# Patient Record
Sex: Female | Born: 1965
Health system: Southern US, Community
[De-identification: ages and names within clinical notes are randomized; demographics above are authoritative.]

## PROBLEM LIST (undated history)

## (undated) DIAGNOSIS — I10 Essential (primary) hypertension: Secondary | ICD-10-CM

## (undated) DIAGNOSIS — I639 Cerebral infarction, unspecified: Secondary | ICD-10-CM

## (undated) DIAGNOSIS — R0600 Dyspnea, unspecified: Secondary | ICD-10-CM

## (undated) DIAGNOSIS — M722 Plantar fascial fibromatosis: Secondary | ICD-10-CM

## (undated) DIAGNOSIS — R112 Nausea with vomiting, unspecified: Secondary | ICD-10-CM

## (undated) DIAGNOSIS — Z9889 Other specified postprocedural states: Secondary | ICD-10-CM

## (undated) DIAGNOSIS — R7303 Prediabetes: Secondary | ICD-10-CM

## (undated) DIAGNOSIS — Z227 Latent tuberculosis: Secondary | ICD-10-CM

## (undated) DIAGNOSIS — D869 Sarcoidosis, unspecified: Secondary | ICD-10-CM

## (undated) HISTORY — DX: Cerebral infarction, unspecified: I63.9

## (undated) HISTORY — PX: ABDOMINAL HYSTERECTOMY: SHX81

---
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
## 1995-08-25 HISTORY — PX: PARTIAL HYSTERECTOMY: SHX80

## 1998-05-22 ENCOUNTER — Other Ambulatory Visit: Admission: RE | Admit: 1998-05-22 | Discharge: 1998-05-22 | Payer: Self-pay | Admitting: Obstetrics

## 1999-09-05 ENCOUNTER — Other Ambulatory Visit: Admission: RE | Admit: 1999-09-05 | Discharge: 1999-09-05 | Payer: Self-pay | Admitting: Gynecology

## 1999-10-31 ENCOUNTER — Encounter (INDEPENDENT_AMBULATORY_CARE_PROVIDER_SITE_OTHER): Payer: Self-pay

## 1999-10-31 ENCOUNTER — Other Ambulatory Visit: Admission: RE | Admit: 1999-10-31 | Discharge: 1999-10-31 | Payer: Self-pay | Admitting: Gynecology

## 1999-12-22 ENCOUNTER — Encounter: Payer: Self-pay | Admitting: Gynecology

## 1999-12-22 ENCOUNTER — Encounter: Admission: RE | Admit: 1999-12-22 | Discharge: 1999-12-22 | Payer: Self-pay | Admitting: Gynecology

## 2000-10-28 ENCOUNTER — Other Ambulatory Visit: Admission: RE | Admit: 2000-10-28 | Discharge: 2000-10-28 | Payer: Self-pay | Admitting: Gynecology

## 2000-12-27 ENCOUNTER — Encounter (INDEPENDENT_AMBULATORY_CARE_PROVIDER_SITE_OTHER): Payer: Self-pay | Admitting: Specialist

## 2000-12-28 ENCOUNTER — Inpatient Hospital Stay (HOSPITAL_COMMUNITY): Admission: RE | Admit: 2000-12-28 | Discharge: 2000-12-29 | Payer: Self-pay | Admitting: Gynecology

## 2001-11-21 ENCOUNTER — Encounter: Payer: Self-pay | Admitting: Emergency Medicine

## 2001-11-21 ENCOUNTER — Emergency Department (HOSPITAL_COMMUNITY): Admission: EM | Admit: 2001-11-21 | Discharge: 2001-11-21 | Payer: Self-pay | Admitting: Emergency Medicine

## 2002-11-19 ENCOUNTER — Emergency Department (HOSPITAL_COMMUNITY): Admission: EM | Admit: 2002-11-19 | Discharge: 2002-11-19 | Payer: Self-pay | Admitting: Emergency Medicine

## 2003-11-05 ENCOUNTER — Emergency Department (HOSPITAL_COMMUNITY): Admission: EM | Admit: 2003-11-05 | Discharge: 2003-11-05 | Payer: Self-pay | Admitting: Family Medicine

## 2003-11-17 ENCOUNTER — Emergency Department (HOSPITAL_COMMUNITY): Admission: AD | Admit: 2003-11-17 | Discharge: 2003-11-17 | Payer: Self-pay | Admitting: Family Medicine

## 2004-06-04 ENCOUNTER — Emergency Department (HOSPITAL_COMMUNITY): Admission: EM | Admit: 2004-06-04 | Discharge: 2004-06-04 | Payer: Self-pay

## 2004-10-20 ENCOUNTER — Emergency Department (HOSPITAL_COMMUNITY): Admission: EM | Admit: 2004-10-20 | Discharge: 2004-10-20 | Payer: Self-pay | Admitting: Family Medicine

## 2005-05-08 ENCOUNTER — Encounter: Admission: RE | Admit: 2005-05-08 | Discharge: 2005-05-08 | Payer: Self-pay | Admitting: Obstetrics and Gynecology

## 2005-11-08 ENCOUNTER — Emergency Department (HOSPITAL_COMMUNITY): Admission: AD | Admit: 2005-11-08 | Discharge: 2005-11-08 | Payer: Self-pay | Admitting: Emergency Medicine

## 2005-11-08 ENCOUNTER — Ambulatory Visit (HOSPITAL_COMMUNITY): Admission: RE | Admit: 2005-11-08 | Discharge: 2005-11-08 | Payer: Self-pay | Admitting: Emergency Medicine

## 2006-04-05 ENCOUNTER — Emergency Department (HOSPITAL_COMMUNITY): Admission: EM | Admit: 2006-04-05 | Discharge: 2006-04-05 | Payer: Self-pay | Admitting: Emergency Medicine

## 2006-09-14 ENCOUNTER — Emergency Department (HOSPITAL_COMMUNITY): Admission: EM | Admit: 2006-09-14 | Discharge: 2006-09-14 | Payer: Self-pay | Admitting: Emergency Medicine

## 2007-03-05 ENCOUNTER — Emergency Department (HOSPITAL_COMMUNITY): Admission: EM | Admit: 2007-03-05 | Discharge: 2007-03-06 | Payer: Self-pay | Admitting: Emergency Medicine

## 2007-04-13 ENCOUNTER — Emergency Department (HOSPITAL_COMMUNITY): Admission: EM | Admit: 2007-04-13 | Discharge: 2007-04-13 | Payer: Self-pay | Admitting: Emergency Medicine

## 2007-04-14 ENCOUNTER — Emergency Department (HOSPITAL_COMMUNITY): Admission: EM | Admit: 2007-04-14 | Discharge: 2007-04-14 | Payer: Self-pay | Admitting: Emergency Medicine

## 2007-04-29 ENCOUNTER — Encounter: Admission: RE | Admit: 2007-04-29 | Discharge: 2007-04-29 | Payer: Self-pay | Admitting: Family Medicine

## 2007-07-13 ENCOUNTER — Emergency Department (HOSPITAL_COMMUNITY): Admission: EM | Admit: 2007-07-13 | Discharge: 2007-07-13 | Payer: Self-pay | Admitting: Family Medicine

## 2007-12-26 ENCOUNTER — Encounter: Admission: RE | Admit: 2007-12-26 | Discharge: 2007-12-26 | Payer: Self-pay | Admitting: Family Medicine

## 2008-01-07 ENCOUNTER — Encounter: Admission: RE | Admit: 2008-01-07 | Discharge: 2008-01-07 | Payer: Self-pay | Admitting: Family Medicine

## 2008-05-31 ENCOUNTER — Emergency Department (HOSPITAL_COMMUNITY): Admission: EM | Admit: 2008-05-31 | Discharge: 2008-05-31 | Payer: Self-pay | Admitting: Internal Medicine

## 2008-08-06 ENCOUNTER — Emergency Department (HOSPITAL_COMMUNITY): Admission: EM | Admit: 2008-08-06 | Discharge: 2008-08-07 | Payer: Self-pay | Admitting: Emergency Medicine

## 2008-09-27 ENCOUNTER — Emergency Department (HOSPITAL_COMMUNITY): Admission: EM | Admit: 2008-09-27 | Discharge: 2008-09-27 | Payer: Self-pay | Admitting: Family Medicine

## 2008-10-03 ENCOUNTER — Emergency Department (HOSPITAL_COMMUNITY): Admission: EM | Admit: 2008-10-03 | Discharge: 2008-10-04 | Payer: Self-pay | Admitting: Emergency Medicine

## 2010-01-09 ENCOUNTER — Emergency Department (HOSPITAL_COMMUNITY): Admission: EM | Admit: 2010-01-09 | Discharge: 2010-01-09 | Payer: Self-pay | Admitting: Emergency Medicine

## 2010-02-27 ENCOUNTER — Other Ambulatory Visit: Admission: RE | Admit: 2010-02-27 | Discharge: 2010-02-27 | Payer: Self-pay | Admitting: Internal Medicine

## 2010-03-26 ENCOUNTER — Ambulatory Visit: Payer: Self-pay | Admitting: Cardiology

## 2010-03-26 ENCOUNTER — Observation Stay (HOSPITAL_COMMUNITY): Admission: EM | Admit: 2010-03-26 | Discharge: 2010-03-27 | Payer: Self-pay | Admitting: Emergency Medicine

## 2010-07-26 ENCOUNTER — Emergency Department (HOSPITAL_COMMUNITY)
Admission: EM | Admit: 2010-07-26 | Discharge: 2010-07-27 | Payer: Self-pay | Source: Home / Self Care | Admitting: Emergency Medicine

## 2010-07-27 ENCOUNTER — Emergency Department (HOSPITAL_COMMUNITY)
Admission: EM | Admit: 2010-07-27 | Discharge: 2010-07-27 | Payer: Self-pay | Source: Home / Self Care | Admitting: Emergency Medicine

## 2010-08-28 ENCOUNTER — Encounter
Admission: RE | Admit: 2010-08-28 | Discharge: 2010-08-28 | Payer: Self-pay | Source: Home / Self Care | Attending: Internal Medicine | Admitting: Internal Medicine

## 2010-09-14 ENCOUNTER — Encounter: Payer: Self-pay | Admitting: Family Medicine

## 2010-09-18 ENCOUNTER — Emergency Department (HOSPITAL_COMMUNITY)
Admission: EM | Admit: 2010-09-18 | Discharge: 2010-09-18 | Payer: Self-pay | Source: Home / Self Care | Admitting: Family Medicine

## 2010-09-18 LAB — POCT URINALYSIS DIPSTICK
Bilirubin Urine: NEGATIVE
Hgb urine dipstick: NEGATIVE
Ketones, ur: NEGATIVE mg/dL
Nitrite: NEGATIVE
Protein, ur: NEGATIVE mg/dL
Specific Gravity, Urine: 1.02 (ref 1.005–1.030)
Urine Glucose, Fasting: NEGATIVE mg/dL
Urobilinogen, UA: 1 mg/dL (ref 0.0–1.0)
pH: 6.5 (ref 5.0–8.0)

## 2010-09-18 LAB — POCT I-STAT, CHEM 8
BUN: 9 mg/dL (ref 6–23)
Calcium, Ion: 1.23 mmol/L (ref 1.12–1.32)
Chloride: 103 mEq/L (ref 96–112)
Creatinine, Ser: 0.8 mg/dL (ref 0.4–1.2)
Glucose, Bld: 95 mg/dL (ref 70–99)
HCT: 45 % (ref 36.0–46.0)
Hemoglobin: 15.3 g/dL — ABNORMAL HIGH (ref 12.0–15.0)
Potassium: 4.3 mEq/L (ref 3.5–5.1)
Sodium: 138 mEq/L (ref 135–145)
TCO2: 26 mmol/L (ref 0–100)

## 2010-09-18 LAB — DIFFERENTIAL
Basophils Absolute: 0 10*3/uL (ref 0.0–0.1)
Basophils Relative: 0 % (ref 0–1)
Eosinophils Absolute: 0 10*3/uL (ref 0.0–0.7)
Eosinophils Relative: 0 % (ref 0–5)
Lymphocytes Relative: 4 % — ABNORMAL LOW (ref 12–46)
Lymphs Abs: 0.5 10*3/uL — ABNORMAL LOW (ref 0.7–4.0)
Monocytes Absolute: 0.4 10*3/uL (ref 0.1–1.0)
Monocytes Relative: 4 % (ref 3–12)
Neutro Abs: 10.3 10*3/uL — ABNORMAL HIGH (ref 1.7–7.7)
Neutrophils Relative %: 92 % — ABNORMAL HIGH (ref 43–77)

## 2010-09-18 LAB — CBC
HCT: 39.9 % (ref 36.0–46.0)
Hemoglobin: 12.8 g/dL (ref 12.0–15.0)
MCH: 22.6 pg — ABNORMAL LOW (ref 26.0–34.0)
MCHC: 32.1 g/dL (ref 30.0–36.0)
MCV: 70.5 fL — ABNORMAL LOW (ref 78.0–100.0)
Platelets: 369 10*3/uL (ref 150–400)
RBC: 5.66 MIL/uL — ABNORMAL HIGH (ref 3.87–5.11)
RDW: 15.3 % (ref 11.5–15.5)
WBC: 11.2 10*3/uL — ABNORMAL HIGH (ref 4.0–10.5)

## 2010-09-19 LAB — GIARDIA/CRYPTOSPORIDIUM SCREEN(EIA)
Cryptosporidium Screen (EIA): NEGATIVE
Giardia Screen - EIA: NEGATIVE

## 2010-09-22 LAB — STOOL CULTURE

## 2010-11-07 LAB — DIFFERENTIAL
Basophils Absolute: 0 10*3/uL (ref 0.0–0.1)
Basophils Relative: 1 % (ref 0–1)
Eosinophils Absolute: 0.2 10*3/uL (ref 0.0–0.7)
Eosinophils Relative: 5 % (ref 0–5)
Lymphocytes Relative: 31 % (ref 12–46)
Lymphs Abs: 1.3 10*3/uL (ref 0.7–4.0)
Monocytes Absolute: 0.5 10*3/uL (ref 0.1–1.0)
Monocytes Relative: 12 % (ref 3–12)
Neutro Abs: 2.1 10*3/uL (ref 1.7–7.7)
Neutrophils Relative %: 52 % (ref 43–77)

## 2010-11-07 LAB — BASIC METABOLIC PANEL
BUN: 7 mg/dL (ref 6–23)
CO2: 27 mEq/L (ref 19–32)
Calcium: 9.2 mg/dL (ref 8.4–10.5)
Chloride: 107 mEq/L (ref 96–112)
Creatinine, Ser: 0.65 mg/dL (ref 0.4–1.2)
GFR calc Af Amer: >60 mL/min (ref >60)
GFR calc non Af Amer: >60 mL/min (ref >60)
Glucose, Bld: 97 mg/dL (ref 70–99)
Potassium: 4.1 mEq/L (ref 3.5–5.1)
Sodium: 138 mEq/L (ref 135–145)

## 2010-11-07 LAB — CARDIAC PANEL(CRET KIN+CKTOT+MB+TROPI)
CK, MB: 0.8 ng/mL (ref 0.3–4.0)
Relative Index: INVALID (ref 0.0–2.5)
Total CK: 46 U/L (ref 7–177)
Troponin I: <0.01 ng/mL (ref 0.00–0.06)

## 2010-11-07 LAB — POCT CARDIAC MARKERS
CKMB, poc: <1 ng/mL — ABNORMAL LOW (ref 1.0–8.0)
CKMB, poc: <1 ng/mL — ABNORMAL LOW (ref 1.0–8.0)
Myoglobin, poc: 54.5 ng/mL (ref 12–200)
Myoglobin, poc: 61 ng/mL (ref 12–200)
Troponin i, poc: <0.05 ng/mL (ref 0.00–0.09)
Troponin i, poc: <0.05 ng/mL (ref 0.00–0.09)

## 2010-11-07 LAB — LIPID PANEL
Cholesterol: 146 mg/dL (ref 0–200)
HDL: 38 mg/dL — ABNORMAL LOW (ref >39)
LDL Cholesterol: 88 mg/dL (ref 0–99)
Total CHOL/HDL Ratio: 3.8 RATIO
Triglycerides: 102 mg/dL (ref <150)
VLDL: 20 mg/dL (ref 0–40)

## 2010-11-07 LAB — D-DIMER, QUANTITATIVE: D-Dimer, Quant: <0.22 ug/mL-FEU (ref 0.00–0.48)

## 2010-11-07 LAB — CBC
HCT: 37.7 % (ref 36.0–46.0)
Hemoglobin: 11.8 g/dL — ABNORMAL LOW (ref 12.0–15.0)
MCH: 22.6 pg — ABNORMAL LOW (ref 26.0–34.0)
MCHC: 31.3 g/dL (ref 30.0–36.0)
MCV: 72.4 fL — ABNORMAL LOW (ref 78.0–100.0)
Platelets: 314 10*3/uL (ref 150–400)
RBC: 5.21 MIL/uL — ABNORMAL HIGH (ref 3.87–5.11)
RDW: 15.3 % (ref 11.5–15.5)
WBC: 4.1 10*3/uL (ref 4.0–10.5)

## 2010-11-07 LAB — CK TOTAL AND CKMB (NOT AT ARMC)
CK, MB: 0.9 ng/mL (ref 0.3–4.0)
Relative Index: INVALID (ref 0.0–2.5)
Total CK: 44 U/L (ref 7–177)

## 2010-11-07 LAB — TROPONIN I: Troponin I: 0.01 ng/mL (ref 0.00–0.06)

## 2010-12-09 LAB — POCT URINALYSIS DIP (DEVICE)
Bilirubin Urine: NEGATIVE
Glucose, UA: NEGATIVE mg/dL
Hgb urine dipstick: NEGATIVE
Ketones, ur: NEGATIVE mg/dL
Nitrite: NEGATIVE
Protein, ur: 30 mg/dL — AB
Specific Gravity, Urine: 1.025 (ref 1.005–1.030)
Urobilinogen, UA: 0.2 mg/dL (ref 0.0–1.0)
pH: 5 (ref 5.0–8.0)

## 2011-01-09 NOTE — Op Note (Signed)
[facility]  Patient:    Jackie Reed, Jackie Reed                        MRN: [id number] Proc. Date: 12/27/00 Adm. Date:  [id number] Attending:  Merrily Pew                           Operative Report  PREOPERATIVE DIAGNOSIS:       Increasing dysmenorrhea, menorrhagia.  POSTOPERATIVE DIAGNOSES:      1. Adenomyosis.                               2. Pelvic adhesions.                               3. Hepatic adhesions.  PROCEDURE:                    1. Laparoscopically assisted vaginal                                  hysterectomy.                               2. Lysis of adhesions.  SURGEON:                      Timothy P. Fontaine, M.D.  ASSISTANTGaetano Hawthorne. Lily Peer, M.D.  ANESTHESIA:                   General.  ESTIMATED BLOOD LOSS:         300 cc.  COMPLICATIONS:                None.  SPECIMEN:                     Uterus.  FINDINGS:                     Anterior cul-de-sac normal. Posterior cul-de-sac with multiple filmy adhesions from sigmoid to lower uterine segment, anterior portion of the cul-de-sac. Uterus enlarged, boggy, consistent with adenomyosis. Right and left ovaries grossly normal. Right and left fallopian tube segments grossly normal, consistent with past history of tubal sterilization. Appendix not visualized. Liver smooth, no abnormalities. Hepatic adhesions noted anteriorly to the anterior abdomen, left undisturbed. Gallbladder grossly normal.  DESCRIPTION OF PROCEDURE:     The patient was taken to the operating room, underwent general endotracheal anesthesia, was placed in low dorsal lithotomy position, received an abdominal, vaginal, and perineal preparation with Betadine scrub and Betadine solution. The bladder was emptied with in-and-out Foley catheterization and a Hulka tenaculum was placed on the cervix. The patient was draped in the usual fashion, and a vertical infraumbilical incision was then  made. The Veress needle was then placed. Its position verified with water. Two liters of carbon dioxide gas were infused without difficulty and the 10 mm laparoscopic trocar was then placed without difficulty; its position verified visually. Of note, the patient does have a protruding umbilicus consistent with a small hernia, although  I could not reduce or produce a hernia-type defect, and the vertical incision was made well below this and not involving this area. Right and left 5 mm suprapubic ports were then placed under direct visualization after transillumination for the vessels without difficulty. Examination of the pelvis and upper abdominal exam was performed with findings noted above. The right round ligament was then identified, and bipolar cauterized, and sharply incised. The right utero-ovarian pedicle was then bipolar cauterized in several places and again, progressively sharply lysed to free up the right ovary. A similar procedure was then carried out on the other side. The anterior bladder flap was sharply incised, and some of the posterior cul-de-sac adhesions were then incised to allow posterior cul-de-sac entry vaginally.  Attention was then turned to the vaginal approach, and the patient was then placed in the dorsal lithotomy position. The weighted speculum was placed within the vagina. The Hulka tenaculum removed and a double-toothed tenaculum placed on the cervix. The cervix was circumferentially injected submucosally with the lidocaine/epinephrine mixture and subsequently sharply incised. The anterior and posterior planes were developed sharply, and the posterior cul-de-sac was then sharply entered without difficulty, and a long weighted speculum placed. The right and left uterosacral ligaments were then identified, clamped, cut, and ligated using 0 Vicryl suture and tagged for future reference. The anterior plane was developed sharply and bluntly, and the anterior  cul-de-sac ultimately entered without difficulty. The paracervical lower uterine segment tissues were then clamped, cut, and ligated using 0 Vicryl suture to initially free the uterus. At this point, it became necessary to core the cervix to allow delivery of the uterus, and the cervix was progressively cored, delivering it along with the inverted uterus without difficulty. The right and left uterine vessels were identified, doubly clamped, cut, and doubly ligated using 0 Vicryl suture. The specimen was removed, and the pelvis inspected showing adequate hemostasis. The long weighted speculum was replaced with the shorter speculum and the posterior vaginal cuff was then run using 0 Vicryl suture in a running interlocking stitch from uterosacral ligament to uterosacral ligament. Adequate hemostasis again was visualized, and the vagina was closed anterior to posterior using 0 Vicryl figure-of-eight interrupted sutures. A Foley catheter was then placed and clear yellow urine noted, and the patient was again placed in the low dorsal lithotomy position, the abdomen re-insufflated, and reinspection of the pelvis with copious irrigation showed adequate hemostasis at all pedicle sites, and at the internal vaginal cuff. The suprapubic ports were removed, gas allowed to escape. Again, all sites were inspected under low pressure situation showing adequate hemostasis, and the infraumbilical port was backed out under direct visualization showing adequate hemostasis and no evidence of hernia formation. The infraumbilical site was then closed using 0 Vicryl suture in interrupted subcutaneous fascial stitch, and all skin incisions were closed using 3-0 Monocryl in interrupted subcuticular stitch. All skin incisions were also injected subcutaneously with 0.25% Marcaine. The patient was placed in the supine position, sterile dressings applied, the patient awakened without difficulty, and was taken to recovery  room in good condition having tolerated the procedure well. DD:  12/27/00 TD:  12/27/00 Job: [id number]

## 2011-01-09 NOTE — Discharge Summary (Signed)
[facility]  Patient:    Jackie Reed, Jackie Reed                        MRN: [id number] Adm. Date:  [id number] Disc. Date: [id number] Attending:  Merrily Pew Dictator:   Antony Contras, N.P.                           Discharge Summary  DISCHARGE DIAGNOSES:          Adenomyosis, pelvic adhesions, hepatic adhesions.  PROCEDURE:                    Laparoscopically assisted vaginal hysterectomy with lysis of adhesions.  HISTORY OF PRESENT ILLNESS:   Patient is a 45 year old gravida 2, para 2 female status post tubal sterilization with a history of increasing menorrhagia and dysmenorrhea.  The patient did undergo ultrasound and sonohysterogram which showed a small leiomyomata, although no significant intracavity abnormalities.  She is also mildly anemic despite iron therapy. Runs hemoglobin at 10-11 range.  She has also tried oral contraceptives in the past and had to stop them due to nausea.  She wishes to proceed with definitive therapy.  HOSPITAL COURSE:              Patient was admitted on Dec 27, 2000 where laparoscopically assisted vaginal hysterectomy and lysis of adhesions was performed by Dr. Audie Box assisted by Dr. Lily Peer under general anesthesia. Findings were posterior cul-de-sac with multiple filmy adhesions from sigmoid to lower uterine segment, anterior portions of the cul-de-sac.  Uterus was enlarged, boggy, consistent with adenomyosis.  Right and left ovaries were normal.  Right and left fallopian tubes normal.  Hepatic adhesions noted anteriorly to the anterior abdomen left undisturbed.  Gallbladder grossly normal.  Postoperative course:  Patient remained afebrile and was able to be discharged in satisfactory condition on her second postoperative day.  LABORATORIES:                 CBC:  Hematocrit 29.2, hemoglobin 9.4, WBC 9.2, platelets 303,000.  DISPOSITION:                  Follow-up in two to six weeks.  Tylox for  pain. Begin iron after normal bowel function returns. DD:  01/24/01 TD:  01/25/01 Job: [id number]

## 2011-01-09 NOTE — H&P (Signed)
[facility]  Patient:    Jackie Reed, Jackie Reed                          MRN: [id number] Adm. Date:  12/27/00 Attending:  Marcial Pacas P. Fontaine, M.D.                         History and Physical  CHIEF COMPLAINT:              Increasing menorrhagia, dysmenorrhea.  HISTORY OF PRESENT ILLNESS:   [age]-year-old G2, P2 female, status post tubal sterilization, with a history of increasing menorrhagia and dysmenorrhea.  The patient bleeds for approximately seven days each month, missing work for several days, and this is interfering with her life.  The patient underwent ultrasound and sonohistogram which showed a small leiomyomata although no significant intracavitary abnormality.  The patient is mildly anemic chronically despite iron therapy and runs a hemoglobin in the 10-11 range.  The patient has been on Anaprox DS as well as Ultram and notes that her periods have continued to be heavy and her pain significant despite this.  She has also had a trial of oral contraceptives in the past and currently had to stop these due to nausea.  She is status post tubal sterilization, and the options for management were reviewed with her to include continued trials of hormonal manipulation versus surgical intervention including both conservative and radical, and the patient wants to proceed with hysterectomy.  Given her history of increasing dysmenorrhea, we will proceed with an LAVH to assess the pelvis for endometriosis.  The patient most recently has been started on oral contraceptives to suppress menses to allow for hemoglobin recovery, and she has been taking iron twice a day.  She initially had started on 1 tablet per day of Loestrin 1/20.  She continues to bleed despite this and was increased to 2 per day and has subsequently ceased bleeding.  PAST MEDICAL HISTORY:         None.  PAST SURGICAL HISTORY:        Tubal sterilization.  CURRENT MEDICATIONS:           Loestrin 1/20 b.i.d. and iron b.i.d.  ALLERGIES:                    None.  FAMILY HISTORY:               Noncontributory.  SOCIAL HISTORY:               No cigarette or alcohol use.  ADMISSION PHYSICAL EXAMINATION:  VITAL SIGNS:                  Afebrile, vital signs are stable.  HEENT:                        Normal.  LUNGS:                        Clear.  CARDIAC:                      Regular rate without rubs, murmurs, or gallops.  ABDOMEN:                      Benign.  PELVIC:  External, BUS, vagina normal.  Cervix normal. Uterus grossly normal in size, nontender.  Adnexa without masses or tenderness.  ASSESSMENT:                   [age]-year-old G2, P2 female, status post tubal sterilization with increasing menorrhagia, dysmenorrhea, refractory to conservative measures of nonsteroidal anti-inflammatories.  Had tried hormonal manipulation with side effects to include nausea which led to discontinuing this.  Options, both conservative and surgical, were discussed, and the patient wants to proceed with hysterectomy.  I reviewed with her the expected intraoperative and postoperative courses for LAVH to include instrumentation, use of laparoscope, and the combined vaginal approach with this.  The patient understands that, at any time during the procedure if it is felt unsafe to proceed with the laparoscopic vaginal approach or if a complication is encountered, we may need to convert this to an abdominal hysterectomy, and she understands and accepts this possibility.  The expected intraoperative and postoperative courses were reviewed, and the risks were discussed, to include the risks of inadvertent injury to internal organs including bowel, bladder, ureters, vessels, and nerves, necessitating major exploratory reparative surgeries and future reparative surgeries including ostomy formation.  The risks of hemorrhage necessitating transfusion and the  risks of transfusion were reviewed to include transfusion reaction, hepatitis, HIV, mad cow disease, and other unknown possibilities.  The risks of infection both internal, generalized, abscess, and wound complications were all reviewed, and the possibility of opening and draining of incisions as well as prolonged antibiotics were discussed, understood, and accepted.  The absolute sterility of hysterectomy was reviewed and accepted.  Sexuality following hysterectomy was discussed with her, and the potential for orgasmic dysfunction as well as persistent dyspareunia and pelvic pain, were discussed, understood, and accepted.  Ovarian conservation issue was reviewed with her, and the possibilities of removing or keeping her ovaries were discussed, and the patient prefers to keep both ovaries if possible, although she does give me permission to remove one or both ovaries if significant disease is encountered.  The patient does understand by keeping both of her ovaries or one of her ovaries that she is at risk for reoperation in the future, both for benign ovarian disease or pain as well as she is at risk for ovarian cancer in the future, and she understands and accepts this and wants to keep both of her ovaries if possible.  The patients questions were answered to her satisfaction, and she is ready to proceed with surgery.  The patient is on two oral contraceptives per day and, given her marginal blood count and the persistent bleeding on one oral contraceptive and now she is not bleeding on two, we have decided to keep her on both up until surgery, and she understands the risks involved with this and agrees with the plan.  The patients questions were answered to her satisfaction, and she is ready to proceed with surgery. DD:  12/22/00 TD:  12/22/00 Job: [id number]

## 2011-02-13 ENCOUNTER — Emergency Department (HOSPITAL_COMMUNITY)
Admission: EM | Admit: 2011-02-13 | Discharge: 2011-02-14 | Disposition: A | Discharge status: Home / Self Care | Payer: 59 | Attending: Emergency Medicine | Admitting: Emergency Medicine

## 2011-02-13 DIAGNOSIS — R55 Syncope and collapse: Secondary | ICD-10-CM | POA: Insufficient documentation

## 2011-02-13 DIAGNOSIS — R079 Chest pain, unspecified: Secondary | ICD-10-CM | POA: Insufficient documentation

## 2011-02-13 DIAGNOSIS — M25519 Pain in unspecified shoulder: Secondary | ICD-10-CM | POA: Insufficient documentation

## 2011-02-13 LAB — COMPREHENSIVE METABOLIC PANEL
ALT: 6 U/L (ref 0–35)
AST: 21 U/L (ref 0–37)
Albumin: 3.6 g/dL (ref 3.5–5.2)
Alkaline Phosphatase: 125 U/L — ABNORMAL HIGH (ref 39–117)
BUN: 10 mg/dL (ref 6–23)
CO2: 27 mEq/L (ref 19–32)
Calcium: 9.5 mg/dL (ref 8.4–10.5)
Chloride: 103 mEq/L (ref 96–112)
Creatinine, Ser: 0.73 mg/dL (ref 0.50–1.10)
GFR calc Af Amer: >60 mL/min (ref >60)
GFR calc non Af Amer: >60 mL/min (ref >60)
Glucose, Bld: 94 mg/dL (ref 70–99)
Potassium: 4.4 mEq/L (ref 3.5–5.1)
Sodium: 135 mEq/L (ref 135–145)
Total Bilirubin: 0.2 mg/dL — ABNORMAL LOW (ref 0.3–1.2)
Total Protein: 7.8 g/dL (ref 6.0–8.3)

## 2011-02-13 LAB — CBC
HCT: 36 % (ref 36.0–46.0)
Hemoglobin: 11.6 g/dL — ABNORMAL LOW (ref 12.0–15.0)
MCH: 22.9 pg — ABNORMAL LOW (ref 26.0–34.0)
MCHC: 32.2 g/dL (ref 30.0–36.0)
MCV: 71 fL — ABNORMAL LOW (ref 78.0–100.0)
Platelets: 362 10*3/uL (ref 150–400)
RBC: 5.07 MIL/uL (ref 3.87–5.11)
RDW: 15.2 % (ref 11.5–15.5)
WBC: 6.1 10*3/uL (ref 4.0–10.5)

## 2011-02-13 LAB — CK TOTAL AND CKMB (NOT AT ARMC)
CK, MB: 1.5 ng/mL (ref 0.3–4.0)
Relative Index: INVALID (ref 0.0–2.5)
Total CK: 68 U/L (ref 7–177)

## 2011-02-13 LAB — TROPONIN I: Troponin I: <0.3 ng/mL (ref <0.30)

## 2011-02-14 LAB — CK TOTAL AND CKMB (NOT AT ARMC)
CK, MB: 1.5 ng/mL (ref 0.3–4.0)
Relative Index: INVALID (ref 0.0–2.5)
Total CK: 56 U/L (ref 7–177)

## 2011-02-14 LAB — TROPONIN I: Troponin I: <0.3 ng/mL (ref <0.30)

## 2011-05-29 LAB — DIFFERENTIAL
Basophils Absolute: 0.1 10*3/uL (ref 0.0–0.1)
Basophils Relative: 1 % (ref 0–1)
Eosinophils Absolute: 0.3 10*3/uL (ref 0.0–0.7)
Eosinophils Relative: 4 % (ref 0–5)
Lymphocytes Relative: 25 % (ref 12–46)
Lymphs Abs: 1.6 10*3/uL (ref 0.7–4.0)
Monocytes Absolute: 0.5 10*3/uL (ref 0.1–1.0)
Monocytes Relative: 7 % (ref 3–12)
Neutro Abs: 4.1 10*3/uL (ref 1.7–7.7)
Neutrophils Relative %: 63 % (ref 43–77)

## 2011-05-29 LAB — CBC
HCT: 37 % (ref 36.0–46.0)
Hemoglobin: 11.7 g/dL — ABNORMAL LOW (ref 12.0–15.0)
MCHC: 31.7 g/dL (ref 30.0–36.0)
MCV: 73.3 fL — ABNORMAL LOW (ref 78.0–100.0)
Platelets: 398 10*3/uL (ref 150–400)
RBC: 5.04 MIL/uL (ref 3.87–5.11)
RDW: 15.4 % (ref 11.5–15.5)
WBC: 6.4 10*3/uL (ref 4.0–10.5)

## 2011-05-29 LAB — URINALYSIS, ROUTINE W REFLEX MICROSCOPIC
Bilirubin Urine: NEGATIVE
Glucose, UA: NEGATIVE mg/dL
Hgb urine dipstick: NEGATIVE
Ketones, ur: NEGATIVE mg/dL
Nitrite: NEGATIVE
Protein, ur: NEGATIVE mg/dL
Specific Gravity, Urine: 1.011 (ref 1.005–1.030)
Urobilinogen, UA: 1 mg/dL (ref 0.0–1.0)
pH: 6 (ref 5.0–8.0)

## 2011-05-29 LAB — POCT CARDIAC MARKERS
CKMB, poc: <1 ng/mL — ABNORMAL LOW (ref 1.0–8.0)
CKMB, poc: <1 ng/mL — ABNORMAL LOW (ref 1.0–8.0)
Myoglobin, poc: 41.6 ng/mL (ref 12–200)
Myoglobin, poc: 44.5 ng/mL (ref 12–200)
Troponin i, poc: <0.05 ng/mL (ref 0.00–0.09)
Troponin i, poc: <0.05 ng/mL (ref 0.00–0.09)

## 2011-05-29 LAB — POCT I-STAT, CHEM 8
BUN: 6 mg/dL (ref 6–23)
Calcium, Ion: 1.27 mmol/L (ref 1.12–1.32)
Chloride: 104 mEq/L (ref 96–112)
Creatinine, Ser: 0.8 mg/dL (ref 0.4–1.2)
Glucose, Bld: 104 mg/dL — ABNORMAL HIGH (ref 70–99)
HCT: 39 % (ref 36.0–46.0)
Hemoglobin: 13.3 g/dL (ref 12.0–15.0)
Potassium: 4.3 mEq/L (ref 3.5–5.1)
Sodium: 142 mEq/L (ref 135–145)
TCO2: 28 mmol/L (ref 0–100)

## 2011-05-29 LAB — POCT PREGNANCY, URINE: Preg Test, Ur: NEGATIVE

## 2012-01-08 ENCOUNTER — Encounter (HOSPITAL_COMMUNITY): Payer: Self-pay

## 2012-01-08 ENCOUNTER — Emergency Department (HOSPITAL_COMMUNITY): Payer: 59

## 2012-01-08 ENCOUNTER — Emergency Department (HOSPITAL_COMMUNITY)
Admission: EM | Admit: 2012-01-08 | Discharge: 2012-01-08 | Disposition: A | Discharge status: Home / Self Care | Payer: 59 | Attending: Emergency Medicine | Admitting: Emergency Medicine

## 2012-01-08 DIAGNOSIS — H538 Other visual disturbances: Secondary | ICD-10-CM | POA: Insufficient documentation

## 2012-01-08 DIAGNOSIS — R51 Headache: Secondary | ICD-10-CM | POA: Insufficient documentation

## 2012-01-08 DIAGNOSIS — M542 Cervicalgia: Secondary | ICD-10-CM | POA: Insufficient documentation

## 2012-01-08 DIAGNOSIS — R110 Nausea: Secondary | ICD-10-CM | POA: Insufficient documentation

## 2012-01-08 DIAGNOSIS — M25519 Pain in unspecified shoulder: Secondary | ICD-10-CM | POA: Insufficient documentation

## 2012-01-08 DIAGNOSIS — G5620 Lesion of ulnar nerve, unspecified upper limb: Secondary | ICD-10-CM

## 2012-01-08 DIAGNOSIS — R209 Unspecified disturbances of skin sensation: Secondary | ICD-10-CM | POA: Insufficient documentation

## 2012-01-08 LAB — BASIC METABOLIC PANEL
BUN: 8 mg/dL (ref 6–23)
CO2: 25 mEq/L (ref 19–32)
Calcium: 8.7 mg/dL (ref 8.4–10.5)
Chloride: 104 mEq/L (ref 96–112)
Creatinine, Ser: 0.64 mg/dL (ref 0.50–1.10)
GFR calc Af Amer: >90 mL/min (ref >90)
GFR calc non Af Amer: >90 mL/min (ref >90)
Glucose, Bld: 80 mg/dL (ref 70–99)
Potassium: 3.7 mEq/L (ref 3.5–5.1)
Sodium: 137 mEq/L (ref 135–145)

## 2012-01-08 LAB — DIFFERENTIAL
Basophils Absolute: 0 10*3/uL (ref 0.0–0.1)
Basophils Relative: 0 % (ref 0–1)
Eosinophils Absolute: 0.2 10*3/uL (ref 0.0–0.7)
Eosinophils Relative: 5 % (ref 0–5)
Lymphocytes Relative: 31 % (ref 12–46)
Lymphs Abs: 1.2 10*3/uL (ref 0.7–4.0)
Monocytes Absolute: 0.4 10*3/uL (ref 0.1–1.0)
Monocytes Relative: 11 % (ref 3–12)
Neutro Abs: 2.1 10*3/uL (ref 1.7–7.7)
Neutrophils Relative %: 54 % (ref 43–77)

## 2012-01-08 LAB — POCT I-STAT TROPONIN I: Troponin i, poc: 0 ng/mL (ref 0.00–0.08)

## 2012-01-08 LAB — CBC
HCT: 37.3 % (ref 36.0–46.0)
Hemoglobin: 11.9 g/dL — ABNORMAL LOW (ref 12.0–15.0)
MCH: 22.5 pg — ABNORMAL LOW (ref 26.0–34.0)
MCHC: 31.9 g/dL (ref 30.0–36.0)
MCV: 70.4 fL — ABNORMAL LOW (ref 78.0–100.0)
Platelets: 388 10*3/uL (ref 150–400)
RBC: 5.3 MIL/uL — ABNORMAL HIGH (ref 3.87–5.11)
RDW: 15 % (ref 11.5–15.5)
WBC: 3.8 10*3/uL — ABNORMAL LOW (ref 4.0–10.5)

## 2012-01-08 MED ORDER — DICLOFENAC SODIUM 75 MG PO TBEC: 75.0000 mg | DELAYED_RELEASE_TABLET | Freq: Two times a day (BID) | ORAL | Status: DC

## 2012-01-08 MED ORDER — DIPHENHYDRAMINE HCL 50 MG/ML IJ SOLN
25.0000 mg | Freq: Once | INTRAMUSCULAR | Status: AC
  Administered 2012-01-08: 25 mg via INTRAVENOUS
  Filled 2012-01-08: qty 1

## 2012-01-08 MED ORDER — DEXAMETHASONE SODIUM PHOSPHATE 10 MG/ML IJ SOLN
10.0000 mg | Freq: Once | INTRAMUSCULAR | Status: AC
  Administered 2012-01-08: 10 mg via INTRAVENOUS
  Filled 2012-01-08: qty 1

## 2012-01-08 MED ORDER — METOCLOPRAMIDE HCL 5 MG/ML IJ SOLN
10.0000 mg | Freq: Once | INTRAMUSCULAR | Status: AC
  Administered 2012-01-08: 10 mg via INTRAVENOUS
  Filled 2012-01-08: qty 2

## 2012-01-08 MED ORDER — SODIUM CHLORIDE 0.9 % IV BOLUS (SEPSIS)
1000.0000 mL | Freq: Once | INTRAVENOUS | Status: AC
  Administered 2012-01-08: 1000 mL via INTRAVENOUS

## 2012-01-08 MED ORDER — METHOCARBAMOL 500 MG PO TABS: 500.0000 mg | ORAL_TABLET | Freq: Two times a day (BID) | ORAL | Status: AC

## 2012-01-08 MED ORDER — KETOROLAC TROMETHAMINE 30 MG/ML IJ SOLN
30.0000 mg | Freq: Once | INTRAMUSCULAR | Status: AC
  Administered 2012-01-08: 30 mg via INTRAVENOUS
  Filled 2012-01-08: qty 1

## 2012-01-08 NOTE — ED Provider Notes (Signed)
History     CSN: [id number]  Arrival date & time 01/08/12  1142   First MD Initiated Contact with Patient 01/08/12 1338     1:57 PM HPI Reports a headache that began yesterday . States pain has been constant and squeezing. Reports h/o migraine but she has never had a HA last so long. Also c/o Blurry vision, nausea, left lateral neck and shoulder pain and tingling in there left 3rd, 4th and 5th fingers and down her arm. Denies weakness, aphasia, ataxia, chest pain or SOB. Denies fever, rash, midline neck pain or injury Patient is a 46 y.o. female presenting with headaches. The history is provided by the patient.  Headache  The current episode started 2 days ago. The problem occurs constantly. The problem has not changed since onset.The headache is associated with an unknown factor. The pain is located in the parietal region. The quality of the pain is described as throbbing. The pain is moderate. The pain radiates to the left neck and left shoulder. Associated symptoms include nausea. Pertinent negatives include no anorexia, no fever, no malaise/fatigue, no chest pressure, no near-syncope, no orthopnea, no palpitations, no syncope, no shortness of breath and no vomiting. She has tried NSAIDs for the symptoms. The treatment provided no relief.    Past Medical History  Diagnosis Date  . Migraine     Past Surgical History  Procedure Date  . Partial hysterectomy   . Foot surgery     Family History  Problem Relation Age of Onset  . Heart failure Mother   . Heart failure Father     History  Substance Use Topics  . Smoking status: Never Smoker   . Smokeless tobacco: Never Used  . Alcohol Use: No    OB History    Grav Para Term Preterm Abortions TAB SAB Ect Mult Living                  Review of Systems  Constitutional: Negative for fever, chills and malaise/fatigue.  HENT: Positive for neck pain (left sided). Negative for congestion, sore throat, rhinorrhea, trouble swallowing,  neck stiffness, postnasal drip and sinus pressure.   Eyes: Positive for visual disturbance.  Respiratory: Negative for cough and shortness of breath.   Cardiovascular: Negative for palpitations, orthopnea, syncope and near-syncope.  Gastrointestinal: Positive for nausea. Negative for vomiting and anorexia.  Musculoskeletal: Negative for back pain.       Arm pain  Neurological: Positive for dizziness, numbness and headaches. Negative for seizures, speech difficulty, weakness and light-headedness.  All other systems reviewed and are negative.    Allergies  Review of patient's allergies indicates no known allergies.  Home Medications   Current Outpatient Rx  Name Route Sig Dispense Refill  . IBUPROFEN 200 MG PO TABS Oral Take 400 mg by mouth every 8 (eight) hours as needed. For pain.      BP 144/85  Pulse 76  Temp(Src) 98.6 F (37 C) (Oral)  Resp 22  SpO2 100%  Physical Exam  Vitals reviewed. Constitutional: She is oriented to person, place, and time. Vital signs are normal. She appears well-developed and well-nourished.  HENT:  Head: Normocephalic and atraumatic.  Right Ear: External ear normal.  Left Ear: External ear normal.  Nose: Nose normal.  Mouth/Throat: Oropharynx is clear and moist. No oropharyngeal exudate.  Eyes: Conjunctivae and EOM are normal. Pupils are equal, round, and reactive to light. Right eye exhibits no discharge. Left eye exhibits no discharge.  Neck: Trachea normal,  normal range of motion and full passive range of motion without pain. Neck supple. No spinous process tenderness and no muscular tenderness present. No rigidity. No edema, no erythema and normal range of motion present. No Brudzinski's sign and no Kernig's sign noted.  Cardiovascular: Normal rate, regular rhythm and normal heart sounds.  Exam reveals no friction rub.   No murmur heard. Pulmonary/Chest: Effort normal and breath sounds normal. She has no wheezes. She has no rhonchi. She has no  rales. She exhibits no tenderness.  Musculoskeletal: Normal range of motion.       Percussion over the ulnar nerve reproduces tingling and numbness feeling in left third fourth and fifth fingers. Patient has full range of motion and normal capillary refill. Normal distal pulses.  Lymphadenopathy:    She has no cervical adenopathy.  Neurological: She is alert and oriented to person, place, and time. No cranial nerve deficit (Tested CN III-XII). She exhibits normal muscle tone (with hand grips). Coordination (no pronator, finger to nose and no nystagmus) normal.  Skin: Skin is warm and dry. No rash noted. No erythema. No pallor.  Psychiatric: She has a normal mood and affect.    ED Course  Procedures  Results for orders placed during the hospital encounter of 01/08/12  CBC      Component Value Range   WBC 3.8 (*) 4.0 - 10.5 (K/uL)   RBC 5.30 (*) 3.87 - 5.11 (MIL/uL)   Hemoglobin 11.9 (*) 12.0 - 15.0 (g/dL)   HCT 14.7  82.9 - 56.2 (%)   MCV 70.4 (*) 78.0 - 100.0 (fL)   MCH 22.5 (*) 26.0 - 34.0 (pg)   MCHC 31.9  30.0 - 36.0 (g/dL)   RDW 13.0  86.5 - 78.4 (%)   Platelets 388  150 - 400 (K/uL)  DIFFERENTIAL      Component Value Range   Neutrophils Relative 54  43 - 77 (%)   Neutro Abs 2.1  1.7 - 7.7 (K/uL)   Lymphocytes Relative 31  12 - 46 (%)   Lymphs Abs 1.2  0.7 - 4.0 (K/uL)   Monocytes Relative 11  3 - 12 (%)   Monocytes Absolute 0.4  0.1 - 1.0 (K/uL)   Eosinophils Relative 5  0 - 5 (%)   Eosinophils Absolute 0.2  0.0 - 0.7 (K/uL)   Basophils Relative 0  0 - 1 (%)   Basophils Absolute 0.0  0.0 - 0.1 (K/uL)  BASIC METABOLIC PANEL      Component Value Range   Sodium 137  135 - 145 (mEq/L)   Potassium 3.7  3.5 - 5.1 (mEq/L)   Chloride 104  96 - 112 (mEq/L)   CO2 25  19 - 32 (mEq/L)   Glucose, Bld 80  70 - 99 (mg/dL)   BUN 8  6 - 23 (mg/dL)   Creatinine, Ser 6.96  0.50 - 1.10 (mg/dL)   Calcium 8.7  8.4 - 29.5 (mg/dL)   GFR calc non Af Amer >90  >90 (mL/min)   GFR calc Af  Amer >90  >90 (mL/min)  POCT I-STAT TROPONIN I      Component Value Range   Troponin i, poc 0.00  0.00 - 0.08 (ng/mL)   Comment 3            Dg Cervical Spine Complete  01/08/2012  *RADIOLOGY REPORT*  Clinical Data: 46 year old female with arm pain.  Head pain.  CERVICAL SPINE - COMPLETE 4+ VIEW  Comparison: 03/27/2010.  Findings: Increase straightening  of cervical lordosis. Cervicothoracic junction alignment is within normal limits. Prevertebral soft tissue contours are stable and within normal limits.  Relatively preserved disc spaces. Bilateral posterior element alignment is within normal limits.  C1-C2 alignment and odontoid process within normal limits.  AP alignment is stable. Lung apices are clear.  IMPRESSION: Stable and negative radiographic appearance of the cervical spine except for straightening of lordosis.  Original Report Authenticated By: Harley Hallmark, M.D.   Ct Head Wo Contrast  01/08/2012  *RADIOLOGY REPORT*  Clinical Data: Left arm pain and tingling.  Headache.  CT HEAD WITHOUT CONTRAST  Technique:  Contiguous axial images were obtained from the base of the skull through the vertex without contrast.  Comparison: CT head without contrast 08/07/2008.  Findings: No acute cortical infarct, hemorrhage, or mass lesion is present.  The ventricles are of normal size.  No significant extra- axial fluid collection is present.  The  Chronic right maxillary sinus opacification is evident.  Chronic anterior right ethmoid and frontal sinus disease is similar to the prior study as well.  The remaining paranasal sinuses are clear. The mastoid air cells are clear.  The osseous skull is intact.  IMPRESSION:  1.  Normal CT appearance of the brain. 2.  Chronic anterior right paranasal sinus disease is similar to the prior exam.  Original Report Authenticated By: Jamesetta Orleans. MATTERN, M.D.   ED ECG REPORT   Date: 01/08/2012  EKG Time: 3:44 PM  Rate: 66  Rhythm: normal sinus rhythm,  unchanged from  previous tracings  Axis: Left  Intervals:none  ST&T Change: none  Narrative Interpretation: Unchanged from 02/13/2011, [facility]    MDM   4:15 PM  Significant improvement with Reglan, Decadron, Benadryl. Reports headache is still a 5/10. Normal imaging and labs. Will order Toradol for additional pain relief patient is ready for discharge. Advise close followup with primary care provider or return to emergency department for worsening symptoms. Low suspicion for CVA. Suspect left arm pain is ulnar nerve impingement.      Thomasene Lot, PA-C 01/08/12 [phone]

## 2012-01-08 NOTE — Discharge Instructions (Signed)
Headache, General, Unknown Cause The specific cause of your headache may not have been found today. There are many causes and types of headache. A few common ones are:  Tension headache.   Migraine.   Infections (examples: dental and sinus infections).   Bone and/or joint problems in the neck or jaw.   Depression.   Eye problems.  These headaches are not life threatening.  Headaches can sometimes be diagnosed by a patient history and a physical exam. Sometimes, lab and imaging studies (such as x-ray and/or CT scan) are used to rule out more serious problems. In some cases, a spinal tap (lumbar puncture) may be requested. There are many times when your exam and tests may be normal on the first visit even when there is a serious problem causing your headaches. Because of that, it is very important to follow up with your doctor or local clinic for further evaluation. FINDING OUT THE RESULTS OF TESTS  If a radiology test was performed, a radiologist will review your results.   You will be contacted by the emergency department or your physician if any test results require a change in your treatment plan.   Not all test results may be available during your visit. If your test results are not back during the visit, make an appointment with your caregiver to find out the results. Do not assume everything is normal if you have not heard from your caregiver or the medical facility. It is important for you to follow up on all of your test results.  HOME CARE INSTRUCTIONS   Keep follow-up appointments with your caregiver, or any specialist referral.   Only take over-the-counter or prescription medicines for pain, discomfort, or fever as directed by your caregiver.   Biofeedback, massage, or other relaxation techniques may be helpful.   Ice packs or heat applied to the head and neck can be used. Do this three to four times per day, or as needed.   Call your doctor if you have any questions or  concerns.   If you smoke, you should quit.  SEEK MEDICAL CARE IF:   You develop problems with medications prescribed.   You do not respond to or obtain relief from medications.   You have a change from the usual headache.   You develop nausea or vomiting.  SEEK IMMEDIATE MEDICAL CARE IF:   If your headache becomes severe.   You have an unexplained oral temperature above 102 F (38.9 C), or as your caregiver suggests.   You have a stiff neck.   You have loss of vision.   You have muscular weakness.   You have loss of muscular control.   You develop severe symptoms different from your first symptoms.   You start losing your balance or have trouble walking.   You feel faint or pass out.  MAKE SURE YOU:   Understand these instructions.   Will watch your condition.   Will get help right away if you are not doing well or get worse.  Document Released: 08/10/2005 Document Revised: 07/30/2011 Document Reviewed: 03/29/2008 [facility] Patient Information 2012 [place], [place].Peripheral Neuropathy Peripheral neuropathy is a common disorder of your nerves resulting from damage. CAUSES  This disorder may be caused by a disease of the nerves or illness. Many neuropathies have well known causes such as:  Diabetes. This is one of the most common causes.   Uremia.   AIDS.   Nutritional deficiencies.   Other causes include mechanical pressures. These may  be from:   Compression.   Injury.   Contusions or bruises.   Fracture or dislocated bones.   Pressure involving the nerves close to the surface. Nerves such as the ulnar, or radial can be injured by prolonged use of crutches.  Other injuries may come from:  Tumor.   Hemorrhage or bleeding into a nerve.   Exposure to cold or radiation.   Certain medicines or toxic substances (rare).   Vascular or collagen disorders such as:   Atherosclerosis.   Systemic lupus erythematosus.   Scleroderma.   Sarcoidosis.     Rheumatoid arthritis.   Polyarteritis nodosa.   A large number of cases are of unknown cause.  SYMPTOMS  Common problems include:  Weakness.   Numbness.   Abnormal sensations (paresthesia) such as:   Burning.   Tickling.   Pricking.   Tingling.   Pain in the arms, hands, legs and/or feet.  TREATMENT  Therapy for this disorder differs depending on the cause. It may vary from medical treatment with medications or physical therapy among others.   For example, therapy for this disorder caused by diabetes involves control of the diabetes.   In cases where a tumor or ruptured disc is the cause, therapy may involve surgery. This would be to remove the tumor or to repair the ruptured disc.   In entrapment or compression neuropathy, treatment may consist of splinting or surgical decompression of the ulnar or median nerves. A common example of entrapment neuropathy is carpal tunnel syndrome. This has become more common because of the increasing use of computers.   Peroneal and radial compression neuropathies may require avoidance of pressure.   Physical therapy and/or splints may be useful in preventing contractures. This is a condition in which shortened muscles around joints cause abnormal and sometimes painful positioning of the joints.  Document Released: 07/31/2002 Document Revised: 04/22/2011 Document Reviewed: 08/10/2005 [facility] Patient Information 2012 [place], [place].

## 2012-01-08 NOTE — ED Notes (Signed)
Patient reports that she began having a headache, blurred vision since yesterday and this AM began having left arm pain, nausea, numbness of her left  fingers.

## 2012-01-08 NOTE — ED Notes (Signed)
EKG given to Dr. Jacubowitz. 

## 2012-01-09 NOTE — ED Provider Notes (Signed)
Medical screening examination/treatment/procedure(s) were performed by non-physician practitioner and as supervising physician I was immediately available for consultation/collaboration.  Raeford Razor, MD 01/09/12 [phone]

## 2012-01-28 ENCOUNTER — Emergency Department (HOSPITAL_COMMUNITY): Admission: EM | Admit: 2012-01-28 | Discharge: 2012-01-29 | Discharge status: Against Medical Advice | Payer: 59

## 2012-09-21 ENCOUNTER — Encounter (HOSPITAL_COMMUNITY): Payer: Self-pay | Admitting: Emergency Medicine

## 2012-09-21 ENCOUNTER — Emergency Department (INDEPENDENT_AMBULATORY_CARE_PROVIDER_SITE_OTHER)
Admission: EM | Admit: 2012-09-21 | Discharge: 2012-09-21 | Disposition: A | Discharge status: Home / Self Care | Payer: Self-pay | Source: Home / Self Care | Attending: Family Medicine | Admitting: Family Medicine

## 2012-09-21 DIAGNOSIS — J069 Acute upper respiratory infection, unspecified: Secondary | ICD-10-CM

## 2012-09-21 MED ORDER — HYDROCODONE-ACETAMINOPHEN 7.5-325 MG/15ML PO SOLN: 15.0000 mL | Freq: Three times a day (TID) | ORAL | Status: DC | PRN

## 2012-09-21 MED ORDER — CETIRIZINE-PSEUDOEPHEDRINE ER 5-120 MG PO TB12: 1.0000 | ORAL_TABLET | Freq: Two times a day (BID) | ORAL | Status: DC

## 2012-09-21 MED ORDER — IBUPROFEN 600 MG PO TABS: 600.0000 mg | ORAL_TABLET | Freq: Three times a day (TID) | ORAL | Status: DC | PRN

## 2012-09-21 NOTE — ED Notes (Signed)
Reports onset of symptoms started Monday 09/19/12.  Symptoms include: aching, chest tightness, headache, no sore throat, no ear pain.  Reports runny nose, cough, denies any phlegm, but coughing makes head and chest hurt.

## 2012-09-21 NOTE — ED Notes (Signed)
No instructions available 

## 2012-09-21 NOTE — ED Notes (Signed)
Patient went to restroom, urine on hold, denies urinary symptoms

## 2012-09-21 NOTE — ED Notes (Signed)
MD at bedside. Provided warm blankets.

## 2012-09-22 ENCOUNTER — Other Ambulatory Visit: Payer: Self-pay | Admitting: Infectious Diseases

## 2012-09-22 ENCOUNTER — Ambulatory Visit
Admission: RE | Admit: 2012-09-22 | Discharge: 2012-09-22 | Disposition: A | Discharge status: Home / Self Care | Payer: No Typology Code available for payment source | Source: Ambulatory Visit | Attending: Infectious Diseases | Admitting: Infectious Diseases

## 2012-09-22 DIAGNOSIS — R7611 Nonspecific reaction to tuberculin skin test without active tuberculosis: Secondary | ICD-10-CM

## 2012-09-25 ENCOUNTER — Emergency Department (HOSPITAL_COMMUNITY)
Admission: EM | Admit: 2012-09-25 | Discharge: 2012-09-25 | Disposition: A | Discharge status: Home / Self Care | Payer: Self-pay | Attending: Emergency Medicine | Admitting: Emergency Medicine

## 2012-09-25 ENCOUNTER — Emergency Department (HOSPITAL_COMMUNITY): Payer: Self-pay

## 2012-09-25 DIAGNOSIS — R52 Pain, unspecified: Secondary | ICD-10-CM | POA: Insufficient documentation

## 2012-09-25 DIAGNOSIS — IMO0001 Reserved for inherently not codable concepts without codable children: Secondary | ICD-10-CM | POA: Insufficient documentation

## 2012-09-25 DIAGNOSIS — Z8679 Personal history of other diseases of the circulatory system: Secondary | ICD-10-CM | POA: Insufficient documentation

## 2012-09-25 DIAGNOSIS — R059 Cough, unspecified: Secondary | ICD-10-CM | POA: Insufficient documentation

## 2012-09-25 DIAGNOSIS — J40 Bronchitis, not specified as acute or chronic: Secondary | ICD-10-CM

## 2012-09-25 DIAGNOSIS — R5381 Other malaise: Secondary | ICD-10-CM | POA: Insufficient documentation

## 2012-09-25 DIAGNOSIS — R5383 Other fatigue: Secondary | ICD-10-CM | POA: Insufficient documentation

## 2012-09-25 DIAGNOSIS — R05 Cough: Secondary | ICD-10-CM | POA: Insufficient documentation

## 2012-09-25 DIAGNOSIS — J209 Acute bronchitis, unspecified: Secondary | ICD-10-CM | POA: Insufficient documentation

## 2012-09-25 DIAGNOSIS — R110 Nausea: Secondary | ICD-10-CM | POA: Insufficient documentation

## 2012-09-25 DIAGNOSIS — R0602 Shortness of breath: Secondary | ICD-10-CM | POA: Insufficient documentation

## 2012-09-25 MED ORDER — PREDNISONE 20 MG PO TABS: ORAL_TABLET | ORAL | Status: DC

## 2012-09-25 MED ORDER — AZITHROMYCIN 250 MG PO TABS: ORAL_TABLET | ORAL | Status: DC

## 2012-09-25 MED ORDER — ALBUTEROL SULFATE HFA 108 (90 BASE) MCG/ACT IN AERS: 2.0000 | INHALATION_SPRAY | RESPIRATORY_TRACT | Status: DC | PRN

## 2012-09-25 NOTE — ED Notes (Signed)
[id number]<YN> Expected date:09/25/12<BR> Expected time: 4:18 PM<BR> Means of arrival:<BR> Comments:<BR> EMS, SHOB, WEAKNESS

## 2012-09-25 NOTE — ED Provider Notes (Signed)
History     CSN: [id number]  Arrival date & time 09/25/12  1630   First MD Initiated Contact with Patient 09/25/12 1638      Chief Complaint  Patient presents with  . URI  . Nausea  . Weakness    (Consider location/radiation/quality/duration/timing/severity/associated sxs/prior treatment) HPI Comments: She comes to the ER for evaluation of generalized malaise, generalized body aches, cough and nausea. Patient reports that she is short of breath. She was seen in urgent care earlier in the week and told she had flulike symptoms. She says she was not given any medications. Symptoms have worsened. She is more short of breath and is experiencing sharp stabbing pains that are severe when she coughs.  Patient is a 47 y.o. female presenting with URI and weakness.  URI The primary symptoms include cough and myalgias.  The myalgias are associated with weakness.  Weakness  Additional symptoms include weakness.    Past Medical History  Diagnosis Date  . Migraine     Past Surgical History  Procedure Date  . Partial hysterectomy   . Foot surgery     Family History  Problem Relation Age of Onset  . Heart failure Mother   . Heart failure Father     History  Substance Use Topics  . Smoking status: Never Smoker   . Smokeless tobacco: Never Used  . Alcohol Use: No    OB History    Grav Para Term Preterm Abortions TAB SAB Ect Mult Living                  Review of Systems  Respiratory: Positive for cough and shortness of breath.   Musculoskeletal: Positive for myalgias.  Neurological: Positive for weakness.  All other systems reviewed and are negative.    Allergies  Review of patient's allergies indicates no known allergies.  Home Medications   Current Outpatient Rx  Name  Route  Sig  Dispense  Refill  . CETIRIZINE-PSEUDOEPHEDRINE ER 5-120 MG PO TB12   Oral   Take 1 tablet by mouth 2 (two) times daily.   10 tablet   0   . HYDROCODONE-ACETAMINOPHEN 7.5-325  MG/15ML PO SOLN   Oral   Take 15 mLs by mouth every 8 (eight) hours as needed for pain or cough.   120 mL   0   . IBUPROFEN 600 MG PO TABS   Oral   Take 1 tablet (600 mg total) by mouth every 8 (eight) hours as needed for pain or fever.   20 tablet   0     BP 180/110  Pulse 90  Temp 99.7 F (37.6 C) (Tympanic)  Resp 24  SpO2 98%  Physical Exam  Constitutional: She is oriented to person, place, and time. She appears well-developed and well-nourished. No distress.  HENT:  Head: Normocephalic and atraumatic.  Right Ear: Hearing normal.  Nose: Nose normal.  Mouth/Throat: Oropharynx is clear and moist and mucous membranes are normal.  Eyes: Conjunctivae normal and EOM are normal. Pupils are equal, round, and reactive to light.  Neck: Normal range of motion. Neck supple.  Cardiovascular: Normal rate, regular rhythm, S1 normal and S2 normal.  Exam reveals no gallop and no friction rub.   No murmur heard. Pulmonary/Chest: Effort normal and breath sounds normal. No respiratory distress. She exhibits no tenderness.  Abdominal: Soft. Normal appearance and bowel sounds are normal. There is no hepatosplenomegaly. There is no tenderness. There is no rebound, no guarding, no tenderness at McBurney's  point and negative Murphy's sign. No hernia.  Musculoskeletal: Normal range of motion.  Neurological: She is alert and oriented to person, place, and time. She has normal strength. No cranial nerve deficit or sensory deficit. Coordination normal. GCS eye subscore is 4. GCS verbal subscore is 5. GCS motor subscore is 6.  Skin: Skin is warm, dry and intact. No rash noted. No cyanosis.  Psychiatric: She has a normal mood and affect. Her speech is normal and behavior is normal. Thought content normal.    ED Course  Procedures (including critical care time)  Labs Reviewed - No data to display Dg Chest 2 View  09/25/2012  *RADIOLOGY REPORT*  Clinical Data: Cough, shortness of breath, fever  CHEST -  2 VIEW  Comparison: 09/22/2012; 04/22/2010; 08/07/2008  Findings: Grossly unchanged cardiac silhouette and mediastinal contours.  Grossly unchanged bibasilar heterogeneous opacities, left greater than right, again favored to represent atelectasis. No new focal airspace opacities.  No definite pleural effusion or pneumothorax.  Unchanged bones.  IMPRESSION: Bibasilar atelectasis without acute cardiopulmonary disease.   Original Report Authenticated By: Tacey Ruiz, MD      Diagnosis: Bronchitis    MDM  This presents to the ER for persistent upper respiratory infection symptoms. Patient has had progressively worsening cough with shortness of breath. She does not have a history of asthma or pulmonary disease. There was no significant bronchospasm on examination here in the ER, room air oxygen saturations are normal. A chest x-ray was obtained which does not show any evidence of pneumonia. Patient is a for outpatient treatment of mucoprurulent bronchitis. She'll be treated with Zithromax, prednisone and bronchodilators.        Gilda Crease, MD 09/25/12 [phone]

## 2012-09-25 NOTE — ED Provider Notes (Signed)
History     CSN: [id number]  Arrival date & time 09/21/12  1107   First MD Initiated Contact with Patient 09/21/12 1113      Chief Complaint  Patient presents with  . URI    (Consider location/radiation/quality/duration/timing/severity/associated sxs/prior treatment) HPI Comments: 47 y/o female non smoker here c/o generalized malaise, headache, runny nose, non productive cough and anterior chest discomfort with coughing. Denies sore throat, fever or chills. No abdominal pain, nausea, vomiting or diarrhea.   Past Medical History  Diagnosis Date  . Migraine     Past Surgical History  Procedure Date  . Partial hysterectomy   . Foot surgery     Family History  Problem Relation Age of Onset  . Heart failure Mother   . Heart failure Father     History  Substance Use Topics  . Smoking status: Never Smoker   . Smokeless tobacco: Never Used  . Alcohol Use: No    OB History    Grav Para Term Preterm Abortions TAB SAB Ect Mult Living                  Review of Systems  Constitutional: Negative for fever, chills, diaphoresis and appetite change.  HENT: Positive for congestion and rhinorrhea. Negative for sore throat.   Respiratory: Positive for cough. Negative for shortness of breath, wheezing and stridor.   Cardiovascular: Negative for chest pain, palpitations and leg swelling.  Gastrointestinal: Negative for nausea, vomiting, abdominal pain and diarrhea.  Skin: Negative for rash.  Neurological: Positive for headaches. Negative for dizziness.    Allergies  Review of patient's allergies indicates no known allergies.  Home Medications   Current Outpatient Rx  Name  Route  Sig  Dispense  Refill  . CETIRIZINE-PSEUDOEPHEDRINE ER 5-120 MG PO TB12   Oral   Take 1 tablet by mouth 2 (two) times daily.   10 tablet   0   . HYDROCODONE-ACETAMINOPHEN 7.5-325 MG/15ML PO SOLN   Oral   Take 15 mLs by mouth every 8 (eight) hours as needed for pain or cough.   120 mL    0   . IBUPROFEN 600 MG PO TABS   Oral   Take 1 tablet (600 mg total) by mouth every 8 (eight) hours as needed for pain or fever.   20 tablet   0     BP 122/59  Pulse 90  Temp 99.5 F (37.5 C) (Oral)  Resp 22  SpO2 99%  Physical Exam  Nursing note and vitals reviewed. Constitutional: She is oriented to person, place, and time. She appears well-developed and well-nourished. No distress.  HENT:  Head: Normocephalic and atraumatic.       Nasal Congestion with erythema and swelling of nasal turbinates, clear rhinorrhea. mild pharyngeal erythema no exudates. No uvula deviation. No trismus. TM's with increased vascular markings, no dullness bilaterally no swelling or bulging   Eyes: Conjunctivae normal and EOM are normal. Pupils are equal, round, and reactive to light. Right eye exhibits no discharge. Left eye exhibits no discharge. No scleral icterus.  Neck: Neck supple. No JVD present. No thyromegaly present.  Cardiovascular: Normal rate, regular rhythm and normal heart sounds.  Exam reveals no gallop and no friction rub.   No murmur heard. Pulmonary/Chest: Effort normal and breath sounds normal. No respiratory distress. She has no wheezes. She has no rales. She exhibits no tenderness.  Abdominal: Soft. There is no tenderness.  Lymphadenopathy:    She has no cervical adenopathy.  Neurological: She is alert and oriented to person, place, and time.  Skin: No rash noted. She is not diaphoretic.    ED Course  Procedures (including critical care time)  Labs Reviewed - No data to display No results found.   1. URI (upper respiratory infection)       MDM  Clinically well. Stable vitas signs, reassuring lung exam. Impress viral URI. Prescribed ibuprofen, zyrtec-D, hydrocodone/acetaminophen.  Supportive care and red flags that should prompt her return to medical attention discussed with patient and provided in writing.          Sharin Grave, MD 09/25/12 1510

## 2012-09-25 NOTE — ED Notes (Addendum)
Pt was evaluated at [facility] on Wednesday for URI. States today she started having SOB, nausea and weakness. EMS states 12lead unremarkable.

## 2012-12-22 ENCOUNTER — Encounter (HOSPITAL_COMMUNITY): Payer: Self-pay

## 2012-12-22 ENCOUNTER — Emergency Department (HOSPITAL_COMMUNITY)
Admission: EM | Admit: 2012-12-22 | Discharge: 2012-12-23 | Disposition: A | Discharge status: Home / Self Care | Payer: Self-pay | Attending: Emergency Medicine | Admitting: Emergency Medicine

## 2012-12-22 DIAGNOSIS — Z9889 Other specified postprocedural states: Secondary | ICD-10-CM | POA: Insufficient documentation

## 2012-12-22 DIAGNOSIS — Z79899 Other long term (current) drug therapy: Secondary | ICD-10-CM | POA: Insufficient documentation

## 2012-12-22 DIAGNOSIS — M79604 Pain in right leg: Secondary | ICD-10-CM

## 2012-12-22 DIAGNOSIS — Z8679 Personal history of other diseases of the circulatory system: Secondary | ICD-10-CM | POA: Insufficient documentation

## 2012-12-22 DIAGNOSIS — M79609 Pain in unspecified limb: Secondary | ICD-10-CM | POA: Insufficient documentation

## 2012-12-22 LAB — CBC
HCT: 34.5 % — ABNORMAL LOW (ref 36.0–46.0)
Hemoglobin: 10.7 g/dL — ABNORMAL LOW (ref 12.0–15.0)
MCH: 21.8 pg — ABNORMAL LOW (ref 26.0–34.0)
MCHC: 31 g/dL (ref 30.0–36.0)
MCV: 70.4 fL — ABNORMAL LOW (ref 78.0–100.0)
Platelets: 298 10*3/uL (ref 150–400)
RBC: 4.9 MIL/uL (ref 3.87–5.11)
RDW: 15.8 % — ABNORMAL HIGH (ref 11.5–15.5)
WBC: 5.3 10*3/uL (ref 4.0–10.5)

## 2012-12-22 MED ORDER — TRAMADOL HCL 50 MG PO TABS
50.0000 mg | ORAL_TABLET | Freq: Once | ORAL | Status: DC
  Filled 2012-12-22: qty 1

## 2012-12-22 MED ORDER — ACETAMINOPHEN 500 MG PO TABS
1000.0000 mg | ORAL_TABLET | Freq: Once | ORAL | Status: AC
  Administered 2012-12-22: 1000 mg via ORAL
  Filled 2012-12-22: qty 2

## 2012-12-22 NOTE — ED Provider Notes (Signed)
History     CSN: [id number]  Arrival date & time 12/22/12  2222   First MD Initiated Contact with Patient 12/22/12 2303      Chief Complaint  Patient presents with  . Leg Pain    HPI  The patient was also leg pain.  The past 2 weeks she has had pain focally about the anterior middle third of her right thigh.  The pain is sharp and burning.  No prior trauma, fall, Notable event. Since onset the pain has been persistent, worse with standing or motion.  Minimal relief with ibuprofen use. No concurrent fever, chills, urinary symptoms, abdominal pain, distal dysesthesia or weakness. She states that she is compliant with her medication.   Past Medical History  Diagnosis Date  . Migraine     Past Surgical History  Procedure Laterality Date  . Partial hysterectomy    . Foot surgery      Family History  Problem Relation Age of Onset  . Heart failure Mother   . Heart failure Father     History  Substance Use Topics  . Smoking status: Never Smoker   . Smokeless tobacco: Never Used  . Alcohol Use: No    OB History   Grav Para Term Preterm Abortions TAB SAB Ect Mult Living                  Review of Systems  All other systems reviewed and are negative.    Allergies  Tramadol  Home Medications   Current Outpatient Rx  Name  Route  Sig  Dispense  Refill  . albuterol (PROVENTIL HFA;VENTOLIN HFA) 108 (90 BASE) MCG/ACT inhaler   Inhalation   Inhale 2 puffs into the lungs every 4 (four) hours as needed for wheezing.   1 Inhaler   0   . ibuprofen (ADVIL,MOTRIN) 600 MG tablet   Oral   Take 1 tablet (600 mg total) by mouth every 8 (eight) hours as needed for pain or fever.   20 tablet   0     BP 145/81  Pulse 80  Temp(Src) 97.8 F (36.6 C) (Oral)  Resp 16  Wt 202 lb (91.627 kg)  SpO2 97%  Physical Exam  Nursing note and vitals reviewed. Constitutional: She is oriented to person, place, and time. She appears well-developed and well-nourished. No  distress.  HENT:  Head: Normocephalic and atraumatic.  Eyes: Conjunctivae and EOM are normal.  Cardiovascular: Normal rate and regular rhythm.   Pulmonary/Chest: Effort normal and breath sounds normal. No stridor. No respiratory distress.  Abdominal: She exhibits no distension.  Musculoskeletal: She exhibits no edema.  Range of motion and strength of both legs is appropriate. The right anterior mid thigh is unremarkable in appearance, though the patient describes mild bulging sensation. There is minimal discomfort with palpation.   Neurological: She is alert and oriented to person, place, and time. She displays no atrophy and no tremor. No cranial nerve deficit or sensory deficit. She exhibits normal muscle tone. She displays no seizure activity. Coordination normal.  Skin: Skin is warm and dry.  Psychiatric: She has a normal mood and affect.    ED Course  Procedures (including critical care time)  Labs Reviewed  CBC  CK  BASIC METABOLIC PANEL   No results found.   No diagnosis found.  12:54 AM Patient in no distress.  I discussed all results as far, including her anemia with her and her family members. We discussed the absence of risk  factors for PE, the absence of notable physical exam findings, or vital sign abnormalities.  Also discussed the need to follow up with her primary care physician and given the patient's endorsement pain.  MDM  Patient presents with several weeks of leg pain.  On exam she is neurovascularly intact, hemodynamically stable, in no distress.  She has no risk factors for thromboembolic processes, the phlebitis is a possibility. Absent any evidence of distress, she was started on analgesics, discharged with primary care followup.        Gerhard Munch, MD 12/23/12 [phone]

## 2012-12-22 NOTE — ED Notes (Signed)
Pt complains of a swollen, red and warm right thigh, no injury noted. She states that it burns.

## 2012-12-23 LAB — BASIC METABOLIC PANEL
BUN: 11 mg/dL (ref 6–23)
CO2: 26 mEq/L (ref 19–32)
Calcium: 9.1 mg/dL (ref 8.4–10.5)
Chloride: 105 mEq/L (ref 96–112)
Creatinine, Ser: 0.69 mg/dL (ref 0.50–1.10)
GFR calc Af Amer: >90 mL/min (ref >90)
GFR calc non Af Amer: >90 mL/min (ref >90)
Glucose, Bld: 98 mg/dL (ref 70–99)
Potassium: 4.1 mEq/L (ref 3.5–5.1)
Sodium: 138 mEq/L (ref 135–145)

## 2012-12-23 LAB — CK: Total CK: 75 U/L (ref 7–177)

## 2012-12-23 MED ORDER — HYDROCODONE-ACETAMINOPHEN 5-325 MG PO TABS: 1.0000 | ORAL_TABLET | Freq: Three times a day (TID) | ORAL | Status: DC | PRN

## 2013-02-28 ENCOUNTER — Emergency Department (HOSPITAL_COMMUNITY)
Admission: EM | Admit: 2013-02-28 | Discharge: 2013-02-28 | Disposition: A | Discharge status: Home / Self Care | Payer: Self-pay | Attending: Emergency Medicine | Admitting: Emergency Medicine

## 2013-02-28 ENCOUNTER — Encounter (HOSPITAL_COMMUNITY): Payer: Self-pay | Admitting: Emergency Medicine

## 2013-02-28 DIAGNOSIS — R05 Cough: Secondary | ICD-10-CM | POA: Insufficient documentation

## 2013-02-28 DIAGNOSIS — R059 Cough, unspecified: Secondary | ICD-10-CM | POA: Insufficient documentation

## 2013-02-28 DIAGNOSIS — J40 Bronchitis, not specified as acute or chronic: Secondary | ICD-10-CM

## 2013-02-28 DIAGNOSIS — Z792 Long term (current) use of antibiotics: Secondary | ICD-10-CM | POA: Insufficient documentation

## 2013-02-28 DIAGNOSIS — J029 Acute pharyngitis, unspecified: Secondary | ICD-10-CM | POA: Insufficient documentation

## 2013-02-28 DIAGNOSIS — Z8679 Personal history of other diseases of the circulatory system: Secondary | ICD-10-CM | POA: Insufficient documentation

## 2013-02-28 DIAGNOSIS — J209 Acute bronchitis, unspecified: Secondary | ICD-10-CM | POA: Insufficient documentation

## 2013-02-28 DIAGNOSIS — Z791 Long term (current) use of non-steroidal anti-inflammatories (NSAID): Secondary | ICD-10-CM | POA: Insufficient documentation

## 2013-02-28 DIAGNOSIS — R062 Wheezing: Secondary | ICD-10-CM | POA: Insufficient documentation

## 2013-02-28 MED ORDER — AZITHROMYCIN 250 MG PO TABS: ORAL_TABLET | ORAL | Status: DC

## 2013-02-28 MED ORDER — HYDROCODONE-HOMATROPINE 5-1.5 MG/5ML PO SYRP: 5.0000 mL | ORAL_SOLUTION | Freq: Four times a day (QID) | ORAL | Status: DC | PRN

## 2013-02-28 MED ORDER — PREDNISONE 20 MG PO TABS: ORAL_TABLET | ORAL | Status: DC

## 2013-02-28 NOTE — ED Notes (Signed)
Pt c/o of sore throat, barky cough since Saturday. Tried to take cough syrup with no relief.

## 2013-02-28 NOTE — ED Provider Notes (Signed)
   History    CSN: [id number] Arrival date & time 02/28/13  0709  First MD Initiated Contact with Patient 02/28/13 [phone]     Chief Complaint  Patient presents with  . Cough  . Sore Throat   (Consider location/radiation/quality/duration/timing/severity/associated sxs/prior Treatment) HPI .Marland Kitchen... sore throat, cough since Saturday.  Feels like throat is swollen.  Also complains of slight wheeze.  No fever, chills, productive sputum.   Severity is mild to moderate.  Past Medical History  Diagnosis Date  . Migraine    Past Surgical History  Procedure Laterality Date  . Partial hysterectomy    . Foot surgery     Family History  Problem Relation Age of Onset  . Heart failure Mother   . Heart failure Father    History  Substance Use Topics  . Smoking status: Never Smoker   . Smokeless tobacco: Never Used  . Alcohol Use: No   OB History   Grav Para Term Preterm Abortions TAB SAB Ect Mult Living                 Review of Systems  All other systems reviewed and are negative.    Allergies  Tramadol  Home Medications   Current Outpatient Rx  Name  Route  Sig  Dispense  Refill  . albuterol (PROVENTIL HFA;VENTOLIN HFA) 108 (90 BASE) MCG/ACT inhaler   Inhalation   Inhale 2 puffs into the lungs every 4 (four) hours as needed for wheezing.   1 Inhaler   0   . DM-Phenylephrine-Acetaminophen (TYLENOL COLD MULTI-SYMPTOM DAY) 10-5-325 MG TABS   Oral   Take by mouth.         Marland Kitchen azithromycin (ZITHROMAX Z-PAK) 250 MG tablet      2 po day one, then 1 daily x 4 days   5 tablet   0   . HYDROcodone-homatropine (HYCODAN) 5-1.5 MG/5ML syrup   Oral   Take 5 mLs by mouth every 6 (six) hours as needed for cough.   120 mL   0   . predniSONE (DELTASONE) 20 MG tablet      3 tabs po day one, then 2 po daily x 4 days   11 tablet   0    BP 138/83  Pulse 72  Temp(Src) 98 F (36.7 C) (Oral)  SpO2 94% Physical Exam  Nursing note and vitals reviewed. Constitutional: She is  oriented to person, place, and time. She appears well-developed and well-nourished.  HENT:  Head: Normocephalic and atraumatic.  Oropharynx erythematous, boggy  Eyes: Conjunctivae and EOM are normal. Pupils are equal, round, and reactive to light.  Neck: Normal range of motion. Neck supple.  Cardiovascular: Normal rate, regular rhythm and normal heart sounds.   Pulmonary/Chest: Effort normal and breath sounds normal.  Abdominal: Soft. Bowel sounds are normal.  Musculoskeletal: Normal range of motion.  Neurological: She is alert and oriented to person, place, and time.  Skin: Skin is warm and dry.  Psychiatric: She has a normal mood and affect.    ED Course  Procedures (including critical care time) Labs Reviewed - No data to display No results found. 1. Pharyngitis   2. Bronchitis     MDM  Patient is nontoxic.  Oral pharyngeal area is slightly edematous.  No peritonsillar abscess.   Rx Zithromax, prednisone, Hycodan cough syrup  Donnetta Hutching, MD 02/28/13 [phone]

## 2013-11-07 ENCOUNTER — Ambulatory Visit: Payer: Self-pay | Admitting: Physician Assistant

## 2013-12-22 ENCOUNTER — Ambulatory Visit: Payer: Self-pay | Admitting: Physician Assistant

## 2014-01-01 ENCOUNTER — Encounter (HOSPITAL_COMMUNITY): Payer: Self-pay | Admitting: Emergency Medicine

## 2014-01-01 ENCOUNTER — Emergency Department (HOSPITAL_COMMUNITY)
Admission: EM | Admit: 2014-01-01 | Discharge: 2014-01-01 | Disposition: A | Discharge status: Home / Self Care | Payer: No Typology Code available for payment source | Attending: Emergency Medicine | Admitting: Emergency Medicine

## 2014-01-01 DIAGNOSIS — M6281 Muscle weakness (generalized): Secondary | ICD-10-CM | POA: Insufficient documentation

## 2014-01-01 DIAGNOSIS — G43909 Migraine, unspecified, not intractable, without status migrainosus: Secondary | ICD-10-CM | POA: Insufficient documentation

## 2014-01-01 DIAGNOSIS — R209 Unspecified disturbances of skin sensation: Secondary | ICD-10-CM | POA: Insufficient documentation

## 2014-01-01 DIAGNOSIS — R5383 Other fatigue: Secondary | ICD-10-CM

## 2014-01-01 DIAGNOSIS — R269 Unspecified abnormalities of gait and mobility: Secondary | ICD-10-CM | POA: Insufficient documentation

## 2014-01-01 DIAGNOSIS — N390 Urinary tract infection, site not specified: Secondary | ICD-10-CM | POA: Insufficient documentation

## 2014-01-01 DIAGNOSIS — R5381 Other malaise: Secondary | ICD-10-CM | POA: Insufficient documentation

## 2014-01-01 DIAGNOSIS — M542 Cervicalgia: Secondary | ICD-10-CM | POA: Insufficient documentation

## 2014-01-01 LAB — CBC
HCT: 39.5 % (ref 36.0–46.0)
Hemoglobin: 12.3 g/dL (ref 12.0–15.0)
MCH: 22.7 pg — ABNORMAL LOW (ref 26.0–34.0)
MCHC: 31.1 g/dL (ref 30.0–36.0)
MCV: 73 fL — ABNORMAL LOW (ref 78.0–100.0)
Platelets: 367 10*3/uL (ref 150–400)
RBC: 5.41 MIL/uL — ABNORMAL HIGH (ref 3.87–5.11)
RDW: 15.3 % (ref 11.5–15.5)
WBC: 4.5 10*3/uL (ref 4.0–10.5)

## 2014-01-01 LAB — COMPREHENSIVE METABOLIC PANEL
ALT: 11 U/L (ref 0–35)
AST: 19 U/L (ref 0–37)
Albumin: 3.7 g/dL (ref 3.5–5.2)
Alkaline Phosphatase: 154 U/L — ABNORMAL HIGH (ref 39–117)
BUN: 9 mg/dL (ref 6–23)
CO2: 23 mEq/L (ref 19–32)
Calcium: 9.3 mg/dL (ref 8.4–10.5)
Chloride: 104 mEq/L (ref 96–112)
Creatinine, Ser: 0.57 mg/dL (ref 0.50–1.10)
GFR calc Af Amer: >90 mL/min (ref >90)
GFR calc non Af Amer: >90 mL/min (ref >90)
Glucose, Bld: 77 mg/dL (ref 70–99)
Potassium: 4.4 mEq/L (ref 3.7–5.3)
Sodium: 139 mEq/L (ref 137–147)
Total Bilirubin: 0.3 mg/dL (ref 0.3–1.2)
Total Protein: 8.2 g/dL (ref 6.0–8.3)

## 2014-01-01 LAB — URINE MICROSCOPIC-ADD ON

## 2014-01-01 LAB — CBG MONITORING, ED: Glucose-Capillary: 77 mg/dL (ref 70–99)

## 2014-01-01 LAB — URINALYSIS, ROUTINE W REFLEX MICROSCOPIC
Bilirubin Urine: NEGATIVE
Glucose, UA: NEGATIVE mg/dL
Hgb urine dipstick: NEGATIVE
Ketones, ur: NEGATIVE mg/dL
Nitrite: POSITIVE — AB
Protein, ur: NEGATIVE mg/dL
Specific Gravity, Urine: 1.012 (ref 1.005–1.030)
Urobilinogen, UA: 1 mg/dL (ref 0.0–1.0)
pH: 6.5 (ref 5.0–8.0)

## 2014-01-01 LAB — TROPONIN I: Troponin I: <0.3 ng/mL (ref <0.30)

## 2014-01-01 LAB — PROTIME-INR
INR: 0.98 (ref 0.00–1.49)
Prothrombin Time: 12.8 seconds (ref 11.6–15.2)

## 2014-01-01 LAB — APTT: aPTT: 34 seconds (ref 24–37)

## 2014-01-01 MED ORDER — SODIUM CHLORIDE 0.9 % IV BOLUS (SEPSIS)
1000.0000 mL | Freq: Once | INTRAVENOUS | Status: AC
  Administered 2014-01-01: 1000 mL via INTRAVENOUS

## 2014-01-01 MED ORDER — DEXAMETHASONE SODIUM PHOSPHATE 10 MG/ML IJ SOLN
10.0000 mg | Freq: Once | INTRAMUSCULAR | Status: AC
  Administered 2014-01-01: 10 mg via INTRAVENOUS
  Filled 2014-01-01: qty 1

## 2014-01-01 MED ORDER — HYDROCODONE-ACETAMINOPHEN 5-325 MG PO TABS: 1.0000 | ORAL_TABLET | ORAL | Status: DC | PRN

## 2014-01-01 MED ORDER — CIPROFLOXACIN HCL 500 MG PO TABS: 500.0000 mg | ORAL_TABLET | Freq: Two times a day (BID) | ORAL | Status: DC

## 2014-01-01 MED ORDER — DIPHENHYDRAMINE HCL 50 MG/ML IJ SOLN
25.0000 mg | Freq: Once | INTRAMUSCULAR | Status: AC
  Administered 2014-01-01: 25 mg via INTRAVENOUS
  Filled 2014-01-01: qty 1

## 2014-01-01 MED ORDER — MORPHINE SULFATE 4 MG/ML IJ SOLN
4.0000 mg | Freq: Once | INTRAMUSCULAR | Status: AC
  Administered 2014-01-01: 4 mg via INTRAVENOUS
  Filled 2014-01-01: qty 1

## 2014-01-01 MED ORDER — ONDANSETRON HCL 4 MG/2ML IJ SOLN
4.0000 mg | Freq: Once | INTRAMUSCULAR | Status: AC
  Administered 2014-01-01: 4 mg via INTRAVENOUS
  Filled 2014-01-01: qty 2

## 2014-01-01 NOTE — ED Notes (Signed)
Patient called out reporting her arm was red. On assessment pt has welts without swelling on her left forearm above the iv. Pt denies pain or itching, new order for benadryl obtained, will continue to monitor

## 2014-01-01 NOTE — Discharge Instructions (Signed)
Recurrent Migraine Headache A migraine headache is an intense, throbbing pain on one or both sides of your head. Recurrent migraines keep coming back. A migraine can last for 30 minutes to several hours. CAUSES  The exact cause of a migraine headache is not always known. However, a migraine may be caused when nerves in the brain become irritated and release chemicals that cause inflammation. This causes pain. Certain things may also trigger migraines, such as:   Alcohol.  Smoking.  Stress.  Menstruation.  Aged cheeses.  Foods or drinks that contain nitrates, glutamate, aspartame, or tyramine.  Lack of sleep.  Chocolate.  Caffeine.  Hunger.  Physical exertion.  Fatigue.  Medicines used to treat chest pain (nitroglycerine), birth control pills, estrogen, and some blood pressure medicines. SYMPTOMS   Pain on one or both sides of your head.  Pulsating or throbbing pain.  Severe pain that prevents daily activities.  Pain that is aggravated by any physical activity.  Nausea, vomiting, or both.  Dizziness.  Pain with exposure to bright lights, loud noises, or activity.  General sensitivity to bright lights, loud noises, or smells. Before you get a migraine, you may get warning signs that a migraine is coming (aura). An aura may include:  Seeing flashing lights.  Seeing bright spots, halos, or zig-zag lines.  Having tunnel vision or blurred vision.  Having feelings of numbness or tingling.  Having trouble talking.  Having muscle weakness. DIAGNOSIS  A recurrent migraine headache is often diagnosed based on:  Symptoms.  Physical examination.  A CT scan or MRI of your head. These imaging tests cannot diagnose migraines, but can help rule out other causes of headaches.  TREATMENT  Medicines may be given for pain and nausea. Medicines can also be given to help prevent recurrent migraines. HOME CARE INSTRUCTIONS  Only take over-the-counter or  prescription medicines for pain or discomfort as directed by your health care provider. The use of long-term narcotics is not recommended.  Lie down in a dark, quiet room when you have a migraine.  Keep a journal to find out what may trigger your migraine headaches. For example, write down:  What you eat and drink.  How much sleep you get.  Any change to your diet or medicines.  Limit alcohol consumption.  Quit smoking if you smoke.  Get 7 9 hours of sleep, or as recommended by your health care provider.  Limit stress.  Keep lights dim if bright lights bother you and make your migraines worse. SEEK MEDICAL CARE IF:   You do not get relief from the medicines given to you.  You have a recurrence of pain. SEEK IMMEDIATE MEDICAL CARE IF:  Your migraine becomes severe.  You have a fever.  You have a stiff neck.  You have loss of vision.  You have muscular weakness or loss of muscle control.  You start losing your balance or have trouble walking.  You feel faint or pass out.  You have severe symptoms that are different from your first symptoms. MAKE SURE YOU:   Understand these instructions.  Will watch your condition.  Will get help right away if you are not doing well or get worse. Document Released: 05/05/2001 Document Revised: 05/31/2013 Document Reviewed: 04/17/2013 [facility] Patient Information 2014 [place].   Urinary Tract Infection Urinary tract infections (UTIs) can develop anywhere along your urinary tract. Your urinary tract is your body's drainage system for removing wastes and extra water. Your urinary tract includes two kidneys, two  ureters, a bladder, and a urethra. Your kidneys are a pair of bean-shaped organs. Each kidney is about the size of your fist. They are located below your ribs, one on each side of your spine. CAUSES Infections are caused by microbes, which are microscopic organisms, including fungi, viruses, and bacteria. These  organisms are so small that they can only be seen through a microscope. Bacteria are the microbes that most commonly cause UTIs. SYMPTOMS  Symptoms of UTIs may vary by age and gender of the patient and by the location of the infection. Symptoms in young women typically include a frequent and intense urge to urinate and a painful, burning feeling in the bladder or urethra during urination. Older women and men are more likely to be tired, shaky, and weak and have muscle aches and abdominal pain. A fever may mean the infection is in your kidneys. Other symptoms of a kidney infection include pain in your back or sides below the ribs, nausea, and vomiting. DIAGNOSIS To diagnose a UTI, your caregiver will ask you about your symptoms. Your caregiver also will ask to provide a urine sample. The urine sample will be tested for bacteria and white blood cells. White blood cells are made by your body to help fight infection. TREATMENT  Typically, UTIs can be treated with medication. Because most UTIs are caused by a bacterial infection, they usually can be treated with the use of antibiotics. The choice of antibiotic and length of treatment depend on your symptoms and the type of bacteria causing your infection. HOME CARE INSTRUCTIONS  If you were prescribed antibiotics, take them exactly as your caregiver instructs you. Finish the medication even if you feel better after you have only taken some of the medication.  Drink enough water and fluids to keep your urine clear or pale yellow.  Avoid caffeine, tea, and carbonated beverages. They tend to irritate your bladder.  Empty your bladder often. Avoid holding urine for long periods of time.  Empty your bladder before and after sexual intercourse.  After a bowel movement, women should cleanse from front to back. Use each tissue only once. SEEK MEDICAL CARE IF:   You have back pain.  You develop a fever.  Your symptoms do not begin to resolve within 3  days. SEEK IMMEDIATE MEDICAL CARE IF:   You have severe back pain or lower abdominal pain.  You develop chills.  You have nausea or vomiting.  You have continued burning or discomfort with urination. MAKE SURE YOU:   Understand these instructions.  Will watch your condition.  Will get help right away if you are not doing well or get worse. Document Released: 05/20/2005 Document Revised: 02/09/2012 Document Reviewed: 09/18/2011 [facility] Patient Information 2014 [place].

## 2014-01-01 NOTE — ED Notes (Signed)
Educated pt to watch area on left forearm. Area now without redness, only small raised area where there are large welts. Pt educated to notify md if area becomes any worse or develops further rash or redness

## 2014-01-01 NOTE — ED Notes (Signed)
Left arm tingling and numb since this am has been there since she woke up she states  No n/v/sob  Has h/a since yesterday and blurred vision feels weak

## 2014-01-01 NOTE — ED Notes (Signed)
PA at bedside for assessment

## 2014-01-01 NOTE — ED Provider Notes (Signed)
CSN: [id number]     Arrival date & time 01/01/14  1102 History   First MD Initiated Contact with Patient 01/01/14 1112     Chief Complaint  Patient presents with  . Arm Pain     (Consider location/radiation/quality/duration/timing/severity/associated sxs/prior Treatment) HPI  Patient presents to the ER with multiple complaints including: migraine headache, neck tenderness/tightness, left arm tingling and generally feeling weak and unsteady. Her symptoms have been intermittent since yesterday morning. This morning around 6:30am they reoccured and have been constant since. She was at work prior to arrival when she started to feel even worse, her coworker reports she tried to stand her up to take her to medical but she was unsteady so they decided to take her to the ED.Marland Kitchen She still continues to feel the symptoms. Denies having any fevers, nausea, vomiting, diarrhea. Denies difficulty speaking. Denies loss of bowel or urine control   Past Medical History  Diagnosis Date  . Migraine    Past Surgical History  Procedure Laterality Date  . Partial hysterectomy    . Foot surgery     Family History  Problem Relation Age of Onset  . Heart failure Mother   . Heart failure Father    History  Substance Use Topics  . Smoking status: Never Smoker   . Smokeless tobacco: Never Used  . Alcohol Use: No   OB History   Grav Para Term Preterm Abortions TAB SAB Ect Mult Living                 Review of Systems   Review of Systems  Gen: no weight loss, fevers, chills, night sweats  Eyes: no discharge or drainage, no occular pain or visual changes, + blurry vision Nose: no epistaxis or rhinorrhea  Mouth: no dental pain, no sore throat  Neck: no neck pain  Lungs:No wheezing, coughing or hemoptysis CV: no chest pain, palpitations, dependent edema or orthopnea  Abd: no abdominal pain, nausea, vomiting, diarrhea GU: no dysuria or gross hematuria  MSK:  No muscle weakness or pain Neuro: +  headache, generalized weakness, left arm weakness. Skin: no rash or wounds Psyche: no complaints    Allergies  Tramadol  Home Medications   Prior to Admission medications   Medication Sig Start Date End Date Taking? Authorizing Provider  albuterol (PROVENTIL HFA;VENTOLIN HFA) 108 (90 BASE) MCG/ACT inhaler Inhale 2 puffs into the lungs every 4 (four) hours as needed for wheezing. 09/25/12  Yes Orpah Greek, MD  ibuprofen (ADVIL,MOTRIN) 200 MG tablet Take 400 mg by mouth every 6 (six) hours as needed for mild pain.    Yes Historical Provider, MD   BP 142/85  Pulse 66  Temp(Src) 98.6 F (37 C)  Resp 17  SpO2 98% Physical Exam  Nursing note and vitals reviewed. Constitutional: She is oriented to person, place, and time. She appears well-developed and well-nourished. No distress.  HENT:  Head: Normocephalic and atraumatic.  Eyes: Conjunctivae and EOM are normal. Pupils are equal, round, and reactive to light.  Neck: Normal range of motion. Neck supple. Muscular tenderness present. No spinous process tenderness present. Normal range of motion present. No Brudzinski's sign and no Kernig's sign noted.  Cardiovascular: Normal rate and regular rhythm.   Pulmonary/Chest: Effort normal.  Abdominal: Soft. Bowel sounds are normal. There is no tenderness.  Neurological: She is alert and oriented to person, place, and time. No cranial nerve deficit or sensory deficit.  Left arm shows mild weakness when compared to the  right. Symmetrical grip strength  Skin: Skin is warm and dry.  Psychiatric: Her mood appears not anxious. She does not exhibit a depressed mood.    ED Course  Procedures (including critical care time) Labs Review Labs Reviewed  CBC - Abnormal; Notable for the following:    RBC 5.41 (*)    MCV 73.0 (*)    MCH 22.7 (*)    All other components within normal limits  COMPREHENSIVE METABOLIC PANEL - Abnormal; Notable for the following:    Alkaline Phosphatase 154 (*)     All other components within normal limits  URINALYSIS, ROUTINE W REFLEX MICROSCOPIC - Abnormal; Notable for the following:    APPearance CLOUDY (*)    Nitrite POSITIVE (*)    Leukocytes, UA TRACE (*)    All other components within normal limits  URINE MICROSCOPIC-ADD ON - Abnormal; Notable for the following:    Squamous Epithelial / LPF FEW (*)    Bacteria, UA MANY (*)    All other components within normal limits  PROTIME-INR  APTT  TROPONIN I  CBG MONITORING, ED    Imaging Review No results found.   EKG Interpretation None      MDM   Final diagnoses:  UTI (lower urinary tract infection)  Migraine headache   I discussed the case with Dr. Dina Rich, stroke vs Complex Migraine. She recommends treating patients headache first, we are out of window for tPA, and see if her neurological deficits resolve.  Head CT will wait for re-eval.  Patient given IV fluids, Decadron, Benadryl, Morphine for headache. The headache completely resolved and he neurological deficits have completely resolved as well. She has  PCP but would prefer to make an appointment with the Neurologist as offered to her. She also incidentally has a UTI which we will treat today with Cipro. Urine culture will be sent out.  48 y.o.Cincere Deprey Smitherman's  with headache. No neurological deficits and normal neuro exam prior to discharge. Patient can walk without abnormal gait. No fever, night sweats, weight loss, h/o cancer, IVDU. RICE protocol and pain medicine indicated and discussed with patient.   Patient Plan 1. Medications: pain medication, abx and usual home medications  2. Treatment: rest, drink plenty of fluids, gentle stretching as discussed, alternate ice and heat  3. Follow Up: Please followup with your primary doctor for discussion of your diagnoses and further evaluation after today's visit; if you do not have a primary care doctor use the resource guide provided to find one   Vital signs are stable at  discharge. Filed Vitals:   01/01/14 1300  BP: 142/85  Pulse: 66  Temp:   Resp: 17    Patient/guardian has voiced understanding and agreed to follow-up with the PCP or specialist.          Linus Mako, PA-C 01/01/14 1420

## 2014-01-01 NOTE — ED Provider Notes (Signed)
Medical screening examination/treatment/procedure(s) were performed by non-physician practitioner and as supervising physician I was immediately available for consultation/collaboration.   EKG Interpretation None       Merryl Hacker, MD 01/01/14 1422

## 2014-01-04 LAB — URINE CULTURE: Colony Count: >=100000

## 2014-01-17 ENCOUNTER — Emergency Department (HOSPITAL_COMMUNITY): Payer: No Typology Code available for payment source

## 2014-01-17 ENCOUNTER — Encounter (HOSPITAL_COMMUNITY): Payer: Self-pay | Admitting: Emergency Medicine

## 2014-01-17 ENCOUNTER — Emergency Department (HOSPITAL_COMMUNITY)
Admission: EM | Admit: 2014-01-17 | Discharge: 2014-01-17 | Disposition: A | Discharge status: Home / Self Care | Payer: No Typology Code available for payment source | Attending: Emergency Medicine | Admitting: Emergency Medicine

## 2014-01-17 DIAGNOSIS — IMO0002 Reserved for concepts with insufficient information to code with codable children: Secondary | ICD-10-CM | POA: Insufficient documentation

## 2014-01-17 DIAGNOSIS — N390 Urinary tract infection, site not specified: Secondary | ICD-10-CM | POA: Insufficient documentation

## 2014-01-17 DIAGNOSIS — Z792 Long term (current) use of antibiotics: Secondary | ICD-10-CM | POA: Insufficient documentation

## 2014-01-17 DIAGNOSIS — R5381 Other malaise: Secondary | ICD-10-CM | POA: Insufficient documentation

## 2014-01-17 DIAGNOSIS — R42 Dizziness and giddiness: Secondary | ICD-10-CM | POA: Insufficient documentation

## 2014-01-17 DIAGNOSIS — L0390 Cellulitis, unspecified: Secondary | ICD-10-CM

## 2014-01-17 DIAGNOSIS — R209 Unspecified disturbances of skin sensation: Secondary | ICD-10-CM | POA: Insufficient documentation

## 2014-01-17 DIAGNOSIS — R202 Paresthesia of skin: Secondary | ICD-10-CM

## 2014-01-17 DIAGNOSIS — Z8679 Personal history of other diseases of the circulatory system: Secondary | ICD-10-CM | POA: Insufficient documentation

## 2014-01-17 DIAGNOSIS — R5383 Other fatigue: Secondary | ICD-10-CM

## 2014-01-17 LAB — CBC
HCT: 37.7 % (ref 36.0–46.0)
Hemoglobin: 11.9 g/dL — ABNORMAL LOW (ref 12.0–15.0)
MCH: 23 pg — ABNORMAL LOW (ref 26.0–34.0)
MCHC: 31.6 g/dL (ref 30.0–36.0)
MCV: 72.9 fL — ABNORMAL LOW (ref 78.0–100.0)
Platelets: 348 10*3/uL (ref 150–400)
RBC: 5.17 MIL/uL — ABNORMAL HIGH (ref 3.87–5.11)
RDW: 15.1 % (ref 11.5–15.5)
WBC: 4.6 10*3/uL (ref 4.0–10.5)

## 2014-01-17 LAB — URINALYSIS, ROUTINE W REFLEX MICROSCOPIC
Bilirubin Urine: NEGATIVE
Glucose, UA: NEGATIVE mg/dL
Hgb urine dipstick: NEGATIVE
Ketones, ur: NEGATIVE mg/dL
Leukocytes, UA: NEGATIVE
Nitrite: POSITIVE — AB
Protein, ur: NEGATIVE mg/dL
Specific Gravity, Urine: 1.014 (ref 1.005–1.030)
Urobilinogen, UA: 0.2 mg/dL (ref 0.0–1.0)
pH: 5.5 (ref 5.0–8.0)

## 2014-01-17 LAB — CBG MONITORING, ED: Glucose-Capillary: 82 mg/dL (ref 70–99)

## 2014-01-17 LAB — COMPREHENSIVE METABOLIC PANEL
ALT: 10 U/L (ref 0–35)
AST: 19 U/L (ref 0–37)
Albumin: 3.8 g/dL (ref 3.5–5.2)
Alkaline Phosphatase: 159 U/L — ABNORMAL HIGH (ref 39–117)
BUN: 7 mg/dL (ref 6–23)
CO2: 24 mEq/L (ref 19–32)
Calcium: 9.7 mg/dL (ref 8.4–10.5)
Chloride: 102 mEq/L (ref 96–112)
Creatinine, Ser: 0.64 mg/dL (ref 0.50–1.10)
GFR calc Af Amer: >90 mL/min (ref >90)
GFR calc non Af Amer: >90 mL/min (ref >90)
Glucose, Bld: 85 mg/dL (ref 70–99)
Potassium: 4.2 mEq/L (ref 3.7–5.3)
Sodium: 139 mEq/L (ref 137–147)
Total Bilirubin: 0.3 mg/dL (ref 0.3–1.2)
Total Protein: 8.1 g/dL (ref 6.0–8.3)

## 2014-01-17 LAB — DIFFERENTIAL
Basophils Absolute: 0 10*3/uL (ref 0.0–0.1)
Basophils Relative: 0 % (ref 0–1)
Eosinophils Absolute: 0.2 10*3/uL (ref 0.0–0.7)
Eosinophils Relative: 4 % (ref 0–5)
Lymphocytes Relative: 32 % (ref 12–46)
Lymphs Abs: 1.5 10*3/uL (ref 0.7–4.0)
Monocytes Absolute: 0.5 10*3/uL (ref 0.1–1.0)
Monocytes Relative: 10 % (ref 3–12)
Neutro Abs: 2.5 10*3/uL (ref 1.7–7.7)
Neutrophils Relative %: 54 % (ref 43–77)

## 2014-01-17 LAB — I-STAT CHEM 8, ED
BUN: 4 mg/dL — ABNORMAL LOW (ref 6–23)
Calcium, Ion: 1.31 mmol/L — ABNORMAL HIGH (ref 1.12–1.23)
Chloride: 101 mEq/L (ref 96–112)
Creatinine, Ser: 0.7 mg/dL (ref 0.50–1.10)
Glucose, Bld: 85 mg/dL (ref 70–99)
HCT: 44 % (ref 36.0–46.0)
Hemoglobin: 15 g/dL (ref 12.0–15.0)
Potassium: 4 mEq/L (ref 3.7–5.3)
Sodium: 143 mEq/L (ref 137–147)
TCO2: 26 mmol/L (ref 0–100)

## 2014-01-17 LAB — URINE MICROSCOPIC-ADD ON

## 2014-01-17 LAB — APTT: aPTT: 34 seconds (ref 24–37)

## 2014-01-17 LAB — PROTIME-INR
INR: 1.12 (ref 0.00–1.49)
Prothrombin Time: 14.2 seconds (ref 11.6–15.2)

## 2014-01-17 LAB — I-STAT TROPONIN, ED: Troponin i, poc: 0 ng/mL (ref 0.00–0.08)

## 2014-01-17 MED ORDER — SULFAMETHOXAZOLE-TMP DS 800-160 MG PO TABS
1.0000 | ORAL_TABLET | Freq: Once | ORAL | Status: AC
  Administered 2014-01-17: 1 via ORAL
  Filled 2014-01-17: qty 1

## 2014-01-17 MED ORDER — SULFAMETHOXAZOLE-TRIMETHOPRIM 800-160 MG PO TABS: 1.0000 | ORAL_TABLET | Freq: Two times a day (BID) | ORAL | Status: DC

## 2014-01-17 MED ORDER — SODIUM CHLORIDE 0.9 % IV BOLUS (SEPSIS)
1000.0000 mL | Freq: Once | INTRAVENOUS | Status: AC
  Administered 2014-01-17: 1000 mL via INTRAVENOUS

## 2014-01-17 NOTE — ED Provider Notes (Signed)
CSN: [id number]     Arrival date & time 01/17/14  1118 History   First MD Initiated Contact with Patient 01/17/14 1139     Chief Complaint  Patient presents with  . Headache  . Extremity Weakness    @EDPCLEARED @ (Consider location/radiation/quality/duration/timing/severity/associated sxs/prior Treatment) The history is provided by the patient.  Jackie Reed is a 48 y.o. female hx of migraines here with headache, dizziness, L arm weakness. She's been having intermittent headaches as well as dizziness and left arm numbness the last several weeks. She came in earlier in the month and was diagnosed with possible complex migraines and was sent home. This morning she woke up and she had a cause of the headache as well as numbness in the left arm. In triage she was noted to have a pronator drift on the left side so cold stroke was activated.    Past Medical History  Diagnosis Date  . Migraine    Past Surgical History  Procedure Laterality Date  . Partial hysterectomy    . Foot surgery     Family History  Problem Relation Age of Onset  . Heart failure Mother   . Heart failure Father    History  Substance Use Topics  . Smoking status: Never Smoker   . Smokeless tobacco: Never Used  . Alcohol Use: No   OB History   Grav Para Term Preterm Abortions TAB SAB Ect Mult Living                 Review of Systems  Musculoskeletal: Positive for extremity weakness.  Neurological: Positive for dizziness, numbness and headaches.  All other systems reviewed and are negative.     Allergies  Morphine and related and Tramadol  Home Medications   Prior to Admission medications   Medication Sig Start Date End Date Taking? Authorizing Provider  albuterol (PROVENTIL HFA;VENTOLIN HFA) 108 (90 BASE) MCG/ACT inhaler Inhale 2 puffs into the lungs every 4 (four) hours as needed for wheezing. 09/25/12   Orpah Greek, MD  ciprofloxacin (CIPRO) 500 MG tablet Take 1 tablet (500 mg total) by  mouth 2 (two) times daily. 01/01/14   Tiffany Marilu Favre, PA-C  HYDROcodone-acetaminophen (NORCO/VICODIN) 5-325 MG per tablet Take 1-2 tablets by mouth every 4 (four) hours as needed. 01/01/14   Tiffany Marilu Favre, PA-C  ibuprofen (ADVIL,MOTRIN) 200 MG tablet Take 400 mg by mouth every 6 (six) hours as needed for mild pain.     Historical Provider, MD   BP 131/67  Pulse 68  Temp(Src) 98 F (36.7 C) (Oral)  Resp 10  Ht 5\' 5"  (1.651 m)  Wt 195 lb (88.451 kg)  BMI 32.45 kg/m2  SpO2 100% Physical Exam  Nursing note and vitals reviewed. Constitutional: She is oriented to person, place, and time. She appears well-developed.  Anxious   HENT:  Head: Normocephalic.  Mouth/Throat: Oropharynx is clear and moist.  Eyes: Conjunctivae and EOM are normal. Pupils are equal, round, and reactive to light.  Neck: Normal range of motion. Neck supple.  Cardiovascular: Normal rate, regular rhythm and normal heart sounds.   Pulmonary/Chest: Effort normal and breath sounds normal. No respiratory distress. She has no wheezes. She has no rales.  Abdominal: Soft. Bowel sounds are normal. She exhibits no distension. There is no tenderness. There is no rebound.  Musculoskeletal: Normal range of motion. She exhibits no edema and no tenderness.  Small area of cellulitis back of L upper arm, no signs of abscess  Neurological: She is alert and oriented to person, place, and time.  Dec sensation L arm. Nl finger to nose bilaterally. Nl strength throughout   Skin: Skin is warm.  Psychiatric: She has a normal mood and affect. Her behavior is normal. Judgment and thought content normal.    ED Course  Procedures (including critical care time) Labs Review Labs Reviewed  CBC - Abnormal; Notable for the following:    RBC 5.17 (*)    Hemoglobin 11.9 (*)    MCV 72.9 (*)    MCH 23.0 (*)    All other components within normal limits  COMPREHENSIVE METABOLIC PANEL - Abnormal; Notable for the following:    Alkaline  Phosphatase 159 (*)    All other components within normal limits  URINALYSIS, ROUTINE W REFLEX MICROSCOPIC - Abnormal; Notable for the following:    APPearance CLOUDY (*)    Nitrite POSITIVE (*)    All other components within normal limits  URINE MICROSCOPIC-ADD ON - Abnormal; Notable for the following:    Squamous Epithelial / LPF MANY (*)    Bacteria, UA MANY (*)    All other components within normal limits  I-STAT CHEM 8, ED - Abnormal; Notable for the following:    BUN 4 (*)    Calcium, Ion 1.31 (*)    All other components within normal limits  PROTIME-INR  APTT  DIFFERENTIAL  CBG MONITORING, ED  I-STAT TROPOININ, ED    Imaging Review Ct Head (brain) Wo Contrast  01/17/2014   CLINICAL DATA:  Left-sided numbness and headaches  EXAM: CT HEAD WITHOUT CONTRAST  TECHNIQUE: Contiguous axial images were obtained from the base of the skull through the vertex without intravenous contrast.  COMPARISON:  01/08/2012  FINDINGS: Bony calvarium is intact. No gross soft tissue abnormality is noted. The ventricles are normal in size and configuration. No findings to suggest acute hemorrhage, acute infarction or space-occupying mass lesion are noted.  IMPRESSION: No acute abnormality noted.  These results were called by telephone at the time of interpretation on 01/17/2014 at 12:03 PM to Dr. Nicole Kindred, who verbally acknowledged these results.   Electronically Signed   By: Inez Catalina M.D.   On: 01/17/2014 12:00   Mr Brain Wo Contrast  01/17/2014   CLINICAL DATA:  48 year old female with dizziness. Headache, left upper extremity tingling. Weakness. Symptoms increasing. Initial encounter.  EXAM: MRI HEAD WITHOUT CONTRAST  TECHNIQUE: Multiplanar, multiecho pulse sequences of the brain and surrounding structures were obtained without intravenous contrast.  COMPARISON:  Head CT without contrast 1149 hr the same day and earlier.  FINDINGS: Cerebral volume is normal. No restricted diffusion to suggest acute  infarction. No midline shift, mass effect, evidence of mass lesion, ventriculomegaly, extra-axial collection or acute intracranial hemorrhage. Cervicomedullary junction and pituitary are within normal limits. Negative visualized cervical spine. Major intracranial vascular flow voids are preserved.  Mild for age nonspecific cerebral white matter T2 and FLAIR hyperintensity (left frontal lobe series 8, image 19, right frontal lobe image 16), in a nonspecific configuration. Otherwise normal gray and white matter signal.  Visible internal auditory structures appear normal. Mastoids are clear. Minor paranasal sinus mucosal thickening. Visualized orbit soft tissues are within normal limits. Visualized scalp soft tissues are within normal limits. Visualized bone marrow signal is within normal limits.  IMPRESSION: 1.  No acute intracranial abnormality. 2. Mild for age nonspecific cerebral white matter signal changes. Differential consideration includes accelerated small vessel ischemia, sequelae of trauma, hypercoagulable state, vasculitis, migraines, prior infection or  demyelination.   Electronically Signed   By: Lars Pinks M.D.   On: 01/17/2014 14:18     EKG Interpretation   Date/Time:  Wednesday Jan 17 2014 11:22:44 EDT Ventricular Rate:  63 PR Interval:  154 QRS Duration: 78 QT Interval:  412 QTC Calculation: 421 R Axis:   38 Text Interpretation:  Normal sinus rhythm Cannot rule out Anterior infarct  , age undetermined Abnormal ECG No significant change since last tracing  Confirmed by YAO  MD, DAVID ([id number]) on 01/17/2014 12:00:50 PM      MDM   Final diagnoses:  None   Jackie Reed is a 48 y.o. female here with intermittent l arm numbness. Likely complex migraine vs parethesias. I doubt stroke or ACS. Neuro at bedside and CT head nl. Recommend limited MRI for further evaluation.   2:44 PM MRI showed no stroke. UA + UTI, recent urine culture resistant to cipro but sensitive to bactrim. Will d/c  home on bactrim for UTI and cellulitis. She has chronic dizziness but EKG unremarkable, trop neg and MRI unremarkable. Can get further workup outpatient. Also will refer to neuro.    Wandra Arthurs, MD 01/17/14 [phone]

## 2014-01-17 NOTE — ED Notes (Signed)
Pt to ED c/o headache, dizziness, and L arm weakness. Headache x 2 weeks; reports seen in ED previously for the same.  L arm weakness noted after pt arrived to work this morning approximately 8:30am. Reports dizziness when changing positions. Speech clear; L arm and L leg drift present on assessment

## 2014-01-17 NOTE — Code Documentation (Signed)
Code Stroke called at 1141, patient arrived via private vehicle.  Labs drawn, head CT done.  Patient states that she has had a low grade HA for the past 2 weeks and occasional symptoms of dizziness, "room spinning".  Last night she was last known well when she went to bed at 1130pm.  This morning when she awoke at 0730 she felt dizzy.  About 0830am while at work she began to feel worse.  Her dizziness had increased and her left arm felt "funny" and she had difficulty gripping anything with her right hand.  NIHSS 1, decreased sensory on left arm.  She continues to feel dizzy with any movement and states that her left arm is "heavy", no drift.  She also describes some blurred vision no double vision.

## 2014-01-17 NOTE — Discharge Instructions (Signed)
Take bactrim for 10 days.   Follow up with your doctor and with a neurologist.   Return to ER if you have fever, worse dizziness, numbness, weakness.

## 2014-01-17 NOTE — ED Notes (Signed)
Patient returned from MRI. Placed back on the monitor. Pt made comfortable.

## 2014-01-17 NOTE — ED Notes (Signed)
Pt return from MRI awake alert. Prepare for discharge

## 2014-01-17 NOTE — Consult Note (Signed)
Referring Physician: Darl Householder    Chief Complaint: Weakness and numbness involving right upper extremity as well as dizziness.  HPI: Jackie Reed is an 48 y.o. female history of migraine headaches presenting with weakness and numbness involving left upper extremity and feeling of dizziness. Dizziness started last night. She woke up with numbness weakness involving left upper extremity this morning. She had no deficits when she went to bed at 11:30 last night. She said no changes in speech. She's had no facial symptoms nor left lower extremity symptoms. CT scan of her head showed no acute intracranial abnormality. NIH stroke score was 1 (left upper extremity numbness).  LSN: 11:30 PM on 01/16/2014 tPA Given: No: Minimal deficits MRankin: 0  Past Medical History  Diagnosis Date  . Migraine     Family History  Problem Relation Age of Onset  . Heart failure Mother   . Heart failure Father      Medications: I have reviewed the patient's current medications.  ROS: History obtained from the patient  General ROS: negative for - chills, fatigue, fever, night sweats, weight gain or weight loss Psychological ROS: negative for - behavioral disorder, hallucinations, memory difficulties, mood swings or suicidal ideation Ophthalmic ROS: negative for - blurry vision, double vision, eye pain or loss of vision ENT ROS: negative for - epistaxis, nasal discharge, oral lesions, sore throat, tinnitus or vertigo Allergy and Immunology ROS: negative for - hives or itchy/watery eyes Hematological and Lymphatic ROS: negative for - bleeding problems, bruising or swollen lymph nodes Endocrine ROS: negative for - galactorrhea, hair pattern changes, polydipsia/polyuria or temperature intolerance Respiratory ROS: negative for - cough, hemoptysis, shortness of breath or wheezing Cardiovascular ROS: negative for - chest pain, dyspnea on exertion, edema or irregular heartbeat Gastrointestinal ROS: negative for -  abdominal pain, diarrhea, hematemesis, nausea/vomiting or stool incontinence Genito-Urinary ROS: negative for - dysuria, hematuria, incontinence or urinary frequency/urgency Musculoskeletal ROS: negative for - joint swelling or muscular weakness Neurological ROS: as noted in HPI Dermatological ROS: negative for rash and skin lesion changes  Physical Examination: Blood pressure 131/67, pulse 68, temperature 98 F (36.7 C), temperature source Oral, resp. rate 10, height 5\' 5"  (1.651 m), weight 88.451 kg (195 lb), SpO2 100.00%.  Neurologic Examination: Mental Status: Alert, oriented, moderately anxious.  Speech fluent without evidence of aphasia. Able to follow commands without difficulty. Cranial Nerves: II-Visual fields were normal. III/IV/VI-Pupils were equal and reacted. Extraocular movements were full and conjugate.    V/VII-no facial numbness and no facial weakness. VIII-normal. X-normal speech with no dysarthria. Motor: 5/5 bilaterally with normal tone and bulk Sensory: Reduced perception of tactile sensation over left upper extremity compared to right upper extremity, as well as slightly more pronounced numbness over the left hand involving the left ulnar nerve distribution. Deep Tendon Reflexes: 1+ in upper extremities and trace only at the knees and ankles, and symmetric. Plantars: Mute bilaterally Cerebellar: Normal finger-to-nose testing.  Ct Head (brain) Wo Contrast  01/17/2014   CLINICAL DATA:  Left-sided numbness and headaches  EXAM: CT HEAD WITHOUT CONTRAST  TECHNIQUE: Contiguous axial images were obtained from the base of the skull through the vertex without intravenous contrast.  COMPARISON:  01/08/2012  FINDINGS: Bony calvarium is intact. No gross soft tissue abnormality is noted. The ventricles are normal in size and configuration. No findings to suggest acute hemorrhage, acute infarction or space-occupying mass lesion are noted.  IMPRESSION: No acute abnormality noted.   These results were called by telephone at the time of  interpretation on 01/17/2014 at 12:03 PM to Dr. Nicole Kindred, who verbally acknowledged these results.   Electronically Signed   By: Inez Catalina M.D.   On: 01/17/2014 12:00   Mr Brain Wo Contrast  01/17/2014   CLINICAL DATA:  48 year old female with dizziness. Headache, left upper extremity tingling. Weakness. Symptoms increasing. Initial encounter.  EXAM: MRI HEAD WITHOUT CONTRAST  TECHNIQUE: Multiplanar, multiecho pulse sequences of the brain and surrounding structures were obtained without intravenous contrast.  COMPARISON:  Head CT without contrast 1149 hr the same day and earlier.  FINDINGS: Cerebral volume is normal. No restricted diffusion to suggest acute infarction. No midline shift, mass effect, evidence of mass lesion, ventriculomegaly, extra-axial collection or acute intracranial hemorrhage. Cervicomedullary junction and pituitary are within normal limits. Negative visualized cervical spine. Major intracranial vascular flow voids are preserved.  Mild for age nonspecific cerebral white matter T2 and FLAIR hyperintensity (left frontal lobe series 8, image 19, right frontal lobe image 16), in a nonspecific configuration. Otherwise normal gray and white matter signal.  Visible internal auditory structures appear normal. Mastoids are clear. Minor paranasal sinus mucosal thickening. Visualized orbit soft tissues are within normal limits. Visualized scalp soft tissues are within normal limits. Visualized bone marrow signal is within normal limits.  IMPRESSION: 1.  No acute intracranial abnormality. 2. Mild for age nonspecific cerebral white matter signal changes. Differential consideration includes accelerated small vessel ischemia, sequelae of trauma, hypercoagulable state, vasculitis, migraines, prior infection or demyelination.   Electronically Signed   By: Lars Pinks M.D.   On: 01/17/2014 14:18    Assessment: 48 y.o. female presenting with numbness and  weakness involving left upper extremity with no weakness demonstrated on examination and subjective numbness involving left upper extremity. Patient appears to have a left ulnar nerve distribution of numbness which is slightly more pronounced. No objective signs of acute stroke or TIA were noted.  Stroke Risk Factors - none  Plan: 1. Limited MRI of the brain to rule out possible acute ischemic lesion. 2. No further neurodiagnostic studies indicated that no further neurological intervention is indicated if MRI study is unremarkable; admission and stroke workup if MRI shows signs of acute stroke, however. 3. Aspirin 81 mg per day.   C.R. Nicole Kindred, MD [facility] [phone]  01/17/2014, 2:50 PM

## 2014-01-17 NOTE — ED Notes (Signed)
Pt remains in mri.

## 2014-04-03 ENCOUNTER — Other Ambulatory Visit: Payer: Self-pay | Admitting: Internal Medicine

## 2014-04-03 DIAGNOSIS — R748 Abnormal levels of other serum enzymes: Secondary | ICD-10-CM

## 2014-04-12 ENCOUNTER — Other Ambulatory Visit: Payer: No Typology Code available for payment source

## 2014-04-16 ENCOUNTER — Ambulatory Visit
Admission: RE | Admit: 2014-04-16 | Discharge: 2014-04-16 | Disposition: A | Discharge status: Home / Self Care | Payer: No Typology Code available for payment source | Source: Ambulatory Visit | Attending: Internal Medicine | Admitting: Internal Medicine

## 2014-04-16 DIAGNOSIS — R748 Abnormal levels of other serum enzymes: Secondary | ICD-10-CM

## 2014-04-26 ENCOUNTER — Emergency Department (HOSPITAL_COMMUNITY)
Admission: EM | Admit: 2014-04-26 | Discharge: 2014-04-26 | Disposition: A | Discharge status: Home / Self Care | Payer: No Typology Code available for payment source | Attending: Emergency Medicine | Admitting: Emergency Medicine

## 2014-04-26 ENCOUNTER — Encounter (HOSPITAL_COMMUNITY): Payer: Self-pay | Admitting: Emergency Medicine

## 2014-04-26 DIAGNOSIS — M7989 Other specified soft tissue disorders: Secondary | ICD-10-CM | POA: Insufficient documentation

## 2014-04-26 DIAGNOSIS — Z9889 Other specified postprocedural states: Secondary | ICD-10-CM | POA: Insufficient documentation

## 2014-04-26 DIAGNOSIS — Z8669 Personal history of other diseases of the nervous system and sense organs: Secondary | ICD-10-CM | POA: Insufficient documentation

## 2014-04-26 DIAGNOSIS — Z79899 Other long term (current) drug therapy: Secondary | ICD-10-CM | POA: Insufficient documentation

## 2014-04-26 DIAGNOSIS — M25474 Effusion, right foot: Secondary | ICD-10-CM

## 2014-04-26 HISTORY — DX: Plantar fascial fibromatosis: M72.2

## 2014-04-26 NOTE — ED Provider Notes (Signed)
CSN: [id number]     Arrival date & time 04/26/14  2141 History   First MD Initiated Contact with Patient 04/26/14 2236     Chief Complaint  Patient presents with  . Foot Swelling     (Consider location/radiation/quality/duration/timing/severity/associated sxs/prior Treatment) HPI Comments: The patient is a 48 year old female presents emergency department chief complaint of right foot swelling and paresthesias worsening over the past week and half. The patient reports head a plantar fibroma is removed by Dr. Sharol Given a week and half ago. She reports she was recently evaluated by him 2 days ago for suture removal. She reports she discussed paresthesias in right toes. She reports increase in pain and swelling since she also complains of paresthesias to the plantar surface of her foot. She reports she was compliant with her [employer], since Tuesday, she reports sitting at work with elevating her foot at night. Since reciving the cam walker the patient reports increase in ambulation. She reports taking Aleve intermittently. PCP: Jani Gravel, MD Ortho: Dr. Sharol Given   The history is provided by the patient. No language interpreter was used.    Past Medical History  Diagnosis Date  . Migraine   . Plantar fibromatosis    Past Surgical History  Procedure Laterality Date  . Partial hysterectomy    . Foot surgery     Family History  Problem Relation Age of Onset  . Heart failure Mother   . Heart failure Father    History  Substance Use Topics  . Smoking status: Never Smoker   . Smokeless tobacco: Never Used  . Alcohol Use: No   OB History   Grav Para Term Preterm Abortions TAB SAB Ect Mult Living                 Review of Systems  Constitutional: Negative for fever and chills.  Cardiovascular: Negative for leg swelling.  Musculoskeletal: Positive for joint swelling.  Neurological: Positive for numbness. Negative for weakness.      Allergies  Morphine and related and Tramadol  Home  Medications   Prior to Admission medications   Medication Sig Start Date End Date Taking? Authorizing Provider  HYDROcodone-acetaminophen (NORCO/VICODIN) 5-325 MG per tablet Take 1-2 tablets by mouth every 6 (six) hours as needed for moderate pain.   Yes Historical Provider, MD  losartan (COZAAR) 25 MG tablet Take 12.5 mg by mouth daily.   Yes Historical Provider, MD   BP 133/84  Pulse 69  Temp(Src) 98 F (36.7 C) (Oral)  Resp 16  Ht 5\' 2"  (1.575 m)  Wt 211 lb (95.709 kg)  BMI 38.58 kg/m2  SpO2 100% Physical Exam  Nursing note and vitals reviewed. Constitutional: She is oriented to person, place, and time. She appears well-developed and well-nourished. No distress.  HENT:  Head: Normocephalic and atraumatic.  Neck: Neck supple.  Pulmonary/Chest: Effort normal. No respiratory distress.  Musculoskeletal:  9 cm healing linear wound to left plantar surface. No surrouding erythema, no drainage with pressure. Decrease sensation to sharp touch to distal plantar foot, sensation to 5 toes to sharp and light touch intact. Cap refill less than 2 seconds. Reports sensation to light touch around the incision. No swelling to calf. Negative Homan's sign.  Neurological: She is alert and oriented to person, place, and time.  Skin: Skin is warm and dry. She is not diaphoretic.  Psychiatric: She has a normal mood and affect. Her behavior is normal.    ED Course  LACERATION REPAIR Date/Time: 04/26/2014 11:39  AM Performed by: Harvie Heck Authorized by: Harvie Heck Consent: Verbal consent obtained. Risks and benefits: risks, benefits and alternatives were discussed Consent given by: patient Patient understanding: patient states understanding of the procedure being performed Required items: required blood products, implants, devices, and special equipment available Patient identity confirmed: verbally with patient Time out: Immediately prior to procedure a "time out" was called to verify the  correct patient, procedure, equipment, support staff and site/side marked as required. Body area: lower extremity Location details: right foot Laceration length: 9 cm Foreign bodies: no foreign bodies Debridement: none Degree of undermining: none Skin closure: Steri-Strips Dressing: 4x4 sterile gauze Patient tolerance: Patient tolerated the procedure well with no immediate complications.   (including critical care time) Labs Review Labs Reviewed - No data to display  Imaging Review No results found.   EKG Interpretation None      MDM   Final diagnoses:  Swelling of foot joint, right  History of foot surgery   Patient presents with right foot paresthesias and increase in swelling over the past two days. Patient has good cap refill, normal sensation in her feet of mild swelling and no signs of infection, wound clean and dry no surrounding erythema or drainage. Patient initially complained of decreased sensation however she reports to clean with cleaning and applying the Steri-Strips to the area previously with decreased sensation. Steri-Strips placed, Ace wrap ordered. Plan to followup with Dr. Sharol Given later this week for complaints discomfort likely do to increase swelling and increased ambulation advised patient to elevate above heart, ice and take anti-inflammatories. Discussed treatment plan with the patient. Return precautions given. Reports understanding and no other concerns at this time.  Patient is stable for discharge at this time.    Harvie Heck, PA-C 04/27/14 [phone]

## 2014-04-26 NOTE — ED Notes (Signed)
Pt had surgery on her foot to remove plantar fibromas, she went Tuesday for suture removal and she's been having increased swelling and numbness in that same foot.

## 2014-04-26 NOTE — Discharge Instructions (Signed)
Call your orthopedist for further evaluation of your foot swelling and numbness. Call for a follow up appointment with a Family or Primary Care Provider.  Return if Symptoms worsen.   Take medication as prescribed.  Elevate your foot above your heart when you're not walking or standing. Ice yoru foot while keeping it elevated 3-4 times a day. Continue to wear your Cam Walker/boot while walking. Keep your wound clean and dry.

## 2014-04-29 NOTE — ED Provider Notes (Signed)
Medical screening examination/treatment/procedure(s) were performed by non-physician practitioner and as supervising physician I was immediately available for consultation/collaboration.  Leota Jacobsen, MD 04/29/14 [phone]

## 2014-05-23 ENCOUNTER — Encounter (HOSPITAL_COMMUNITY): Payer: Self-pay | Admitting: Emergency Medicine

## 2014-05-23 ENCOUNTER — Emergency Department (HOSPITAL_COMMUNITY)
Admission: EM | Admit: 2014-05-23 | Discharge: 2014-05-23 | Disposition: A | Discharge status: Home / Self Care | Payer: No Typology Code available for payment source | Attending: Emergency Medicine | Admitting: Emergency Medicine

## 2014-05-23 DIAGNOSIS — G8929 Other chronic pain: Secondary | ICD-10-CM | POA: Diagnosis not present

## 2014-05-23 DIAGNOSIS — G43909 Migraine, unspecified, not intractable, without status migrainosus: Secondary | ICD-10-CM | POA: Insufficient documentation

## 2014-05-23 DIAGNOSIS — M79609 Pain in unspecified limb: Secondary | ICD-10-CM | POA: Insufficient documentation

## 2014-05-23 DIAGNOSIS — Z79899 Other long term (current) drug therapy: Secondary | ICD-10-CM | POA: Diagnosis not present

## 2014-05-23 DIAGNOSIS — B353 Tinea pedis: Secondary | ICD-10-CM | POA: Insufficient documentation

## 2014-05-23 DIAGNOSIS — M79671 Pain in right foot: Secondary | ICD-10-CM

## 2014-05-23 MED ORDER — MICONAZOLE NITRATE 2 % EX CREA: 1.0000 "application " | TOPICAL_CREAM | Freq: Two times a day (BID) | CUTANEOUS | Status: DC

## 2014-05-23 NOTE — ED Notes (Signed)
Attempted to discharge pt. Requested assisted for discharge of this pt. Upon arrival to room. Pt not in room. Witnessed by Edison Nasuti NT. Made attempt to look for pt in ED without success.

## 2014-05-23 NOTE — ED Notes (Signed)
Pt states she had foot surgery at the end of august.  Pt states that since Saturday, she has been having pain in the top of her foot with associated pain all the way up her leg to her knee.  States that it has been swelling and "swells worse" when she props it up.

## 2014-05-23 NOTE — ED Notes (Signed)
MD at bedside. 

## 2014-05-23 NOTE — ED Provider Notes (Signed)
CSN: [id number]     Arrival date & time 05/23/14  0704 History   First MD Initiated Contact with Patient 05/23/14 0720     Chief Complaint  Patient presents with  . Foot Pain  . Leg Pain  . Leg Swelling     (Consider location/radiation/quality/duration/timing/severity/associated sxs/prior Treatment) HPI Patient reports that she continues to have problems with swelling and pain in her right forefoot. She had a plantar fibroma removed from the right forefoot. This was done in August. The patient reports that she saw Dr. Sharol Given 5 days ago, at that time she discussed the paresthesia with him but today she presents reporting that the swelling seems worse. The swelling that she identifies as around her forefoot and toes. She reports that she gets painful aching in that area as well as numbness in the toes. She reports that the pain radiates up her foot and into her leg. It's worse with weightbearing. She reports her the swelling is worse as she elevates it. There is physical examination in no chronic athlete's foot, the patient portion had been up is to put some cream on it but she has not started any treatment for this point time.  Past Medical History  Diagnosis Date  . Migraine   . Plantar fibromatosis    Past Surgical History  Procedure Laterality Date  . Partial hysterectomy    . Foot surgery     Family History  Problem Relation Age of Onset  . Heart failure Mother   . Heart failure Father    History  Substance Use Topics  . Smoking status: Never Smoker   . Smokeless tobacco: Never Used  . Alcohol Use: No   OB History   Grav Para Term Preterm Abortions TAB SAB Ect Mult Living                 Review of Systems  Constitutional: No fevers no chills or general illness Chest: No shortness of breath or chest pain  Allergies  Morphine and related and Tramadol  Home Medications   Prior to Admission medications   Medication Sig Start Date End Date Taking? Authorizing Provider   gabapentin (NEURONTIN) 100 MG capsule Take 100 mg by mouth 3 (three) times daily.   Yes Historical Provider, MD  HYDROcodone-acetaminophen (NORCO/VICODIN) 5-325 MG per tablet Take 1-2 tablets by mouth every 6 (six) hours as needed for moderate pain.   Yes Historical Provider, MD  losartan (COZAAR) 25 MG tablet Take 12.5 mg by mouth daily.   Yes Historical Provider, MD  miconazole (MICOTIN) 2 % cream Apply 1 application topically 2 (two) times daily. 05/23/14   Charlesetta Shanks, MD   BP 147/100  Pulse 86  Temp(Src) 98.2 F (36.8 C) (Oral)  Resp 19  SpO2 100% Physical Exam  Constitutional: She appears well-developed and well-nourished. No distress.  HENT:  Head: Normocephalic and atraumatic.  Eyes: EOM are normal.  Pulmonary/Chest: Effort normal.  Musculoskeletal:  Examination of bilateral lower legs reveal them to be symmetric in appearance. The knees the calves and ankles show no edema. The calves are soft and nontender without the patient endorsing any pain to palpation. There is no tenderness to palpation over the ankle joint on the right. Examination of the sole of the foot shows she has diffuse scaling of the skin over the entirety of the sole between the toes, this is present on both feet but slightly more pronounced on the right foot. I personally cannot appreciate significant difference in terms  of edema of the forefoot of the right compared to the left. As I palpate the sole of the foot on the right it does not seem to elicit significant pain. I have examined the web spaces of the toes and there is no significant maceration associated with athlete's foot. At this time objectively I would say there is no sign of an acute infection.    ED Course  Procedures (including critical care time) Labs Review Labs Reviewed - No data to display  Imaging Review No results found.   EKG Interpretation None      MDM   Final diagnoses:  Chronic foot pain, right  Tinea pedis of right foot    I counseled the patient that at this point time the appropriate course of management will be to continue to work closely with Dr. Sharol Given. There appears to be some degree of chronic pain here. I advised patient the potential there are issues with mechanical weight distribution of the foot and Dr. Sharol Given may be able to help her with orthotics if appropriate. I did reinforce her the necessity to treat for athlete's foot due to some increased risk of developing infection with extensive athlete's foot.    Charlesetta Shanks, MD 05/23/14 [phone]

## 2014-08-06 ENCOUNTER — Other Ambulatory Visit: Payer: Self-pay | Admitting: Orthopedic Surgery

## 2014-08-06 DIAGNOSIS — M722 Plantar fascial fibromatosis: Secondary | ICD-10-CM

## 2014-08-06 DIAGNOSIS — M7989 Other specified soft tissue disorders: Secondary | ICD-10-CM

## 2014-08-13 ENCOUNTER — Other Ambulatory Visit: Payer: No Typology Code available for payment source

## 2014-08-27 ENCOUNTER — Inpatient Hospital Stay
Admission: RE | Admit: 2014-08-27 | Discharge: 2014-08-27 | Disposition: A | Discharge status: Home / Self Care | Payer: No Typology Code available for payment source | Source: Ambulatory Visit | Attending: Orthopedic Surgery | Admitting: Orthopedic Surgery

## 2014-09-01 ENCOUNTER — Ambulatory Visit
Admission: RE | Admit: 2014-09-01 | Discharge: 2014-09-01 | Disposition: A | Discharge status: Home / Self Care | Payer: No Typology Code available for payment source | Source: Ambulatory Visit | Attending: Orthopedic Surgery | Admitting: Orthopedic Surgery

## 2014-09-01 DIAGNOSIS — M7989 Other specified soft tissue disorders: Secondary | ICD-10-CM

## 2014-09-01 DIAGNOSIS — M722 Plantar fascial fibromatosis: Secondary | ICD-10-CM

## 2014-09-01 MED ORDER — GADOBENATE DIMEGLUMINE 529 MG/ML IV SOLN
20.0000 mL | Freq: Once | INTRAVENOUS | Status: AC | PRN
  Administered 2014-09-01: 20 mL via INTRAVENOUS

## 2014-12-06 ENCOUNTER — Encounter (HOSPITAL_COMMUNITY): Payer: Self-pay

## 2014-12-06 ENCOUNTER — Emergency Department (HOSPITAL_COMMUNITY)
Admission: EM | Admit: 2014-12-06 | Discharge: 2014-12-06 | Disposition: A | Discharge status: Home / Self Care | Payer: No Typology Code available for payment source | Attending: Emergency Medicine | Admitting: Emergency Medicine

## 2014-12-06 ENCOUNTER — Emergency Department (HOSPITAL_COMMUNITY): Payer: No Typology Code available for payment source

## 2014-12-06 DIAGNOSIS — Z79899 Other long term (current) drug therapy: Secondary | ICD-10-CM | POA: Insufficient documentation

## 2014-12-06 DIAGNOSIS — W010XXA Fall on same level from slipping, tripping and stumbling without subsequent striking against object, initial encounter: Secondary | ICD-10-CM | POA: Insufficient documentation

## 2014-12-06 DIAGNOSIS — S93601A Unspecified sprain of right foot, initial encounter: Secondary | ICD-10-CM | POA: Insufficient documentation

## 2014-12-06 DIAGNOSIS — Z8739 Personal history of other diseases of the musculoskeletal system and connective tissue: Secondary | ICD-10-CM | POA: Insufficient documentation

## 2014-12-06 DIAGNOSIS — I10 Essential (primary) hypertension: Secondary | ICD-10-CM | POA: Insufficient documentation

## 2014-12-06 DIAGNOSIS — Y9289 Other specified places as the place of occurrence of the external cause: Secondary | ICD-10-CM | POA: Insufficient documentation

## 2014-12-06 DIAGNOSIS — G43909 Migraine, unspecified, not intractable, without status migrainosus: Secondary | ICD-10-CM | POA: Insufficient documentation

## 2014-12-06 DIAGNOSIS — Y9389 Activity, other specified: Secondary | ICD-10-CM | POA: Insufficient documentation

## 2014-12-06 DIAGNOSIS — Y998 Other external cause status: Secondary | ICD-10-CM | POA: Insufficient documentation

## 2014-12-06 HISTORY — DX: Essential (primary) hypertension: I10

## 2014-12-06 MED ORDER — NAPROXEN 250 MG PO TABS: 250.0000 mg | ORAL_TABLET | Freq: Two times a day (BID) | ORAL | Status: DC

## 2014-12-06 NOTE — Discharge Instructions (Signed)
Foot Sprain The muscles and cord like structures which attach muscle to bone (tendons) that surround the feet are made up of units. A foot sprain can occur at the weakest spot in any of these units. This condition is most often caused by injury to or overuse of the foot, as from playing contact sports, or aggravating a previous injury, or from poor conditioning, or obesity. SYMPTOMS  Pain with movement of the foot.  Tenderness and swelling at the injury site.  Loss of strength is present in moderate or severe sprains. THE THREE GRADES OR SEVERITY OF FOOT SPRAIN ARE:  Mild (Grade I): Slightly pulled muscle without tearing of muscle or tendon fibers or loss of strength.  Moderate (Grade II): Tearing of fibers in a muscle, tendon, or at the attachment to bone, with small decrease in strength.  Severe (Grade III): Rupture of the muscle-tendon-bone attachment, with separation of fibers. Severe sprain requires surgical repair. Often repeating (chronic) sprains are caused by overuse. Sudden (acute) sprains are caused by direct injury or over-use. DIAGNOSIS  Diagnosis of this condition is usually by your own observation. If problems continue, a caregiver may be required for further evaluation and treatment. X-rays may be required to make sure there are not breaks in the bones (fractures) present. Continued problems may require physical therapy for treatment. PREVENTION  Use strength and conditioning exercises appropriate for your sport.  Warm up properly prior to working out.  Use athletic shoes that are made for the sport you are participating in.  Allow adequate time for healing. Early return to activities makes repeat injury more likely, and can lead to an unstable arthritic foot that can result in prolonged disability. Mild sprains generally heal in 3 to 10 days, with moderate and severe sprains taking 2 to 10 weeks. Your caregiver can help you determine the proper time required for  healing. HOME CARE INSTRUCTIONS   Apply ice to the injury for 15-20 minutes, 03-04 times per day. Put the ice in a plastic bag and place a towel between the bag of ice and your skin.  An elastic wrap (like an Ace bandage) may be used to keep swelling down.  Keep foot above the level of the heart, or at least raised on a footstool, when swelling and pain are present.  Try to avoid use other than gentle range of motion while the foot is painful. Do not resume use until instructed by your caregiver. Then begin use gradually, not increasing use to the point of pain. If pain does develop, decrease use and continue the above measures, gradually increasing activities that do not cause discomfort, until you gradually achieve normal use.  Use crutches if and as instructed, and for the length of time instructed.  Keep injured foot and ankle wrapped between treatments.  Massage foot and ankle for comfort and to keep swelling down. Massage from the toes up towards the knee.  Only take over-the-counter or prescription medicines for pain, discomfort, or fever as directed by your caregiver. SEEK IMMEDIATE MEDICAL CARE IF:   Your pain and swelling increase, or pain is not controlled with medications.  You have loss of feeling in your foot or your foot turns cold or blue.  You develop new, unexplained symptoms, or an increase of the symptoms that brought you to your caregiver. MAKE SURE YOU:   Understand these instructions.  Will watch your condition.  Will get help right away if you are not doing well or get worse. Document Released:   01/30/2002 Document Revised: 11/02/2011 Document Reviewed: 03/29/2008 [employer] Patient Information 2015 [employer]. This information is not intended to replace advice given to you by your health care provider. Make sure you discuss any questions you have with your health care provider.  

## 2014-12-06 NOTE — ED Notes (Signed)
Pt alert x4 respirations easy non labored. Pt rates pain 11/10 no outward signs of distress.

## 2014-12-06 NOTE — ED Provider Notes (Signed)
CSN: [id number]     Arrival date & time 12/06/14  1017 History   First MD Initiated Contact with Patient 12/06/14 1027     Chief Complaint  Patient presents with  . Foot Injury   Jackie Reed is a 49 y.o. female with a history of plantar fibromatosis who presents to the ED complaining of right dorsal foot pain after she tripped on her foot 4 days ago. She is complaining of 10 out of 10 right dorsal foot pain that is worse with movement. The patient reports she was walking downstairs and not simply missed a step. She then hit the dorsal aspect of her right foot. The patient had surgery for her plantar fibromatosis by Dr. Gwyndolyn Saxon Ward in [place] 6 months ago. The patient walks into the emergency department wearing a postop shoe. The patient denies any new numbness or tingling.  (Consider location/radiation/quality/duration/timing/severity/associated sxs/prior Treatment) HPI  Past Medical History  Diagnosis Date  . Migraine   . Plantar fibromatosis   . Hypertension    Past Surgical History  Procedure Laterality Date  . Partial hysterectomy    . Foot surgery     Family History  Problem Relation Age of Onset  . Heart failure Mother   . Heart failure Father    History  Substance Use Topics  . Smoking status: Never Smoker   . Smokeless tobacco: Never Used  . Alcohol Use: No   OB History    No data available     Review of Systems  Constitutional: Negative for fever.  Musculoskeletal: Positive for joint swelling. Negative for gait problem.  Skin: Negative for rash.  Neurological: Negative for weakness and numbness.      Allergies  Morphine and related and Tramadol  Home Medications   Prior to Admission medications   Medication Sig Start Date End Date Taking? Authorizing Provider  gabapentin (NEURONTIN) 100 MG capsule Take 100 mg by mouth 3 (three) times daily.    Historical Provider, MD  HYDROcodone-acetaminophen (NORCO/VICODIN) 5-325 MG per tablet Take 1-2  tablets by mouth every 6 (six) hours as needed for moderate pain.    Historical Provider, MD  losartan (COZAAR) 25 MG tablet Take 12.5 mg by mouth daily.    Historical Provider, MD  miconazole (MICOTIN) 2 % cream Apply 1 application topically 2 (two) times daily. 05/23/14   Charlesetta Shanks, MD  naproxen (NAPROSYN) 250 MG tablet Take 1 tablet (250 mg total) by mouth 2 (two) times daily with a meal. 12/06/14   Waynetta Pean, PA-C   BP 158/99 mmHg  Pulse 65  Temp(Src) 97.6 F (36.4 C) (Oral)  Resp 18  Ht 5\' 2"  (1.575 m)  Wt 211 lb (95.709 kg)  BMI 38.58 kg/m2  SpO2 100% Physical Exam  Constitutional: She appears well-developed and well-nourished. No distress.  HENT:  Head: Normocephalic and atraumatic.  Eyes: Right eye exhibits no discharge. Left eye exhibits no discharge.  Cardiovascular: Intact distal pulses.   Bilateral dorsalis pedis and posterior tibialis pulses are intact. Good capillary refill to her distal toes.  Pulmonary/Chest: Effort normal. No respiratory distress.  Musculoskeletal: She exhibits edema and tenderness.  There is a mild amount of edema overlying the dorsal medial aspect of her right foot. The area is tender to palpation. No overlying erythema. No deformity noted. No ankle pain or edema.  The patient is able to ambulate without difficulty or assistance.  Neurological: She is alert. Coordination normal.  Sensation is intact to her bilateral feet.  Skin:  No rash noted. She is not diaphoretic.  Psychiatric: She has a normal mood and affect. Her behavior is normal.  Nursing note and vitals reviewed.   ED Course  Procedures (including critical care time) Labs Review Labs Reviewed - No data to display  Imaging Review Dg Foot Complete Right  12/06/2014   CLINICAL DATA:  Pain following fall 3 days prior  EXAM: RIGHT FOOT COMPLETE - 3+ VIEW  COMPARISON:  MRI foot September 01, 2014  FINDINGS: Frontal, oblique, and lateral views obtained. There is no fracture or  dislocation. There is mild narrowing of all PIP and DIP joints. No erosive change. There is a spur arising from the inferior calcaneus. Bones are osteoporotic.  IMPRESSION: Bones osteoporotic. Narrowing all PIP and DIP joints. Inferior calcaneal spur. No fracture or dislocation.   Electronically Signed   By: Lowella Grip III M.D.   On: 12/06/2014 11:04     EKG Interpretation None      Filed Vitals:   12/06/14 1029  BP: 158/99  Pulse: 65  Temp: 97.6 F (36.4 C)  TempSrc: Oral  Resp: 18  Height: 5\' 2"  (1.575 m)  Weight: 211 lb (95.709 kg)  SpO2: 100%     MDM   Meds given in ED:  Medications - No data to display  New Prescriptions   NAPROXEN (NAPROSYN) 250 MG TABLET    Take 1 tablet (250 mg total) by mouth 2 (two) times daily with a meal.    Final diagnoses:  Foot sprain, right, initial encounter   This is a 49 year old female with a history of plantar fibromatosis who presented to the emergency department complaining of right foot pain after a trip and fall on her foot 4 days ago. The patient has tenderness, and mild edema to the dorsal lateral aspect of her right foot. There is no obvious deformity. The patient is able to ambulate. The patient has postop shoe that she has been wearing. X-ray indicates no fracture or dislocation. Advised patient she can continue using her postop shoe and education on rest, ice and elevation. Will give the patient a short course of naproxen for pain and inflammation control. I advised her to follow-up with her primary care provider and her orthopedic surgeon as needed for continued pain. I advised the patient to follow-up with their primary care provider this week. I advised the patient to return to the emergency department with new or worsening symptoms or new concerns. The patient verbalized understanding and agreement with plan.       Waynetta Pean, PA-C 12/06/14 [place], MD 12/07/14 [phone]

## 2014-12-06 NOTE — ED Notes (Signed)
Pt states mechanical fall 4 days ago with trauma to top of right foot. Pain has not improved with motrin and ice. Swelling noted /no obvious deformity.

## 2015-06-26 ENCOUNTER — Emergency Department (HOSPITAL_COMMUNITY)
Admission: EM | Admit: 2015-06-26 | Discharge: 2015-06-26 | Disposition: A | Discharge status: Home / Self Care | Payer: No Typology Code available for payment source | Attending: Emergency Medicine | Admitting: Emergency Medicine

## 2015-06-26 ENCOUNTER — Emergency Department (HOSPITAL_COMMUNITY): Payer: No Typology Code available for payment source

## 2015-06-26 ENCOUNTER — Encounter (HOSPITAL_COMMUNITY): Payer: Self-pay | Admitting: Emergency Medicine

## 2015-06-26 DIAGNOSIS — I10 Essential (primary) hypertension: Secondary | ICD-10-CM | POA: Insufficient documentation

## 2015-06-26 DIAGNOSIS — Z791 Long term (current) use of non-steroidal anti-inflammatories (NSAID): Secondary | ICD-10-CM | POA: Insufficient documentation

## 2015-06-26 DIAGNOSIS — Z8739 Personal history of other diseases of the musculoskeletal system and connective tissue: Secondary | ICD-10-CM | POA: Insufficient documentation

## 2015-06-26 DIAGNOSIS — B349 Viral infection, unspecified: Secondary | ICD-10-CM

## 2015-06-26 DIAGNOSIS — B9789 Other viral agents as the cause of diseases classified elsewhere: Secondary | ICD-10-CM

## 2015-06-26 DIAGNOSIS — J069 Acute upper respiratory infection, unspecified: Secondary | ICD-10-CM

## 2015-06-26 DIAGNOSIS — Z79899 Other long term (current) drug therapy: Secondary | ICD-10-CM | POA: Insufficient documentation

## 2015-06-26 NOTE — ED Notes (Signed)
Pt c/o cold symptoms  That include: body aches, chills, congestion, that started last Friday. Pt states that she has been taking Sudafed, Robitussin, Theraflu but nothing helping her symptoms.

## 2015-06-26 NOTE — ED Provider Notes (Signed)
CSN: [id number]     Arrival date & time 06/26/15  4656 History   First MD Initiated Contact with Patient 06/26/15 0845     Chief Complaint  Patient presents with  . Generalized Body Aches  . Chills  . Nasal Congestion     (Consider location/radiation/quality/duration/timing/severity/associated sxs/prior Treatment) HPI   Jackie Reed is a 49 y.o F with no significant past medical history who presents the emergency department today complaining of generalized body aches, cough, congestion onset 5 days ago. Patient states that she began having a mildly productive cough last Friday with symptoms of congestion and rhinorrhea. Patient subsequently developed body aches and chills. Patient has been taking Sudafed, Robitussin and TheraFlu with no relief. Denies fever, vomiting, diarrhea, shortness of breath, chest pain, sore throat, weakness, numbness, tingling, dizziness, neck stiffness, abdominal pain, dysuria.  Past Medical History  Diagnosis Date  . Migraine   . Plantar fibromatosis   . Hypertension    Past Surgical History  Procedure Laterality Date  . Partial hysterectomy    . Foot surgery     Family History  Problem Relation Age of Onset  . Heart failure Mother   . Heart failure Father    Social History  Substance Use Topics  . Smoking status: Never Smoker   . Smokeless tobacco: Never Used  . Alcohol Use: No   OB History    No data available     Review of Systems  All other systems reviewed and are negative.     Allergies  Morphine and related and Tramadol  Home Medications   Prior to Admission medications   Medication Sig Start Date End Date Taking? Authorizing Provider  losartan (COZAAR) 25 MG tablet Take 12.5 mg by mouth every morning.    Yes Historical Provider, MD  miconazole (MICOTIN) 2 % cream Apply 1 application topically 2 (two) times daily. Patient not taking: Reported on 06/26/2015 05/23/14   Charlesetta Shanks, MD  naproxen (NAPROSYN) 250 MG tablet Take 1  tablet (250 mg total) by mouth 2 (two) times daily with a meal. 12/06/14   Waynetta Pean, PA-C   BP 122/80 mmHg  Pulse 90  Temp(Src) 98.7 F (37.1 C) (Oral)  Resp 16  SpO2 97% Physical Exam  Constitutional: She is oriented to person, place, and time. She appears well-developed and well-nourished. No distress.  HENT:  Head: Normocephalic and atraumatic.  Right Ear: Tympanic membrane, external ear and ear canal normal.  Left Ear: Tympanic membrane, external ear and ear canal normal.  Nose: No mucosal edema, rhinorrhea or sinus tenderness. Right sinus exhibits no maxillary sinus tenderness. Left sinus exhibits no maxillary sinus tenderness.  Mouth/Throat: Uvula is midline, oropharynx is clear and moist and mucous membranes are normal. No uvula swelling. No oropharyngeal exudate, posterior oropharyngeal edema or posterior oropharyngeal erythema.  Eyes: Conjunctivae and EOM are normal. Pupils are equal, round, and reactive to light. Right eye exhibits no discharge. Left eye exhibits no discharge. No scleral icterus.  Neck: Neck supple.  Cardiovascular: Normal rate, regular rhythm, normal heart sounds and intact distal pulses.  Exam reveals no gallop and no friction rub.   No murmur heard. Pulmonary/Chest: Effort normal and breath sounds normal. No respiratory distress. She has no wheezes. She has no rales. She exhibits no tenderness.  Abdominal: Soft. Bowel sounds are normal. She exhibits no distension and no mass. There is no tenderness. There is no rebound and no guarding.  Musculoskeletal: Normal range of motion. She exhibits no edema or tenderness.  Lymphadenopathy:    She has no cervical adenopathy.  Neurological: She is alert and oriented to person, place, and time. No cranial nerve deficit. She exhibits normal muscle tone. Coordination normal.  Strength 5/5 throughout. No sensory deficits.  No gait abnormality.   Skin: Skin is warm and dry. No rash noted. She is not diaphoretic. No  erythema. No pallor.  Psychiatric: She has a normal mood and affect. Her behavior is normal.  Nursing note and vitals reviewed.   ED Course  Procedures (including critical care time) Labs Review Labs Reviewed - No data to display  Imaging Review Dg Chest 2 View  06/26/2015  CLINICAL DATA:  Cough and congestion ; shortness of breath EXAM: CHEST  2 VIEW COMPARISON:  September 25, 2012 FINDINGS: Lungs are clear. Heart size and pulmonary vascularity are normal. No adenopathy. No bone lesions. IMPRESSION: No edema or consolidation. Electronically Signed   By: Lowella Grip III M.D.   On: 06/26/2015 09:37   I have personally reviewed and evaluated these images and lab results as part of my medical decision-making.   EKG Interpretation None      MDM   Final diagnoses:  Viral syndrome  Viral URI with cough    49 year old otherwise healthy female presents with 5 days of cold/flu-like symptoms including body aches, congestion, cough. Patient has taken Sudafed, Robitussin, Tamiflu with no relief. No fever, vomiting, difficulty breathing. Patient did not get flu shot. Lung exam benign. No sore throat. No oral pharyngeal erythema. Afebrile. Vital signs stable. Chest x-ray negative for infection/consolidation. Pt in NAD. Pt has not taken NSAIDS or tylenol. Pt states that she feels like this about once per year.   Due to benign clinical exam, negative CXR, stable vital signs suspect this is a viral URI. No further workup warranted at this time. Feel that pt is stable for discharge. Recommend that pt take NSAIDS for body aches. Given strict return precautions which are outlined in pt discharge instructions. Recommend rest and fluid consumption. Pt is to follow up with PCP in 48-72 hours for re-evaluation.     Dondra Spry [place], PA-C 06/26/15 [place], MD 06/26/15 [phone]

## 2015-06-26 NOTE — Discharge Instructions (Signed)
Cough, Adult Coughing is a reflex that clears your throat and your airways. Coughing helps to heal and protect your lungs. It is normal to cough occasionally, but a cough that happens with other symptoms or lasts a long time may be a sign of a condition that needs treatment. A cough may last only 2-3 weeks (acute), or it may last longer than 8 weeks (chronic). CAUSES Coughing is commonly caused by:  Breathing in substances that irritate your lungs.  A viral or bacterial respiratory infection.  Allergies.  Asthma.  Postnasal drip.  Smoking.  Acid backing up from the stomach into the esophagus (gastroesophageal reflux).  Certain medicines.  Chronic lung problems, including COPD (or rarely, lung cancer).  Other medical conditions such as heart failure. HOME CARE INSTRUCTIONS  Pay attention to any changes in your symptoms. Take these actions to help with your discomfort:  Take medicines only as told by your health care provider.  If you were prescribed an antibiotic medicine, take it as told by your health care provider. Do not stop taking the antibiotic even if you start to feel better.  Talk with your health care provider before you take a cough suppressant medicine.  Drink enough fluid to keep your urine clear or pale yellow.  If the air is dry, use a cold steam vaporizer or humidifier in your bedroom or your home to help loosen secretions.  Avoid anything that causes you to cough at work or at home.  If your cough is worse at night, try sleeping in a semi-upright position.  Avoid cigarette smoke. If you smoke, quit smoking. If you need help quitting, ask your health care provider.  Avoid caffeine.  Avoid alcohol.  Rest as needed. SEEK MEDICAL CARE IF:   You have new symptoms.  You cough up pus.  Your cough does not get better after 2-3 weeks, or your cough gets worse.  You cannot control your cough with suppressant medicines and you are losing sleep.  You  develop pain that is getting worse or pain that is not controlled with pain medicines.  You have a fever.  You have unexplained weight loss.  You have night sweats. SEEK IMMEDIATE MEDICAL CARE IF:  You cough up blood.  You have difficulty breathing.  Your heartbeat is very fast.   This information is not intended to replace advice given to you by your health care provider. Make sure you discuss any questions you have with your health care provider.   Document Released: 02/06/2011 Document Revised: 05/01/2015 Document Reviewed: 10/17/2014 Elsevier Interactive Patient Education 2016 [place] may help relieve the symptoms of a cough and cold. They add moisture to the air, which helps mucus to become thinner and less sticky. This makes it easier to breathe and cough up secretions. Cool mist vaporizers do not cause serious burns like hot mist vaporizers, which may also be called steamers or humidifiers. Vaporizers have not been proven to help with colds. You should not use a vaporizer if you are allergic to mold. HOME CARE INSTRUCTIONS  Follow the package instructions for the vaporizer.  Do not use anything other than distilled water in the vaporizer.  Do not run the vaporizer all of the time. This can cause mold or bacteria to grow in the vaporizer.  Clean the vaporizer after each time it is used.  Clean and dry the vaporizer well before storing it.  Stop using the vaporizer if worsening respiratory symptoms develop.  This information is not intended to replace advice given to you by your health care provider. Make sure you discuss any questions you have with your health care provider.   Document Released: 05/07/2004 Document Revised: 08/15/2013 Document Reviewed: 12/28/2012 Elsevier Interactive Patient Education 2016 [employer]  Viral Infections A viral infection can be caused by different types of viruses.Most viral infections are  not serious and resolve on their own. However, some infections may cause severe symptoms and may lead to further complications. SYMPTOMS Viruses can frequently cause:  Minor sore throat.  Aches and pains.  Headaches.  Runny nose.  Different types of rashes.  Watery eyes.  Tiredness.  Cough.  Loss of appetite.  Gastrointestinal infections, resulting in nausea, vomiting, and diarrhea. These symptoms do not respond to antibiotics because the infection is not caused by bacteria. However, you might catch a bacterial infection following the viral infection. This is sometimes called a "superinfection." Symptoms of such a bacterial infection may include:  Worsening sore throat with pus and difficulty swallowing.  Swollen neck glands.  Chills and a high or persistent fever.  Severe headache.  Tenderness over the sinuses.  Persistent overall ill feeling (malaise), muscle aches, and tiredness (fatigue).  Persistent cough.  Yellow, green, or brown mucus production with coughing. HOME CARE INSTRUCTIONS   Only take over-the-counter or prescription medicines for pain, discomfort, diarrhea, or fever as directed by your caregiver.  Drink enough water and fluids to keep your urine clear or pale yellow. Sports drinks can provide valuable electrolytes, sugars, and hydration.  Get plenty of rest and maintain proper nutrition. Soups and broths with crackers or rice are fine. SEEK IMMEDIATE MEDICAL CARE IF:   You have severe headaches, shortness of breath, chest pain, neck pain, or an unusual rash.  You have uncontrolled vomiting, diarrhea, or you are unable to keep down fluids.  You or your child has an oral temperature above 102 F (38.9 C), not controlled by medicine.  Your baby is older than 3 months with a rectal temperature of 102 F (38.9 C) or higher.  Your baby is 79 months old or younger with a rectal temperature of 100.4 F (38 C) or higher. MAKE SURE YOU:    Understand these instructions.  Will watch your condition.  Will get help right away if you are not doing well or get worse.   This information is not intended to replace advice given to you by your health care provider. Make sure you discuss any questions you have with your health care provider.  Follow up with your primary care provider in 48-72 hours if symptoms do not improve. Take ibuprofen for body aches. Recommend Mucinex for decongestant. Discontinue Robitussin. Encourage fluid consumption. Return to the Emergency Department if you experience worsening of your symptoms, fever, difficulty breathing or swallowing, chest pain, vomiting.

## 2015-08-14 ENCOUNTER — Encounter (HOSPITAL_COMMUNITY): Payer: Self-pay | Admitting: *Deleted

## 2015-08-14 ENCOUNTER — Emergency Department (HOSPITAL_COMMUNITY)
Admission: EM | Admit: 2015-08-14 | Discharge: 2015-08-14 | Disposition: A | Discharge status: Home / Self Care | Payer: No Typology Code available for payment source | Attending: Emergency Medicine | Admitting: Emergency Medicine

## 2015-08-14 DIAGNOSIS — Z79899 Other long term (current) drug therapy: Secondary | ICD-10-CM | POA: Insufficient documentation

## 2015-08-14 DIAGNOSIS — Z791 Long term (current) use of non-steroidal anti-inflammatories (NSAID): Secondary | ICD-10-CM | POA: Insufficient documentation

## 2015-08-14 DIAGNOSIS — I10 Essential (primary) hypertension: Secondary | ICD-10-CM | POA: Insufficient documentation

## 2015-08-14 DIAGNOSIS — H6121 Impacted cerumen, right ear: Secondary | ICD-10-CM | POA: Insufficient documentation

## 2015-08-14 DIAGNOSIS — H6122 Impacted cerumen, left ear: Secondary | ICD-10-CM | POA: Insufficient documentation

## 2015-08-14 DIAGNOSIS — Z8739 Personal history of other diseases of the musculoskeletal system and connective tissue: Secondary | ICD-10-CM | POA: Insufficient documentation

## 2015-08-14 MED ORDER — CARBAMIDE PEROXIDE 6.5 % OT SOLN: 5.0000 [drp] | Freq: Two times a day (BID) | OTIC | Status: DC

## 2015-08-14 NOTE — Discharge Instructions (Signed)
Hearing Loss Hearing loss is a partial or total loss of the ability to hear. This can be temporary or permanent, and it can happen in one or both ears. Hearing loss may be referred to as deafness. Medical care is necessary to treat hearing loss properly and to prevent the condition from getting worse. Your hearing may partially or completely come back, depending on what caused your hearing loss and how severe it is. In some cases, hearing loss is permanent. CAUSES Common causes of hearing loss include:   Too much wax in the ear canal.   Infection of the ear canal or middle ear.   Fluid in the middle ear.   Injury to the ear or surrounding area.   An object stuck in the ear.   Prolonged exposure to loud sounds, such as music.  Less common causes of hearing loss include:   Tumors in the ear.   Viral or bacterial infections, such as meningitis.   A hole in the eardrum (perforated eardrum).  Problems with the hearing nerve that sends signals between the brain and the ear.  Certain medicines.  SYMPTOMS  Symptoms of this condition may include:  Difficulty telling the difference between sounds.  Difficulty following a conversation when there is background noise.  Lack of response to sounds in your environment. This may be most noticeable when you do not respond to startling sounds.  Needing to turn up the volume on the television, radio, etc.  Ringing in the ears.  Dizziness.  Pain in the ears. DIAGNOSIS This condition is diagnosed based on a physical exam and a hearing test (audiometry). The audiometry test will be performed by a hearing specialist (audiologist). You may also be referred to an ear, nose, and throat (ENT) specialist (otolaryngologist).  TREATMENT Treatment for recent onset of hearing loss may include:   Ear wax removal.   Being prescribed medicines to prevent infection (antibiotics).   Being prescribed medicines to reduce inflammation  (corticosteroids).  HOME CARE INSTRUCTIONS  If you were prescribed an antibiotic medicine, take it as told by your health care provider. Do not stop taking the antibiotic even if you start to feel better.  Take over-the-counter and prescription medicines only as told by your health care provider.  Avoid loud noises.   Return to your normal activities as told by your health care provider. Ask your health care provider what activities are safe for you.  Keep all follow-up visits as told by your health care provider. This is important. SEEK MEDICAL CARE IF:   You feel dizzy.   You develop new symptoms.   You vomit or feel nauseous.   You have a fever.  SEEK IMMEDIATE MEDICAL CARE IF:  You develop sudden changes in your vision.   You have severe ear pain.   You have new or increased weakness.  You have a severe headache.   This information is not intended to replace advice given to you by your health care provider. Make sure you discuss any questions you have with your health care provider.   Document Released: 08/10/2005 Document Revised: 05/01/2015 Document Reviewed: 12/26/2014 Elsevier Interactive Patient Education 2016 [place] Impaction The structures of the external ear canal secrete a waxy substance known as cerumen. Excess cerumen can build up in the ear canal, causing a condition known as cerumen impaction. Cerumen impaction can cause ear pain and disrupt the function of the ear. The rate of cerumen production differs for each individual. In certain  individuals, the configuration of the ear canal may decrease his or her ability to naturally remove cerumen. CAUSES Cerumen impaction is caused by excessive cerumen production or buildup. RISK FACTORS  Frequent use of swabs to clean ears.  Having narrow ear canals.  Having eczema.  Being dehydrated. SIGNS AND SYMPTOMS  Diminished hearing.  Ear drainage.  Ear pain.  Ear  itch. TREATMENT Treatment may involve:  Over-the-counter or prescription ear drops to soften the cerumen.  Removal of cerumen by a health care provider. This may be done with:  Irrigation with warm water. This is the most common method of removal.  Ear curettes and other instruments.  Surgery. This may be done in severe cases. HOME CARE INSTRUCTIONS  Take medicines only as directed by your health care provider.  Do not insert objects into the ear with the intent of cleaning the ear. PREVENTION  Do not insert objects into the ear, even with the intent of cleaning the ear. Removing cerumen as a part of normal hygiene is not necessary, and the use of swabs in the ear canal is not recommended.  Drink enough water to keep your urine clear or pale yellow.  Control your eczema if you have it. SEEK MEDICAL CARE IF:  You develop ear pain.  You develop bleeding from the ear.  The cerumen does not clear after you use ear drops as directed.   This information is not intended to replace advice given to you by your health care provider. Make sure you discuss any questions you have with your health care provider.   Document Released: 09/17/2004 Document Revised: 08/31/2014 Document Reviewed: 03/27/2015 Elsevier Interactive Patient Education [employer].

## 2015-08-14 NOTE — ED Notes (Signed)
Pt complains of difficulty hearing for the past 2 weeks. Pt states she had a sinus infection when the hearing difficulty began. Pt denies pain.

## 2015-08-14 NOTE — ED Provider Notes (Signed)
CSN: [id number]     Arrival date & time 08/14/15  1654 History  By signing my name below, I, Julien Nordmann, attest that this documentation has been prepared under the direction and in the presence of Domenic Moras, PA-C. Electronically Signed: Julien Nordmann, ED Scribe. 08/14/2015. 5:44 PM.    Chief Complaint  Patient presents with  . Ear Fullness      The history is provided by the patient. No language interpreter was used.   HPI Comments: Jackie Reed is a 48 y.o. female who presents to the Emergency Department complaining of constant, gradual worsening bilateral ear fullness onset 2 weeks ago. She describes the pain as a pressure sensation and she is unable to ear completely out of her right ear. Pt has tried shaking her head and other movements to help get rid of the feeling in her ears with no relief. Pt reports having a sinus infection prior to her symptoms. She denies pain, facial pain, fever, congestion, and sore throat.   Past Medical History  Diagnosis Date  . Migraine   . Plantar fibromatosis   . Hypertension    Past Surgical History  Procedure Laterality Date  . Partial hysterectomy    . Foot surgery     Family History  Problem Relation Age of Onset  . Heart failure Mother   . Heart failure Father    Social History  Substance Use Topics  . Smoking status: Never Smoker   . Smokeless tobacco: Never Used  . Alcohol Use: No   OB History    No data available     Review of Systems  Constitutional: Negative for fever.  HENT: Negative for congestion.       Allergies  Morphine and related and Tramadol  Home Medications   Prior to Admission medications   Medication Sig Start Date End Date Taking? Authorizing Provider  losartan (COZAAR) 25 MG tablet Take 12.5 mg by mouth every morning.     Historical Provider, MD  miconazole (MICOTIN) 2 % cream Apply 1 application topically 2 (two) times daily. Patient not taking: Reported on 06/26/2015 05/23/14   Charlesetta Shanks, MD  naproxen (NAPROSYN) 250 MG tablet Take 1 tablet (250 mg total) by mouth 2 (two) times daily with a meal. 12/06/14   Waynetta Pean, PA-C   Triage vitals: BP 135/51 mmHg  Pulse 74  Temp(Src) 98.2 F (36.8 C) (Oral)  Resp 16  SpO2 99% Physical Exam  Constitutional: She appears well-developed and well-nourished. No distress.  HENT:  Head: Normocephalic and atraumatic.  Mild cerumen build up in left ear, but TMs normal and not infected Cerumen impaction in right ear, TM not visualized Nose and throat are normal  Eyes: Right eye exhibits no discharge. Left eye exhibits no discharge.  Neck:  No evidence of mastoiditis and no lymphadenopathy  Pulmonary/Chest: Effort normal. No respiratory distress.  Neurological: She is alert. Coordination normal.  Skin: No rash noted. She is not diaphoretic.  Psychiatric: She has a normal mood and affect. Her behavior is normal.  Nursing note and vitals reviewed.   ED Course  .Ear Cerumen Removal Date/Time: 08/14/2015 6:41 PM Performed by: Domenic Moras Authorized by: Domenic Moras Consent: Verbal consent obtained. Risks and benefits: risks, benefits and alternatives were discussed Consent given by: patient Patient understanding: patient states understanding of the procedure being performed Patient identity confirmed: verbally with patient and arm band Local anesthetic: none Ceruminolytics applied: Ceruminolytics applied prior to the procedure. Location details: right ear Procedure  type: irrigation Patient sedated: no Patient tolerance: Patient tolerated the procedure well with no immediate complications    DIAGNOSTIC STUDIES: Oxygen Saturation is 99% on RA, normal by my interpretation.  COORDINATION OF CARE:  5:42 PM Discussed treatment plan with pt at bedside and pt agreed to plan.  Labs Review Labs Reviewed - No data to display  Imaging Review No results found. I have personally reviewed and evaluated these images and lab  results as part of my medical decision-making.   EKG Interpretation None      MDM   Final diagnoses:  Hearing loss due to cerumen impaction, right    BP 135/51 mmHg  Pulse 77  Temp(Src) 98.2 F (36.8 C) (Oral)  Resp 18  SpO2 95%  I personally performed the services described in this documentation, which was scribed in my presence. The recorded information has been reviewed and is accurate.     6:41 PM Pt here with decreased hearing to R ear 2/2 cerumen impaction.  Hearing improves after ear irrigation.  TM intact, no signs of infection.  Recommend debrox for cerumen removal.  ENT referral given as needed.  Return precaution discussed.    Domenic Moras, PA-C 08/14/15 [address], MD 08/15/15 [phone]

## 2015-08-25 DIAGNOSIS — D126 Benign neoplasm of colon, unspecified: Secondary | ICD-10-CM

## 2015-08-25 HISTORY — DX: Benign neoplasm of colon, unspecified: D12.6

## 2015-11-08 ENCOUNTER — Ambulatory Visit (INDEPENDENT_AMBULATORY_CARE_PROVIDER_SITE_OTHER): Payer: BLUE CROSS/BLUE SHIELD | Admitting: Internal Medicine

## 2015-11-08 ENCOUNTER — Encounter: Payer: Self-pay | Admitting: Internal Medicine

## 2015-11-08 VITALS — BP 164/110 | HR 72 | Temp 97.8°F | Resp 16 | Wt 204.0 lb

## 2015-11-08 DIAGNOSIS — Z1211 Encounter for screening for malignant neoplasm of colon: Secondary | ICD-10-CM

## 2015-11-08 DIAGNOSIS — F4323 Adjustment disorder with mixed anxiety and depressed mood: Secondary | ICD-10-CM

## 2015-11-08 DIAGNOSIS — I10 Essential (primary) hypertension: Secondary | ICD-10-CM | POA: Diagnosis not present

## 2015-11-08 DIAGNOSIS — G4700 Insomnia, unspecified: Secondary | ICD-10-CM

## 2015-11-08 DIAGNOSIS — M6580 Other synovitis and tenosynovitis, unspecified site: Secondary | ICD-10-CM | POA: Diagnosis not present

## 2015-11-08 MED ORDER — LOSARTAN POTASSIUM 25 MG PO TABS: 25.0000 mg | ORAL_TABLET | Freq: Every morning | ORAL | Status: DC

## 2015-11-08 MED ORDER — TRAZODONE HCL 50 MG PO TABS: 50.0000 mg | ORAL_TABLET | Freq: Every evening | ORAL | Status: DC | PRN

## 2015-11-08 NOTE — Assessment & Plan Note (Signed)
Associated with difficulty sleeping - she does want to try a medication - trial of trazodone 50-100 mg HS Will refer to a therapist - she defers any medication at this time

## 2015-11-08 NOTE — Assessment & Plan Note (Signed)
Related to stress/depression Start trazodone 50-100 mg daily Referred to a therapist Follow up in 2-3 weeks, sooner if needed

## 2015-11-08 NOTE — Progress Notes (Signed)
Pre visit review using our clinic review tool, if applicable. No additional management support is needed unless otherwise documented below in the visit note. 

## 2015-11-08 NOTE — Assessment & Plan Note (Signed)
Not controlled Restart losartan at 25 mg daily Kidney function check three months ago and was normal per pt Follow up in 2-3 weeks - will check cmp then or at following visit Low sodium diet stressed Start monitoring BP

## 2015-11-08 NOTE — Progress Notes (Signed)
Subjective:    Patient ID: Jackie Reed, female    DOB: [birth year], 50 y.o.   MRN: [id number]  HPI She is here to establish with a new pcp.    Hypertension: She is not taking her medication daily - it ws stopped because her BP was controlled at her last visit. She is not compliant with a low sodium diet.  She denies chest pain, palpitations, edema, shortness of breath and regular headaches. She is not exercising regularly.  She does not monitor her blood pressure at home.    She drinks sweet tea every day- one bottle.  She drinks regular soda, but not daily.    Right hand pain:  She has calcifications on the tendons in her right hand.  It is getting larger and causes discomfort.  She would like to see a hand specialist.    Increased stress:  She just lost her husband and she is dealing with a major situation with her son.  She only sleeps 3-4 hours at night. She has difficulty getting to and staying asleep.  She wakes up and can not go back to sleep.  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  She is interested in seeing a therapist.  She does not feel she needs medication at this time.  She does have some depression, but denies suicidal thoughts.    Medications and allergies reviewed with patient and updated if appropriate.  Patient Active Problem List   Diagnosis Date Noted  . Essential hypertension, benign 11/08/2015  . Tendon calcification 11/08/2015  . Adjustment disorder with mixed anxiety and depressed mood 11/08/2015  . Insomnia 11/08/2015    No current outpatient prescriptions on file prior to visit.   No current facility-administered medications on file prior to visit.    Past Medical History  Diagnosis Date  . Migraine   . Plantar fibromatosis   . Hypertension     Past Surgical History  Procedure Laterality Date  . Partial hysterectomy    . Foot surgery      Social History   Social History  . Marital  Status: Widowed    Spouse Name: N/A  . Number of Children: N/A  . Years of Education: N/A   Social History Main Topics  . Smoking status: Never Smoker   . Smokeless tobacco: Never Used  . Alcohol Use: No  . Drug Use: No  . Sexual Activity: Yes    Birth Control/ Protection: None   Other Topics Concern  . None   Social History Narrative    Family History  Problem Relation Age of Onset  . Heart failure Mother   . Diabetes Mother   . Heart failure Father   . Hypertension Father   . Hyperlipidemia Father     Review of Systems  Constitutional: Positive for fatigue (not sleeping well). Negative for fever, chills, appetite change and unexpected weight change.  HENT: Negative for hearing loss.   Eyes: Negative for visual disturbance.  Respiratory: Negative for cough, shortness of breath and wheezing.   Cardiovascular: Negative for chest pain, palpitations and leg swelling.  Gastrointestinal: Negative for nausea, abdominal pain, diarrhea, constipation and blood in stool.       No gerd  Endocrine: Negative for polydipsia and polyuria.  Genitourinary: Negative for dysuria and hematuria.  Musculoskeletal: Negative for myalgias, back pain and arthralgias.  Neurological: Positive for headaches (occasional). Negative for dizziness and light-headedness.  Psychiatric/Behavioral: Positive for sleep disturbance. Negative for self-injury and dysphoric mood. The patient is not nervous/anxious.        Objective:   Filed Vitals:   11/08/15 1303  BP: 164/110  Pulse: 72  Temp: 97.8 F (36.6 C)  Resp: 16   Filed Weights   11/08/15 1303  Weight: 204 lb (92.534 kg)   Body mass index is 37.3 kg/(m^2).   Physical Exam Constitutional: She appears well-developed and well-nourished. No distress.  HENT:  Head: Normocephalic and atraumatic.  Right Ear: External ear normal. Normal ear canal and TM Left Ear: External ear normal.  Normal ear canal and TM Mouth/Throat: Oropharynx is clear  and moist.  Normal bilateral ear canals and tympanic membranes  Eyes: Conjunctivae and EOM are normal.  Neck: Neck supple. No tracheal deviation present. No thyromegaly present.  No carotid bruit  Cardiovascular: Normal rate, regular rhythm and normal heart sounds.   No murmur heard.  No edema. Pulmonary/Chest: Effort normal and breath sounds normal. No respiratory distress. She has no wheezes. She has no rales.  Breast: deferred to Gyn Abdominal: Soft. She exhibits no distension. There is no tenderness.  Lymphadenopathy: She has no cervical adenopathy.  Skin: Skin is warm and dry. She is not diaphoretic.  Psychiatric: She has a normal mood and affect. Her behavior is normal.       Assessment & Plan:   See Problem List for Assessment and Plan of chronic medical problems.  Follow up in 2-3 weeks to recheck BP and check on sleep med

## 2015-11-08 NOTE — Assessment & Plan Note (Signed)
No trigger fingers It is painful and calcifications getting bigger Will refer to hand surgery

## 2015-11-08 NOTE — Patient Instructions (Addendum)
   Medications reviewed and updated.  Changes include restarting losartan at 25 mg daily.  Starting trazodone 50-100 mg at night for sleep.  Your prescription(s) have been submitted to your pharmacy. Please take as directed and contact our office if you believe you are having problem(s) with the medication(s).  A referral for for a hand specialist, gastroenterologist and therapist have been ordered.  Please followup in 2-3 weeks

## 2015-11-12 ENCOUNTER — Encounter: Payer: Self-pay | Admitting: Internal Medicine

## 2015-11-18 ENCOUNTER — Emergency Department (HOSPITAL_COMMUNITY): Payer: BLUE CROSS/BLUE SHIELD

## 2015-11-18 ENCOUNTER — Encounter (HOSPITAL_COMMUNITY): Payer: Self-pay | Admitting: Emergency Medicine

## 2015-11-18 ENCOUNTER — Telehealth: Payer: Self-pay | Admitting: Internal Medicine

## 2015-11-18 ENCOUNTER — Emergency Department (HOSPITAL_COMMUNITY)
Admission: EM | Admit: 2015-11-18 | Discharge: 2015-11-18 | Disposition: A | Discharge status: Home / Self Care | Payer: BLUE CROSS/BLUE SHIELD | Attending: Emergency Medicine | Admitting: Emergency Medicine

## 2015-11-18 DIAGNOSIS — I10 Essential (primary) hypertension: Secondary | ICD-10-CM | POA: Diagnosis not present

## 2015-11-18 DIAGNOSIS — R531 Weakness: Secondary | ICD-10-CM | POA: Diagnosis present

## 2015-11-18 DIAGNOSIS — Z8739 Personal history of other diseases of the musculoskeletal system and connective tissue: Secondary | ICD-10-CM | POA: Insufficient documentation

## 2015-11-18 DIAGNOSIS — G43809 Other migraine, not intractable, without status migrainosus: Secondary | ICD-10-CM | POA: Diagnosis not present

## 2015-11-18 DIAGNOSIS — I639 Cerebral infarction, unspecified: Secondary | ICD-10-CM

## 2015-11-18 LAB — DIFFERENTIAL
Basophils Absolute: 0 10*3/uL (ref 0.0–0.1)
Basophils Relative: 0 %
Eosinophils Absolute: 0.2 10*3/uL (ref 0.0–0.7)
Eosinophils Relative: 4 %
Lymphocytes Relative: 36 %
Lymphs Abs: 2.1 10*3/uL (ref 0.7–4.0)
Monocytes Absolute: 0.5 10*3/uL (ref 0.1–1.0)
Monocytes Relative: 9 %
Neutro Abs: 2.9 10*3/uL (ref 1.7–7.7)
Neutrophils Relative %: 51 %

## 2015-11-18 LAB — COMPREHENSIVE METABOLIC PANEL
ALT: 12 U/L — ABNORMAL LOW (ref 14–54)
AST: 20 U/L (ref 15–41)
Albumin: 4.2 g/dL (ref 3.5–5.0)
Alkaline Phosphatase: 140 U/L — ABNORMAL HIGH (ref 38–126)
Anion gap: 9 (ref 5–15)
BUN: 12 mg/dL (ref 6–20)
CO2: 23 mmol/L (ref 22–32)
Calcium: 9.4 mg/dL (ref 8.9–10.3)
Chloride: 108 mmol/L (ref 101–111)
Creatinine, Ser: 0.73 mg/dL (ref 0.44–1.00)
GFR calc Af Amer: >60 mL/min (ref >60)
GFR calc non Af Amer: >60 mL/min (ref >60)
Glucose, Bld: 101 mg/dL — ABNORMAL HIGH (ref 65–99)
Potassium: 3.6 mmol/L (ref 3.5–5.1)
Sodium: 140 mmol/L (ref 135–145)
Total Bilirubin: 0.5 mg/dL (ref 0.3–1.2)
Total Protein: 8.1 g/dL (ref 6.5–8.1)

## 2015-11-18 LAB — PROTIME-INR
INR: 1.26 (ref 0.00–1.49)
Prothrombin Time: 15.4 seconds — ABNORMAL HIGH (ref 11.6–15.2)

## 2015-11-18 LAB — I-STAT CHEM 8, ED
BUN: 12 mg/dL (ref 6–20)
Calcium, Ion: 1.18 mmol/L (ref 1.12–1.23)
Chloride: 106 mmol/L (ref 101–111)
Creatinine, Ser: 0.7 mg/dL (ref 0.44–1.00)
Glucose, Bld: 94 mg/dL (ref 65–99)
HCT: 45 % (ref 36.0–46.0)
Hemoglobin: 15.3 g/dL — ABNORMAL HIGH (ref 12.0–15.0)
Potassium: 3.5 mmol/L (ref 3.5–5.1)
Sodium: 141 mmol/L (ref 135–145)
TCO2: 23 mmol/L (ref 0–100)

## 2015-11-18 LAB — CBC
HCT: 37.8 % (ref 36.0–46.0)
Hemoglobin: 12.1 g/dL (ref 12.0–15.0)
MCH: 22.4 pg — ABNORMAL LOW (ref 26.0–34.0)
MCHC: 32 g/dL (ref 30.0–36.0)
MCV: 70 fL — ABNORMAL LOW (ref 78.0–100.0)
Platelets: 362 10*3/uL (ref 150–400)
RBC: 5.4 MIL/uL — ABNORMAL HIGH (ref 3.87–5.11)
RDW: 15.4 % (ref 11.5–15.5)
WBC: 5.7 10*3/uL (ref 4.0–10.5)

## 2015-11-18 LAB — APTT: aPTT: 34 seconds (ref 24–37)

## 2015-11-18 LAB — CBG MONITORING, ED: Glucose-Capillary: 92 mg/dL (ref 65–99)

## 2015-11-18 LAB — I-STAT TROPONIN, ED: Troponin i, poc: 0 ng/mL (ref 0.00–0.08)

## 2015-11-18 MED ORDER — LOSARTAN POTASSIUM 25 MG PO TABS: 50.0000 mg | ORAL_TABLET | Freq: Every morning | ORAL | Status: DC

## 2015-11-18 MED ORDER — AMLODIPINE BESYLATE 5 MG PO TABS: 5.0000 mg | ORAL_TABLET | Freq: Every day | ORAL | Status: DC

## 2015-11-18 NOTE — ED Provider Notes (Addendum)
CSN: [id number]     Arrival date & time 11/18/15  1338 History   First MD Initiated Contact with Patient 11/18/15 1357     Chief Complaint  Patient presents with  . Code Stroke     (Consider location/radiation/quality/duration/timing/severity/associated sxs/prior Treatment) Patient is a 50 y.o. female presenting with weakness. The history is provided by the patient (Patient complains of a headache and some weakness since approximately 9:30 this morning. Around 1:30 9 she started to have some weakness in her left arm).  Weakness This is a new problem. The current episode started 3 to 5 hours ago. The problem occurs constantly. The problem has not changed since onset.Pertinent negatives include no chest pain, no abdominal pain and no headaches. Nothing aggravates the symptoms. Nothing relieves the symptoms.    Past Medical History  Diagnosis Date  . Migraine   . Plantar fibromatosis   . Hypertension    Past Surgical History  Procedure Laterality Date  . Partial hysterectomy    . Foot surgery     Family History  Problem Relation Age of Onset  . Heart failure Mother   . Diabetes Mother   . Heart failure Father   . Hypertension Father   . Hyperlipidemia Father    Social History  Substance Use Topics  . Smoking status: Never Smoker   . Smokeless tobacco: Never Used  . Alcohol Use: No   OB History    No data available     Review of Systems  Constitutional: Negative for appetite change and fatigue.  HENT: Negative for congestion, ear discharge and sinus pressure.   Eyes: Negative for discharge.  Respiratory: Negative for cough.   Cardiovascular: Negative for chest pain.  Gastrointestinal: Negative for abdominal pain and diarrhea.  Genitourinary: Negative for frequency and hematuria.  Musculoskeletal: Negative for back pain.       Patient generally weak walking. Also has weakness in her left arm has 4/5  Skin: Negative for rash.  Neurological: Positive for weakness.  Negative for seizures and headaches.  Psychiatric/Behavioral: Negative for hallucinations.      Allergies  Morphine and related and Tramadol  Home Medications   Prior to Admission medications   Medication Sig Start Date End Date Taking? Authorizing Provider  amLODipine (NORVASC) 5 MG tablet Take 1 tablet (5 mg total) by mouth daily. 11/18/15   Binnie Rail, MD  losartan (COZAAR) 25 MG tablet Take 2 tablets (50 mg total) by mouth every morning. Reported on 11/08/2015 11/18/15   Binnie Rail, MD  traZODone (DESYREL) 50 MG tablet Take 1-2 tablets (50-100 mg total) by mouth at bedtime as needed for sleep. 11/08/15   Binnie Rail, MD   BP 152/88 mmHg  Pulse 67  Resp 16  SpO2 100% Physical Exam  Constitutional: She is oriented to person, place, and time. She appears well-developed.  HENT:  Head: Normocephalic.  Eyes: Conjunctivae and EOM are normal. No scleral icterus.  Neck: Neck supple. No thyromegaly present.  Cardiovascular: Normal rate and regular rhythm.  Exam reveals no gallop and no friction rub.   No murmur heard. Pulmonary/Chest: No stridor. She has no wheezes. She has no rales. She exhibits no tenderness.  Abdominal: She exhibits no distension. There is no tenderness. There is no rebound.  Musculoskeletal: Normal range of motion. She exhibits no edema.  Lymphadenopathy:    She has no cervical adenopathy.  Neurological: She is oriented to person, place, and time. She exhibits normal muscle tone. Coordination normal.  Skin: No rash noted. No erythema.  Psychiatric: She has a normal mood and affect. Her behavior is normal.    ED Course  Procedures (including critical care time) Labs Review Labs Reviewed  PROTIME-INR - Abnormal; Notable for the following:    Prothrombin Time 15.4 (*)    All other components within normal limits  CBC - Abnormal; Notable for the following:    RBC 5.40 (*)    MCV 70.0 (*)    MCH 22.4 (*)    All other components within normal limits   COMPREHENSIVE METABOLIC PANEL - Abnormal; Notable for the following:    Glucose, Bld 101 (*)    ALT 12 (*)    Alkaline Phosphatase 140 (*)    All other components within normal limits  I-STAT CHEM 8, ED - Abnormal; Notable for the following:    Hemoglobin 15.3 (*)    All other components within normal limits  APTT  DIFFERENTIAL  I-STAT TROPOININ, ED  CBG MONITORING, ED    Imaging Review Ct Head Wo Contrast  11/18/2015  CLINICAL DATA:  50 year old female code stroke with left upper extremity pain and numbness since 1315 hours. Initial encounter. EXAM: CT HEAD WITHOUT CONTRAST TECHNIQUE: Contiguous axial images were obtained from the base of the skull through the vertex without intravenous contrast. COMPARISON:  Brain MRI 01/17/2014.  Head CT 01/17/2014. FINDINGS: Visualized paranasal sinuses and mastoids are clear. No acute osseous abnormality identified. Visualized orbits and scalp soft tissues are within normal limits. Stable cerebral volume since 2015. No midline shift, ventriculomegaly, mass effect, evidence of mass lesion, intracranial hemorrhage or evidence of cortically based acute infarction. Gray-white matter differentiation is within normal limits throughout the brain. No suspicious intracranial vascular hyperdensity. IMPRESSION: Stable and normal noncontrast CT appearance of the brain. Study discussed by telephone with Dr. Roderic Palau on 11/18/2015 at 14:19 . Electronically Signed   By: Genevie Ann M.D.   On: 11/18/2015 14:20   Mr Jeri Cos [id number] Contrast  11/18/2015  CLINICAL DATA:  Code stroke. Blurred vision beginning 6 hours ago with some improvement. Ataxia and dizziness. 2.5 hours ago the patient began having sharp pain and numbness with weakness to the left arm. Tongue fasciculations. EXAM: MRI HEAD WITHOUT AND WITH CONTRAST TECHNIQUE: Multiplanar, multiecho pulse sequences of the brain and surrounding structures were obtained without and with intravenous contrast. CONTRAST:  90 mL MultiHance  COMPARISON:  CT head without contrast from the same day. MRI brain 01/17/2014. FINDINGS: 4 or 5 scattered bilateral subcortical T2 hyperintensities are similar to the prior study a 4 mm lesion posteriorly in the right frontal lobe may be new. No acute infarct, hemorrhage, or mass lesion is present. The ventricles are of normal size. No significant extra-axial fluid collection is present. There is no restricted diffusion associated with these lesions. The There is no significant involvement of the colossal septal margin. Internal auditory canals are within normal limits. The brainstem and cerebellum are normal. Flow is present in the major intracranial arteries. The globes and orbits are intact. The paranasal sinuses and mastoid air cells are clear. The postcontrast images demonstrate no pathologic enhancement. IMPRESSION: 1. No evidence for acute or subacute infarction. 2. 4 or 5 scattered subcortical T2 hyperintense foci are similar to the prior exam. The finding is nonspecific but can be seen in the setting of chronic microvascular ischemia, a demyelinating process such as multiple sclerosis, vasculitis, complicated migraine headaches, or as the sequelae of a prior infectious or inflammatory process. 3. A single new  4 mm T2 hyperintensity is present in the posterior right frontal lobe white matter. This may represent some progression of disease. Electronically Signed   By: San Morelle M.D.   On: 11/18/2015 15:44   I have personally reviewed and evaluated these images and lab results as part of my medical decision-making.   EKG Interpretation   Date/Time:  Monday November 18 2015 13:50:31 EDT Ventricular Rate:  72 PR Interval:  153 QRS Duration: 93 QT Interval:  396 QTC Calculation: 433 R Axis:   26 Text Interpretation:  Sinus rhythm Confirmed by Dublin Cantero  MD, Broadus John [phone])  on 11/18/2015 4:23:10 PM      MDM   Final diagnoses:  Migraine variant    I spoke with the hospital neurologist  and he recommended getting an MRI. The MRI was negative for stroke but did have some areas that were suggestive of vasculitis or complicated migraine. I spoke with neurologist and the patient will follow-up as an outpatient  After patient returned from MRI her weakness in her left arm had improved and her ataxia was improving  Milton Ferguson, MD 11/18/15 1636  Milton Ferguson, MD 01/30/16 1328

## 2015-11-18 NOTE — Telephone Encounter (Signed)
Spoke with pt to inform. Pt stated that she experienced a sharp pain down her arm and weakness after she called into the office and went to [facility] ED.

## 2015-11-18 NOTE — ED Notes (Signed)
Pt to MRI

## 2015-11-18 NOTE — ED Notes (Signed)
Pt back from MRI at this time

## 2015-11-18 NOTE — Discharge Instructions (Signed)
Follow up with your family md this week.  Rest at home today

## 2015-11-18 NOTE — ED Notes (Signed)
Pt woke up feeling normal, about 0930 blurred vision occurred, which has improved some but not resolved, along with ataxia, dizziness. At 1310 pt began having sharp pain with numbness, weakness to left arm. Tongue fasciculations. Left side pronator drift and weakness.

## 2015-11-18 NOTE — Telephone Encounter (Signed)
Pt called in and said that her bp is 169/102.  She has been on her bp meds for a week and it is not coming down.  What should she do?    Feeling dizzy and blurred eyes

## 2015-11-18 NOTE — ED Notes (Signed)
Pt to CT scan at this time.

## 2015-11-18 NOTE — Telephone Encounter (Signed)
PLease advise

## 2015-11-18 NOTE — Telephone Encounter (Signed)
Increase losartan to 50 mg daily (start taking 2 pills daily).  Also start amlodipine 5 mg daily (sent to pharmacy).  The end of this week or next week she should come in and have fasting blood work (ordered) so we can check her kidney function and other basic blood work.

## 2015-11-19 ENCOUNTER — Ambulatory Visit (AMBULATORY_SURGERY_CENTER): Payer: Self-pay

## 2015-11-19 VITALS — Ht 61.0 in | Wt 204.2 lb

## 2015-11-19 DIAGNOSIS — Z1211 Encounter for screening for malignant neoplasm of colon: Secondary | ICD-10-CM

## 2015-11-19 MED ORDER — SUPREP BOWEL PREP KIT 17.5-3.13-1.6 GM/177ML PO SOLN: 1.0000 | Freq: Once | ORAL | Status: DC

## 2015-11-19 NOTE — Progress Notes (Signed)
No allergies to eggs or soy No diet/weight loss meds No home oxygen No past problems with anesthesia  Has email and internet; registered emmi

## 2015-11-22 ENCOUNTER — Telehealth: Payer: Self-pay | Admitting: Internal Medicine

## 2015-11-22 NOTE — Telephone Encounter (Signed)
We are trying to reach this pt to give her a sample kit for her procedure. We do not have any Suprep samples in the office. We need to change her prep to Movi prep. Pt will have to come to the office to pick up her sample and new directions.

## 2015-11-27 ENCOUNTER — Encounter: Payer: Self-pay | Admitting: Internal Medicine

## 2015-11-27 ENCOUNTER — Ambulatory Visit (AMBULATORY_SURGERY_CENTER): Payer: BLUE CROSS/BLUE SHIELD | Admitting: Internal Medicine

## 2015-11-27 VITALS — BP 145/83 | HR 71 | Temp 97.5°F | Resp 15 | Ht 61.0 in | Wt 204.0 lb

## 2015-11-27 DIAGNOSIS — D122 Benign neoplasm of ascending colon: Secondary | ICD-10-CM | POA: Diagnosis not present

## 2015-11-27 DIAGNOSIS — D120 Benign neoplasm of cecum: Secondary | ICD-10-CM | POA: Diagnosis not present

## 2015-11-27 DIAGNOSIS — D125 Benign neoplasm of sigmoid colon: Secondary | ICD-10-CM | POA: Diagnosis not present

## 2015-11-27 DIAGNOSIS — Z1211 Encounter for screening for malignant neoplasm of colon: Secondary | ICD-10-CM | POA: Diagnosis present

## 2015-11-27 DIAGNOSIS — K635 Polyp of colon: Secondary | ICD-10-CM | POA: Diagnosis not present

## 2015-11-27 MED ORDER — SODIUM CHLORIDE 0.9 % IV SOLN: 500.0000 mL | INTRAVENOUS | Status: DC

## 2015-11-27 NOTE — Op Note (Signed)
[facility] Patient Name: Jackie Reed Procedure Date: 11/27/2015 12:51 PM MRN: [id number] Endoscopist: Docia Chuck. Henrene Pastor , MD Age: 50 Referring MD:  Date of Birth: Jul 29, 1966 Gender: Female Procedure:                Colonoscopy x 2 Indications:              Screening for colorectal malignant neoplasm Medicines:                Monitored Anesthesia Care Procedure:                Pre-Anesthesia Assessment:                           - Prior to the procedure, a History and Physical                            was performed, and patient medications and                            allergies were reviewed. The patient's tolerance of                            previous anesthesia was also reviewed. The risks                            and benefits of the procedure and the sedation                            options and risks were discussed with the patient.                            All questions were answered, and informed consent                            was obtained. Prior Anticoagulants: The patient has                            taken no previous anticoagulant or antiplatelet                            agents. ASA Grade Assessment: II - A patient with                            mild systemic disease. After reviewing the risks                            and benefits, the patient was deemed in                            satisfactory condition to undergo the procedure.                           After obtaining informed consent, the colonoscope  was passed under direct vision. Throughout the                            procedure, the patient's blood pressure, pulse, and                            oxygen saturations were monitored continuously. The                            Model CF-HQ190L [id number]) scope was introduced                            through the anus and advanced to the the cecum,                            identified by appendiceal orifice and  ileocecal                            valve. The colonoscopy was performed without                            difficulty. The patient tolerated the procedure                            well. The quality of the bowel preparation was                            excellent. The bowel preparation used was MoviPrep.                            The ileocecal valve, appendiceal orifice, and                            rectum were photographed. Scope In: 1:02:56 PM Scope Out: 1:16:44 PM Scope Withdrawal Time: 0 hours 10 minutes 47 seconds  Total Procedure Duration: 0 hours 13 minutes 48 seconds  Findings:      The digital rectal exam was normal.      Two polyps were found in the sigmoid colon and cecum. The polyps were 1       to 4 mm in size. These polyps were removed with a cold snare. Resection       and retrieval were complete.      A few medium-mouthed diverticula were found in the sigmoid colon.      The exam was otherwise without abnormality on direct and retroflexion       views. Complications:            No immediate complications. Estimated Blood Loss:     Estimated blood loss: none. Impression:               - Two 1 to 4 mm polyps in the sigmoid colon and in                            the cecum, removed with a cold snare. Resected and  retrieved.                           - Diverticulosis in the sigmoid colon.                           - The examination was otherwise normal on direct                            and retroflexion views. Recommendation:           - Patient has a contact number available for                            emergencies. The signs and symptoms of potential                            delayed complications were discussed with the                            patient. Return to normal activities tomorrow.                            Written discharge instructions were provided to the                            patient.                            - Resume previous diet.                           - Continue present medications.                           - Await pathology results.                           - Repeat colonoscopy in 5-10 years for                            surveillance, pending pathology results. Docia Chuck. Henrene Pastor, MD 11/27/2015 1:30:08 PM This report has been signed electronically. Number of Addenda: 0 CC Letter to:             Binnie Rail Referring MD:      Binnie Rail

## 2015-11-27 NOTE — Progress Notes (Signed)
Called to room to assist during endoscopic procedure.  Patient ID and intended procedure confirmed with present staff. Received instructions for my participation in the procedure from the performing physician.  

## 2015-11-27 NOTE — Patient Instructions (Signed)
Colon polyps removed today. Diverticulosis seen. Handouts given on polyps, & diverticulosis.  Result letter in your mail in 2-3 weeks. Resume current medications. Call us with any questions or concerns. Thank you!  YOU HAD AN ENDOSCOPIC PROCEDURE TODAY AT [facility]:   Refer to the procedure report that was given to you for any specific questions about what was found during the examination.  If the procedure report does not answer your questions, please call your gastroenterologist to clarify.  If you requested that your care partner not be given the details of your procedure findings, then the procedure report has been included in a sealed envelope for you to review at your convenience later.  YOU SHOULD EXPECT: Some feelings of bloating in the abdomen. Passage of more gas than usual.  Walking can help get rid of the air that was put into your GI tract during the procedure and reduce the bloating. If you had a lower endoscopy (such as a colonoscopy or flexible sigmoidoscopy) you may notice spotting of blood in your stool or on the toilet paper. If you underwent a bowel prep for your procedure, you may not have a normal bowel movement for a few days.  Please Note:  You might notice some irritation and congestion in your nose or some drainage.  This is from the oxygen used during your procedure.  There is no need for concern and it should clear up in a day or so.  SYMPTOMS TO REPORT IMMEDIATELY:   Following lower endoscopy (colonoscopy or flexible sigmoidoscopy):  Excessive amounts of blood in the stool  Significant tenderness or worsening of abdominal pains  Swelling of the abdomen that is new, acute  Fever of 100F or higher   For urgent or emergent issues, a gastroenterologist can be reached at any hour by calling [phone].   DIET: Your first meal following the procedure should be a small meal and then it is ok to progress to your normal diet. Heavy or fried foods are  harder to digest and may make you feel nauseous or bloated.  Likewise, meals heavy in dairy and vegetables can increase bloating.  Drink plenty of fluids but you should avoid alcoholic beverages for 24 hours.  ACTIVITY:  You should plan to take it easy for the rest of today and you should NOT DRIVE or use heavy machinery until tomorrow (because of the sedation medicines used during the test).    FOLLOW UP: Our staff will call the number listed on your records the next business day following your procedure to check on you and address any questions or concerns that you may have regarding the information given to you following your procedure. If we do not reach you, we will leave a message.  However, if you are feeling well and you are not experiencing any problems, there is no need to return our call.  We will assume that you have returned to your regular daily activities without incident.  If any biopsies were taken you will be contacted by phone or by letter within the next 1-3 weeks.  Please call us at [phone] if you have not heard about the biopsies in 3 weeks.    SIGNATURES/CONFIDENTIALITY: You and/or your care partner have signed paperwork which will be entered into your electronic medical record.  These signatures attest to the fact that that the information above on your After Visit Summary has been reviewed and is understood.  Full responsibility of the confidentiality of  this discharge information lies with you and/or your care-partner.

## 2015-11-27 NOTE — Progress Notes (Signed)
No egg or soy allergy known to patient  No issues with past sedation with any surgeries  or procedures, no intubation problems  No diet pills per patient No home 02 use per patient  No blood thinners per patient  Pt denies issues with constipation   

## 2015-11-27 NOTE — Progress Notes (Signed)
A/ox3, pleased with MAC, report to RN 

## 2015-11-28 ENCOUNTER — Telehealth: Payer: Self-pay | Admitting: *Deleted

## 2015-11-28 NOTE — Telephone Encounter (Signed)
Patient had her procedure yesterday.

## 2015-11-28 NOTE — Telephone Encounter (Signed)
No answer, message left for the patient. 

## 2015-11-29 ENCOUNTER — Encounter: Payer: BLUE CROSS/BLUE SHIELD | Admitting: Internal Medicine

## 2015-11-29 NOTE — Progress Notes (Signed)
    Subjective:    Patient ID: Jackie Reed, female    DOB: 12/12/1965, 50 y.o.   MRN: [id number]  HPI error   Medications and allergies reviewed with patient and updated if appropriate.  Patient Active Problem List   Diagnosis Date Noted  . Essential hypertension, benign 11/08/2015  . Tendon calcification 11/08/2015  . Adjustment disorder with mixed anxiety and depressed mood 11/08/2015  . Insomnia 11/08/2015    Current Outpatient Prescriptions on File Prior to Visit  Medication Sig Dispense Refill  . amLODipine (NORVASC) 5 MG tablet Take 1 tablet (5 mg total) by mouth daily. 90 tablet 3  . losartan (COZAAR) 25 MG tablet Take 2 tablets (50 mg total) by mouth every morning. Reported on 11/08/2015 90 tablet 1  . traZODone (DESYREL) 50 MG tablet Take 1-2 tablets (50-100 mg total) by mouth at bedtime as needed for sleep. 60 tablet 3   No current facility-administered medications on file prior to visit.    Past Medical History  Diagnosis Date  . Migraine   . Plantar fibromatosis   . Hypertension     Past Surgical History  Procedure Laterality Date  . Partial hysterectomy    . Foot surgery      Social History   Social History  . Marital Status: Widowed    Spouse Name: N/A  . Number of Children: N/A  . Years of Education: N/A   Social History Main Topics  . Smoking status: Never Smoker   . Smokeless tobacco: Never Used  . Alcohol Use: No  . Drug Use: No  . Sexual Activity: Yes    Birth Control/ Protection: None   Other Topics Concern  . Not on file   Social History Narrative    Family History  Problem Relation Age of Onset  . Heart failure Mother   . Diabetes Mother   . Heart failure Father   . Hypertension Father   . Hyperlipidemia Father   . Colon cancer Neg Hx   . Colon polyps Neg Hx     Review of Systems     Objective:  There were no vitals filed for this visit. There were no vitals filed for this visit. There is no weight on file to  calculate BMI.   Physical Exam        Assessment & Plan:    This encounter was created in error - please disregard.

## 2015-12-02 ENCOUNTER — Encounter: Payer: Self-pay | Admitting: Internal Medicine

## 2015-12-10 ENCOUNTER — Ambulatory Visit: Payer: BLUE CROSS/BLUE SHIELD | Admitting: Licensed Clinical Social Worker

## 2015-12-16 ENCOUNTER — Ambulatory Visit (INDEPENDENT_AMBULATORY_CARE_PROVIDER_SITE_OTHER): Payer: BLUE CROSS/BLUE SHIELD | Admitting: Licensed Clinical Social Worker

## 2015-12-16 DIAGNOSIS — F411 Generalized anxiety disorder: Secondary | ICD-10-CM

## 2015-12-24 ENCOUNTER — Ambulatory Visit: Payer: BLUE CROSS/BLUE SHIELD | Admitting: Licensed Clinical Social Worker

## 2017-03-29 ENCOUNTER — Emergency Department (HOSPITAL_COMMUNITY)
Admission: EM | Admit: 2017-03-29 | Discharge: 2017-03-30 | Disposition: A | Discharge status: Home / Self Care | Payer: Self-pay | Attending: Physician Assistant | Admitting: Physician Assistant

## 2017-03-29 ENCOUNTER — Encounter (HOSPITAL_COMMUNITY): Payer: Self-pay | Admitting: Emergency Medicine

## 2017-03-29 ENCOUNTER — Emergency Department (HOSPITAL_COMMUNITY): Payer: Self-pay

## 2017-03-29 DIAGNOSIS — G43909 Migraine, unspecified, not intractable, without status migrainosus: Secondary | ICD-10-CM

## 2017-03-29 DIAGNOSIS — I10 Essential (primary) hypertension: Secondary | ICD-10-CM | POA: Insufficient documentation

## 2017-03-29 DIAGNOSIS — R0602 Shortness of breath: Secondary | ICD-10-CM | POA: Insufficient documentation

## 2017-03-29 DIAGNOSIS — G43009 Migraine without aura, not intractable, without status migrainosus: Secondary | ICD-10-CM | POA: Insufficient documentation

## 2017-03-29 LAB — CBC WITH DIFFERENTIAL/PLATELET
Basophils Absolute: 0 10*3/uL (ref 0.0–0.1)
Basophils Relative: 0 %
Eosinophils Absolute: 0.3 10*3/uL (ref 0.0–0.7)
Eosinophils Relative: 5 %
HCT: 37.2 % (ref 36.0–46.0)
Hemoglobin: 11.8 g/dL — ABNORMAL LOW (ref 12.0–15.0)
Lymphocytes Relative: 36 %
Lymphs Abs: 2.4 10*3/uL (ref 0.7–4.0)
MCH: 22.3 pg — ABNORMAL LOW (ref 26.0–34.0)
MCHC: 31.7 g/dL (ref 30.0–36.0)
MCV: 70.3 fL — ABNORMAL LOW (ref 78.0–100.0)
Monocytes Absolute: 0.9 10*3/uL (ref 0.1–1.0)
Monocytes Relative: 13 %
Neutro Abs: 3 10*3/uL (ref 1.7–7.7)
Neutrophils Relative %: 46 %
Platelets: 362 10*3/uL (ref 150–400)
RBC: 5.29 MIL/uL — ABNORMAL HIGH (ref 3.87–5.11)
RDW: 15.2 % (ref 11.5–15.5)
WBC: 6.5 10*3/uL (ref 4.0–10.5)

## 2017-03-29 LAB — COMPREHENSIVE METABOLIC PANEL
ALT: 11 U/L — ABNORMAL LOW (ref 14–54)
AST: 22 U/L (ref 15–41)
Albumin: 3.8 g/dL (ref 3.5–5.0)
Alkaline Phosphatase: 138 U/L — ABNORMAL HIGH (ref 38–126)
Anion gap: 10 (ref 5–15)
BUN: 10 mg/dL (ref 6–20)
CO2: 23 mmol/L (ref 22–32)
Calcium: 9.2 mg/dL (ref 8.9–10.3)
Chloride: 105 mmol/L (ref 101–111)
Creatinine, Ser: 0.78 mg/dL (ref 0.44–1.00)
GFR calc Af Amer: >60 mL/min (ref >60)
GFR calc non Af Amer: >60 mL/min (ref >60)
Glucose, Bld: 89 mg/dL (ref 65–99)
Potassium: 4 mmol/L (ref 3.5–5.1)
Sodium: 138 mmol/L (ref 135–145)
Total Bilirubin: 0.6 mg/dL (ref 0.3–1.2)
Total Protein: 7.6 g/dL (ref 6.5–8.1)

## 2017-03-29 LAB — BRAIN NATRIURETIC PEPTIDE: B Natriuretic Peptide: 41.6 pg/mL (ref 0.0–100.0)

## 2017-03-29 LAB — I-STAT TROPONIN, ED: Troponin i, poc: 0 ng/mL (ref 0.00–0.08)

## 2017-03-29 MED ORDER — DIPHENHYDRAMINE HCL 50 MG/ML IJ SOLN
25.0000 mg | Freq: Once | INTRAMUSCULAR | Status: AC
  Administered 2017-03-29: 25 mg via INTRAVENOUS
  Filled 2017-03-29: qty 1

## 2017-03-29 MED ORDER — SODIUM CHLORIDE 0.9 % IV BOLUS (SEPSIS)
1000.0000 mL | Freq: Once | INTRAVENOUS | Status: AC
  Administered 2017-03-29: 1000 mL via INTRAVENOUS

## 2017-03-29 MED ORDER — PROCHLORPERAZINE EDISYLATE 5 MG/ML IJ SOLN
10.0000 mg | Freq: Once | INTRAMUSCULAR | Status: AC
  Administered 2017-03-29: 10 mg via INTRAVENOUS
  Filled 2017-03-29: qty 2

## 2017-03-29 NOTE — ED Provider Notes (Signed)
[facility] DEPT Provider Note   CSN: [id number] Arrival date & time: 03/29/17  1503     History   Chief Complaint Chief Complaint  Patient presents with  . Shortness of Breath  . Headache    HPI RONNA HERSKOWITZ is a 51 y.o. female.  HPI   Patient's 51 year old female. She is presenting with several issues. She reports occasionally she gets shortness of breath. She reports that she currently has a headache and it goes down both areas of her neck. She also reports she has a little bit of numbness in her left arm. She feels like headaches been going on for several days. Patient has history of complex Migraine with symptoms into her left arm. She was seen for this previously. Patient has no primary care physician. She reports that sometimes with high blood pressure she also get these symptoms. She's currently not on any blood blood pressure medication. Has no chest pain currently.  Past Medical History:  Diagnosis Date  . Hypertension   . Migraine   . Plantar fibromatosis     Patient Active Problem List   Diagnosis Date Noted  . Essential hypertension, benign 11/08/2015  . Tendon calcification 11/08/2015  . Adjustment disorder with mixed anxiety and depressed mood 11/08/2015  . Insomnia 11/08/2015    Past Surgical History:  Procedure Laterality Date  . FOOT SURGERY    . PARTIAL HYSTERECTOMY      OB History    No data available       Home Medications    Prior to Admission medications   Not on File    Family History Family History  Problem Relation Age of Onset  . Heart failure Mother   . Diabetes Mother   . Heart failure Father   . Hypertension Father   . Hyperlipidemia Father   . Colon cancer Neg Hx   . Colon polyps Neg Hx     Social History Social History  Substance Use Topics  . Smoking status: Never Smoker  . Smokeless tobacco: Never Used  . Alcohol use No     Allergies   Morphine and related and Tramadol   Review of Systems Review of  Systems  Constitutional: Negative for activity change and fever.  HENT: Positive for congestion.   Respiratory: Positive for chest tightness. Negative for shortness of breath.   Cardiovascular: Negative for chest pain.  Gastrointestinal: Negative for abdominal pain.  Genitourinary: Negative for dysuria.  Neurological: Positive for headaches. Negative for dizziness, speech difficulty and weakness.  All other systems reviewed and are negative.    Physical Exam Updated Vital Signs BP 130/78   Pulse 66   Temp 98.6 F (37 C) (Oral)   Resp 18   Ht 5\' 5"  (1.651 m)   Wt 92.1 kg (203 lb)   SpO2 98%   BMI 33.78 kg/m   Physical Exam  Constitutional: She is oriented to person, place, and time. She appears well-developed and well-nourished.  HENT:  Head: Normocephalic and atraumatic.  Eyes: Pupils are equal, round, and reactive to light. EOM are normal. Right eye exhibits no discharge. Left eye exhibits no discharge.  Cardiovascular: Normal rate and regular rhythm.   Pulmonary/Chest: Effort normal and breath sounds normal. No respiratory distress.  Abdominal: Soft. She exhibits no distension. There is no tenderness.  Musculoskeletal:  Pain in the musculature in the bilateral trapezius area.  Neurological: She is oriented to person, place, and time.  Equal strength bilaterally upper and lower extremities negative pronator  drift. Normal sensation bilaterally. Speech comprehensible, no slurring. Facial nerve tested and appears grossly normal. Alert and oriented 3.  Equal strength in bilateral upper extremities.   Skin: Skin is warm and dry. She is not diaphoretic.  Psychiatric: She has a normal mood and affect.  Nursing note and vitals reviewed.    ED Treatments / Results  Labs (all labs ordered are listed, but only abnormal results are displayed) Labs Reviewed  COMPREHENSIVE METABOLIC PANEL - Abnormal; Notable for the following:       Result Value   ALT 11 (*)    Alkaline  Phosphatase 138 (*)    All other components within normal limits  CBC WITH DIFFERENTIAL/PLATELET - Abnormal; Notable for the following:    RBC 5.29 (*)    Hemoglobin 11.8 (*)    MCV 70.3 (*)    MCH 22.3 (*)    All other components within normal limits  BRAIN NATRIURETIC PEPTIDE  I-STAT TROPONIN, ED    EKG  EKG Interpretation  Date/Time:  Monday March 29 2017 15:12:18 EDT Ventricular Rate:  68 PR Interval:  152 QRS Duration: 78 QT Interval:  410 QTC Calculation: 435 R Axis:   32 Text Interpretation:  Normal sinus rhythm Possible Anterior infarct , age undetermined Abnormal ECG Normal sinus rhythm Confirmed by Zenovia Jarred [phone]) on 03/29/2017 7:55:18 PM       Radiology Dg Chest 2 View  Result Date: 03/29/2017 CLINICAL DATA:  51 year old female with history of shortness of breath for the past 3 days. History of hypertension. EXAM: CHEST  2 VIEW COMPARISON:  Chest x-ray 06/26/2015. FINDINGS: Lung volumes are normal. No consolidative airspace disease. No pleural effusions. No pneumothorax. No pulmonary nodule or mass noted. Pulmonary vasculature and the cardiomediastinal silhouette are within normal limits. IMPRESSION: No radiographic evidence of acute cardiopulmonary disease. Electronically Signed   By: Vinnie Langton M.D.   On: 03/29/2017 16:25    Procedures Procedures (including critical care time)  Medications Ordered in ED Medications  prochlorperazine (COMPAZINE) injection 10 mg (10 mg Intravenous Given 03/29/17 2042)  diphenhydrAMINE (BENADRYL) injection 25 mg (25 mg Intravenous Given 03/29/17 2042)  sodium chloride 0.9 % bolus 1,000 mL (1,000 mLs Intravenous New Bag/Given 03/29/17 2042)     Initial Impression / Assessment and Plan / ED Course  I have reviewed the triage vital signs and the nursing notes.  Pertinent labs & imaging results that were available during my care of the patient were reviewed by me and considered in my medical decision making (see chart for  details).     Patient here with several complaints. Patient has history of complex migraine with left arm symptoms. She is here with the same today. We'll treat her migraine. In addition we'll do a troponin to rule out any cardiac etiology, as it has been going on for a long period of time. EKG is nonischemic. We will also refer patient to primary care physician to follow-up about her blood pressure. We'll do chest x-ray to evaluate for her shortness of breath.  11:18 PM Headache is resolved. Labs reassuring.  See no indication for inpatient admission currently. Will have h3er follow up and establish care with a PCP.   Final Clinical Impressions(s) / ED Diagnoses   Final diagnoses:  None    New Prescriptions New Prescriptions   No medications on file     Macarthur Critchley, MD 03/29/17 2318

## 2017-03-29 NOTE — ED Triage Notes (Signed)
Pt c/o headache that started Saturday night, today pain began radiating down her neck and to her arms. C/o sensitivity to light and sounds as well. Pt also has shortness of breath with exertion, especially walking upstairs that has been going on "for a while".

## 2017-03-30 NOTE — Discharge Instructions (Signed)
You have found to have a migraine today. Please follow-up with a primary care physician.You will likely need blood pressure medications as well. Please follow-up with the numbers below.  To find a primary care or specialty doctor please call [phone] or [phone] to access "[facility] Doctor Service."  You may also go on the [facility] website at [email or web address]  There are also multiple [facility], [facility] and [facility] practices throughout the [place] that are frequently accepting new patients. You may find a clinic that is close to your home and contact them.  [facility] -  [address] [place] [address] [phone]  [facility] in [place] (also locations in [place] and [place]) -  [place] [address] [address]  [place] [address] [phone]

## 2017-03-30 NOTE — ED Notes (Signed)
Pt verbalized understanding of d/c instructions and has no further questions. Pt is stable, A&Ox4, VSS.  

## 2017-05-28 ENCOUNTER — Ambulatory Visit (INDEPENDENT_AMBULATORY_CARE_PROVIDER_SITE_OTHER): Payer: Self-pay | Admitting: Emergency Medicine

## 2017-05-28 DIAGNOSIS — Z111 Encounter for screening for respiratory tuberculosis: Secondary | ICD-10-CM

## 2017-05-30 LAB — TB SKIN TEST
Induration: 14 mm
TB Skin Test: POSITIVE

## 2017-05-30 NOTE — Progress Notes (Signed)
Patient returned PPD reading.  Provider concerned that  inconclusive results may be from allergic reaction to injection vs. Latent TB or exposure. Patient was referred to her PCP, [facility] of [facility] for further evaluation. Patient was advised chest xray is required to rule in/out and will contact PCP in AM to schedule this. We spoke to [facility] and they advised [place] to send her there instead of to their office today.

## 2017-05-31 ENCOUNTER — Telehealth: Payer: Self-pay | Admitting: Internal Medicine

## 2017-05-31 NOTE — Telephone Encounter (Signed)
Patient states she went to [facility] to have a TB.  States that she had an allergic reaction.  [facility] has advised patient to get PCP to enter an order for chest x ray.  I was going to schedule patient.  Patient states right now she does not have insurance and would like to know if Dr. Quay Burow would be able to enter order without an appointment?  Please see last letter entered into patients chart by Guadelupe Sabin NP.

## 2017-05-31 NOTE — Telephone Encounter (Signed)
Per MD, pt needs to been seen for OV or go to urgent care.

## 2017-05-31 NOTE — Telephone Encounter (Signed)
Notified patient.

## 2017-06-01 ENCOUNTER — Emergency Department (HOSPITAL_COMMUNITY): Payer: BLUE CROSS/BLUE SHIELD

## 2017-06-01 ENCOUNTER — Emergency Department (HOSPITAL_COMMUNITY)
Admission: EM | Admit: 2017-06-01 | Discharge: 2017-06-01 | Disposition: A | Discharge status: Home / Self Care | Payer: BLUE CROSS/BLUE SHIELD | Attending: Emergency Medicine | Admitting: Emergency Medicine

## 2017-06-01 ENCOUNTER — Encounter (HOSPITAL_COMMUNITY): Payer: Self-pay

## 2017-06-01 DIAGNOSIS — I10 Essential (primary) hypertension: Secondary | ICD-10-CM | POA: Insufficient documentation

## 2017-06-01 DIAGNOSIS — R7611 Nonspecific reaction to tuberculin skin test without active tuberculosis: Secondary | ICD-10-CM | POA: Insufficient documentation

## 2017-06-01 NOTE — Discharge Instructions (Signed)
Your TB skin test was positive but your Chest X-Ray was normal. I spoke with the infectious disease doctor and he states that You will need to follow up with your primary care doctor and they may want to do HIV screening.

## 2017-06-01 NOTE — ED Provider Notes (Signed)
[facility] DEPT Provider Note   CSN: [id number] Arrival date & time: 06/01/17  1024     History   Chief Complaint Chief Complaint  Patient presents with  . arm redness  . TB site blistered.    HPI Jackie Reed is a 51 y.o. female who presents to the ED with a positive TB skin test. She reports that she had the test done to start a job. She has had them in the past and have always been negative. She went back to have it read and they told her she needed to come to have a CXR. Patient has no symptoms. Patient called her PCP but they could not see her so she came to the ED.   HPI  Past Medical History:  Diagnosis Date  . Hypertension   . Migraine   . Plantar fibromatosis     Patient Active Problem List   Diagnosis Date Noted  . Essential hypertension, benign 11/08/2015  . Tendon calcification 11/08/2015  . Adjustment disorder with mixed anxiety and depressed mood 11/08/2015  . Insomnia 11/08/2015    Past Surgical History:  Procedure Laterality Date  . FOOT SURGERY    . PARTIAL HYSTERECTOMY      OB History    No data available       Home Medications    Prior to Admission medications   Not on File    Family History Family History  Problem Relation Age of Onset  . Heart failure Mother   . Diabetes Mother   . Heart failure Father   . Hypertension Father   . Hyperlipidemia Father   . Colon cancer Neg Hx   . Colon polyps Neg Hx     Social History Social History  Substance Use Topics  . Smoking status: Never Smoker  . Smokeless tobacco: Never Used  . Alcohol use No     Allergies   Morphine and related and Tramadol   Review of Systems Review of Systems  Constitutional: Negative for chills and fever.  HENT: Negative.   Eyes: Negative for visual disturbance.  Respiratory: Negative for cough and shortness of breath.   Gastrointestinal: Negative for nausea and vomiting.  Skin: Positive for wound (at site of TB test). Negative for rash.      Physical Exam Updated Vital Signs BP (!) 145/90   Pulse 73   Temp 98.3 F (36.8 C)   Resp 16   Ht 5\' 5"  (1.651 m)   Wt 94.8 kg (209 lb)   SpO2 98%   BMI 34.78 kg/m   Physical Exam  Constitutional: She is oriented to person, place, and time. She appears well-developed and well-nourished. No distress.  HENT:  Head: Normocephalic.  Eyes: EOM are normal.  Neck: Neck supple.  Cardiovascular: Normal rate and regular rhythm.   Pulmonary/Chest: Effort normal and breath sounds normal.  Musculoskeletal: Normal range of motion.  Neurological: She is alert and oriented to person, place, and time. No cranial nerve deficit.  Skin:  2 cm raised, red blister area to the forearm where TB skin test was placed. Mild area of erythema surrounding the blister. No red streaking.   Psychiatric: She has a normal mood and affect.  Nursing note and vitals reviewed.    ED Treatments / Results  Labs (all labs ordered are listed, but only abnormal results are displayed) Labs Reviewed - No data to display  Radiology Dg Chest 2 View  Result Date: 06/01/2017 CLINICAL DATA:  Positive TB skin test.  EXAM: CHEST  2 VIEW COMPARISON:  03/29/2017 FINDINGS: The cardiac silhouette, mediastinal and hilar contours are normal and stable. The lungs are clear. No pleural effusion. No calcified granulomas. The bony thorax is intact. IMPRESSION: No acute cardiopulmonary findings. Electronically Signed   By: Marijo Sanes M.D.   On: 06/01/2017 11:48    Procedures Procedures (including critical care time)  Medications Ordered in ED Medications - No data to display   Initial Impression / Assessment and Plan / ED Course  I have reviewed the triage vital signs and the nursing notes. Consult with Dr. Linus Salmons, ID and patient does not need any additional work up here in the ED. Patient to f/u with her PCP where they may want to do HIV screening. Patient stable for d/c without respiratory symptoms and normal CXR. I  discussed in detail with the patient plan of care and need for f/u with her PCP.    Final Clinical Impressions(s) / ED Diagnoses   Final diagnoses:  Positive TB test    New Prescriptions New Prescriptions   No medications on file     Ashley Murrain, NP 06/01/17 1434    Forde Dandy, MD 06/01/17 [phone]

## 2017-06-01 NOTE — ED Notes (Signed)
Bed: WTR7 Expected date:  Expected time:  Means of arrival:  Comments: 

## 2017-06-01 NOTE — ED Triage Notes (Signed)
Patient states she had a TB shot to the left anterior forearm. Patient has redness and swelling to the area. The actual injection site is blistered.

## 2017-06-25 ENCOUNTER — Encounter: Payer: Self-pay | Admitting: Family Medicine

## 2017-06-28 ENCOUNTER — Ambulatory Visit: Payer: Self-pay | Admitting: Family Medicine

## 2017-08-24 ENCOUNTER — Encounter (HOSPITAL_COMMUNITY): Payer: Self-pay | Admitting: Emergency Medicine

## 2017-08-24 DIAGNOSIS — R51 Headache: Secondary | ICD-10-CM | POA: Diagnosis not present

## 2017-08-24 DIAGNOSIS — I10 Essential (primary) hypertension: Secondary | ICD-10-CM | POA: Insufficient documentation

## 2017-08-24 DIAGNOSIS — H538 Other visual disturbances: Secondary | ICD-10-CM | POA: Insufficient documentation

## 2017-08-24 NOTE — ED Triage Notes (Signed)
Patient c/o headache in the back of head x1 week. Reports pain is radiating to neck and shoulders. States "the pain is affecting my vision." Reports blurred vision in bilateral eyes. Denies weakness and dizziness. Ambulatory. Reports taking ibuprofen x3 days with no relief.

## 2017-08-25 ENCOUNTER — Emergency Department (HOSPITAL_COMMUNITY): Payer: BLUE CROSS/BLUE SHIELD

## 2017-08-25 ENCOUNTER — Emergency Department (HOSPITAL_COMMUNITY)
Admission: EM | Admit: 2017-08-25 | Discharge: 2017-08-25 | Disposition: A | Discharge status: Home / Self Care | Payer: BLUE CROSS/BLUE SHIELD | Attending: Emergency Medicine | Admitting: Emergency Medicine

## 2017-08-25 DIAGNOSIS — R519 Headache, unspecified: Secondary | ICD-10-CM

## 2017-08-25 DIAGNOSIS — R51 Headache: Secondary | ICD-10-CM

## 2017-08-25 MED ORDER — DIPHENHYDRAMINE HCL 50 MG/ML IJ SOLN
25.0000 mg | Freq: Once | INTRAMUSCULAR | Status: AC
  Administered 2017-08-25: 25 mg via INTRAVENOUS
  Filled 2017-08-25: qty 1

## 2017-08-25 MED ORDER — PROCHLORPERAZINE EDISYLATE 5 MG/ML IJ SOLN
10.0000 mg | Freq: Once | INTRAMUSCULAR | Status: AC
  Administered 2017-08-25: 10 mg via INTRAVENOUS
  Filled 2017-08-25: qty 2

## 2017-08-25 MED ORDER — KETOROLAC TROMETHAMINE 30 MG/ML IJ SOLN
30.0000 mg | Freq: Once | INTRAMUSCULAR | Status: AC
  Administered 2017-08-25: 30 mg via INTRAVENOUS
  Filled 2017-08-25: qty 1

## 2017-08-25 MED ORDER — SODIUM CHLORIDE 0.9 % IV BOLUS (SEPSIS)
1000.0000 mL | Freq: Once | INTRAVENOUS | Status: AC
Start: 2017-08-25 — End: 2017-08-25
  Administered 2017-08-25: 1000 mL via INTRAVENOUS

## 2017-08-25 NOTE — Discharge Instructions (Signed)
Return here as needed.  Your head CT did not show any abnormalities.  Follow-up with your primary doctor.

## 2017-08-25 NOTE — ED Provider Notes (Signed)
[facility] DEPT Provider Note   CSN: [id number] Arrival date & time: 08/24/17  2020     History   Chief Complaint Chief Complaint  Patient presents with  . Headache    HPI Jackie Reed is a 52 y.o. female.  HPI Patient presents to the emergency department with headache this been ongoing over the last 4 days.  The patient states that she feels like over the last 24 hours she has blurry vision.  Patient states that nothing seems to make the condition better and it seems worse with palpation of the back of her head.  Patient states she took ibuprofen at home without significant relief of her symptoms.  The patient denies chest pain, shortness of breath,  neck pain, fever, cough, weakness, numbness, dizziness, anorexia, edema, abdominal pain, nausea, vomiting, diarrhea, rash, back pain, dysuria, hematemesis, bloody stool, near syncope, or syncope. Past Medical History:  Diagnosis Date  . Hypertension   . Migraine   . Plantar fibromatosis     Patient Active Problem List   Diagnosis Date Noted  . Essential hypertension, benign 11/08/2015  . Tendon calcification 11/08/2015  . Adjustment disorder with mixed anxiety and depressed mood 11/08/2015  . Insomnia 11/08/2015    Past Surgical History:  Procedure Laterality Date  . FOOT SURGERY  1985  . PARTIAL HYSTERECTOMY  1997    OB History    No data available       Home Medications    Prior to Admission medications   Medication Sig Start Date End Date Taking? Authorizing Provider  ibuprofen (ADVIL,MOTRIN) 200 MG tablet Take 600 mg by mouth every 6 (six) hours as needed for moderate pain.   Yes [provider]    Family History Family History  Problem Relation Age of Onset  . Heart failure Mother   . Diabetes Mother   . Heart failure Father   . Hypertension Father   . Hyperlipidemia Father   . Colon cancer Neg Hx   . Colon polyps Neg Hx     Social History Social History    Tobacco Use  . Smoking status: Never Smoker  . Smokeless tobacco: Never Used  Substance Use Topics  . Alcohol use: No    Alcohol/week: 0.0 oz  . Drug use: No     Allergies   Morphine and related and Tramadol   Review of Systems Review of Systems  All other systems negative except as documented in the HPI. All pertinent positives and negatives as reviewed in the HPI. Physical Exam Updated Vital Signs BP 125/72   Pulse 79   Temp 98.4 F (36.9 C) (Oral)   Resp 18   SpO2 96%   Physical Exam  Constitutional: She is oriented to person, place, and time. She appears well-developed and well-nourished. No distress.  HENT:  Head: Normocephalic and atraumatic.  Mouth/Throat: Oropharynx is clear and moist.  Eyes: Pupils are equal, round, and reactive to light.  Neck: Normal range of motion. Neck supple.  Cardiovascular: Normal rate, regular rhythm and normal heart sounds. Exam reveals no gallop and no friction rub.  No murmur heard. Pulmonary/Chest: Effort normal and breath sounds normal. No respiratory distress. She has no wheezes.  Neurological: She is alert and oriented to person, place, and time. She has normal strength. She exhibits normal muscle tone. Coordination and gait normal. GCS eye subscore is 4. GCS verbal subscore is 5. GCS motor subscore is 6.  Skin: Skin is warm and dry. Capillary  refill takes less than 2 seconds. No rash noted. No erythema.  Psychiatric: She has a normal mood and affect. Her behavior is normal.  Nursing note and vitals reviewed.    ED Treatments / Results  Labs (all labs ordered are listed, but only abnormal results are displayed) Labs Reviewed - No data to display  EKG  EKG Interpretation None       Radiology Ct Head Wo Contrast  Result Date: 08/25/2017 CLINICAL DATA:  Acute onset of posterior headache, radiating to the neck and shoulders. Bilateral blurred vision. EXAM: CT HEAD WITHOUT CONTRAST TECHNIQUE: Contiguous axial images  were obtained from the base of the skull through the vertex without intravenous contrast. COMPARISON:  CT of the head, and MRI of the brain performed 11/18/2015 FINDINGS: Brain: No evidence of acute infarction, hemorrhage, hydrocephalus, extra-axial collection or mass lesion/mass effect. Mild subcortical white matter change may reflect small vessel ischemic microangiopathy. The posterior fossa, including the cerebellum, brainstem and fourth ventricle, is within normal limits. The third and lateral ventricles, and basal ganglia are unremarkable in appearance. The cerebral hemispheres are symmetric in appearance, with normal gray-white differentiation. No mass effect or midline shift is seen. Vascular: No hyperdense vessel or unexpected calcification. Skull: There is no evidence of fracture; visualized osseous structures are unremarkable in appearance. Sinuses/Orbits: The visualized portions of the orbits are within normal limits. The paranasal sinuses and mastoid air cells are well-aerated. Other: No significant soft tissue abnormalities are seen. IMPRESSION: 1. No acute intracranial pathology seen on CT. 2. Mild small vessel ischemic microangiopathy. Electronically Signed   By: Garald Balding M.D.   On: 08/25/2017 02:50    Procedures Procedures (including critical care time)  Medications Ordered in ED Medications  sodium chloride 0.9 % bolus 1,000 mL (0 mLs Intravenous Stopped 08/25/17 0356)  ketorolac (TORADOL) 30 MG/ML injection 30 mg (30 mg Intravenous Given 08/25/17 0222)  prochlorperazine (COMPAZINE) injection 10 mg (10 mg Intravenous Given 08/25/17 0223)  diphenhydrAMINE (BENADRYL) injection 25 mg (25 mg Intravenous Given 08/25/17 0224)     Initial Impression / Assessment and Plan / ED Course  I have reviewed the triage vital signs and the nursing notes.  Pertinent labs & imaging results that were available during my care of the patient were reviewed by me and considered in my medical decision making  (see chart for details).     The patient has complete resolution of her headache at this time following IV medications and fluids.  The patient most likely had a tension type headache due to the fact that she has pain in the posterior scalp region.  The patient will be discharged home and advised follow-up with the primary doctor told return here as needed.  The patient's head CT was negative due to the fact that this is a change in her headache quality from in the past and the fact that her blood pressure was initially elevated.  Her blood pressure has normalized following resolution of her symptoms.  Final Clinical Impressions(s) / ED Diagnoses   Final diagnoses:  None    ED Discharge Orders    None       Dalia Heading, PA-C 08/25/17 0420    Shanon Rosser, MD 08/25/17 [phone]

## 2017-09-03 ENCOUNTER — Inpatient Hospital Stay: Payer: Self-pay | Admitting: Internal Medicine

## 2017-09-03 ENCOUNTER — Encounter: Payer: Self-pay | Admitting: Family Medicine

## 2017-09-03 ENCOUNTER — Ambulatory Visit: Payer: Self-pay | Admitting: Family Medicine

## 2017-09-03 VITALS — BP 144/84 | HR 60 | Temp 98.7°F | Resp 16 | Ht 65.0 in | Wt 211.0 lb

## 2017-09-03 DIAGNOSIS — R519 Headache, unspecified: Secondary | ICD-10-CM

## 2017-09-03 DIAGNOSIS — Z1329 Encounter for screening for other suspected endocrine disorder: Secondary | ICD-10-CM

## 2017-09-03 DIAGNOSIS — Z87898 Personal history of other specified conditions: Secondary | ICD-10-CM

## 2017-09-03 DIAGNOSIS — Z1231 Encounter for screening mammogram for malignant neoplasm of breast: Secondary | ICD-10-CM

## 2017-09-03 DIAGNOSIS — I10 Essential (primary) hypertension: Secondary | ICD-10-CM

## 2017-09-03 DIAGNOSIS — R51 Headache: Secondary | ICD-10-CM

## 2017-09-03 DIAGNOSIS — Z131 Encounter for screening for diabetes mellitus: Secondary | ICD-10-CM

## 2017-09-03 DIAGNOSIS — Z1239 Encounter for other screening for malignant neoplasm of breast: Secondary | ICD-10-CM

## 2017-09-03 DIAGNOSIS — Z1321 Encounter for screening for nutritional disorder: Secondary | ICD-10-CM

## 2017-09-03 DIAGNOSIS — Z130 Encounter for screening for diseases of the blood and blood-forming organs and certain disorders involving the immune mechanism: Secondary | ICD-10-CM

## 2017-09-03 LAB — POCT URINALYSIS DIP (DEVICE)
Bilirubin Urine: NEGATIVE
Glucose, UA: NEGATIVE mg/dL
Hgb urine dipstick: NEGATIVE
Ketones, ur: NEGATIVE mg/dL
Nitrite: POSITIVE — AB
Protein, ur: NEGATIVE mg/dL
Specific Gravity, Urine: >=1.03 (ref 1.005–1.030)
Urobilinogen, UA: 1 mg/dL (ref 0.0–1.0)
pH: 5.5 (ref 5.0–8.0)

## 2017-09-03 LAB — POCT GLYCOSYLATED HEMOGLOBIN (HGB A1C): Hemoglobin A1C: 5.7

## 2017-09-03 MED ORDER — NAPROXEN 500 MG PO TABS: 500.0000 mg | ORAL_TABLET | Freq: Two times a day (BID) | ORAL | 0 refills | Status: DC

## 2017-09-03 MED ORDER — LOSARTAN POTASSIUM 25 MG PO TABS: 25.0000 mg | ORAL_TABLET | Freq: Every day | ORAL | 1 refills | Status: DC

## 2017-09-03 MED ORDER — CYCLOBENZAPRINE HCL 10 MG PO TABS: 10.0000 mg | ORAL_TABLET | Freq: Three times a day (TID) | ORAL | 0 refills | Status: DC | PRN

## 2017-09-03 NOTE — Progress Notes (Signed)
Patient ID: Jackie Reed, female    DOB: 11-19-1965, 52 y.o.   MRN: [id number]  PCP: Scot Jun, FNP  Chief Complaint  Patient presents with  . Establish Care  . Hospitalization Follow-up    Subjective:  HPI New Patient  Jackie Reed is a 52 y.o. female presents to establish care and recent ED follow-up.  History migraines, hypertension (untreated for over 1 year), dupuytren's fibromatoses, and positive TB skin test.  Jackie Reed has had a gap in medical care due to lack of health insurance for a period of time. For this reason she has been off of blood pressure medications for more than 1 year. Recently presented to the ED for headache and was found to be very hypertension. BP medications were not resumed. Headaches were most pronounced in the occipital region and present upon awakening. Headaches have remained present over the last two weeks and intensity has ranged from mild to moderate.  Diagnosed with hypertension several years ago and was prescribed losartan 25 mg once daily. She has a significant family history of both parents dying of massive heart attack-mother in 68's and father at age 61. She denies chest pain, dizziness, or cough. She complains of mild shortness of breath with activity which has been chronic. Denies history of asthma or smoking. In October, Jackie Reed was found to have a positive TB skin test. She reports following-up with [facility] in which she had a negative Quantiferon Gold test, negative Hep B and C, negative HIV, and negative chest x-ray. Jackie Reed requests today a referral for recurrent right palmer nodules. She has previous diagnosis of  Dupuytren's fibromatosis in which she was evaluated in 2017 by Dr. Leanora Cover at [facility]. Right hand has become increasingly painful with activity. She is requesting a referral back to Dr. Fredna Dow today.  Social History   Socioeconomic History  . Marital status: Widowed    Spouse name:  Not on file  . Number of children: Not on file  . Years of education: Not on file  . Highest education level: Not on file  Social Needs  . Financial resource strain: Not on file  . Food insecurity - worry: Not on file  . Food insecurity - inability: Not on file  . Transportation needs - medical: Not on file  . Transportation needs - non-medical: Not on file  Occupational History  . Not on file  Tobacco Use  . Smoking status: Never Smoker  . Smokeless tobacco: Never Used  Substance and Sexual Activity  . Alcohol use: No    Alcohol/week: 0.0 oz  . Drug use: No  . Sexual activity: Yes    Birth control/protection: None  Other Topics Concern  . Not on file  Social History Narrative  . Not on file    Family History  Problem Relation Age of Onset  . Heart failure Mother   . Diabetes Mother   . Heart failure Father   . Hypertension Father   . Hyperlipidemia Father   . Colon cancer Neg Hx   . Colon polyps Neg Hx    Review of Systems  Constitutional: Negative.   Eyes: Negative.   Respiratory: Negative.   Cardiovascular: Negative.   Gastrointestinal: Negative.   Genitourinary: Negative.   Musculoskeletal: Positive for arthralgias.       Right hand pain  Neurological: Positive for headaches. Negative for dizziness.  Psychiatric/Behavioral: Negative.     Patient Active Problem List   Diagnosis  Date Noted  . Essential hypertension, benign 11/08/2015  . Tendon calcification 11/08/2015  . Adjustment disorder with mixed anxiety and depressed mood 11/08/2015  . Insomnia 11/08/2015    Allergies  Allergen Reactions  . Morphine And Related Rash    At injection site  . Tramadol Nausea And Vomiting    Prior to Admission medications   Not on File    Past Medical, Surgical Family and Social History reviewed and updated.    Objective:   Today's Vitals   09/03/17 0812  BP: (!) 144/84  Pulse: 60  Resp: 16  Temp: 98.7 F (37.1 C)  TempSrc: Oral  SpO2: 100%   Weight: 211 lb (95.7 kg)  Height: 5\' 5"  (1.651 m)    Wt Readings from Last 3 Encounters:  09/03/17 211 lb (95.7 kg)  06/01/17 209 lb (94.8 kg)  03/29/17 203 lb (92.1 kg)   Physical Exam General Appearance:    Alert, cooperative, no distress, appears stated age  Head:    Normocephalic, without obvious abnormality, atraumatic  Eyes:    PERRL, conjunctiva/corneas clear, EOM's intact  Back:     Symmetric, no curvature, ROM normal, no CVA tenderness  Lungs:     Clear to auscultation bilaterally, respirations unlabored  Chest Wall:    No tenderness or deformity   Heart:    Regular rate and rhythm, S1 and S2 normal, no murmur, rub   or gallop  Abdomen:     Soft, non-tender, bowel sounds active all four quadrants,    no masses, no organomegaly  Extremities:   Extremities normal, atraumatic, no cyanosis or edema  Neurologic:   Normal strength, gait, sensation    Assessment & Plan:  1. Prediabetes, A1C today 5.7. Encouraged reduction and or elimination of sweetened beverages. Make efforts to incorporate physical activity with a goal of 150 minutes per week. Handout provided regarding dietary recommendations to improve glycemic control.  2. Screening for thyroid disorder- Thyroid Panel With TSH  3. Breast screening- MM Digital Screening; Future  4. Essential hypertension, uncontrolled, untreated. Restart Losartan 25 mg once daily  We have discussed target BP range and blood pressure goal. I have advised patient to check BP regularly and to call us back or report to clinic if the numbers are consistently higher than 140/90. We discussed the importance of compliance with medical therapy and DASH diet recommended, consequences of uncontrolled hypertension discussed.  - Comprehensive metabolic panel  5. Screening for deficiency anemia- CBC with Differential  6. Encounter for vitamin deficiency screening- VITAMIN D 25 Hydroxy (Vit-D Deficiency, Fractures)  7. Hypertension, Benign Essential,  elevated today. Resume losartan 25 mg once daily.  We have discussed target BP range and blood pressure goal. I have advised patient to check BP regularly and to call us back or report to clinic if the numbers are consistently higher than 140/90. We discussed the importance of compliance with medical therapy and DASH diet recommended, consequences of uncontrolled hypertension discussed.   8. Headaches, likely secondary to uncontrolled hypertension. Will treat conservatively for now with Naproxen 500 mg twice daily as needed with food and cyclobenzaprine 10 mg up to 3 times daily as needed. Cautioned that cyclobenzaprine induces sedation. If no improvement of headaches, will consider referral to the headache clinic.   Meds ordered this encounter  Medications  . losartan (COZAAR) 25 MG tablet    Sig: Take 1 tablet (25 mg total) by mouth daily.    Dispense:  90 tablet    Refill:  1    Order Specific Question:   Supervising Provider    Answer:   Tresa Garter [id number]  . cyclobenzaprine (FLEXERIL) 10 MG tablet    Sig: Take 1 tablet (10 mg total) by mouth 3 (three) times daily as needed for muscle spasms.    Dispense:  30 tablet    Refill:  0    Order Specific Question:   Supervising Provider    Answer:   Tresa Garter [id number]  . naproxen (NAPROSYN) 500 MG tablet    Sig: Take 1 tablet (500 mg total) by mouth 2 (two) times daily with a meal.    Dispense:  30 tablet    Refill:  0    Order Specific Question:   Supervising Provider    Answer:   Tresa Garter [[id number]]   Orders Placed This Encounter  Procedures  . MM Digital Screening    Standing Status:   Future    Standing Expiration Date:   11/02/2018    Order Specific Question:   Reason for Exam (SYMPTOM  OR DIAGNOSIS REQUIRED)    Answer:   routine breast cancer screen    Order Specific Question:   Is the patient pregnant?    Answer:   No    Order Specific Question:   Preferred imaging location?    Answer:    [facility]  . CBC with Differential  . Comprehensive metabolic panel  . Thyroid Panel With TSH  . VITAMIN D 25 Hydroxy (Vit-D Deficiency, Fractures)  . POCT glycosylated hemoglobin (Hb A1C)  . POCT urinalysis dip (device)      RTC: 3 weeks for blood pressure recheck and 3 months for chronic condition management.  Carroll Sage. Kenton Kingfisher, MSN, FNP-C [facility]  [address] [place], [place] [address] [phone]

## 2017-09-03 NOTE — Patient Instructions (Addendum)
I am referring you to [facility] Dr. Fredna Dow for your hands.  I recommend reduction of sweetened beverages to reduce the risk of development of diabetes.   Hypertension Hypertension is another name for high blood pressure. High blood pressure forces your heart to work harder to pump blood. This can cause problems over time. There are two numbers in a blood pressure reading. There is a top number (systolic) over a bottom number (diastolic). It is best to have a blood pressure below 120/80. Healthy choices can help lower your blood pressure. You may need medicine to help lower your blood pressure if:  Your blood pressure cannot be lowered with healthy choices.  Your blood pressure is higher than 130/80.  Follow these instructions at home: Eating and drinking  If directed, follow the DASH eating plan. This diet includes: ? Filling half of your plate at each meal with fruits and vegetables. ? Filling one quarter of your plate at each meal with whole grains. Whole grains include whole wheat pasta, brown rice, and whole grain bread. ? Eating or drinking low-fat dairy products, such as skim milk or low-fat yogurt. ? Filling one quarter of your plate at each meal with low-fat (lean) proteins. Low-fat proteins include fish, skinless chicken, eggs, beans, and tofu. ? Avoiding fatty meat, cured and processed meat, or chicken with skin. ? Avoiding premade or processed food.  Eat less than 1,500 mg of salt (sodium) a day.  Limit alcohol use to no more than 1 drink a day for nonpregnant women and 2 drinks a day for men. One drink equals 12 oz of beer, 5 oz of wine, or 1 oz of hard liquor. Lifestyle  Work with your doctor to stay at a healthy weight or to lose weight. Ask your doctor what the best weight is for you.  Get at least 30 minutes of exercise that causes your heart to beat faster (aerobic exercise) most days of the week. This may include walking, swimming, or biking.  Get at least 30  minutes of exercise that strengthens your muscles (resistance exercise) at least 3 days a week. This may include lifting weights or pilates.  Do not use any products that contain nicotine or tobacco. This includes cigarettes and e-cigarettes. If you need help quitting, ask your doctor.  Check your blood pressure at home as told by your doctor.  Keep all follow-up visits as told by your doctor. This is important. Medicines  Take over-the-counter and prescription medicines only as told by your doctor. Follow directions carefully.  Do not skip doses of blood pressure medicine. The medicine does not work as well if you skip doses. Skipping doses also puts you at risk for problems.  Ask your doctor about side effects or reactions to medicines that you should watch for. Contact a doctor if:  You think you are having a reaction to the medicine you are taking.  You have headaches that keep coming back (recurring).  You feel dizzy.  You have swelling in your ankles.  You have trouble with your vision. Get help right away if:  You get a very bad headache.  You start to feel confused.  You feel weak or numb.  You feel faint.  You get very bad pain in your: ? Chest. ? Belly (abdomen).  You throw up (vomit) more than once.  You have trouble breathing. Summary  Hypertension is another name for high blood pressure.  Making healthy choices can help lower blood pressure. If your  blood pressure cannot be controlled with healthy choices, you may need to take medicine. This information is not intended to replace advice given to you by your health care provider. Make sure you discuss any questions you have with your health care provider. Document Released: 01/27/2008 Document Revised: 07/08/2016 Document Reviewed: 07/08/2016 Elsevier Interactive Patient Education  2018 [employer].    Prediabetes Eating Plan Prediabetes-also called impaired glucose tolerance or impaired fasting  glucose-is a condition that causes blood sugar (blood glucose) levels to be higher than normal. Following a healthy diet can help to keep prediabetes under control. It can also help to lower the risk of type 2 diabetes and heart disease, which are increased in people who have prediabetes. Along with regular exercise, a healthy diet:  Promotes weight loss.  Helps to control blood sugar levels.  Helps to improve the way that the body uses insulin.  What do I need to know about this eating plan?  Use the glycemic index (GI) to plan your meals. The index tells you how quickly a food will raise your blood sugar. Choose low-GI foods. These foods take a longer time to raise blood sugar.  Pay close attention to the amount of carbohydrates in the food that you eat. Carbohydrates increase blood sugar levels.  Keep track of how many calories you take in. Eating the right amount of calories will help you to achieve a healthy weight. Losing about 7 percent of your starting weight can help to prevent type 2 diabetes.  You may want to follow a Mediterranean diet. This diet includes a lot of vegetables, lean meats or fish, whole grains, fruits, and healthy oils and fats. What foods can I eat? Grains Whole grains, such as whole-wheat or whole-grain breads, crackers, cereals, and pasta. Unsweetened oatmeal. Bulgur. Barley. Quinoa. Brown rice. Corn or whole-wheat flour tortillas or taco shells. Vegetables Lettuce. Spinach. Peas. Beets. Cauliflower. Cabbage. Broccoli. Carrots. Tomatoes. Squash. Eggplant. Herbs. Peppers. Onions. Cucumbers. Brussels sprouts. Fruits Berries. Bananas. Apples. Oranges. Grapes. Papaya. Mango. Pomegranate. Kiwi. Grapefruit. Cherries. Meats and Other Protein Sources Seafood. Lean meats, such as chicken and [place] or lean cuts of pork and beef. Tofu. Eggs. Nuts. Beans. Dairy Low-fat or fat-free dairy products, such as yogurt, cottage cheese, and cheese. Beverages Water. Tea. Coffee.  Sugar-free or diet soda. Seltzer water. Milk. Milk alternatives, such as soy or almond milk. Condiments Mustard. Relish. Low-fat, low-sugar ketchup. Low-fat, low-sugar barbecue sauce. Low-fat or fat-free mayonnaise. Sweets and Desserts Sugar-free or low-fat pudding. Sugar-free or low-fat ice cream and other frozen treats. Fats and Oils Avocado. Walnuts. Olive oil. The items listed above may not be a complete list of recommended foods or beverages. Contact your dietitian for more options. What foods are not recommended? Grains Refined white flour and flour products, such as bread, pasta, snack foods, and cereals. Beverages Sweetened drinks, such as sweet iced tea and soda. Sweets and Desserts Baked goods, such as cake, cupcakes, pastries, cookies, and cheesecake. The items listed above may not be a complete list of foods and beverages to avoid. Contact your dietitian for more information. This information is not intended to replace advice given to you by your health care provider. Make sure you discuss any questions you have with your health care provider. Document Released: 12/25/2014 Document Revised: 01/16/2016 Document Reviewed: 09/05/2014 Elsevier Interactive Patient Education  2017 [employer].

## 2017-09-04 LAB — THYROID PANEL WITH TSH
Free Thyroxine Index: 1.6 (ref 1.2–4.9)
T3 Uptake Ratio: 26 % (ref 24–39)
T4, Total: 6.2 ug/dL (ref 4.5–12.0)
TSH: 0.953 u[IU]/mL (ref 0.450–4.500)

## 2017-09-04 LAB — CBC WITH DIFFERENTIAL/PLATELET
Basophils Absolute: 0 10*3/uL (ref 0.0–0.2)
Basos: 1 %
EOS (ABSOLUTE): 0.2 10*3/uL (ref 0.0–0.4)
Eos: 4 %
Hematocrit: 39.2 % (ref 34.0–46.6)
Hemoglobin: 12.2 g/dL (ref 11.1–15.9)
Immature Grans (Abs): 0 10*3/uL (ref 0.0–0.1)
Immature Granulocytes: 0 %
Lymphocytes Absolute: 1.6 10*3/uL (ref 0.7–3.1)
Lymphs: 39 %
MCH: 22.6 pg — ABNORMAL LOW (ref 26.6–33.0)
MCHC: 31.1 g/dL — ABNORMAL LOW (ref 31.5–35.7)
MCV: 73 fL — ABNORMAL LOW (ref 79–97)
Monocytes Absolute: 0.6 10*3/uL (ref 0.1–0.9)
Monocytes: 14 %
Neutrophils Absolute: 1.7 10*3/uL (ref 1.4–7.0)
Neutrophils: 42 %
Platelets: 406 10*3/uL — ABNORMAL HIGH (ref 150–379)
RBC: 5.4 x10E6/uL — ABNORMAL HIGH (ref 3.77–5.28)
RDW: 16.6 % — ABNORMAL HIGH (ref 12.3–15.4)
WBC: 4.1 10*3/uL (ref 3.4–10.8)

## 2017-09-04 LAB — COMPREHENSIVE METABOLIC PANEL
ALT: 7 IU/L (ref 0–32)
AST: 16 IU/L (ref 0–40)
Albumin/Globulin Ratio: 1.2 (ref 1.2–2.2)
Albumin: 4.4 g/dL (ref 3.5–5.5)
Alkaline Phosphatase: 170 IU/L — ABNORMAL HIGH (ref 39–117)
BUN/Creatinine Ratio: 20 (ref 9–23)
BUN: 12 mg/dL (ref 6–24)
Bilirubin Total: 0.5 mg/dL (ref 0.0–1.2)
CO2: 22 mmol/L (ref 20–29)
Calcium: 9.5 mg/dL (ref 8.7–10.2)
Chloride: 107 mmol/L — ABNORMAL HIGH (ref 96–106)
Creatinine, Ser: 0.6 mg/dL (ref 0.57–1.00)
GFR calc Af Amer: 121 mL/min/{1.73_m2} (ref >59)
GFR calc non Af Amer: 105 mL/min/{1.73_m2} (ref >59)
Globulin, Total: 3.7 g/dL (ref 1.5–4.5)
Glucose: 96 mg/dL (ref 65–99)
Potassium: 4.5 mmol/L (ref 3.5–5.2)
Sodium: 143 mmol/L (ref 134–144)
Total Protein: 8.1 g/dL (ref 6.0–8.5)

## 2017-09-04 LAB — VITAMIN D 25 HYDROXY (VIT D DEFICIENCY, FRACTURES): Vit D, 25-Hydroxy: 13.7 ng/mL — ABNORMAL LOW (ref 30.0–100.0)

## 2017-09-06 ENCOUNTER — Telehealth: Payer: Self-pay | Admitting: Family Medicine

## 2017-09-06 DIAGNOSIS — Z130 Encounter for screening for diseases of the blood and blood-forming organs and certain disorders involving the immune mechanism: Secondary | ICD-10-CM

## 2017-09-06 NOTE — Telephone Encounter (Signed)
Patient notified and lab added on to blood work

## 2017-09-06 NOTE — Addendum Note (Signed)
Addended by: Jefferson Fuel on: 09/06/2017 01:29 PM   Modules accepted: Orders

## 2017-09-06 NOTE — Telephone Encounter (Signed)
Please contact [facility] to add a iron, TIBC,  and ferritin panel to patient's labs.   Notify patient that her labs are clinically stable however indicate anemia and vitamin D deficiency. I am checking an iron level to determine if her anemia is caused by low iron. I am e-prescribing ergocalciferol 50,000 units once weekly to correct vitamin D deficiency. I will recheck a vitamin D level in 3 months.  Jackie Reed. Jackie Kingfisher, MSN, FNP-C [facility]  [address] [place], [place] [address] [phone]

## 2017-09-06 NOTE — Telephone Encounter (Signed)
Left a vm for patient to callback 

## 2017-09-07 LAB — IRON,TIBC AND FERRITIN PANEL
Ferritin: 31 ng/mL (ref 15–150)
Iron Saturation: 22 % (ref 15–55)
Iron: 77 ug/dL (ref 27–159)
Total Iron Binding Capacity: 355 ug/dL (ref 250–450)
UIBC: 278 ug/dL (ref 131–425)

## 2017-09-07 LAB — SPECIMEN STATUS REPORT

## 2017-09-14 ENCOUNTER — Telehealth: Payer: Self-pay

## 2017-09-14 ENCOUNTER — Ambulatory Visit
Admission: RE | Admit: 2017-09-14 | Discharge: 2017-09-14 | Disposition: A | Discharge status: Home / Self Care | Payer: BLUE CROSS/BLUE SHIELD | Source: Ambulatory Visit | Attending: Family Medicine | Admitting: Family Medicine

## 2017-09-14 DIAGNOSIS — Z1239 Encounter for other screening for malignant neoplasm of breast: Secondary | ICD-10-CM

## 2017-09-14 MED ORDER — VITAMIN D (ERGOCALCIFEROL) 1.25 MG (50000 UNIT) PO CAPS: 50000.0000 [IU] | ORAL_CAPSULE | ORAL | 0 refills | Status: DC

## 2017-09-14 NOTE — Telephone Encounter (Signed)
Vitamin D has been sent. Please notify patient

## 2017-09-14 NOTE — Telephone Encounter (Signed)
Script wasn't sent in for Vitamin D

## 2017-09-15 NOTE — Telephone Encounter (Signed)
Patient notified and will pick up medication  

## 2017-09-17 ENCOUNTER — Other Ambulatory Visit: Payer: Self-pay | Admitting: Orthopedic Surgery

## 2017-09-20 ENCOUNTER — Ambulatory Visit: Payer: BLUE CROSS/BLUE SHIELD | Admitting: Family Medicine

## 2017-10-02 ENCOUNTER — Other Ambulatory Visit: Payer: Self-pay

## 2017-10-02 ENCOUNTER — Emergency Department (HOSPITAL_COMMUNITY)
Admission: EM | Admit: 2017-10-02 | Discharge: 2017-10-03 | Discharge status: Against Medical Advice | Payer: BLUE CROSS/BLUE SHIELD | Attending: Emergency Medicine | Admitting: Emergency Medicine

## 2017-10-02 ENCOUNTER — Encounter (HOSPITAL_COMMUNITY): Payer: Self-pay | Admitting: *Deleted

## 2017-10-02 DIAGNOSIS — R109 Unspecified abdominal pain: Secondary | ICD-10-CM | POA: Insufficient documentation

## 2017-10-02 DIAGNOSIS — Z5321 Procedure and treatment not carried out due to patient leaving prior to being seen by health care provider: Secondary | ICD-10-CM | POA: Diagnosis not present

## 2017-10-02 DIAGNOSIS — R112 Nausea with vomiting, unspecified: Secondary | ICD-10-CM | POA: Diagnosis not present

## 2017-10-02 DIAGNOSIS — M549 Dorsalgia, unspecified: Secondary | ICD-10-CM | POA: Insufficient documentation

## 2017-10-02 LAB — COMPREHENSIVE METABOLIC PANEL
ALT: 16 U/L (ref 14–54)
AST: 33 U/L (ref 15–41)
Albumin: 4.4 g/dL (ref 3.5–5.0)
Alkaline Phosphatase: 171 U/L — ABNORMAL HIGH (ref 38–126)
Anion gap: 10 (ref 5–15)
BUN: 12 mg/dL (ref 6–20)
CO2: 21 mmol/L — ABNORMAL LOW (ref 22–32)
Calcium: 9.5 mg/dL (ref 8.9–10.3)
Chloride: 104 mmol/L (ref 101–111)
Creatinine, Ser: 0.76 mg/dL (ref 0.44–1.00)
GFR calc Af Amer: >60 mL/min (ref >60)
GFR calc non Af Amer: >60 mL/min (ref >60)
Glucose, Bld: 104 mg/dL — ABNORMAL HIGH (ref 65–99)
Potassium: 3.7 mmol/L (ref 3.5–5.1)
Sodium: 135 mmol/L (ref 135–145)
Total Bilirubin: 0.9 mg/dL (ref 0.3–1.2)
Total Protein: 9.1 g/dL — ABNORMAL HIGH (ref 6.5–8.1)

## 2017-10-02 LAB — CBC
HCT: 39 % (ref 36.0–46.0)
Hemoglobin: 12.9 g/dL (ref 12.0–15.0)
MCH: 23 pg — ABNORMAL LOW (ref 26.0–34.0)
MCHC: 33.1 g/dL (ref 30.0–36.0)
MCV: 69.6 fL — ABNORMAL LOW (ref 78.0–100.0)
Platelets: 374 10*3/uL (ref 150–400)
RBC: 5.6 MIL/uL — ABNORMAL HIGH (ref 3.87–5.11)
RDW: 15.3 % (ref 11.5–15.5)
WBC: 12.3 10*3/uL — ABNORMAL HIGH (ref 4.0–10.5)

## 2017-10-02 LAB — LIPASE, BLOOD: Lipase: 22 U/L (ref 11–51)

## 2017-10-02 LAB — I-STAT BETA HCG BLOOD, ED (MC, WL, AP ONLY): I-stat hCG, quantitative: 5.2 m[IU]/mL — ABNORMAL HIGH (ref <5)

## 2017-10-02 NOTE — ED Notes (Signed)
Pt does not want to wait any longer, pt had IV from EMS that I removed. No bleeding noted

## 2017-10-02 NOTE — ED Triage Notes (Signed)
Pt developed abd pain, N/V and back pain around 0630 reported by EMS. Pt has 20 Rt AC 4mg  zofran given, 10/10 abd pain. 148/98-83-100% RA CBG 93

## 2017-11-29 ENCOUNTER — Encounter: Payer: Self-pay | Admitting: Family Medicine

## 2017-11-29 ENCOUNTER — Telehealth: Payer: Self-pay | Admitting: Family Medicine

## 2017-11-29 ENCOUNTER — Ambulatory Visit (INDEPENDENT_AMBULATORY_CARE_PROVIDER_SITE_OTHER): Payer: Self-pay | Admitting: Family Medicine

## 2017-11-29 ENCOUNTER — Other Ambulatory Visit: Payer: Self-pay | Admitting: Family Medicine

## 2017-11-29 VITALS — BP 140/72 | HR 66 | Temp 97.9°F | Ht 65.0 in | Wt 211.0 lb

## 2017-11-29 DIAGNOSIS — R82998 Other abnormal findings in urine: Secondary | ICD-10-CM

## 2017-11-29 DIAGNOSIS — R7303 Prediabetes: Secondary | ICD-10-CM

## 2017-11-29 DIAGNOSIS — E559 Vitamin D deficiency, unspecified: Secondary | ICD-10-CM

## 2017-11-29 DIAGNOSIS — I10 Essential (primary) hypertension: Secondary | ICD-10-CM

## 2017-11-29 LAB — POCT URINALYSIS DIP (MANUAL ENTRY)
Bilirubin, UA: NEGATIVE
Blood, UA: NEGATIVE
Glucose, UA: NEGATIVE mg/dL
Nitrite, UA: POSITIVE — AB
Protein Ur, POC: NEGATIVE mg/dL
Spec Grav, UA: >=1.03 — AB (ref 1.010–1.025)
Urobilinogen, UA: 0.2 E.U./dL
pH, UA: 5 (ref 5.0–8.0)

## 2017-11-29 LAB — POCT GLYCOSYLATED HEMOGLOBIN (HGB A1C): Hemoglobin A1C: 5.6

## 2017-11-29 MED ORDER — CEPHALEXIN 500 MG PO CAPS: 500.0000 mg | ORAL_CAPSULE | Freq: Two times a day (BID) | ORAL | 0 refills | Status: AC

## 2017-11-29 MED ORDER — VITAMIN D (ERGOCALCIFEROL) 1.25 MG (50000 UNIT) PO CAPS: 50000.0000 [IU] | ORAL_CAPSULE | ORAL | 0 refills | Status: DC

## 2017-11-29 NOTE — Telephone Encounter (Signed)
Left a vm for patient to callback 

## 2017-11-29 NOTE — Progress Notes (Signed)
Patient ID: Jackie Reed, female    DOB: May 01, 1966, 52 y.o.   MRN: [id number]  PCP: Scot Jun, FNP  Chief Complaint  Patient presents with  . Follow-up    3 month on HTN    Subjective:  HPI [name] is a 52 y.o. female with hypertension, obesity, Dupuytren's Fibromatosis, presents for evaluation of hypertension.  Jackie Reed reports overall she has been doing well since her last follow-up.  She remains inactive for physical activity.  She is concerned regarding weight gain although endorses no real attempts or efforts to lose weight.  She reports since starting blood pressure medication she has been free of headaches and/or dizziness.  She denies chest pain or shortness of breath.  She is concerned as during her last visit she was found to be prediabetic with an A1c of 5.7 and request a repeat A1c checked today.  Labs from her last visit also revealed that she had a significant vitamin D deficiency although she reports today she has not picked up vitamin D replacement tablets today. Denies any other complaints today. Social History   Socioeconomic History  . Marital status: Widowed    Spouse name: Not on file  . Number of children: Not on file  . Years of education: Not on file  . Highest education level: Not on file  Occupational History  . Not on file  Social Needs  . Financial resource strain: Not on file  . Food insecurity:    Worry: Not on file    Inability: Not on file  . Transportation needs:    Medical: Not on file    Non-medical: Not on file  Tobacco Use  . Smoking status: Never Smoker  . Smokeless tobacco: Never Used  Substance and Sexual Activity  . Alcohol use: No    Alcohol/week: 0.0 oz  . Drug use: No  . Sexual activity: Yes    Birth control/protection: None  Lifestyle  . Physical activity:    Days per week: Not on file    Minutes per session: Not on file  . Stress: Not on file  Relationships  . Social connections:    Talks on phone: Not on file     Gets together: Not on file    Attends religious service: Not on file    Active member of club or organization: Not on file    Attends meetings of clubs or organizations: Not on file    Relationship status: Not on file  . Intimate partner violence:    Fear of current or ex partner: Not on file    Emotionally abused: Not on file    Physically abused: Not on file    Forced sexual activity: Not on file  Other Topics Concern  . Not on file  Social History Narrative  . Not on file    Family History  Problem Relation Age of Onset  . Heart failure Mother   . Diabetes Mother   . Heart failure Father   . Hypertension Father   . Hyperlipidemia Father   . Colon cancer Neg Hx   . Colon polyps Neg Hx    Review of Systems Pertinent negatives listed in HPI  Patient Active Problem List   Diagnosis Date Noted  . Essential hypertension, benign 11/08/2015  . Tendon calcification 11/08/2015  . Adjustment disorder with mixed anxiety and depressed mood 11/08/2015  . Insomnia 11/08/2015    Allergies  Allergen Reactions  . Morphine And Related Rash  At injection site  . Tramadol Nausea And Vomiting    Prior to Admission medications   Medication Sig Start Date End Date Taking? Authorizing Provider  losartan (COZAAR) 25 MG tablet Take 1 tablet (25 mg total) by mouth daily. 09/03/17  Yes Scot Jun, FNP  cyclobenzaprine (FLEXERIL) 10 MG tablet Take 1 tablet (10 mg total) by mouth 3 (three) times daily as needed for muscle spasms. Patient not taking: Reported on 11/29/2017 09/03/17   Scot Jun, FNP  Vitamin D, Ergocalciferol, (DRISDOL) 50000 units CAPS capsule Take 1 capsule (50,000 Units total) by mouth every 7 (seven) days. Patient not taking: Reported on 11/29/2017 09/14/17   Scot Jun, FNP    Past Medical, Surgical Family and Social History reviewed and updated.    Objective:   Today's Vitals   11/29/17 1022  BP: 140/72  Pulse: 66  Temp: 97.9 F (36.6 C)   TempSrc: Oral  SpO2: 100%  Weight: 211 lb (95.7 kg)  Height: 5\' 5"  (1.651 m)    Wt Readings from Last 3 Encounters:  11/29/17 211 lb (95.7 kg)  10/02/17 211 lb (95.7 kg)  09/03/17 211 lb (95.7 kg)    Physical Exam Constitutional: Patient appears well-developed and well-nourished. No distress. HENT: Normocephalic, atraumatic. Eyes: Conjunctivae and EOM are normal. PERRLA, no scleral icterus. Neck: Normal ROM. Neck supple. No JVD. No tracheal deviation. No thyromegaly. CVS: RRR, S1/S2 +, no murmurs, no gallops, no carotid bruit.  Pulmonary: Effort and breath sounds normal, no stridor, rhonchi, wheezes, rales.  Musculoskeletal: Normal range of motion. No edema and no tenderness.  Neuro: Alert. Normal reflexes, muscle tone coordination. No cranial nerve deficit. Skin: Skin is warm and dry. No rash noted. Not diaphoretic. No erythema. No pallor. Psychiatric: Normal mood and affect. Behavior, judgment, thought content normal.   Assessment & Plan:  1. Essential hypertension, patient did not take medication prior to visit today. Although BP is at goal. We have discussed target BP range and blood pressure goal. I have advised patient to check BP regularly and to call us back or report to clinic if the numbers are consistently higher than 140/90. We discussed the importance of compliance with medical therapy and DASH diet recommended, consequences of uncontrolled hypertension discussed.  - continue current BP medications  2. Prediabetes,  (Hb A1C)  3. Vitamin D deficiency, start ergocalciferol 50,000 once daily x 12 weeks.     Meds ordered this encounter  Medications  . Vitamin D, Ergocalciferol, (DRISDOL) 50000 units CAPS capsule    Sig: Take 1 capsule (50,000 Units total) by mouth every 7 (seven) days.    Dispense:  30 capsule    Refill:  0    Order Specific Question:   Supervising Provider    Answer:   Tresa Garter [[id number]]    Orders Placed This Encounter  Procedures   . POCT urinalysis dipstick  . POCT glycosylated hemoglobin (Hb A1C)    RTC: 6 months chronic conditions follow-up.  Carroll Sage. Kenton Kingfisher, MSN, FNP-C [facility]  [address] [place], [place] [address] [phone]

## 2017-11-29 NOTE — Patient Instructions (Addendum)
I have re-ordered your vitamin D replacement tables. Take 1 tablet weekly for a total of 12 weeks.     Prediabetes Eating Plan Prediabetes-also called impaired glucose tolerance or impaired fasting glucose-is a condition that causes blood sugar (blood glucose) levels to be higher than normal. Following a healthy diet can help to keep prediabetes under control. It can also help to lower the risk of type 2 diabetes and heart disease, which are increased in people who have prediabetes. Along with regular exercise, a healthy diet:  Promotes weight loss.  Helps to control blood sugar levels.  Helps to improve the way that the body uses insulin.  What do I need to know about this eating plan?  Use the glycemic index (GI) to plan your meals. The index tells you how quickly a food will raise your blood sugar. Choose low-GI foods. These foods take a longer time to raise blood sugar.  Pay close attention to the amount of carbohydrates in the food that you eat. Carbohydrates increase blood sugar levels.  Keep track of how many calories you take in. Eating the right amount of calories will help you to achieve a healthy weight. Losing about 7 percent of your starting weight can help to prevent type 2 diabetes.  You may want to follow a Mediterranean diet. This diet includes a lot of vegetables, lean meats or fish, whole grains, fruits, and healthy oils and fats. What foods can I eat? Grains Whole grains, such as whole-wheat or whole-grain breads, crackers, cereals, and pasta. Unsweetened oatmeal. Bulgur. Barley. Quinoa. Brown rice. Corn or whole-wheat flour tortillas or taco shells. Vegetables Lettuce. Spinach. Peas. Beets. Cauliflower. Cabbage. Broccoli. Carrots. Tomatoes. Squash. Eggplant. Herbs. Peppers. Onions. Cucumbers. Brussels sprouts. Fruits Berries. Bananas. Apples. Oranges. Grapes. Papaya. Mango. Pomegranate. Kiwi. Grapefruit. Cherries. Meats and Other Protein Sources Seafood. Lean  meats, such as chicken and [place] or lean cuts of pork and beef. Tofu. Eggs. Nuts. Beans. Dairy Low-fat or fat-free dairy products, such as yogurt, cottage cheese, and cheese. Beverages Water. Tea. Coffee. Sugar-free or diet soda. Seltzer water. Milk. Milk alternatives, such as soy or almond milk. Condiments Mustard. Relish. Low-fat, low-sugar ketchup. Low-fat, low-sugar barbecue sauce. Low-fat or fat-free mayonnaise. Sweets and Desserts Sugar-free or low-fat pudding. Sugar-free or low-fat ice cream and other frozen treats. Fats and Oils Avocado. Walnuts. Olive oil. The items listed above may not be a complete list of recommended foods or beverages. Contact your dietitian for more options. What foods are not recommended? Grains Refined white flour and flour products, such as bread, pasta, snack foods, and cereals. Beverages Sweetened drinks, such as sweet iced tea and soda. Sweets and Desserts Baked goods, such as cake, cupcakes, pastries, cookies, and cheesecake. The items listed above may not be a complete list of foods and beverages to avoid. Contact your dietitian for more information. This information is not intended to replace advice given to you by your health care provider. Make sure you discuss any questions you have with your health care provider. Document Released: 12/25/2014 Document Revised: 01/16/2016 Document Reviewed: 09/05/2014 Elsevier Interactive Patient Education  2017 [employer].

## 2017-11-29 NOTE — Addendum Note (Signed)
Addended by: Scot Jun on: 11/29/2017 01:47 PM   Modules accepted: Orders

## 2017-11-29 NOTE — Progress Notes (Signed)
poct

## 2017-11-29 NOTE — Progress Notes (Signed)
error 

## 2017-11-29 NOTE — Telephone Encounter (Signed)
Patient urine is positive for nitrates and trace leukocytes.  Will treat empirically for UTI with Keflex 500 mg twice daily x 7 days. If she experiences fever or chills, return for care sooner.   Jackie Reed. Kenton Kingfisher, MSN, FNP-C [facility]  [address] [place], [place] [address] [phone]

## 2017-11-30 NOTE — Telephone Encounter (Signed)
Left a vm ofr patient to callback

## 2017-12-02 LAB — URINE CULTURE

## 2017-12-03 ENCOUNTER — Ambulatory Visit: Payer: Self-pay | Admitting: Family Medicine

## 2018-01-27 ENCOUNTER — Encounter (HOSPITAL_BASED_OUTPATIENT_CLINIC_OR_DEPARTMENT_OTHER): Payer: Self-pay

## 2018-01-27 ENCOUNTER — Ambulatory Visit (HOSPITAL_BASED_OUTPATIENT_CLINIC_OR_DEPARTMENT_OTHER): Admit: 2018-01-27 | Payer: BLUE CROSS/BLUE SHIELD | Admitting: Orthopedic Surgery

## 2018-01-27 SURGERY — EXCISION MASS
Anesthesia: Choice | Laterality: Right

## 2018-03-25 ENCOUNTER — Ambulatory Visit (INDEPENDENT_AMBULATORY_CARE_PROVIDER_SITE_OTHER): Payer: BLUE CROSS/BLUE SHIELD | Admitting: Family Medicine

## 2018-03-25 ENCOUNTER — Encounter: Payer: Self-pay | Admitting: Family Medicine

## 2018-03-25 VITALS — BP 142/88 | HR 74 | Temp 97.7°F | Ht 65.0 in | Wt 215.0 lb

## 2018-03-25 DIAGNOSIS — I10 Essential (primary) hypertension: Secondary | ICD-10-CM

## 2018-03-25 DIAGNOSIS — R8290 Unspecified abnormal findings in urine: Secondary | ICD-10-CM | POA: Diagnosis not present

## 2018-03-25 DIAGNOSIS — E559 Vitamin D deficiency, unspecified: Secondary | ICD-10-CM | POA: Diagnosis not present

## 2018-03-25 DIAGNOSIS — Z09 Encounter for follow-up examination after completed treatment for conditions other than malignant neoplasm: Secondary | ICD-10-CM

## 2018-03-25 DIAGNOSIS — Z0000 Encounter for general adult medical examination without abnormal findings: Secondary | ICD-10-CM

## 2018-03-25 DIAGNOSIS — N390 Urinary tract infection, site not specified: Secondary | ICD-10-CM

## 2018-03-25 DIAGNOSIS — R319 Hematuria, unspecified: Secondary | ICD-10-CM

## 2018-03-25 LAB — POCT URINALYSIS DIP (MANUAL ENTRY)
Bilirubin, UA: NEGATIVE
Glucose, UA: NEGATIVE mg/dL
Nitrite, UA: NEGATIVE
Spec Grav, UA: >=1.03 — AB (ref 1.010–1.025)
Urobilinogen, UA: 0.2 E.U./dL
pH, UA: 5.5 (ref 5.0–8.0)

## 2018-03-25 MED ORDER — SULFAMETHOXAZOLE-TRIMETHOPRIM 800-160 MG PO TABS: 1.0000 | ORAL_TABLET | Freq: Two times a day (BID) | ORAL | 0 refills | Status: DC

## 2018-03-25 NOTE — Progress Notes (Signed)
Follow Up  Subjective:    Patient ID: Jackie Reed, female    DOB: 1966/03/20, 52 y.o.   MRN: [id number]   Chief Complaint  Patient presents with  . PAPERWORK   HPI  Jackie Reed has a past medical history of Plantar Fibromatosis, Migraine, and Hypertension. She is here today for follow up.  Current Status: Since her last office visit, she is doing well with no complaints. She denies fevers, chills, fatigue, recent infections, weight loss, and night sweats. She has not had any headaches, visual changes, dizziness, and falls. No chest pain, heart palpitations, cough and shortness of breath reported. No reports of GI problems such as nausea, vomiting, diarrhea, and constipation. She has no reports of blood in stools, dysuria and hematuria. No depression or anxiety reported. She denies pain today.   Past Medical History:  Diagnosis Date  . Hypertension   . Migraine   . Plantar fibromatosis     Family History  Problem Relation Age of Onset  . Heart failure Mother   . Diabetes Mother   . Heart failure Father   . Hypertension Father   . Hyperlipidemia Father   . Colon cancer Neg Hx   . Colon polyps Neg Hx     Social History   Socioeconomic History  . Marital status: Widowed    Spouse name: Not on file  . Number of children: Not on file  . Years of education: Not on file  . Highest education level: Not on file  Occupational History  . Not on file  Social Needs  . Financial resource strain: Not on file  . Food insecurity:    Worry: Not on file    Inability: Not on file  . Transportation needs:    Medical: Not on file    Non-medical: Not on file  Tobacco Use  . Smoking status: Never Smoker  . Smokeless tobacco: Never Used  Substance and Sexual Activity  . Alcohol use: No    Alcohol/week: 0.0 oz  . Drug use: No  . Sexual activity: Yes    Birth control/protection: None  Lifestyle  . Physical activity:    Days per week: Not on file    Minutes per session: Not on file   . Stress: Not on file  Relationships  . Social connections:    Talks on phone: Not on file    Gets together: Not on file    Attends religious service: Not on file    Active member of club or organization: Not on file    Attends meetings of clubs or organizations: Not on file    Relationship status: Not on file  . Intimate partner violence:    Fear of current or ex partner: Not on file    Emotionally abused: Not on file    Physically abused: Not on file    Forced sexual activity: Not on file  Other Topics Concern  . Not on file  Social History Narrative  . Not on file    Past Surgical History:  Procedure Laterality Date  . FOOT SURGERY  1985  . PARTIAL HYSTERECTOMY  1997    Immunization History  Administered Date(s) Administered  . PPD Test 05/28/2017    Current Meds  Medication Sig  . losartan (COZAAR) 25 MG tablet Take 1 tablet (25 mg total) by mouth daily.   Allergies  Allergen Reactions  . Morphine And Related Rash    At injection site  . Tramadol Nausea And Vomiting  BP (!) 142/88 (BP Location: Left Arm, Patient Position: Sitting, Cuff Size: Large)   Pulse 74   Temp 97.7 F (36.5 C) (Oral)   Ht 5\' 5"  (1.651 m)   Wt 215 lb (97.5 kg)   SpO2 100%   BMI 35.78 kg/m    Review of Systems  Constitutional: Negative.   HENT: Negative.   Eyes: Negative.   Respiratory: Negative.   Cardiovascular: Negative.   Gastrointestinal: Negative.   Endocrine: Negative.   Genitourinary: Negative.   Musculoskeletal: Negative.   Skin: Negative.   Allergic/Immunologic: Negative.   Neurological: Negative.   Hematological: Negative.   Psychiatric/Behavioral: Negative.    Objective:   Physical Exam  Constitutional: She is oriented to person, place, and time. She appears well-developed and well-nourished.  HENT:  Head: Normocephalic and atraumatic.  Right Ear: External ear normal.  Left Ear: External ear normal.  Nose: Nose normal.  Mouth/Throat: Oropharynx is  clear and moist.  Eyes: Pupils are equal, round, and reactive to light. Conjunctivae and EOM are normal.  Neck: Normal range of motion. Neck supple.  Cardiovascular: Normal rate, regular rhythm, normal heart sounds and intact distal pulses.  Pulmonary/Chest: Effort normal and breath sounds normal.  Abdominal: Soft. Bowel sounds are normal.  Musculoskeletal: Normal range of motion.  Neurological: She is alert and oriented to person, place, and time.  Skin: Skin is warm and dry. Capillary refill takes less than 2 seconds.  Psychiatric: She has a normal mood and affect. Her behavior is normal. Judgment and thought content normal.  Nursing note and vitals reviewed.  Assessment & Plan:   1. Essential hypertension Blood pressure is stable today. - POCT urinalysis dipstick  2. Urinary tract infection with hematuria, site unspecified - sulfamethoxazole-trimethoprim (BACTRIM DS,SEPTRA DS) 800-160 MG tablet; Take 1 tablet by mouth 2 (two) times daily.  Dispense: 14 tablet; Refill: 0  3. Abnormal urinalysis - Urine Culture  4. Vitamin D deficiency Vitamin D level decreased at 13.7 on 09/03/2017. Goal is > 30. She will continue Vitamin D supplement as prescribed.   - Vitamin D, 25-hydroxy  5. Essential hypertension, benign Blood pressure is at 142/88 today. He will continue Cozaar as prescribed.  - CBC with Differential - Comprehensive metabolic panel - Lipid Panel  6. Healthcare maintenance - TSH  7. Follow up He will follow up in 3 months.   Meds ordered this encounter  Medications  . sulfamethoxazole-trimethoprim (BACTRIM DS,SEPTRA DS) 800-160 MG tablet    Sig: Take 1 tablet by mouth 2 (two) times daily.    Dispense:  14 tablet    Refill:  0   Kathe Becton,  MSN, FNP-C [facility] [address] [place], [place] [address] [phone]

## 2018-03-26 LAB — COMPREHENSIVE METABOLIC PANEL
ALT: 13 IU/L (ref 0–32)
AST: 20 IU/L (ref 0–40)
Albumin/Globulin Ratio: 1.3 (ref 1.2–2.2)
Albumin: 4.4 g/dL (ref 3.5–5.5)
Alkaline Phosphatase: 175 IU/L — ABNORMAL HIGH (ref 39–117)
BUN/Creatinine Ratio: 14 (ref 9–23)
BUN: 10 mg/dL (ref 6–24)
Bilirubin Total: 0.4 mg/dL (ref 0.0–1.2)
CO2: 21 mmol/L (ref 20–29)
Calcium: 9.6 mg/dL (ref 8.7–10.2)
Chloride: 104 mmol/L (ref 96–106)
Creatinine, Ser: 0.7 mg/dL (ref 0.57–1.00)
GFR calc Af Amer: 114 mL/min/{1.73_m2} (ref >59)
GFR calc non Af Amer: 99 mL/min/{1.73_m2} (ref >59)
Globulin, Total: 3.5 g/dL (ref 1.5–4.5)
Glucose: 69 mg/dL (ref 65–99)
Potassium: 3.9 mmol/L (ref 3.5–5.2)
Sodium: 142 mmol/L (ref 134–144)
Total Protein: 7.9 g/dL (ref 6.0–8.5)

## 2018-03-26 LAB — CBC WITH DIFFERENTIAL/PLATELET
Basophils Absolute: 0 10*3/uL (ref 0.0–0.2)
Basos: 0 %
EOS (ABSOLUTE): 0.2 10*3/uL (ref 0.0–0.4)
Eos: 5 %
Hematocrit: 39.4 % (ref 34.0–46.6)
Hemoglobin: 12.1 g/dL (ref 11.1–15.9)
Immature Grans (Abs): 0 10*3/uL (ref 0.0–0.1)
Immature Granulocytes: 0 %
Lymphocytes Absolute: 1.7 10*3/uL (ref 0.7–3.1)
Lymphs: 34 %
MCH: 22.2 pg — ABNORMAL LOW (ref 26.6–33.0)
MCHC: 30.7 g/dL — ABNORMAL LOW (ref 31.5–35.7)
MCV: 72 fL — ABNORMAL LOW (ref 79–97)
Monocytes Absolute: 0.5 10*3/uL (ref 0.1–0.9)
Monocytes: 11 %
Neutrophils Absolute: 2.4 10*3/uL (ref 1.4–7.0)
Neutrophils: 50 %
Platelets: 255 10*3/uL (ref 150–450)
RBC: 5.45 x10E6/uL — ABNORMAL HIGH (ref 3.77–5.28)
RDW: 16.5 % — ABNORMAL HIGH (ref 12.3–15.4)
WBC: 4.8 10*3/uL (ref 3.4–10.8)

## 2018-03-26 LAB — LIPID PANEL
Chol/HDL Ratio: 3.4 ratio (ref 0.0–4.4)
Cholesterol, Total: 161 mg/dL (ref 100–199)
HDL: 47 mg/dL (ref >39)
LDL Calculated: 76 mg/dL (ref 0–99)
Triglycerides: 192 mg/dL — ABNORMAL HIGH (ref 0–149)
VLDL Cholesterol Cal: 38 mg/dL (ref 5–40)

## 2018-03-26 LAB — TSH: TSH: 1.07 u[IU]/mL (ref 0.450–4.500)

## 2018-03-26 LAB — VITAMIN D 25 HYDROXY (VIT D DEFICIENCY, FRACTURES): Vit D, 25-Hydroxy: 17.5 ng/mL — ABNORMAL LOW (ref 30.0–100.0)

## 2018-03-27 ENCOUNTER — Other Ambulatory Visit: Payer: Self-pay | Admitting: Family Medicine

## 2018-03-27 DIAGNOSIS — E559 Vitamin D deficiency, unspecified: Secondary | ICD-10-CM

## 2018-03-27 LAB — URINE CULTURE

## 2018-03-27 MED ORDER — VITAMIN D (ERGOCALCIFEROL) 1.25 MG (50000 UNIT) PO CAPS: 50000.0000 [IU] | ORAL_CAPSULE | ORAL | 2 refills | Status: DC

## 2018-03-27 NOTE — Progress Notes (Unsigned)
Rx for Vitamin D to pharmacy today.

## 2018-03-28 ENCOUNTER — Telehealth: Payer: Self-pay

## 2018-03-28 NOTE — Telephone Encounter (Signed)
-----   Message from Azzie Glatter, [place] sent at 03/27/2018 12:52 PM EDT ----- Regarding: "Vitamin D" Morey Hummingbird,   Please inform patient that Vitamin D levels are still decreased.  Rx for Vitamin D to pharmacy today.   Thank you.

## 2018-03-28 NOTE — Telephone Encounter (Signed)
Left a vm for patient to callback 

## 2018-03-29 NOTE — Telephone Encounter (Signed)
Left a vm for patient to callback 

## 2018-03-29 NOTE — Telephone Encounter (Signed)
-----   Message from Azzie Glatter, [place] sent at 03/27/2018 12:52 PM EDT ----- Regarding: "Vitamin D" Jackie Reed,   Please inform patient that Vitamin D levels are still decreased.  Rx for Vitamin D to pharmacy today.   Thank you.

## 2018-03-30 NOTE — Telephone Encounter (Signed)
-----   Message from Azzie Glatter, [place] sent at 03/27/2018 12:52 PM EDT ----- Regarding: "Vitamin D" Jackie Reed,   Please inform patient that Vitamin D levels are still decreased.  Rx for Vitamin D to pharmacy today.   Thank you.

## 2018-03-30 NOTE — Telephone Encounter (Signed)
Left a vm for patient to callback 

## 2018-04-03 ENCOUNTER — Other Ambulatory Visit: Payer: Self-pay | Admitting: Family Medicine

## 2018-04-03 DIAGNOSIS — A498 Other bacterial infections of unspecified site: Secondary | ICD-10-CM

## 2018-04-03 MED ORDER — AMOXICILLIN-POT CLAVULANATE 875-125 MG PO TABS: 1.0000 | ORAL_TABLET | Freq: Two times a day (BID) | ORAL | 0 refills | Status: AC

## 2018-04-03 NOTE — Progress Notes (Unsigned)
Rx for Augmentin to pharmacy today.  

## 2018-04-04 NOTE — Telephone Encounter (Signed)
Left a vm for patient to callback 

## 2018-04-04 NOTE — Telephone Encounter (Signed)
-----   Message from Azzie Glatter, FNP sent at 04/03/2018  9:01 PM EDT ----- Regarding: "Lab Results" Jackie Reed,   Urine culture identified bacteria. Please inform patient that we have sent new Rx for Augmentin to pharmacy today. This antibiotic is specific to eliminate this bacteria. She is to discontinue Septra. She is to take medication as directed. She is to complete all medication until complete.   Thank you.

## 2018-04-05 NOTE — Telephone Encounter (Signed)
-----   Message from Azzie Glatter, [place] sent at 03/27/2018 12:52 PM EDT ----- Regarding: "Vitamin D" Jackie Reed,   Please inform patient that Vitamin D levels are still decreased.  Rx for Vitamin D to pharmacy today.   Thank you.

## 2018-04-05 NOTE — Telephone Encounter (Signed)
I have been unable to reach patient by phone. I mailed letter out to patient to contact office.

## 2018-04-05 NOTE — Telephone Encounter (Signed)
-----   Message from Azzie Glatter, [place] sent at 03/27/2018 12:52 PM EDT ----- Regarding: "Vitamin D" Morey Hummingbird,   Please inform patient that Vitamin D levels are still decreased.  Rx for Vitamin D to pharmacy today.   Thank you.

## 2018-04-07 ENCOUNTER — Encounter

## 2018-04-08 ENCOUNTER — Other Ambulatory Visit: Payer: Self-pay | Admitting: Orthopedic Surgery

## 2018-04-20 ENCOUNTER — Other Ambulatory Visit: Payer: Self-pay

## 2018-05-12 ENCOUNTER — Encounter (HOSPITAL_BASED_OUTPATIENT_CLINIC_OR_DEPARTMENT_OTHER): Payer: Self-pay | Admitting: *Deleted

## 2018-05-12 ENCOUNTER — Other Ambulatory Visit: Payer: Self-pay

## 2018-05-16 ENCOUNTER — Encounter (HOSPITAL_BASED_OUTPATIENT_CLINIC_OR_DEPARTMENT_OTHER)
Admission: RE | Admit: 2018-05-16 | Discharge: 2018-05-16 | Disposition: A | Discharge status: Home / Self Care | Payer: BLUE CROSS/BLUE SHIELD | Source: Ambulatory Visit | Attending: Orthopedic Surgery | Admitting: Orthopedic Surgery

## 2018-05-16 ENCOUNTER — Other Ambulatory Visit: Payer: Self-pay

## 2018-05-16 DIAGNOSIS — Z01810 Encounter for preprocedural cardiovascular examination: Secondary | ICD-10-CM | POA: Diagnosis not present

## 2018-05-19 ENCOUNTER — Ambulatory Visit (HOSPITAL_BASED_OUTPATIENT_CLINIC_OR_DEPARTMENT_OTHER): Payer: BLUE CROSS/BLUE SHIELD | Admitting: Anesthesiology

## 2018-05-19 ENCOUNTER — Other Ambulatory Visit: Payer: Self-pay

## 2018-05-19 ENCOUNTER — Ambulatory Visit (HOSPITAL_BASED_OUTPATIENT_CLINIC_OR_DEPARTMENT_OTHER)
Admission: RE | Admit: 2018-05-19 | Discharge: 2018-05-19 | Disposition: A | Discharge status: Home / Self Care | Payer: BLUE CROSS/BLUE SHIELD | Source: Ambulatory Visit | Attending: Orthopedic Surgery | Admitting: Orthopedic Surgery

## 2018-05-19 ENCOUNTER — Encounter (HOSPITAL_BASED_OUTPATIENT_CLINIC_OR_DEPARTMENT_OTHER): Payer: Self-pay | Admitting: *Deleted

## 2018-05-19 ENCOUNTER — Encounter (HOSPITAL_BASED_OUTPATIENT_CLINIC_OR_DEPARTMENT_OTHER)
Admission: RE | Disposition: A | Discharge status: Home / Self Care | Payer: Self-pay | Source: Ambulatory Visit | Attending: Orthopedic Surgery

## 2018-05-19 DIAGNOSIS — M720 Palmar fascial fibromatosis [Dupuytren]: Secondary | ICD-10-CM | POA: Insufficient documentation

## 2018-05-19 DIAGNOSIS — I10 Essential (primary) hypertension: Secondary | ICD-10-CM | POA: Insufficient documentation

## 2018-05-19 DIAGNOSIS — Z79899 Other long term (current) drug therapy: Secondary | ICD-10-CM | POA: Diagnosis not present

## 2018-05-19 DIAGNOSIS — Z885 Allergy status to narcotic agent status: Secondary | ICD-10-CM | POA: Insufficient documentation

## 2018-05-19 HISTORY — DX: Other specified postprocedural states: Z98.890

## 2018-05-19 HISTORY — PX: MASS EXCISION: SHX2000

## 2018-05-19 HISTORY — DX: Nausea with vomiting, unspecified: R11.2

## 2018-05-19 SURGERY — Surgical Case
Anesthesia: *Unknown

## 2018-05-19 SURGERY — EXCISION MASS
Anesthesia: General | Site: Hand | Laterality: Right

## 2018-05-19 MED ORDER — SCOPOLAMINE 1 MG/3DAYS TD PT72: 1.0000 | MEDICATED_PATCH | Freq: Once | TRANSDERMAL | Status: DC | PRN

## 2018-05-19 MED ORDER — FENTANYL CITRATE (PF) 100 MCG/2ML IJ SOLN
INTRAMUSCULAR | Status: AC
  Filled 2018-05-19: qty 2

## 2018-05-19 MED ORDER — FENTANYL CITRATE (PF) 100 MCG/2ML IJ SOLN
25.0000 ug | INTRAMUSCULAR | Status: DC | PRN
  Administered 2018-05-19: 50 ug via INTRAVENOUS

## 2018-05-19 MED ORDER — DEXAMETHASONE SODIUM PHOSPHATE 10 MG/ML IJ SOLN
INTRAMUSCULAR | Status: DC | PRN
  Administered 2018-05-19: 10 mg via INTRAVENOUS

## 2018-05-19 MED ORDER — ACETAMINOPHEN 500 MG PO TABS
1000.0000 mg | ORAL_TABLET | Freq: Once | ORAL | Status: AC
  Administered 2018-05-19: 1000 mg via ORAL

## 2018-05-19 MED ORDER — ONDANSETRON 4 MG PO TBDP
4.0000 mg | ORAL_TABLET | Freq: Once | ORAL | Status: AC
  Administered 2018-05-19: 4 mg via ORAL

## 2018-05-19 MED ORDER — SCOPOLAMINE 1 MG/3DAYS TD PT72
1.0000 | MEDICATED_PATCH | Freq: Once | TRANSDERMAL | Status: DC
  Administered 2018-05-19: 1.5 mg via TRANSDERMAL

## 2018-05-19 MED ORDER — PROPOFOL 10 MG/ML IV BOLUS
INTRAVENOUS | Status: DC | PRN
  Administered 2018-05-19: 200 mg via INTRAVENOUS
  Administered 2018-05-19: 100 mg via INTRAVENOUS

## 2018-05-19 MED ORDER — HYDROCODONE-ACETAMINOPHEN 5-325 MG PO TABS: ORAL_TABLET | ORAL | 0 refills | Status: DC

## 2018-05-19 MED ORDER — MIDAZOLAM HCL 2 MG/2ML IJ SOLN
INTRAMUSCULAR | Status: AC
  Filled 2018-05-19: qty 2

## 2018-05-19 MED ORDER — ACETAMINOPHEN 500 MG PO TABS
ORAL_TABLET | ORAL | Status: AC
  Filled 2018-05-19: qty 1

## 2018-05-19 MED ORDER — FENTANYL CITRATE (PF) 100 MCG/2ML IJ SOLN
50.0000 ug | INTRAMUSCULAR | Status: DC | PRN
  Administered 2018-05-19 (×2): 50 ug via INTRAVENOUS

## 2018-05-19 MED ORDER — ONDANSETRON 4 MG PO TBDP
ORAL_TABLET | ORAL | Status: AC
  Filled 2018-05-19: qty 1

## 2018-05-19 MED ORDER — CEFAZOLIN SODIUM-DEXTROSE 2-4 GM/100ML-% IV SOLN
INTRAVENOUS | Status: AC
  Filled 2018-05-19: qty 100

## 2018-05-19 MED ORDER — CEFAZOLIN SODIUM-DEXTROSE 2-4 GM/100ML-% IV SOLN
2.0000 g | INTRAVENOUS | Status: AC
  Administered 2018-05-19: 2 g via INTRAVENOUS

## 2018-05-19 MED ORDER — PROMETHAZINE HCL 25 MG/ML IJ SOLN: 6.2500 mg | INTRAMUSCULAR | Status: DC | PRN

## 2018-05-19 MED ORDER — MIDAZOLAM HCL 2 MG/2ML IJ SOLN
1.0000 mg | INTRAMUSCULAR | Status: DC | PRN
  Administered 2018-05-19: 2 mg via INTRAVENOUS

## 2018-05-19 MED ORDER — BUPIVACAINE HCL (PF) 0.25 % IJ SOLN
INTRAMUSCULAR | Status: DC | PRN
  Administered 2018-05-19: 5 mL

## 2018-05-19 MED ORDER — LACTATED RINGERS IV SOLN
INTRAVENOUS | Status: DC
  Administered 2018-05-19: 09:00:00 via INTRAVENOUS

## 2018-05-19 MED ORDER — CHLORHEXIDINE GLUCONATE 4 % EX LIQD: 60.0000 mL | Freq: Once | CUTANEOUS | Status: DC

## 2018-05-19 MED ORDER — SCOPOLAMINE 1 MG/3DAYS TD PT72
MEDICATED_PATCH | TRANSDERMAL | Status: AC
  Filled 2018-05-19: qty 1

## 2018-05-19 SURGICAL SUPPLY — 57 items
APL SKNCLS STERI-STRIP NONHPOA (GAUZE/BANDAGES/DRESSINGS)
BANDAGE ACE 3X5.8 VEL STRL LF (GAUZE/BANDAGES/DRESSINGS) IMPLANT
BANDAGE COBAN STERILE 2 (GAUZE/BANDAGES/DRESSINGS) IMPLANT
BENZOIN TINCTURE PRP APPL 2/3 (GAUZE/BANDAGES/DRESSINGS) IMPLANT
BLADE MINI RND TIP GREEN BEAV (BLADE) IMPLANT
BLADE SURG 15 STRL LF DISP TIS (BLADE) ×2 IMPLANT
BLADE SURG 15 STRL SS (BLADE) ×6
BNDG CMPR 9X4 STRL LF SNTH (GAUZE/BANDAGES/DRESSINGS)
BNDG COHESIVE 1X5 TAN STRL LF (GAUZE/BANDAGES/DRESSINGS) IMPLANT
BNDG CONFORM 2 STRL LF (GAUZE/BANDAGES/DRESSINGS) IMPLANT
BNDG ELASTIC 2X5.8 VLCR STR LF (GAUZE/BANDAGES/DRESSINGS) IMPLANT
BNDG ESMARK 4X9 LF (GAUZE/BANDAGES/DRESSINGS) IMPLANT
BNDG GAUZE 1X2.1 STRL (MISCELLANEOUS) IMPLANT
BNDG GAUZE ELAST 4 BULKY (GAUZE/BANDAGES/DRESSINGS) ×2 IMPLANT
BNDG PLASTER X FAST 3X3 WHT LF (CAST SUPPLIES) IMPLANT
BNDG PLSTR 9X3 FST ST WHT (CAST SUPPLIES)
CHLORAPREP W/TINT 26ML (MISCELLANEOUS) ×3 IMPLANT
CLOSURE WOUND 1/2 X4 (GAUZE/BANDAGES/DRESSINGS)
CORD BIPOLAR FORCEPS 12FT (ELECTRODE) ×3 IMPLANT
COVER BACK TABLE 60X90IN (DRAPES) ×3 IMPLANT
COVER MAYO STAND STRL (DRAPES) ×3 IMPLANT
CUFF TOURNIQUET SINGLE 18IN (TOURNIQUET CUFF) ×3 IMPLANT
DRAPE EXTREMITY T 121X128X90 (DRAPE) ×3 IMPLANT
DRAPE SURG 17X23 STRL (DRAPES) ×3 IMPLANT
GAUZE SPONGE 4X4 12PLY STRL (GAUZE/BANDAGES/DRESSINGS) ×3 IMPLANT
GAUZE XEROFORM 1X8 LF (GAUZE/BANDAGES/DRESSINGS) ×3 IMPLANT
GLOVE BIO SURGEON STRL SZ 6.5 (GLOVE) ×1 IMPLANT
GLOVE BIO SURGEON STRL SZ7.5 (GLOVE) ×3 IMPLANT
GLOVE BIO SURGEONS STRL SZ 6.5 (GLOVE) ×1
GLOVE BIOGEL PI IND STRL 7.0 (GLOVE) IMPLANT
GLOVE BIOGEL PI IND STRL 8 (GLOVE) ×1 IMPLANT
GLOVE BIOGEL PI INDICATOR 7.0 (GLOVE) ×2
GLOVE BIOGEL PI INDICATOR 8 (GLOVE) ×4
GOWN STRL REUS W/ TWL LRG LVL3 (GOWN DISPOSABLE) ×1 IMPLANT
GOWN STRL REUS W/TWL LRG LVL3 (GOWN DISPOSABLE) ×3
GOWN STRL REUS W/TWL XL LVL3 (GOWN DISPOSABLE) ×3 IMPLANT
NDL HYPO 25X1 1.5 SAFETY (NEEDLE) ×1 IMPLANT
NEEDLE HYPO 25X1 1.5 SAFETY (NEEDLE) ×3 IMPLANT
NS IRRIG 1000ML POUR BTL (IV SOLUTION) ×3 IMPLANT
PACK BASIN DAY SURGERY FS (CUSTOM PROCEDURE TRAY) ×3 IMPLANT
PAD CAST 3X4 CTTN HI CHSV (CAST SUPPLIES) IMPLANT
PAD CAST 4YDX4 CTTN HI CHSV (CAST SUPPLIES) IMPLANT
PADDING CAST ABS 4INX4YD NS (CAST SUPPLIES) ×2
PADDING CAST ABS COTTON 4X4 ST (CAST SUPPLIES) ×1 IMPLANT
PADDING CAST COTTON 3X4 STRL (CAST SUPPLIES)
PADDING CAST COTTON 4X4 STRL (CAST SUPPLIES)
STOCKINETTE 4X48 STRL (DRAPES) ×3 IMPLANT
STRIP CLOSURE SKIN 1/2X4 (GAUZE/BANDAGES/DRESSINGS) IMPLANT
SUT ETHILON 3 0 PS 1 (SUTURE) IMPLANT
SUT ETHILON 4 0 PS 2 18 (SUTURE) ×3 IMPLANT
SUT ETHILON 5 0 P 3 18 (SUTURE)
SUT NYLON ETHILON 5-0 P-3 1X18 (SUTURE) IMPLANT
SUT VIC AB 4-0 P2 18 (SUTURE) IMPLANT
SYR BULB 3OZ (MISCELLANEOUS) ×3 IMPLANT
SYR CONTROL 10ML LL (SYRINGE) ×3 IMPLANT
TOWEL GREEN STERILE FF (TOWEL DISPOSABLE) ×6 IMPLANT
UNDERPAD 30X30 (UNDERPADS AND DIAPERS) ×3 IMPLANT

## 2018-05-19 NOTE — Discharge Instructions (Addendum)

## 2018-05-19 NOTE — Op Note (Signed)
NAME: Jackie Reed RECORD NO: [id number] DATE OF BIRTH: 1966/04/23 FACILITY: [facility] LOCATION:  [facility] PHYSICIAN: Tennis Must, MD   OPERATIVE REPORT   DATE OF PROCEDURE: 05/19/18    PREOPERATIVE DIAGNOSIS:   Right palmar nodules x2   POSTOPERATIVE DIAGNOSIS:   Right palmar fibromatosis   PROCEDURE:   Right palm excision of fascial nodules with palmar fasciectomy   SURGEON:  Leanora Cover, M.D.   ASSISTANT: none   ANESTHESIA:  General   INTRAVENOUS FLUIDS:  Per anesthesia flow sheet.   ESTIMATED BLOOD LOSS:  Minimal.   COMPLICATIONS:  None.   SPECIMENS:   Palmar nodules to pathology   TOURNIQUET TIME:    Total Tourniquet Time Documented: Upper Arm (Right) - 25 minutes Total: Upper Arm (Right) - 25 minutes    DISPOSITION:  Stable to PACU.   INDICATIONS: 52 year old female with palmar nodules.  These are bothersome to her.  She is tried nonoperative treatments.  She wishes to have them removed. Risks, benefits and alternatives of surgery were discussed including the risks of blood loss, infection, damage to nerves, vessels, tendons, ligaments, bone for surgery, need for additional surgery, complications with wound healing, continued pain, stiffness, recurrence of mass.  She voiced understanding of these risks and elected to proceed.  OPERATIVE COURSE:  After being identified preoperatively by myself,  the patient and I agreed on the procedure and site of the procedure.  The surgical site was marked.  Surgical consent had been signed. She was given IV Ancef as preoperative antibiotic prophylaxis. She was transferred to the operating room and placed on the operating table in supine position with the Right upper extremity on an arm board.  General anesthesia was induced by the anesthesiologist.  Right upper extremity was prepped and draped in normal sterile orthopedic fashion.  A surgical pause was performed between the surgeons, anesthesia, and  operating room staff and all were in agreement as to the patient, procedure, and site of procedure.  Tourniquet at the proximal aspect of the extremity was inflated to 250 mmHg after exsanguination of the arm with an Esmarch bandage.    A Bruner type incision was made over the nodules in the palm.  This is carried into this obtains tissues by spreading technique.  The nodules were within the palmar fascia.  This appeared to be a small Dupuytren's cord within the palm.  The neurovascular bundles on both the radial and ulnar sides were identified and protected throughout the case.  The fascial cord was carefully freed of any surrounding attachments.  It was removed and sent to pathology for examination.  Skin was placed back over top and the area palpated.  No more nodularity was noted.  The fascial cord had been adherent to the undersurface of the skin.  The wound was copiously irrigated with sterile saline and closed with 4-0 nylon in a horizontal mattress fashion.  It was injected with quarter percent plain Marcaine to aid in postoperative analgesia.  Was dressed with sterile Xeroform 4 x 4's and wrapped with a Coban dressing lightly.  The tourniquet was deflated at 125 minutes.  Fingertips were pink with brisk capillary refill after deflation of tourniquet.  The operative  drapes were broken down.  The patient was awoken from anesthesia safely.  She was transferred back to the stretcher and taken to PACU in stable condition.  I will see her back in the office in 1 week for postoperative followup.  I will give  her a prescription for Norco 5/325 1-2 tabs PO q6 hours prn pain, dispense # 20.   Leanora Cover, MD Electronically signed, 05/19/18

## 2018-05-19 NOTE — Transfer of Care (Signed)
Immediate Anesthesia Transfer of Care Note  Patient: Jackie Reed  Procedure(s) Performed: EXCISION PALMAR NODULES RIGHT HAND (Right Hand)  Patient Location: PACU  Anesthesia Type:General  Level of Consciousness: awake, alert  and drowsy  Airway & Oxygen Therapy: Patient Spontanous Breathing and Patient connected to face mask oxygen  Post-op Assessment: Report given to RN and Post -op Vital signs reviewed and stable  Post vital signs: Reviewed and stable  Last Vitals:  Vitals Value Taken Time  BP 174/94 05/19/2018 10:37 AM  Temp    Pulse 93 05/19/2018 10:39 AM  Resp 25 05/19/2018 10:39 AM  SpO2 100 % 05/19/2018 10:39 AM  Vitals shown include unvalidated device data.  Last Pain:  Vitals:   05/19/18 0821  TempSrc: Oral  PainSc: 2          Complications: No apparent anesthesia complications

## 2018-05-19 NOTE — Anesthesia Postprocedure Evaluation (Signed)
Anesthesia Post Note  Patient: Jackie Reed  Procedure(s) Performed: EXCISION PALMAR NODULES RIGHT HAND (Right Hand)     Patient location during evaluation: PACU Anesthesia Type: General Level of consciousness: awake and alert, oriented and awake Pain management: pain level controlled Vital Signs Assessment: post-procedure vital signs reviewed and stable Respiratory status: spontaneous breathing, nonlabored ventilation and respiratory function stable Cardiovascular status: blood pressure returned to baseline and stable Postop Assessment: no apparent nausea or vomiting Anesthetic complications: no    Last Vitals:  Vitals:   05/19/18 1115 05/19/18 1130  BP: (!) 154/91 (!) 161/90  Pulse: 63 62  Resp: 16 20  Temp:  36.6 C  SpO2: 97% 95%    Last Pain:  Vitals:   05/19/18 1130  TempSrc:   PainSc: Asleep                 Catalina Gravel

## 2018-05-19 NOTE — H&P (Signed)
Jackie Reed is an 52 y.o. female.   Chief Complaint: right palm nodules HPI: 52 yo female with nodules right palm.  These are bothersome to her and she wishes to have them removed.  Allergies:  Allergies  Allergen Reactions  . Morphine And Related Rash    At injection site  . Tramadol Nausea And Vomiting    Past Medical History:  Diagnosis Date  . Hypertension   . Migraine   . Plantar fibromatosis   . PONV (postoperative nausea and vomiting)     Past Surgical History:  Procedure Laterality Date  . ABDOMINAL HYSTERECTOMY    . FOOT SURGERY  1985  . PARTIAL HYSTERECTOMY  1997    Family History: Family History  Problem Relation Age of Onset  . Heart failure Mother   . Diabetes Mother   . Heart failure Father   . Hypertension Father   . Hyperlipidemia Father   . Colon cancer Neg Hx   . Colon polyps Neg Hx     Social History:   reports that she has never smoked. She has never used smokeless tobacco. She reports that she does not drink alcohol or use drugs.  Medications: Medications Prior to Admission  Medication Sig Dispense Refill  . losartan (COZAAR) 25 MG tablet Take 1 tablet (25 mg total) by mouth daily. 90 tablet 1  . Vitamin D, Ergocalciferol, (DRISDOL) 50000 units CAPS capsule Take 1 capsule (50,000 Units total) by mouth every 7 (seven) days. 30 capsule 2    No results found for this or any previous visit (from the past 48 hour(s)).  No results found.   A comprehensive review of systems was negative.  Blood pressure (!) 157/78, pulse 65, temperature 97.8 F (36.6 C), temperature source Oral, resp. rate 20, height 5\' 5"  (1.651 m), weight 97.5 kg, SpO2 100 %.  General appearance: alert, cooperative and appears stated age Head: Normocephalic, without obvious abnormality, atraumatic Neck: supple, symmetrical, trachea midline Cardio: regular rate and rhythm Resp: clear to auscultation bilaterally Extremities: Intact sensation and capillary refill all  digits.  +epl/fpl/io.  No wounds.  Pulses: 2+ and symmetric Skin: Skin color, texture, turgor normal. No rashes or lesions Neurologic: Grossly normal Incision/Wound: none  Assessment/Plan Right palm nodules.  Non operative and operative treatment options were discussed with the patient and patient wishes to proceed with operative treatment. Risks, benefits, and alternatives of surgery were discussed and the patient agrees with the plan of care.   Leanora Cover 05/19/2018, 8:37 AM

## 2018-05-19 NOTE — Anesthesia Procedure Notes (Signed)
Procedure Name: LMA Insertion Date/Time: 05/19/2018 10:54 AM Performed by: Willa Frater, CRNA Pre-anesthesia Checklist: Patient identified, Emergency Drugs available, Suction available and Patient being monitored Patient Re-evaluated:Patient Re-evaluated prior to induction Oxygen Delivery Method: Circle system utilized Preoxygenation: Pre-oxygenation with 100% oxygen Induction Type: IV induction Ventilation: Mask ventilation without difficulty LMA: LMA inserted LMA Size: 4.0 Number of attempts: 1 Airway Equipment and Method: Bite block Placement Confirmation: positive ETCO2 Tube secured with: Tape Dental Injury: Teeth and Oropharynx as per pre-operative assessment

## 2018-05-19 NOTE — Anesthesia Preprocedure Evaluation (Addendum)
Anesthesia Evaluation  Patient identified by MRN, date of birth, ID band Patient awake    Reviewed: Allergy & Precautions, NPO status , Patient's Chart, lab work & pertinent test results  History of Anesthesia Complications (+) PONV and history of anesthetic complications  Airway Mallampati: II  TM Distance: >3 FB Neck ROM: Full    Dental  (+) Teeth Intact, Dental Advisory Given   Pulmonary neg pulmonary ROS,    Pulmonary exam normal breath sounds clear to auscultation       Cardiovascular Exercise Tolerance: Good hypertension, Pt. on medications Normal cardiovascular exam Rhythm:Regular Rate:Normal     Neuro/Psych  Headaches, PSYCHIATRIC DISORDERS Anxiety Depression    GI/Hepatic negative GI ROS, Neg liver ROS,   Endo/Other  Obesity   Renal/GU negative Renal ROS     Musculoskeletal negative musculoskeletal ROS (+)   Abdominal   Peds  Hematology negative hematology ROS (+)   Anesthesia Other Findings Day of surgery medications reviewed with the patient.  Reproductive/Obstetrics                             Anesthesia Physical Anesthesia Plan  ASA: II  Anesthesia Plan: General   Post-op Pain Management:    Induction: Intravenous  PONV Risk Score and Plan: 4 or greater and Diphenhydramine, Scopolamine patch - Pre-op, Midazolam, Dexamethasone and Ondansetron  Airway Management Planned: LMA  Additional Equipment:   Intra-op Plan:   Post-operative Plan: Extubation in OR  Informed Consent: I have reviewed the patients History and Physical, chart, labs and discussed the procedure including the risks, benefits and alternatives for the proposed anesthesia with the patient or authorized representative who has indicated his/her understanding and acceptance.   Dental advisory given  Plan Discussed with: CRNA  Anesthesia Plan Comments:         Anesthesia Quick Evaluation

## 2018-05-20 ENCOUNTER — Encounter (HOSPITAL_BASED_OUTPATIENT_CLINIC_OR_DEPARTMENT_OTHER): Payer: Self-pay | Admitting: Orthopedic Surgery

## 2018-05-31 ENCOUNTER — Ambulatory Visit: Payer: Self-pay | Admitting: Family Medicine

## 2018-07-26 ENCOUNTER — Ambulatory Visit (HOSPITAL_COMMUNITY)
Admission: RE | Admit: 2018-07-26 | Discharge: 2018-07-26 | Disposition: A | Discharge status: Home / Self Care | Payer: BLUE CROSS/BLUE SHIELD | Source: Ambulatory Visit | Attending: Family Medicine | Admitting: Family Medicine

## 2018-07-26 ENCOUNTER — Ambulatory Visit (INDEPENDENT_AMBULATORY_CARE_PROVIDER_SITE_OTHER): Payer: BLUE CROSS/BLUE SHIELD | Admitting: Family Medicine

## 2018-07-26 ENCOUNTER — Telehealth: Payer: Self-pay

## 2018-07-26 ENCOUNTER — Encounter: Payer: Self-pay | Admitting: Family Medicine

## 2018-07-26 VITALS — BP 144/86 | HR 66 | Temp 97.8°F | Ht 65.0 in | Wt 215.6 lb

## 2018-07-26 DIAGNOSIS — J40 Bronchitis, not specified as acute or chronic: Secondary | ICD-10-CM | POA: Diagnosis not present

## 2018-07-26 MED ORDER — PROMETHAZINE-DM 6.25-15 MG/5ML PO SYRP: 5.0000 mL | ORAL_SOLUTION | Freq: Four times a day (QID) | ORAL | 0 refills | Status: DC | PRN

## 2018-07-26 MED ORDER — METHYLPREDNISOLONE 4 MG PO TBPK: ORAL_TABLET | ORAL | 0 refills | Status: DC

## 2018-07-26 MED ORDER — AZITHROMYCIN 250 MG PO TABS: ORAL_TABLET | ORAL | 0 refills | Status: DC

## 2018-07-26 MED ORDER — IPRATROPIUM BROMIDE 0.02 % IN SOLN
0.5000 mg | Freq: Once | RESPIRATORY_TRACT | Status: AC
  Administered 2018-07-26: 0.5 mg via RESPIRATORY_TRACT

## 2018-07-26 MED ORDER — ALBUTEROL SULFATE (2.5 MG/3ML) 0.083% IN NEBU
2.5000 mg | INHALATION_SOLUTION | Freq: Once | RESPIRATORY_TRACT | Status: AC
  Administered 2018-07-26: 2.5 mg via RESPIRATORY_TRACT

## 2018-07-26 NOTE — Telephone Encounter (Signed)
-----   Message from Lanae Boast, [place] sent at 07/26/2018  1:08 PM EST ----- Her chest xray showed bronchitis. No pneumonia

## 2018-07-26 NOTE — Telephone Encounter (Signed)
Called, no answer. Left a message on home number to call back. Thanks!

## 2018-07-26 NOTE — Progress Notes (Signed)
Acute Office Visit  Subjective:    Patient ID: Jackie Reed, female    DOB: 11/20/1965, 52 y.o.   MRN: [id number]  Chief Complaint  Patient presents with  . Cough  . Chest congestion    Cough  This is a new problem. The current episode started 1 to 4 weeks ago. The problem has been gradually worsening. The problem occurs every few minutes. The cough is non-productive. Associated symptoms include chills, headaches, shortness of breath and sweats. Pertinent negatives include no fever, rhinorrhea, sore throat or wheezing. The symptoms are aggravated by lying down. She has tried OTC cough suppressant for the symptoms. The treatment provided mild relief. There is no history of asthma, bronchitis, environmental allergies or pneumonia.   Patient is in today for chest congestion and cough x 1 week. . States that she has been taking otc medications without full relief. Would like to be assessed for pneumonia.   Past Medical History:  Diagnosis Date  . Hypertension   . Migraine   . Plantar fibromatosis   . PONV (postoperative nausea and vomiting)     Past Surgical History:  Procedure Laterality Date  . ABDOMINAL HYSTERECTOMY    . FOOT SURGERY  1985  . MASS EXCISION Right 05/19/2018   Procedure: EXCISION PALMAR NODULES RIGHT HAND;  Surgeon: Leanora Cover, MD;  Location: [facility];  Service: Orthopedics;  Laterality: Right;  . PARTIAL HYSTERECTOMY  1997    Family History  Problem Relation Age of Onset  . Heart failure Mother   . Diabetes Mother   . Heart failure Father   . Hypertension Father   . Hyperlipidemia Father   . Colon cancer Neg Hx   . Colon polyps Neg Hx     Social History   Socioeconomic History  . Marital status: Widowed    Spouse name: Not on file  . Number of children: Not on file  . Years of education: Not on file  . Highest education level: Not on file  Occupational History  . Not on file  Social Needs  . Financial resource strain: Not on  file  . Food insecurity:    Worry: Not on file    Inability: Not on file  . Transportation needs:    Medical: Not on file    Non-medical: Not on file  Tobacco Use  . Smoking status: Never Smoker  . Smokeless tobacco: Never Used  Substance and Sexual Activity  . Alcohol use: No    Alcohol/week: 0.0 standard drinks  . Drug use: No  . Sexual activity: Yes    Birth control/protection: None  Lifestyle  . Physical activity:    Days per week: Not on file    Minutes per session: Not on file  . Stress: Not on file  Relationships  . Social connections:    Talks on phone: Not on file    Gets together: Not on file    Attends religious service: Not on file    Active member of club or organization: Not on file    Attends meetings of clubs or organizations: Not on file    Relationship status: Not on file  . Intimate partner violence:    Fear of current or ex partner: Not on file    Emotionally abused: Not on file    Physically abused: Not on file    Forced sexual activity: Not on file  Other Topics Concern  . Not on file  Social History Narrative  .  Not on file    Outpatient Medications Prior to Visit  Medication Sig Dispense Refill  . losartan (COZAAR) 25 MG tablet Take 1 tablet (25 mg total) by mouth daily. 90 tablet 1  . Vitamin D, Ergocalciferol, (DRISDOL) 50000 units CAPS capsule Take 1 capsule (50,000 Units total) by mouth every 7 (seven) days. 30 capsule 2  . HYDROcodone-acetaminophen (NORCO) 5-325 MG tablet 1-2 tabs po q6 hours prn pain (Patient not taking: Reported on 07/26/2018) 20 tablet 0   No facility-administered medications prior to visit.     Allergies  Allergen Reactions  . Morphine And Related Rash    At injection site  . Tramadol Nausea And Vomiting    Review of Systems  Constitutional: Positive for chills. Negative for fever.  HENT: Negative for rhinorrhea and sore throat.        Hoarse voice   Respiratory: Positive for cough and shortness of breath.  Negative for wheezing.   Neurological: Positive for headaches.  Endo/Heme/Allergies: Negative for environmental allergies.       Objective:    Physical Exam  Constitutional: She is oriented to person, place, and time. She appears well-developed and well-nourished. No distress.  HENT:  Head: Normocephalic and atraumatic.  Mouth/Throat: Oropharynx is clear and moist. No oropharyngeal exudate.  Eyes: Pupils are equal, round, and reactive to light. Conjunctivae and EOM are normal.  Neck: Normal range of motion.  Cardiovascular: Normal rate, regular rhythm and normal heart sounds.  Pulmonary/Chest: Effort normal. No respiratory distress. She has wheezes (mild expiratory throughout all lung fields. ).  Musculoskeletal: Normal range of motion.  Lymphadenopathy:    She has no cervical adenopathy.  Neurological: She is alert and oriented to person, place, and time.  Skin: Skin is warm and dry.  Psychiatric: She has a normal mood and affect. Her behavior is normal. Judgment and thought content normal.  Nursing note and vitals reviewed.   BP (!) 144/86 (BP Location: Left Arm, Patient Position: Sitting, Cuff Size: Large)   Pulse 66   Temp 97.8 F (36.6 C) (Oral)   Ht 5\' 5"  (1.651 m)   Wt 215 lb 9.6 oz (97.8 kg)   SpO2 98%   BMI 35.88 kg/m  Wt Readings from Last 3 Encounters:  07/26/18 215 lb 9.6 oz (97.8 kg)  05/19/18 214 lb 15.2 oz (97.5 kg)  03/25/18 215 lb (97.5 kg)    Health Maintenance Due  Topic Date Due  . HIV Screening  03/15/1980    There are no preventive care reminders to display for this patient.   Lab Results  Component Value Date   TSH 1.070 03/25/2018   Lab Results  Component Value Date   WBC 4.8 03/25/2018   HGB 12.1 03/25/2018   HCT 39.4 03/25/2018   MCV 72 (L) 03/25/2018   PLT 255 03/25/2018   Lab Results  Component Value Date   NA 142 03/25/2018   K 3.9 03/25/2018   CO2 21 03/25/2018   GLUCOSE 69 03/25/2018   BUN 10 03/25/2018   CREATININE  0.70 03/25/2018   BILITOT 0.4 03/25/2018   ALKPHOS 175 (H) 03/25/2018   AST 20 03/25/2018   ALT 13 03/25/2018   PROT 7.9 03/25/2018   ALBUMIN 4.4 03/25/2018   CALCIUM 9.6 03/25/2018   ANIONGAP 10 10/02/2017   Lab Results  Component Value Date   CHOL 161 03/25/2018   Lab Results  Component Value Date   HDL 47 03/25/2018   Lab Results  Component Value Date  Halfway 76 03/25/2018   Lab Results  Component Value Date   TRIG 192 (H) 03/25/2018   Lab Results  Component Value Date   CHOLHDL 3.4 03/25/2018   Lab Results  Component Value Date   HGBA1C 5.6 11/29/2017       Assessment & Plan:   Problem List Items Addressed This Visit    None    Visit Diagnoses    Bronchitis    -  Primary   Relevant Medications   albuterol (PROVENTIL) (2.5 MG/3ML) 0.083% nebulizer solution 2.5 mg (Start on 07/26/2018  8:45 AM)   ipratropium (ATROVENT) nebulizer solution 0.5 mg (Start on 07/26/2018  8:45 AM)   methylPREDNISolone (MEDROL DOSEPAK) 4 MG TBPK tablet   azithromycin (ZITHROMAX) 250 MG tablet   promethazine-dextromethorphan (PROMETHAZINE-DM) 6.25-15 MG/5ML syrup   Other Relevant Orders   DG Chest 2 View       Meds ordered this encounter  Medications  . albuterol (PROVENTIL) (2.5 MG/3ML) 0.083% nebulizer solution 2.5 mg  . ipratropium (ATROVENT) nebulizer solution 0.5 mg  . methylPREDNISolone (MEDROL DOSEPAK) 4 MG TBPK tablet    Sig: Take as directed on pack    Dispense:  21 tablet    Refill:  0  . azithromycin (ZITHROMAX) 250 MG tablet    Sig: Take as directed on z pack    Dispense:  6 tablet    Refill:  0  . promethazine-dextromethorphan (PROMETHAZINE-DM) 6.25-15 MG/5ML syrup    Sig: Take 5 mLs by mouth 4 (four) times daily as needed for cough.    Dispense:  118 mL    Refill:  0     Lanae Boast, FNP

## 2018-07-26 NOTE — Progress Notes (Signed)
Her chest xray showed bronchitis. No pneumonia

## 2018-07-26 NOTE — Patient Instructions (Signed)

## 2018-07-27 NOTE — Telephone Encounter (Signed)
Called, no answer.

## 2018-10-21 ENCOUNTER — Encounter (HOSPITAL_COMMUNITY): Payer: Self-pay

## 2018-10-21 ENCOUNTER — Emergency Department (HOSPITAL_COMMUNITY): Payer: Worker's Compensation

## 2018-10-21 ENCOUNTER — Other Ambulatory Visit: Payer: Self-pay

## 2018-10-21 ENCOUNTER — Emergency Department (HOSPITAL_COMMUNITY)
Admission: EM | Admit: 2018-10-21 | Discharge: 2018-10-21 | Disposition: A | Discharge status: Home / Self Care | Payer: Worker's Compensation | Attending: Emergency Medicine | Admitting: Emergency Medicine

## 2018-10-21 DIAGNOSIS — S8001XA Contusion of right knee, initial encounter: Secondary | ICD-10-CM | POA: Diagnosis not present

## 2018-10-21 DIAGNOSIS — Y990 Civilian activity done for income or pay: Secondary | ICD-10-CM | POA: Diagnosis not present

## 2018-10-21 DIAGNOSIS — M542 Cervicalgia: Secondary | ICD-10-CM | POA: Diagnosis not present

## 2018-10-21 DIAGNOSIS — W010XXA Fall on same level from slipping, tripping and stumbling without subsequent striking against object, initial encounter: Secondary | ICD-10-CM | POA: Diagnosis not present

## 2018-10-21 DIAGNOSIS — Y92481 Parking lot as the place of occurrence of the external cause: Secondary | ICD-10-CM | POA: Insufficient documentation

## 2018-10-21 DIAGNOSIS — S99911A Unspecified injury of right ankle, initial encounter: Secondary | ICD-10-CM | POA: Diagnosis present

## 2018-10-21 DIAGNOSIS — M549 Dorsalgia, unspecified: Secondary | ICD-10-CM | POA: Diagnosis not present

## 2018-10-21 DIAGNOSIS — M545 Low back pain: Secondary | ICD-10-CM | POA: Diagnosis not present

## 2018-10-21 DIAGNOSIS — S0990XA Unspecified injury of head, initial encounter: Secondary | ICD-10-CM | POA: Diagnosis not present

## 2018-10-21 DIAGNOSIS — Y9389 Activity, other specified: Secondary | ICD-10-CM | POA: Diagnosis not present

## 2018-10-21 DIAGNOSIS — M25551 Pain in right hip: Secondary | ICD-10-CM | POA: Diagnosis not present

## 2018-10-21 DIAGNOSIS — I10 Essential (primary) hypertension: Secondary | ICD-10-CM | POA: Diagnosis not present

## 2018-10-21 DIAGNOSIS — S93401A Sprain of unspecified ligament of right ankle, initial encounter: Secondary | ICD-10-CM | POA: Diagnosis not present

## 2018-10-21 DIAGNOSIS — W19XXXA Unspecified fall, initial encounter: Secondary | ICD-10-CM

## 2018-10-21 LAB — CBC WITH DIFFERENTIAL/PLATELET
Abs Immature Granulocytes: 0.09 10*3/uL — ABNORMAL HIGH (ref 0.00–0.07)
Basophils Absolute: 0 10*3/uL (ref 0.0–0.1)
Basophils Relative: 0 %
Eosinophils Absolute: 0.1 10*3/uL (ref 0.0–0.5)
Eosinophils Relative: 1 %
HCT: 38 % (ref 36.0–46.0)
Hemoglobin: 11.5 g/dL — ABNORMAL LOW (ref 12.0–15.0)
Immature Granulocytes: 1 %
Lymphocytes Relative: 19 %
Lymphs Abs: 1.7 10*3/uL (ref 0.7–4.0)
MCH: 22.2 pg — ABNORMAL LOW (ref 26.0–34.0)
MCHC: 30.3 g/dL (ref 30.0–36.0)
MCV: 73.5 fL — ABNORMAL LOW (ref 80.0–100.0)
Monocytes Absolute: 0.8 10*3/uL (ref 0.1–1.0)
Monocytes Relative: 9 %
Neutro Abs: 6.6 10*3/uL (ref 1.7–7.7)
Neutrophils Relative %: 70 %
Platelets: 351 10*3/uL (ref 150–400)
RBC: 5.17 MIL/uL — ABNORMAL HIGH (ref 3.87–5.11)
RDW: 15.6 % — ABNORMAL HIGH (ref 11.5–15.5)
WBC: 9.4 10*3/uL (ref 4.0–10.5)
nRBC: 0 % (ref 0.0–0.2)

## 2018-10-21 LAB — COMPREHENSIVE METABOLIC PANEL
ALT: 12 U/L (ref 0–44)
AST: 19 U/L (ref 15–41)
Albumin: 4 g/dL (ref 3.5–5.0)
Alkaline Phosphatase: 154 U/L — ABNORMAL HIGH (ref 38–126)
Anion gap: 11 (ref 5–15)
BUN: 15 mg/dL (ref 6–20)
CO2: 18 mmol/L — ABNORMAL LOW (ref 22–32)
Calcium: 9 mg/dL (ref 8.9–10.3)
Chloride: 108 mmol/L (ref 98–111)
Creatinine, Ser: 0.63 mg/dL (ref 0.44–1.00)
GFR calc Af Amer: >60 mL/min (ref >60)
GFR calc non Af Amer: >60 mL/min (ref >60)
Glucose, Bld: 178 mg/dL — ABNORMAL HIGH (ref 70–99)
Potassium: 3.2 mmol/L — ABNORMAL LOW (ref 3.5–5.1)
Sodium: 137 mmol/L (ref 135–145)
Total Bilirubin: 0.6 mg/dL (ref 0.3–1.2)
Total Protein: 7.9 g/dL (ref 6.5–8.1)

## 2018-10-21 LAB — PROTIME-INR
INR: 1.1 (ref 0.8–1.2)
Prothrombin Time: 13.6 seconds (ref 11.4–15.2)

## 2018-10-21 MED ORDER — PROMETHAZINE HCL 25 MG/ML IJ SOLN
12.5000 mg | Freq: Once | INTRAMUSCULAR | Status: AC
  Administered 2018-10-21: 12.5 mg via INTRAVENOUS
  Filled 2018-10-21: qty 1

## 2018-10-21 MED ORDER — ONDANSETRON HCL 4 MG/2ML IJ SOLN: 4.0000 mg | Freq: Once | INTRAMUSCULAR | Status: DC

## 2018-10-21 MED ORDER — ONDANSETRON HCL 4 MG/2ML IJ SOLN
4.0000 mg | Freq: Once | INTRAMUSCULAR | Status: AC
  Administered 2018-10-21: 4 mg via INTRAVENOUS
  Filled 2018-10-21: qty 2

## 2018-10-21 MED ORDER — MORPHINE SULFATE (PF) 4 MG/ML IV SOLN
4.0000 mg | Freq: Once | INTRAVENOUS | Status: DC
  Filled 2018-10-21: qty 1

## 2018-10-21 MED ORDER — HYDROMORPHONE HCL 1 MG/ML IJ SOLN
1.0000 mg | Freq: Once | INTRAMUSCULAR | Status: AC
  Administered 2018-10-21: 1 mg via INTRAVENOUS
  Filled 2018-10-21: qty 1

## 2018-10-21 MED ORDER — ONDANSETRON 4 MG PO TBDP: 4.0000 mg | ORAL_TABLET | Freq: Three times a day (TID) | ORAL | 0 refills | Status: DC | PRN

## 2018-10-21 NOTE — ED Notes (Signed)
Attempted to ambulate pt with 2 assists.   She was c/o R ankle pain and was unable to bear weight.  Hina EDPA made aware.

## 2018-10-21 NOTE — ED Provider Notes (Signed)
Physical Exam  BP (!) 150/61 (BP Location: Left Arm)   Pulse 63   Temp 97.6 F (36.4 C) (Oral)   Resp 18   Ht 5\' 5"  (1.651 m)   Wt 97.5 kg   SpO2 100%   BMI 35.78 kg/m   Physical Exam Vitals signs and nursing note reviewed.  Constitutional:      General: She is not in acute distress.    Appearance: She is well-developed. She is not diaphoretic.  HENT:     Head: Normocephalic and atraumatic.  Eyes:     General: No scleral icterus.    Conjunctiva/sclera: Conjunctivae normal.  Neck:     Musculoskeletal: Normal range of motion.  Pulmonary:     Effort: Pulmonary effort is normal. No respiratory distress.  Skin:    Findings: No rash.  Neurological:     Mental Status: She is alert.     ED Course/Procedures     Procedures  MDM  Care handed off from previous provider, PA Henderly. Please see their note for further detail. Briefly, patient is a 53yo F presenting to ED with a chief complaint of R hip pain after mechanical fall. She fell in the parking lot of her job. She has not been ambulatory since her fall. She complains of headache, neck pain, back pain, R hip pain, R knee and R ankle pain. No deformities or abnormalities seen on exam. Screening labwork is reassuring. She has been vomiting. Nausea and pain medications given here. Awaiting imaging. Will discharge home if imaging negative.   3:45 PM Dg Thoracic Spine 2 View  Result Date: 10/21/2018 CLINICAL DATA:  Upper back pain after fall today. EXAM: THORACIC SPINE 2 VIEWS COMPARISON:  Radiographs of July 26, 2018. FINDINGS: There is no evidence of thoracic spine fracture. Alignment is normal. No other significant bone abnormalities are identified. IMPRESSION: Negative. Electronically Signed   By: Marijo Conception, M.D.   On: 10/21/2018 15:18   Dg Lumbar Spine Complete  Result Date: 10/21/2018 CLINICAL DATA:  Acute low back pain after fall today. EXAM: LUMBAR SPINE - COMPLETE 4+ VIEW COMPARISON:  MRI of October 15, 2008 FINDINGS: There is no evidence of lumbar spine fracture. Alignment is normal. Intervertebral disc spaces are maintained. IMPRESSION: Negative. Electronically Signed   By: Marijo Conception, M.D.   On: 10/21/2018 15:20   Dg Ankle Complete Right  Result Date: 10/21/2018 CLINICAL DATA:  Right ankle pain after fall today. EXAM: RIGHT ANKLE - COMPLETE 3+ VIEW COMPARISON:  None. FINDINGS: There is no evidence of fracture, dislocation, or joint effusion. There is no evidence of arthropathy or other focal bone abnormality. Soft tissues are unremarkable. IMPRESSION: Negative. Electronically Signed   By: Marijo Conception, M.D.   On: 10/21/2018 15:22   Ct Head Wo Contrast  Result Date: 10/21/2018 CLINICAL DATA:  Tripped over parking block. EXAM: CT HEAD WITHOUT CONTRAST CT CERVICAL SPINE WITHOUT CONTRAST TECHNIQUE: Multidetector CT imaging of the head and cervical spine was performed following the standard protocol without intravenous contrast. Multiplanar CT image reconstructions of the cervical spine were also generated. COMPARISON:  Head CT 08/25/2017. Cervical spine radiographs 01/08/2012. FINDINGS: CT HEAD FINDINGS Brain: There is no evidence of acute infarct, intracranial hemorrhage, mass, midline shift, or extra-axial fluid collection. The ventricles and sulci are within normal limits for age. Scattered cerebral white matter hypodensities are similar to the prior CT, mild for age, and nonspecific. Vascular: No hyperdense vessel. Skull: No fracture or focal osseous lesion. Sinuses/Orbits:  Visualized paranasal sinuses and mastoid air cells are clear. Orbits are unremarkable. Other: None. CT CERVICAL SPINE FINDINGS Alignment: Cervical spine straightening. No listhesis. Skull base and vertebrae: Small chronic defect in the posterior C1 arch on the left, likely developmental. No acute fracture or suspicious osseous lesion. Soft tissues and spinal canal: No prevertebral fluid or swelling. No visible canal hematoma.  Disc levels:  No significant degenerative changes are seen for age. Upper chest: Clear lung apices. Other: None. IMPRESSION: 1. No evidence of acute intracranial abnormality. 2. No acute cervical spine fracture or subluxation. Electronically Signed   By: Logan Bores M.D.   On: 10/21/2018 15:34   Ct Cervical Spine Wo Contrast  Result Date: 10/21/2018 CLINICAL DATA:  Tripped over parking block. EXAM: CT HEAD WITHOUT CONTRAST CT CERVICAL SPINE WITHOUT CONTRAST TECHNIQUE: Multidetector CT imaging of the head and cervical spine was performed following the standard protocol without intravenous contrast. Multiplanar CT image reconstructions of the cervical spine were also generated. COMPARISON:  Head CT 08/25/2017. Cervical spine radiographs 01/08/2012. FINDINGS: CT HEAD FINDINGS Brain: There is no evidence of acute infarct, intracranial hemorrhage, mass, midline shift, or extra-axial fluid collection. The ventricles and sulci are within normal limits for age. Scattered cerebral white matter hypodensities are similar to the prior CT, mild for age, and nonspecific. Vascular: No hyperdense vessel. Skull: No fracture or focal osseous lesion. Sinuses/Orbits: Visualized paranasal sinuses and mastoid air cells are clear. Orbits are unremarkable. Other: None. CT CERVICAL SPINE FINDINGS Alignment: Cervical spine straightening. No listhesis. Skull base and vertebrae: Small chronic defect in the posterior C1 arch on the left, likely developmental. No acute fracture or suspicious osseous lesion. Soft tissues and spinal canal: No prevertebral fluid or swelling. No visible canal hematoma. Disc levels:  No significant degenerative changes are seen for age. Upper chest: Clear lung apices. Other: None. IMPRESSION: 1. No evidence of acute intracranial abnormality. 2. No acute cervical spine fracture or subluxation. Electronically Signed   By: Logan Bores M.D.   On: 10/21/2018 15:34   Dg Knee Complete 4 Views Right  Result Date:  10/21/2018 CLINICAL DATA:  Right knee pain after fall today. EXAM: RIGHT KNEE - COMPLETE 4+ VIEW COMPARISON:  None. FINDINGS: No evidence of fracture, dislocation, or joint effusion. Minimal narrowing of medial joint space is noted. Soft tissues are unremarkable. IMPRESSION: Minimal degenerative joint disease is noted medially. No acute abnormality seen in the right knee. Electronically Signed   By: Marijo Conception, M.D.   On: 10/21/2018 15:21   Dg Hip Unilat W Or Wo Pelvis 2-3 Views Right  Result Date: 10/21/2018 CLINICAL DATA:  Fall, right hip pain. EXAM: DG HIP (WITH OR WITHOUT PELVIS) 2-3V RIGHT COMPARISON:  None. FINDINGS: There is no evidence of hip fracture or dislocation. There is no evidence of arthropathy or other focal bone abnormality. IMPRESSION: Negative. Electronically Signed   By: Rolm Baptise M.D.   On: 10/21/2018 15:31   Imaging is negative. Patient continues to have vomiting. Will give Phenergan and reassess. Will attempt to ambulate when symptoms controlled.   6:24 PM Patient ambulatory but reports pain with bearing weight on right ankle.  She is requesting crutches. Nausea and pain further controlled in ED. Will advise RICE therapy, PCP f/u and strict return precautions.   Patient is hemodynamically stable, in NAD, and able to ambulate in the ED. Evaluation does not show pathology that would require ongoing emergent intervention or inpatient treatment. I explained the diagnosis to the patient. Pain has  been managed and has no complaints prior to discharge. Patient is comfortable with above plan and is stable for discharge at this time. All questions were answered prior to disposition. Strict return precautions for returning to the ED were discussed. Encouraged follow up with PCP.    Portions of this note were generated with [employer]. Dictation errors may occur despite best attempts at proofreading.     Delia Heady, PA-C 10/21/18 1826    Maudie Flakes,  MD 10/21/18 [phone]

## 2018-10-21 NOTE — ED Notes (Signed)
Pt has RT leg internal rotation with lengthening. +RT pedal pulse, motor, and sensory

## 2018-10-21 NOTE — Discharge Instructions (Addendum)
You can alternate Tylenol and ibuprofen as needed to help with pain. Take Zofran as needed for nausea. Wear the ankle brace as directed use crutches as needed. Return to the ED if you start to have worsening symptoms, additional injuries or falls, joint swelling, severe headache, blurry vision, numbness in arms or legs.

## 2018-10-21 NOTE — ED Triage Notes (Signed)
Per EMS: Pt tripped backwards over parking block.  Pt denies hitting head,  LOC and denies taking blood-thinners.  Pt c/o of R ankle, thigh, and hip pain.  Pt has had 100 mcg of fentanyl with relief.

## 2018-10-21 NOTE — ED Provider Notes (Signed)
[facility] DEPT Provider Note   CSN: [id number] Arrival date & time: 10/21/18  1141  History   Chief Complaint Chief Complaint  Patient presents with  . Fall  . R hip pain    HPI MONE COMMISSO is a 53 y.o. female with medical history significant for hypertension who presents for evaluation after mechanical fall.  Patient states she was at work earlier today during a [employer] when she was backing out of the parking lot walking backwards that she tripped over the cement block of a parking space.  Patient states she fell "flat back."  Patient states she was not able to ambulate immediately after the incident.  Her coworkers told her "not to move."  Patient states she has had an episode of nonbloody, nonbilious emesis after the incident.  Has mild head pain which she rates a 2/10.  She has pain located to her thoracic and lumbar spine as well as her right hip right knee and right ankle.  Rates her pain a 6/10.  States her right ankle pain radiates down her leg.  Unable to describe her pain.  Denies fever, chills, nausea, lightheaded or dizziness, vision changes, unilateral weakness, numbness or tingling in her extremities, redness, warmth, swelling, bruising to her extremities. Denies use of anticoagulation. NO LOC.  History obtained from patient.  No interpreter was used.  EMS gave fentanyl in route.     HPI  Past Medical History:  Diagnosis Date  . Hypertension   . Migraine   . Plantar fibromatosis   . PONV (postoperative nausea and vomiting)     Patient Active Problem List   Diagnosis Date Noted  . Essential hypertension, benign 11/08/2015  . Tendon calcification 11/08/2015  . Adjustment disorder with mixed anxiety and depressed mood 11/08/2015  . Insomnia 11/08/2015    Past Surgical History:  Procedure Laterality Date  . ABDOMINAL HYSTERECTOMY    . FOOT SURGERY  1985  . MASS EXCISION Right 05/19/2018   Procedure: EXCISION PALMAR NODULES  RIGHT HAND;  Surgeon: Leanora Cover, MD;  Location: [facility];  Service: Orthopedics;  Laterality: Right;  . PARTIAL HYSTERECTOMY  1997     OB History   No obstetric history on file.      Home Medications    Prior to Admission medications   Medication Sig Start Date End Date Taking? Authorizing Provider  azithromycin (ZITHROMAX) 250 MG tablet Take as directed on z pack Patient not taking: Reported on 10/21/2018 07/26/18   Lanae Boast, FNP  HYDROcodone-acetaminophen [facility]) 5-325 MG tablet 1-2 tabs po q6 hours prn pain Patient not taking: Reported on 07/26/2018 05/19/18   Leanora Cover, MD  losartan (COZAAR) 25 MG tablet Take 1 tablet (25 mg total) by mouth daily. Patient not taking: Reported on 10/21/2018 09/03/17   Scot Jun, FNP  methylPREDNISolone (MEDROL DOSEPAK) 4 MG TBPK tablet Take as directed on pack Patient not taking: Reported on 10/21/2018 07/26/18   Lanae Boast, FNP  promethazine-dextromethorphan (PROMETHAZINE-DM) 6.25-15 MG/5ML syrup Take 5 mLs by mouth 4 (four) times daily as needed for cough. Patient not taking: Reported on 10/21/2018 07/26/18   Lanae Boast, FNP  Vitamin D, Ergocalciferol, (DRISDOL) 50000 units CAPS capsule Take 1 capsule (50,000 Units total) by mouth every 7 (seven) days. Patient not taking: Reported on 10/21/2018 03/27/18   Azzie Glatter, FNP    Family History Family History  Problem Relation Age of Onset  . Heart failure Mother   . Diabetes  Mother   . Heart failure Father   . Hypertension Father   . Hyperlipidemia Father   . Colon cancer Neg Hx   . Colon polyps Neg Hx     Social History Social History   Tobacco Use  . Smoking status: Never Smoker  . Smokeless tobacco: Never Used  Substance Use Topics  . Alcohol use: No    Alcohol/week: 0.0 standard drinks  . Drug use: No     Allergies   Morphine and related and Tramadol   Review of Systems Review of Systems  Constitutional: Negative.   HENT:  Negative.   Eyes: Negative.   Respiratory: Negative.   Cardiovascular: Negative.   Gastrointestinal: Negative.   Genitourinary: Negative.   Musculoskeletal: Positive for back pain and neck pain.       Midline thoracic, lumbar back pain, right hip pain, right knee pain right ankle pain.  Skin: Negative.   Neurological: Positive for headaches. Negative for dizziness, tremors, seizures, syncope, facial asymmetry, speech difficulty, weakness, light-headedness and numbness.  All other systems reviewed and are negative.    Physical Exam Updated Vital Signs BP (!) 155/102 (BP Location: Right Arm)   Pulse 97   Temp 97.6 F (36.4 C) (Oral)   Resp 16   Ht 5\' 5"  (1.651 m)   Wt 97.5 kg   SpO2 100%   BMI 35.78 kg/m   Physical Exam  Physical Exam  Constitutional: Pt appears well-developed and well-nourished. No distress.  HENT:  Head: Normocephalic and atraumatic.  Mouth/Throat: Oropharynx is clear and moist. No oropharyngeal exudate.  Eyes: Conjunctivae are normal.  Neck: Normal range of motion. Neck supple.   Mild midline cervical neck tenderness palpation. Cardiovascular: Normal rate, regular rhythm and intact distal pulses.   Pulmonary/Chest: Effort normal and breath sounds normal. No respiratory distress. Pt has no wheezes.  Abdominal: Soft. Pt exhibits no distension. There is no tenderness, rebound or guarding. No abd bruit or pulsatile mass Musculoskeletal:   Midline thoracic and lumbar spinal tenderness palpation with logroll.  Full range of motion lateral upper extremities without difficulty with flexion, extension, abduction and abduction.  Range of motion left lower extremity with flexion, extension, plantarflexion dorsiflexion.  Able to straight leg raise without difficulty.  Patient unable to form ROS to hip and knee secondary to pain.  Patient able to plantarflex and dorsiflex at ankle without difficulty.  Can wiggle toes without difficulty.  No shortening or rotation of  bilateral legs.  No tenderness over bilateral pelvis.  Tenderness over piriformis muscle on right hip. Lymphadenopathy:    Pt has no cervical adenopathy.  Neurological: Pt is alert.  Intact sensation sharp and dull bilateral lower extremities Speech is clear and goal oriented, follows commands No Clonus Mental Status:  Alert, oriented, thought content appropriate. Speech fluent without evidence of aphasia. Able to follow 2 step commands without difficulty.  Cranial Nerves:  II:  Peripheral visual fields grossly normal, pupils equal, round, reactive to light III,IV, VI: ptosis not present, extra-ocular motions intact bilaterally  V,VII: smile symmetric, facial light touch sensation equal VIII: hearing grossly normal bilaterally  IX,X: midline uvula rise  XI: bilateral shoulder shrug equal and strong XII: midline tongue extension  Motor:  Cerebellar: normal finger-to-nose with bilateral upper extremities Skin: Skin is warm and dry. No rash noted or lesions noted. Pt is not diaphoretic. No erythema, ecchymosis,edema or warmth.  Psychiatric: Pt has a normal mood and affect. Behavior is normal.  Nursing note and vitals reviewed.  ED Treatments / Results  Labs (all labs ordered are listed, but only abnormal results are displayed) Labs Reviewed  CBC WITH DIFFERENTIAL/PLATELET - Abnormal; Notable for the following components:      Result Value   RBC 5.17 (*)    Hemoglobin 11.5 (*)    MCV 73.5 (*)    MCH 22.2 (*)    RDW 15.6 (*)    Abs Immature Granulocytes 0.09 (*)    All other components within normal limits  COMPREHENSIVE METABOLIC PANEL - Abnormal; Notable for the following components:   Potassium 3.2 (*)    CO2 18 (*)    Glucose, Bld 178 (*)    Alkaline Phosphatase 154 (*)    All other components within normal limits  PROTIME-INR    EKG None  Radiology Dg Thoracic Spine 2 View  Result Date: 10/21/2018 CLINICAL DATA:  Upper back pain after fall today. EXAM: THORACIC  SPINE 2 VIEWS COMPARISON:  Radiographs of July 26, 2018. FINDINGS: There is no evidence of thoracic spine fracture. Alignment is normal. No other significant bone abnormalities are identified. IMPRESSION: Negative. Electronically Signed   By: Marijo Conception, M.D.   On: 10/21/2018 15:18   Dg Lumbar Spine Complete  Result Date: 10/21/2018 CLINICAL DATA:  Acute low back pain after fall today. EXAM: LUMBAR SPINE - COMPLETE 4+ VIEW COMPARISON:  MRI of October 15, 2008 FINDINGS: There is no evidence of lumbar spine fracture. Alignment is normal. Intervertebral disc spaces are maintained. IMPRESSION: Negative. Electronically Signed   By: Marijo Conception, M.D.   On: 10/21/2018 15:20   Dg Ankle Complete Right  Result Date: 10/21/2018 CLINICAL DATA:  Right ankle pain after fall today. EXAM: RIGHT ANKLE - COMPLETE 3+ VIEW COMPARISON:  None. FINDINGS: There is no evidence of fracture, dislocation, or joint effusion. There is no evidence of arthropathy or other focal bone abnormality. Soft tissues are unremarkable. IMPRESSION: Negative. Electronically Signed   By: Marijo Conception, M.D.   On: 10/21/2018 15:22   Dg Knee Complete 4 Views Right  Result Date: 10/21/2018 CLINICAL DATA:  Right knee pain after fall today. EXAM: RIGHT KNEE - COMPLETE 4+ VIEW COMPARISON:  None. FINDINGS: No evidence of fracture, dislocation, or joint effusion. Minimal narrowing of medial joint space is noted. Soft tissues are unremarkable. IMPRESSION: Minimal degenerative joint disease is noted medially. No acute abnormality seen in the right knee. Electronically Signed   By: Marijo Conception, M.D.   On: 10/21/2018 15:21   Dg Hip Unilat W Or Wo Pelvis 2-3 Views Right  Result Date: 10/21/2018 CLINICAL DATA:  Fall, right hip pain. EXAM: DG HIP (WITH OR WITHOUT PELVIS) 2-3V RIGHT COMPARISON:  None. FINDINGS: There is no evidence of hip fracture or dislocation. There is no evidence of arthropathy or other focal bone abnormality.  IMPRESSION: Negative. Electronically Signed   By: Rolm Baptise M.D.   On: 10/21/2018 15:31    Procedures Procedures (including critical care time)  Medications Ordered in ED Medications  ondansetron (ZOFRAN) injection 4 mg (4 mg Intravenous Given 10/21/18 1320)  HYDROmorphone (DILAUDID) injection 1 mg (1 mg Intravenous Given 10/21/18 1320)    Initial Impression / Assessment and Plan / ED Course  I have reviewed the triage vital signs and the nursing notes.  Pertinent labs & imaging results that were available during my care of the patient were reviewed by me and considered in my medical decision making (see chart for details).  53 year old female presents for evaluation after  mechanical fall.  Afebrile, nonseptic, non-ill-appearing.  Patient with pain to thoracic, lumbar, right hip right knee right ankle as well as head.  No shortening or rotation of legs.  Normal musculoskeletal exam except for right hip and right knee as patient is unwilling to perform ROS secondary to pain.  Intact sensation bilaterally to sharp and dull.  Normal neurologic exam. Will obtain plain films and reevaluate. Does not want anything additional for pain.  Received fentanyl by EMS.  1300: Patient with 2 episodes of emesis.  Will give Zofran and pain medication reevaluate.  Patient pending imaging.  1430: Emesis and pain controlled.  Patient pending imaging.  No complaints at this time. Labs at patient baseline.  Patient care transferred to [facility] [place] who will determine ultimate treatment, plan and disposition of patient.  Patient seen and evaluated by my attending, Dr. Venora Maples who agrees with above treatment, plan and disposition.     Final Clinical Impressions(s) / ED Diagnoses   Final diagnoses:  None    ED Discharge Orders    None       Regina Ganci A, PA-C 10/21/18 1534    Jola Schmidt, MD 10/21/18 [phone]

## 2018-10-21 NOTE — ED Notes (Signed)
Bed: WA03 Expected date:  Expected time:  Means of arrival:  Comments: 53 yo fall, hip pain

## 2018-10-31 ENCOUNTER — Ambulatory Visit (INDEPENDENT_AMBULATORY_CARE_PROVIDER_SITE_OTHER): Payer: BLUE CROSS/BLUE SHIELD | Admitting: Family Medicine

## 2018-10-31 VITALS — BP 150/72 | HR 70 | Temp 97.7°F | Ht 65.0 in | Wt 216.0 lb

## 2018-10-31 DIAGNOSIS — E66812 Obesity, class 2: Secondary | ICD-10-CM | POA: Insufficient documentation

## 2018-10-31 DIAGNOSIS — Z6835 Body mass index (BMI) 35.0-35.9, adult: Secondary | ICD-10-CM

## 2018-10-31 DIAGNOSIS — Z09 Encounter for follow-up examination after completed treatment for conditions other than malignant neoplasm: Secondary | ICD-10-CM

## 2018-10-31 DIAGNOSIS — Z131 Encounter for screening for diabetes mellitus: Secondary | ICD-10-CM

## 2018-10-31 DIAGNOSIS — R7303 Prediabetes: Secondary | ICD-10-CM

## 2018-10-31 DIAGNOSIS — I10 Essential (primary) hypertension: Secondary | ICD-10-CM | POA: Diagnosis not present

## 2018-10-31 LAB — POCT URINALYSIS DIP (MANUAL ENTRY)
Bilirubin, UA: NEGATIVE
Blood, UA: NEGATIVE
Glucose, UA: NEGATIVE mg/dL
Ketones, POC UA: NEGATIVE mg/dL
Leukocytes, UA: NEGATIVE
Nitrite, UA: NEGATIVE
Protein Ur, POC: NEGATIVE mg/dL
Spec Grav, UA: 1.02 (ref 1.010–1.025)
Urobilinogen, UA: 0.2 E.U./dL
pH, UA: 6.5 (ref 5.0–8.0)

## 2018-10-31 LAB — POCT GLYCOSYLATED HEMOGLOBIN (HGB A1C): Hemoglobin A1C: 5.7 % — AB (ref 4.0–5.6)

## 2018-10-31 MED ORDER — LOSARTAN POTASSIUM 25 MG PO TABS: 25.0000 mg | ORAL_TABLET | Freq: Every day | ORAL | 1 refills | Status: DC

## 2018-10-31 NOTE — Progress Notes (Signed)
Patient [facility] Follow Up  Subjective:  Patient ID: Jackie Reed, female    DOB: 07-Sep-1965  Age: 53 y.o. MRN: [id number]  CC:  Chief Complaint  Patient presents with  . Hypertension    HPI LASHAI GROSCH is a 53 year old female who presents for Hospital Follow Up today.   Past Medical History:  Diagnosis Date  . Hypertension   . Migraine   . Plantar fibromatosis   . PONV (postoperative nausea and vomiting)    Current Status: Since her last office visit, she has had a ED visit on 10/21/2018 for a recent fall at work. She has filed for workers compensation. She continues to have pain in her back and hip since her fall, which effects ambulation, standing, and sitting. She states that she had not taken Losartan for a few months now. She denies visual changes, chest pain, cough, shortness of breath, heart palpitations, and falls. She has occasional headaches and dizziness with position changes. Denies severe headaches, confusion, seizures, double vision, and blurred vision, nausea and vomiting. She had moderate anxiety r/t her weight.   She denies fevers, chills, fatigue, recent infections, weight loss, and night sweats. No reports of GI problems such as diarrhea, and constipation. She has no reports of blood in stools, dysuria and hematuria.  Past Surgical History:  Procedure Laterality Date  . ABDOMINAL HYSTERECTOMY    . FOOT SURGERY  1985  . MASS EXCISION Right 05/19/2018   Procedure: EXCISION PALMAR NODULES RIGHT HAND;  Surgeon: Leanora Cover, MD;  Location: [facility];  Service: Orthopedics;  Laterality: Right;  . PARTIAL HYSTERECTOMY  1997    Family History  Problem Relation Age of Onset  . Heart failure Mother   . Diabetes Mother   . Heart failure Father   . Hypertension Father   . Hyperlipidemia Father   . Colon cancer Neg Hx   . Colon polyps Neg Hx     Social History   Socioeconomic History  .  Marital status: Widowed    Spouse name: Not on file  . Number of children: Not on file  . Years of education: Not on file  . Highest education level: Not on file  Occupational History  . Not on file  Social Needs  . Financial resource strain: Not on file  . Food insecurity:    Worry: Not on file    Inability: Not on file  . Transportation needs:    Medical: Not on file    Non-medical: Not on file  Tobacco Use  . Smoking status: Never Smoker  . Smokeless tobacco: Never Used  Substance and Sexual Activity  . Alcohol use: No    Alcohol/week: 0.0 standard drinks  . Drug use: No  . Sexual activity: Yes    Birth control/protection: None  Lifestyle  . Physical activity:    Days per week: Not on file    Minutes per session: Not on file  . Stress: Not on file  Relationships  . Social connections:    Talks on phone: Not on file    Gets together: Not on file    Attends religious service: Not on file    Active member of club or organization: Not on file    Attends meetings of clubs or organizations: Not on file    Relationship status: Not on file  . Intimate partner violence:    Fear of current or ex partner:  Not on file    Emotionally abused: Not on file    Physically abused: Not on file    Forced sexual activity: Not on file  Other Topics Concern  . Not on file  Social History Narrative  . Not on file    Outpatient Medications Prior to Visit  Medication Sig Dispense Refill  . HYDROcodone-acetaminophen (NORCO) 5-325 MG tablet 1-2 tabs po q6 hours prn pain (Patient not taking: Reported on 07/26/2018) 20 tablet 0  . ondansetron (ZOFRAN ODT) 4 MG disintegrating tablet Take 1 tablet (4 mg total) by mouth every 8 (eight) hours as needed for nausea or vomiting. (Patient not taking: Reported on 10/31/2018) 3 tablet 0  . Vitamin D, Ergocalciferol, (DRISDOL) 50000 units CAPS capsule Take 1 capsule (50,000 Units total) by mouth every 7 (seven) days. (Patient not taking: Reported on  10/21/2018) 30 capsule 2  . azithromycin (ZITHROMAX) 250 MG tablet Take as directed on z pack (Patient not taking: Reported on 10/21/2018) 6 tablet 0  . losartan (COZAAR) 25 MG tablet Take 1 tablet (25 mg total) by mouth daily. (Patient not taking: Reported on 10/21/2018) 90 tablet 1  . methylPREDNISolone (MEDROL DOSEPAK) 4 MG TBPK tablet Take as directed on pack (Patient not taking: Reported on 10/21/2018) 21 tablet 0  . promethazine-dextromethorphan (PROMETHAZINE-DM) 6.25-15 MG/5ML syrup Take 5 mLs by mouth 4 (four) times daily as needed for cough. (Patient not taking: Reported on 10/21/2018) 118 mL 0   No facility-administered medications prior to visit.     Allergies  Allergen Reactions  . Morphine And Related Rash    At injection site  . Tramadol Nausea And Vomiting    ROS Review of Systems  Constitutional: Negative.   HENT: Negative.   Eyes: Negative.   Respiratory: Negative.   Cardiovascular: Negative.   Gastrointestinal: Positive for abdominal distention (obese).  Endocrine: Negative.   Genitourinary: Negative.   Musculoskeletal: Positive for arthralgias (hip pain; r/t recent fall) and back pain (r/t recent fall).  Skin: Negative.   Allergic/Immunologic: Negative.   Neurological: Positive for dizziness and headaches.  Psychiatric/Behavioral: Negative.      Objective:   Physical Exam  Constitutional: She is oriented to person, place, and time. She appears well-developed and well-nourished.  HENT:  Head: Normocephalic and atraumatic.  Eyes: Conjunctivae are normal.  Neck: Normal range of motion. Neck supple.  Cardiovascular: Normal rate, regular rhythm, normal heart sounds and intact distal pulses.  Pulmonary/Chest: Effort normal and breath sounds normal.  Abdominal: Soft. Bowel sounds are normal.  Musculoskeletal: Normal range of motion.  Neurological: She is alert and oriented to person, place, and time. She has normal reflexes.  Skin: Skin is warm and dry.    Psychiatric: She has a normal mood and affect. Her behavior is normal. Judgment and thought content normal.  Nursing note and vitals reviewed.  BP (!) 150/72   Pulse 70   Temp 97.7 F (36.5 C) (Oral)   Ht 5\' 5"  (1.651 m)   Wt 216 lb (98 kg)   SpO2 100%   BMI 35.94 kg/m  Wt Readings from Last 3 Encounters:  10/31/18 216 lb (98 kg)  10/21/18 215 lb (97.5 kg)  07/26/18 215 lb 9.6 oz (97.8 kg)   Health Maintenance Due  Topic Date Due  . HIV Screening  03/15/1980  . TETANUS/TDAP  03/15/1984    There are no preventive care reminders to display for this patient.  Lab Results  Component Value Date   TSH 1.070 03/25/2018  Lab Results  Component Value Date   WBC 9.4 10/21/2018   HGB 11.5 (L) 10/21/2018   HCT 38.0 10/21/2018   MCV 73.5 (L) 10/21/2018   PLT 351 10/21/2018   Lab Results  Component Value Date   NA 137 10/21/2018   K 3.2 (L) 10/21/2018   CO2 18 (L) 10/21/2018   GLUCOSE 178 (H) 10/21/2018   BUN 15 10/21/2018   CREATININE 0.63 10/21/2018   BILITOT 0.6 10/21/2018   ALKPHOS 154 (H) 10/21/2018   AST 19 10/21/2018   ALT 12 10/21/2018   PROT 7.9 10/21/2018   ALBUMIN 4.0 10/21/2018   CALCIUM 9.0 10/21/2018   ANIONGAP 11 10/21/2018   Lab Results  Component Value Date   CHOL 161 03/25/2018   Lab Results  Component Value Date   HDL 47 03/25/2018   Lab Results  Component Value Date   LDLCALC 76 03/25/2018   Lab Results  Component Value Date   TRIG 192 (H) 03/25/2018   Lab Results  Component Value Date   CHOLHDL 3.4 03/25/2018   Lab Results  Component Value Date   HGBA1C 5.7 (A) 10/31/2018   Assessment & Plan:   1. Screening for diabetes mellitus Hgb A1c is stable at 5.7 today. She will continue to decrease foods/beverages high in sugars and carbs and follow Heart Healthy or DASH diet. Increase physical activity to at least 30 minutes cardio exercise daily.  - POCT glycosylated hemoglobin (Hb A1C) - POCT urinalysis dipstick  2. Essential  hypertension Blood pressures are stable 150/72. She will re-start taking Losartan today as prescribed. She will continue to decrease high sodium intake, excessive alcohol intake, increase potassium intake, smoking cessation, and increase physical activity of at least 30 minutes of cardio activity daily. She will continue to follow Heart Healthy or DASH diet.  - losartan (COZAAR) 25 MG tablet; Take 1 tablet (25 mg total) by mouth daily.  Dispense: 90 tablet; Refill: 1  3. Class 2 severe obesity due to excess calories with serious comorbidity and body mass index (BMI) of 35.0 to 35.9 in adult [facility]) Body mass index is 35.94 kg/m. We will refer her to Nutritionist today. Goal BMI  is <30. Encouraged efforts to reduce weight include engaging in physical activity as tolerated with goal of 150 minutes per week. Improve dietary choices and eat a meal regimen consistent with a Mediterranean or DASH diet. Reduce simple carbohydrates. Do not skip meals and eat healthy snacks throughout the day to avoid over-eating at dinner. Set a goal weight loss that is achievable for you. - Amb ref to Medical Nutrition Therapy-MNT  4. Screening for diabetes mellitus Hgb A1c is at 5.7 today. We will continue to monitor.   5. Pre-diabetes  6. Follow up She will follow up in 1 month.   Meds ordered this encounter  Medications  . losartan (COZAAR) 25 MG tablet    Sig: Take 1 tablet (25 mg total) by mouth daily.    Dispense:  90 tablet    Refill:  1    Orders Placed This Encounter  Procedures  . Amb ref to Medical Nutrition Therapy-MNT  . POCT glycosylated hemoglobin (Hb A1C)  . POCT urinalysis dipstick     Referral Orders     Amb ref to [place],  MSN, FNP-C [facility] [place], [place] [address] [phone]    Problem List Items Addressed This Visit    None  Visit Diagnoses    Hospital discharge follow-up     -  Primary   Essential hypertension       Relevant Medications   losartan (COZAAR) 25 MG tablet   Class 2 severe obesity due to excess calories with serious comorbidity and body mass index (BMI) of 35.0 to 35.9 in adult [facility])       Relevant Orders   Amb ref to Medical Nutrition Therapy-MNT   Screening for diabetes mellitus       Relevant Orders   POCT glycosylated hemoglobin (Hb A1C) (Completed)   POCT urinalysis dipstick (Completed)   Pre-diabetes       Follow up          Meds ordered this encounter  Medications  . losartan (COZAAR) 25 MG tablet    Sig: Take 1 tablet (25 mg total) by mouth daily.    Dispense:  90 tablet    Refill:  1    Follow-up: Return in about 1 month (around 12/01/2018).    Azzie Glatter, FNP

## 2018-10-31 NOTE — Patient Instructions (Signed)
Calorie Counting for Weight Loss Calories are units of energy. Your body needs a certain amount of calories from food to keep you going throughout the day. When you eat more calories than your body needs, your body stores the extra calories as fat. When you eat fewer calories than your body needs, your body burns fat to get the energy it needs. Calorie counting means keeping track of how many calories you eat and drink each day. Calorie counting can be helpful if you need to lose weight. If you make sure to eat fewer calories than your body needs, you should lose weight. Ask your health care provider what a healthy weight is for you. For calorie counting to work, you will need to eat the right number of calories in a day in order to lose a healthy amount of weight per week. A dietitian can help you determine how many calories you need in a day and will give you suggestions on how to reach your calorie goal.  A healthy amount of weight to lose per week is usually 1-2 lb (0.5-0.9 kg). This usually means that your daily calorie intake should be reduced by 500-750 calories.  Eating 1,200 - 1,500 calories per day can help most women lose weight.  Eating 1,500 - 1,800 calories per day can help most men lose weight. What is my plan? My goal is to have __________ calories per day. If I have this many calories per day, I should lose around __________ pounds per week. What do I need to know about calorie counting? In order to meet your daily calorie goal, you will need to:  Find out how many calories are in each food you would like to eat. Try to do this before you eat.  Decide how much of the food you plan to eat.  Write down what you ate and how many calories it had. Doing this is called keeping a food log. To successfully lose weight, it is important to balance calorie counting with a healthy lifestyle that includes regular activity. Aim for 150 minutes of moderate exercise (such as walking) or 75  minutes of vigorous exercise (such as running) each week. Where do I find calorie information?  The number of calories in a food can be found on a Nutrition Facts label. If a food does not have a Nutrition Facts label, try to look up the calories online or ask your dietitian for help. Remember that calories are listed per serving. If you choose to have more than one serving of a food, you will have to multiply the calories per serving by the amount of servings you plan to eat. For example, the label on a package of bread might say that a serving size is 1 slice and that there are 90 calories in a serving. If you eat 1 slice, you will have eaten 90 calories. If you eat 2 slices, you will have eaten 180 calories. How do I keep a food log? Immediately after each meal, record the following information in your food log:  What you ate. Don't forget to include toppings, sauces, and other extras on the food.  How much you ate. This can be measured in cups, ounces, or number of items.  How many calories each food and drink had.  The total number of calories in the meal. Keep your food log near you, such as in a small notebook in your pocket, or use a mobile app or website. Some programs will calculate   calories for you and show you how many calories you have left for the day to meet your goal. What are some calorie counting tips?   Use your calories on foods and drinks that will fill you up and not leave you hungry: ? Some examples of foods that fill you up are nuts and nut butters, vegetables, lean proteins, and high-fiber foods like whole grains. High-fiber foods are foods with more than 5 g fiber per serving. ? Drinks such as sodas, specialty coffee drinks, alcohol, and juices have a lot of calories, yet do not fill you up.  Eat nutritious foods and avoid empty calories. Empty calories are calories you get from foods or beverages that do not have many vitamins or protein, such as candy, sweets, and  soda. It is better to have a nutritious high-calorie food (such as an avocado) than a food with few nutrients (such as a bag of chips).  Know how many calories are in the foods you eat most often. This will help you calculate calorie counts faster.  Pay attention to calories in drinks. Low-calorie drinks include water and unsweetened drinks.  Pay attention to nutrition labels for "low fat" or "fat free" foods. These foods sometimes have the same amount of calories or more calories than the full fat versions. They also often have added sugar, starch, or salt, to make up for flavor that was removed with the fat.  Find a way of tracking calories that works for you. Get creative. Try different apps or programs if writing down calories does not work for you. What are some portion control tips?  Know how many calories are in a serving. This will help you know how many servings of a certain food you can have.  Use a measuring cup to measure serving sizes. You could also try weighing out portions on a kitchen scale. With time, you will be able to estimate serving sizes for some foods.  Take some time to put servings of different foods on your favorite plates, bowls, and cups so you know what a serving looks like.  Try not to eat straight from a bag or box. Doing this can lead to overeating. Put the amount you would like to eat in a cup or on a plate to make sure you are eating the right portion.  Use smaller plates, glasses, and bowls to prevent overeating.  Try not to multitask (for example, watch TV or use your computer) while eating. If it is time to eat, sit down at a table and enjoy your food. This will help you to know when you are full. It will also help you to be aware of what you are eating and how much you are eating. What are tips for following this plan? Reading food labels  Check the calorie count compared to the serving size. The serving size may be smaller than what you are used to  eating.  Check the source of the calories. Make sure the food you are eating is high in vitamins and protein and low in saturated and trans fats. Shopping  Read nutrition labels while you shop. This will help you make healthy decisions before you decide to purchase your food.  Make a grocery list and stick to it. Cooking  Try to cook your favorite foods in a healthier way. For example, try baking instead of frying.  Use low-fat dairy products. Meal planning  Use more fruits and vegetables. Half of your plate should be fruits   and vegetables.  Include lean proteins like poultry and fish. How do I count calories when eating out?  Ask for smaller portion sizes.  Consider sharing an entree and sides instead of getting your own entree.  If you get your own entree, eat only half. Ask for a box at the beginning of your meal and put the rest of your entree in it so you are not tempted to eat it.  If calories are listed on the menu, choose the lower calorie options.  Choose dishes that include vegetables, fruits, whole grains, low-fat dairy products, and lean protein.  Choose items that are boiled, broiled, grilled, or steamed. Stay away from items that are buttered, battered, fried, or served with cream sauce. Items labeled "crispy" are usually fried, unless stated otherwise.  Choose water, low-fat milk, unsweetened iced tea, or other drinks without added sugar. If you want an alcoholic beverage, choose a lower calorie option such as a glass of wine or light beer.  Ask for dressings, sauces, and syrups on the side. These are usually high in calories, so you should limit the amount you eat.  If you want a salad, choose a garden salad and ask for grilled meats. Avoid extra toppings like bacon, cheese, or fried items. Ask for the dressing on the side, or ask for olive oil and vinegar or lemon to use as dressing.  Estimate how many servings of a food you are given. For example, a serving of  cooked rice is  cup or about the size of half a baseball. Knowing serving sizes will help you be aware of how much food you are eating at restaurants. The list below tells you how big or small some common portion sizes are based on everyday objects: ? 1 oz-4 stacked dice. ? 3 oz-1 deck of cards. ? 1 tsp-1 die. ? 1 Tbsp- a ping-pong ball. ? 2 Tbsp-1 ping-pong ball. ?  cup- baseball. ? 1 cup-1 baseball. Summary  Calorie counting means keeping track of how many calories you eat and drink each day. If you eat fewer calories than your body needs, you should lose weight.  A healthy amount of weight to lose per week is usually 1-2 lb (0.5-0.9 kg). This usually means reducing your daily calorie intake by 500-750 calories.  The number of calories in a food can be found on a Nutrition Facts label. If a food does not have a Nutrition Facts label, try to look up the calories online or ask your dietitian for help.  Use your calories on foods and drinks that will fill you up, and not on foods and drinks that will leave you hungry.  Use smaller plates, glasses, and bowls to prevent overeating. This information is not intended to replace advice given to you by your health care provider. Make sure you discuss any questions you have with your health care provider. Document Released: 08/10/2005 Document Revised: 04/29/2018 Document Reviewed: 07/10/2016 Elsevier Interactive Patient Education  2019 [place] DASH stands for "Dietary Approaches to Stop Hypertension." The DASH eating plan is a healthy eating plan that has been shown to reduce high blood pressure (hypertension). It may also reduce your risk for type 2 diabetes, heart disease, and stroke. The DASH eating plan may also help with weight loss. What are tips for following this plan?  General guidelines  Avoid eating more than 2,300 mg (milligrams) of salt (sodium) a day. If you have hypertension, you may need to reduce your  sodium intake to 1,500 mg a day.  Limit alcohol intake to no more than 1 drink a day for nonpregnant women and 2 drinks a day for men. One drink equals 12 oz of beer, 5 oz of wine, or 1 oz of hard liquor.  Work with your health care provider to maintain a healthy body weight or to lose weight. Ask what an ideal weight is for you.  Get at least 30 minutes of exercise that causes your heart to beat faster (aerobic exercise) most days of the week. Activities may include walking, swimming, or biking.  Work with your health care provider or diet and nutrition specialist (dietitian) to adjust your eating plan to your individual calorie needs. Reading food labels   Check food labels for the amount of sodium per serving. Choose foods with less than 5 percent of the Daily Value of sodium. Generally, foods with less than 300 mg of sodium per serving fit into this eating plan.  To find whole grains, look for the word "whole" as the first word in the ingredient list. Shopping  Buy products labeled as "low-sodium" or "no salt added."  Buy fresh foods. Avoid canned foods and premade or frozen meals. Cooking  Avoid adding salt when cooking. Use salt-free seasonings or herbs instead of table salt or sea salt. Check with your health care provider or pharmacist before using salt substitutes.  Do not fry foods. Cook foods using healthy methods such as baking, boiling, grilling, and broiling instead.  Cook with heart-healthy oils, such as olive, canola, soybean, or sunflower oil. Meal planning  Eat a balanced diet that includes: ? 5 or more servings of fruits and vegetables each day. At each meal, try to fill half of your plate with fruits and vegetables. ? Up to 6-8 servings of whole grains each day. ? Less than 6 oz of lean meat, poultry, or fish each day. A 3-oz serving of meat is about the same size as a deck of cards. One egg equals 1 oz. ? 2 servings of low-fat dairy each day. ? A serving of  nuts, seeds, or beans 5 times each week. ? Heart-healthy fats. Healthy fats called Omega-3 fatty acids are found in foods such as flaxseeds and coldwater fish, like sardines, salmon, and mackerel.  Limit how much you eat of the following: ? Canned or prepackaged foods. ? Food that is high in trans fat, such as fried foods. ? Food that is high in saturated fat, such as fatty meat. ? Sweets, desserts, sugary drinks, and other foods with added sugar. ? Full-fat dairy products.  Do not salt foods before eating.  Try to eat at least 2 vegetarian meals each week.  Eat more home-cooked food and less restaurant, buffet, and fast food.  When eating at a restaurant, ask that your food be prepared with less salt or no salt, if possible. What foods are recommended? The items listed may not be a complete list. Talk with your dietitian about what dietary choices are best for you. Grains Whole-grain or whole-wheat bread. Whole-grain or whole-wheat pasta. Brown rice. [place]. Bulgur. Whole-grain and low-sodium cereals. Pita bread. Low-fat, low-sodium crackers. Whole-wheat flour tortillas. Vegetables Fresh or frozen vegetables (raw, steamed, roasted, or grilled). Low-sodium or reduced-sodium tomato and vegetable juice. Low-sodium or reduced-sodium tomato sauce and tomato paste. Low-sodium or reduced-sodium canned vegetables. Fruits All fresh, dried, or frozen fruit. Canned fruit in natural juice (without added sugar). Meat and other protein foods Skinless chicken or [place].  Ground chicken or [place]. Pork with fat trimmed off. Fish and seafood. Egg whites. Dried beans, peas, or lentils. Unsalted nuts, nut butters, and seeds. Unsalted canned beans. Lean cuts of beef with fat trimmed off. Low-sodium, lean deli meat. Dairy Low-fat (1%) or fat-free (skim) milk. Fat-free, low-fat, or reduced-fat cheeses. Nonfat, low-sodium ricotta or cottage cheese. Low-fat or nonfat yogurt. Low-fat, low-sodium  cheese. Fats and oils Soft margarine without trans fats. Vegetable oil. Low-fat, reduced-fat, or light mayonnaise and salad dressings (reduced-sodium). Canola, safflower, olive, soybean, and sunflower oils. Avocado. Seasoning and other foods Herbs. Spices. Seasoning mixes without salt. Unsalted popcorn and pretzels. Fat-free sweets. What foods are not recommended? The items listed may not be a complete list. Talk with your dietitian about what dietary choices are best for you. Grains Baked goods made with fat, such as croissants, muffins, or some breads. Dry pasta or rice meal packs. Vegetables Creamed or fried vegetables. Vegetables in a cheese sauce. Regular canned vegetables (not low-sodium or reduced-sodium). Regular canned tomato sauce and paste (not low-sodium or reduced-sodium). Regular tomato and vegetable juice (not low-sodium or reduced-sodium). Angie Fava. Olives. Fruits Canned fruit in a light or heavy syrup. Fried fruit. Fruit in cream or butter sauce. Meat and other protein foods Fatty cuts of meat. Ribs. Fried meat. Berniece Salines. Sausage. Bologna and other processed lunch meats. Salami. Fatback. Hotdogs. Bratwurst. Salted nuts and seeds. Canned beans with added salt. Canned or smoked fish. Whole eggs or egg yolks. Chicken or [place] with skin. Dairy Whole or 2% milk, cream, and half-and-half. Whole or full-fat cream cheese. Whole-fat or sweetened yogurt. Full-fat cheese. Nondairy creamers. Whipped toppings. Processed cheese and cheese spreads. Fats and oils Butter. Stick margarine. Lard. Shortening. Ghee. Bacon fat. Tropical oils, such as coconut, palm kernel, or palm oil. Seasoning and other foods Salted popcorn and pretzels. Onion salt, garlic salt, seasoned salt, table salt, and sea salt. Worcestershire sauce. Tartar sauce. Barbecue sauce. Teriyaki sauce. Soy sauce, including reduced-sodium. Steak sauce. Canned and packaged gravies. Fish sauce. Oyster sauce. Cocktail sauce. Horseradish  that you find on the shelf. Ketchup. Mustard. Meat flavorings and tenderizers. Bouillon cubes. Hot sauce and Tabasco sauce. Premade or packaged marinades. Premade or packaged taco seasonings. Relishes. Regular salad dressings. Where to find more information:  National Heart, Lung, and [place]: https://wilson-eaton.com/  American Heart Association: www.heart.org Summary  The DASH eating plan is a healthy eating plan that has been shown to reduce high blood pressure (hypertension). It may also reduce your risk for type 2 diabetes, heart disease, and stroke.  With the DASH eating plan, you should limit salt (sodium) intake to 2,300 mg a day. If you have hypertension, you may need to reduce your sodium intake to 1,500 mg a day.  When on the DASH eating plan, aim to eat more fresh fruits and vegetables, whole grains, lean proteins, low-fat dairy, and heart-healthy fats.  Work with your health care provider or diet and nutrition specialist (dietitian) to adjust your eating plan to your individual calorie needs. This information is not intended to replace advice given to you by your health care provider. Make sure you discuss any questions you have with your health care provider. Document Released: 07/30/2011 Document Revised: 08/03/2016 Document Reviewed: 08/03/2016 Elsevier Interactive Patient Education  2019 [employer].

## 2018-11-01 ENCOUNTER — Encounter: Payer: Self-pay | Admitting: Family Medicine

## 2018-11-10 ENCOUNTER — Other Ambulatory Visit: Payer: Self-pay

## 2018-11-10 ENCOUNTER — Encounter: Payer: Self-pay | Admitting: Registered"

## 2018-11-10 ENCOUNTER — Encounter: Payer: BLUE CROSS/BLUE SHIELD | Attending: Family Medicine | Admitting: Registered"

## 2018-11-10 DIAGNOSIS — E669 Obesity, unspecified: Secondary | ICD-10-CM | POA: Insufficient documentation

## 2018-11-10 NOTE — Patient Instructions (Addendum)
-   Aim to have non-starchy vegetables with lunch and dinner.   - Aim to decrease sweet tea intake to 1/day and replace other drink option with water + flavor pack.   - Have 3 meals a day. Include breakfast daily of at least some carbohydrates + protein.   - Aim to participate in joyful movement activities for 30 minutes at least 3 times a week.

## 2018-11-10 NOTE — Progress Notes (Signed)
Medical Nutrition Therapy:  Appt start time: 11:10 end time: 12:15   Assessment:  Primary concerns today: Pt states she is struggling with weight. Recent labs A1C 5.7.   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   Pt reports diet history and restricting. Pt states she thinks its something she is not doing right. Pt states she drinks a lot of sodas and sweet tea. Reports drinking 2 extra sweet Pure Lead teas a day along with soda. Prefers [facility] as soda option. Pt states she drinks flavored water sometimes;  does not prefer plain water.   Pt states she does not cook often and not a breakfast person. Pt reports no longer adding sugar to coffee. Pt states she knows how to cook: beans, vegetables, etc when she is in the mood. Feels that it is easier for her to grab something to eat rather than cooking for 1 person.   Reports knowing that she uses food to deal with stress and feelings. Pt states she does not want to be on blood pressure medication forever and does not have much energy. Pt states she has not been motivated to exercise. Reports walking and gym as previous experiences. Likes to [employer], dance aerobics, and shallow-water water aerobics.     Preferred Learning Style:   No preference indicated   Learning Readiness:   Ready  Change in progress   MEDICATIONS: See list   DIETARY INTAKE:  Usual eating pattern includes 2 meals and 2-3 snacks per day.  Everyday foods include fast food, wings, chips, soda, sweet tea.  Avoided foods include asparagus, "all the healthy stuff", fish, broccoli.     24-hr recall:  B ( AM): typically skips; coffee with creamer Snk ( AM): peanut butter crackers or chips L ( PM): fast food-burgers + fries or Stephanie[employer]-fried chicken wings + mac and cheese + yams + cornbread or hot dog + fries Snk ( PM): none D ( PM): fast food-burger +  fries/baked beans or restaurant-wings + ranch dressing or spaghetti + garlic bread + salad (sometimes)  Snk ( PM): chips + soda or cookies or cake or pie Beverages: soda, sweet tea, coffee  Usual physical activity: none stated  Estimated energy needs: 1600-1800 calories 180-200 g carbohydrates 120-135 g protein 44-50 g fat  Progress Towards Goal(s):  In progress.   Nutritional Diagnosis:  NB-1.1 Food and nutrition-related knowledge deficit As related to lack of prior nutrition related education.  As evidenced by verbalizes incomplete information.    Intervention:  Nutrition education and counseling. Pt was educated and counseled on eating to nourish body, prediabetes, having balanced meals and its components, importance of fiber, and importance of physical activity. Pt was also educated on the importance of adequately fueling body throughout the day and counseled on ways to decrease sugar-sweetened beverage intake and increasing water intake.  Pt was in agreement with goals listed.  Goals: - Aim to have non-starchy vegetables with lunch and dinner.  - Aim to decrease sweet tea intake to 1/day and replace other drink option with water + flavor pack.  - Have 3 meals a day. Include breakfast daily of at least some carbohydrates + protein.  - Aim to participate in joyful movement activities for 30 minutes at least 3 times a week.   Teaching Method Utilized:  Visual Auditory Hands on  Handouts given during visit include:  My Plate  Barriers to learning/adherence to lifestyle change: contemplative stage of change  Demonstrated degree of understanding via:  Teach Back   Monitoring/Evaluation:  Dietary intake, exercise, and body weight prn. Pt states she can follow the plan on her own and will call for follow-up appt if needed.

## 2018-11-18 DIAGNOSIS — S93401A Sprain of unspecified ligament of right ankle, initial encounter: Secondary | ICD-10-CM | POA: Insufficient documentation

## 2018-11-30 ENCOUNTER — Ambulatory Visit: Payer: Self-pay | Admitting: Family Medicine

## 2018-12-02 ENCOUNTER — Ambulatory Visit: Payer: Self-pay | Admitting: Family Medicine

## 2018-12-30 ENCOUNTER — Encounter: Payer: Self-pay | Admitting: Family Medicine

## 2018-12-30 ENCOUNTER — Ambulatory Visit (INDEPENDENT_AMBULATORY_CARE_PROVIDER_SITE_OTHER): Payer: BLUE CROSS/BLUE SHIELD | Admitting: Family Medicine

## 2018-12-30 ENCOUNTER — Other Ambulatory Visit: Payer: Self-pay

## 2018-12-30 VITALS — BP 142/78 | HR 68 | Ht 65.0 in | Wt 217.0 lb

## 2018-12-30 DIAGNOSIS — Z09 Encounter for follow-up examination after completed treatment for conditions other than malignant neoplasm: Secondary | ICD-10-CM

## 2018-12-30 DIAGNOSIS — I10 Essential (primary) hypertension: Secondary | ICD-10-CM

## 2018-12-30 DIAGNOSIS — Z9181 History of falling: Secondary | ICD-10-CM | POA: Diagnosis not present

## 2018-12-30 DIAGNOSIS — E66812 Obesity, class 2: Secondary | ICD-10-CM

## 2018-12-30 DIAGNOSIS — Z6835 Body mass index (BMI) 35.0-35.9, adult: Secondary | ICD-10-CM

## 2018-12-30 LAB — POCT URINALYSIS DIP (MANUAL ENTRY)
Bilirubin, UA: NEGATIVE
Blood, UA: NEGATIVE
Glucose, UA: NEGATIVE mg/dL
Ketones, POC UA: NEGATIVE mg/dL
Leukocytes, UA: NEGATIVE
Nitrite, UA: NEGATIVE
Protein Ur, POC: NEGATIVE mg/dL
Spec Grav, UA: 1.025 (ref 1.010–1.025)
Urobilinogen, UA: 2 E.U./dL — AB
pH, UA: 6 (ref 5.0–8.0)

## 2018-12-30 NOTE — Progress Notes (Signed)
Patient [facility]   Established Patient Office Visit  Subjective:  Patient ID: Jackie Reed, female    DOB: August 18, 1966  Age: 53 y.o. MRN: [id number]  CC:  Chief Complaint  Patient presents with  . Follow-up    chronic condition    HPI Jackie Reed is a 53 year old female who presents for follow up today.   Past Medical History:  Diagnosis Date  . Hypertension   . Migraine   . Plantar fibromatosis   . PONV (postoperative nausea and vomiting)    Current Status: Since her last office visit, she is doing well with no complaints. He denies visual changes, chest pain, cough, shortness of breath, heart palpitations, and falls. He has occasional headaches and dizziness with position changes. Denies severe headaches, confusion, seizures, double vision, and blurred vision, nausea and vomiting. Her migraines are stable lately. She is currently is in PT twice a week, which she began since she was approved for [employer]. She continues to have hip and back pain r/t recent fall at work.    She denies fevers, chills, fatigue, recent infections, weight loss, and night sweats. No reports of GI problems such as diarrhea, and constipation. She has no reports of blood in stools, dysuria and hematuria. No depression or anxiety reported.   Past Surgical History:  Procedure Laterality Date  . ABDOMINAL HYSTERECTOMY    . FOOT SURGERY  1985  . MASS EXCISION Right 05/19/2018   Procedure: EXCISION PALMAR NODULES RIGHT HAND;  Surgeon: Leanora Cover, MD;  Location: [facility];  Service: Orthopedics;  Laterality: Right;  . PARTIAL HYSTERECTOMY  1997    Family History  Problem Relation Age of Onset  . Heart failure Mother   . Diabetes Mother   . Heart failure Father   . Hypertension Father   . Hyperlipidemia Father   . Colon cancer Neg Hx   . Colon polyps Neg Hx     Social History   Socioeconomic History  . Marital status: Widowed    Spouse name: Not on file  . Number of children: Not on file  . Years of education: Not on file  . Highest education level: Not on file  Occupational History  . Not on file  Social Needs  . Financial resource strain: Not on file  . Food insecurity:    Worry: Not on file    Inability: Not on file  . Transportation needs:    Medical: Not on file    Non-medical: Not on file  Tobacco Use  . Smoking status: Never Smoker  . Smokeless tobacco: Never Used  Substance and Sexual Activity  . Alcohol use: No    Alcohol/week: 0.0 standard drinks  . Drug use: No  . Sexual activity: Yes    Birth control/protection: None  Lifestyle  . Physical activity:    Days per week: Not on file    Minutes per session: Not on file  . Stress: Not on file  Relationships  . Social connections:    Talks on phone: Not on file    Gets together: Not on file    Attends religious service: Not on file    Active member of club or organization: Not on file    Attends meetings of clubs or organizations: Not on file    Relationship status: Not on file  . Intimate partner violence:    Fear of current or ex partner: Not on  file    Emotionally abused: Not on file    Physically abused: Not on file    Forced sexual activity: Not on file  Other Topics Concern  . Not on file  Social History Narrative  . Not on file    Outpatient Medications Prior to Visit  Medication Sig Dispense Refill  . losartan (COZAAR) 25 MG tablet Take 1 tablet (25 mg total) by mouth daily. 90 tablet 1  . HYDROcodone-acetaminophen (NORCO) 5-325 MG tablet 1-2 tabs po q6 hours prn pain (Patient not taking: Reported on 07/26/2018) 20 tablet 0  . ondansetron (ZOFRAN ODT) 4 MG disintegrating tablet Take 1 tablet (4 mg total) by mouth every 8 (eight) hours as needed for nausea or vomiting. (Patient not taking: Reported on 10/31/2018) 3 tablet 0  . Vitamin D, Ergocalciferol, (DRISDOL) 50000 units CAPS capsule Take 1 capsule (50,000 Units total) by  mouth every 7 (seven) days. (Patient not taking: Reported on 10/21/2018) 30 capsule 2   No facility-administered medications prior to visit.     Allergies  Allergen Reactions  . Morphine And Related Rash    At injection site  . Tramadol Nausea And Vomiting    ROS Review of Systems  Constitutional: Negative.   HENT: Negative.   Eyes: Negative.   Respiratory: Negative.   Cardiovascular: Negative.   Gastrointestinal: Positive for abdominal distention (Obese).  Endocrine: Negative.   Genitourinary: Negative.   Musculoskeletal: Positive for arthralgias (generalized).  Skin: Negative.   Allergic/Immunologic: Negative.   Neurological: Positive for dizziness and headaches.  Hematological: Negative.   Psychiatric/Behavioral: Negative.     Objective:    Physical Exam  Constitutional: She is oriented to person, place, and time. She appears well-developed and well-nourished.  HENT:  Head: Normocephalic and atraumatic.  Eyes: Conjunctivae are normal.  Neck: Normal range of motion. Neck supple.  Cardiovascular: Normal rate, regular rhythm, normal heart sounds and intact distal pulses.  Pulmonary/Chest: Effort normal and breath sounds normal.  Abdominal: Soft. Bowel sounds are normal.  Musculoskeletal: Normal range of motion.  Neurological: She is alert and oriented to person, place, and time. She has normal reflexes.  Skin: Skin is warm and dry.  Psychiatric: She has a normal mood and affect. Her behavior is normal. Judgment and thought content normal.  Nursing note and vitals reviewed.   BP (!) 142/78 (BP Location: Right Arm, Patient Position: Sitting, Cuff Size: Large)   Pulse 68   Ht 5\' 5"  (1.651 m)   Wt 217 lb (98.4 kg)   SpO2 100%   BMI 36.11 kg/m  Wt Readings from Last 3 Encounters:  12/30/18 217 lb (98.4 kg)  10/31/18 216 lb (98 kg)  10/21/18 215 lb (97.5 kg)     Health Maintenance Due  Topic Date Due  . HIV Screening  03/15/1980  . TETANUS/TDAP  03/15/1984     There are no preventive care reminders to display for this patient.  Lab Results  Component Value Date   TSH 1.070 03/25/2018   Lab Results  Component Value Date   WBC 9.4 10/21/2018   HGB 11.5 (L) 10/21/2018   HCT 38.0 10/21/2018   MCV 73.5 (L) 10/21/2018   PLT 351 10/21/2018   Lab Results  Component Value Date   NA 137 10/21/2018   K 3.2 (L) 10/21/2018   CO2 18 (L) 10/21/2018   GLUCOSE 178 (H) 10/21/2018   BUN 15 10/21/2018   CREATININE 0.63 10/21/2018   BILITOT 0.6 10/21/2018   ALKPHOS 154 (H) 10/21/2018  AST 19 10/21/2018   ALT 12 10/21/2018   PROT 7.9 10/21/2018   ALBUMIN 4.0 10/21/2018   CALCIUM 9.0 10/21/2018   ANIONGAP 11 10/21/2018   Lab Results  Component Value Date   CHOL 161 03/25/2018   Lab Results  Component Value Date   HDL 47 03/25/2018   Lab Results  Component Value Date   LDLCALC 76 03/25/2018   Lab Results  Component Value Date   TRIG 192 (H) 03/25/2018   Lab Results  Component Value Date   CHOLHDL 3.4 03/25/2018   Lab Results  Component Value Date   HGBA1C 5.7 (A) 10/31/2018      Assessment & Plan:   1. Essential hypertension Blood pressure is stable at 142/78 today. Continue  Medications as prescribed. She will continue to decrease high sodium intake, excessive alcohol intake, increase potassium intake, smoking cessation, and increase physical activity of at least 30 minutes of cardio activity daily. She will continue to follow Heart Healthy or DASH diet. - POCT urinalysis dipstick  2. Class 2 severe obesity due to excess calories with serious comorbidity and body mass index (BMI) of 35.0 to 35.9 in adult [facility]) Body mass index is 36.11 kg/m. Goal BMI  is <30. Encouraged efforts to reduce weight include engaging in physical activity as tolerated with goal of 150 minutes per week. Improve dietary choices and eat a meal regimen consistent with a Mediterranean or DASH diet. Reduce simple carbohydrates. Do not skip meals and eat  healthy snacks throughout the day to avoid over-eating at dinner. Set a goal weight loss that is achievable for you.  3. History of recent fall Stable. Fall on 10/31/2018 from work. Continue PT as needed.   4. Follow up She will follow up in 6 months.   No orders of the defined types were placed in this encounter.   Orders Placed This Encounter  Procedures  . POCT urinalysis dipstick    Referral Orders  No referral(s) requested today    Kathe Becton,  MSN, FNP-C Patient [place], [place] [address] [phone]   Problem List Items Addressed This Visit      Other   Class 2 severe obesity due to excess calories with serious comorbidity and body mass index (BMI) of 35.0 to 35.9 in adult [facility])    Other Visit Diagnoses    Essential hypertension    -  Primary   Relevant Orders   POCT urinalysis dipstick (Completed)   History of recent fall       Follow up          No orders of the defined types were placed in this encounter.   Follow-up: Return in about 6 months (around 07/02/2019).    Azzie Glatter, FNP

## 2018-12-31 DIAGNOSIS — Z9181 History of falling: Secondary | ICD-10-CM | POA: Insufficient documentation

## 2019-03-07 ENCOUNTER — Encounter: Payer: Self-pay | Admitting: Family Medicine

## 2019-03-07 ENCOUNTER — Other Ambulatory Visit: Payer: Self-pay

## 2019-03-07 ENCOUNTER — Ambulatory Visit (INDEPENDENT_AMBULATORY_CARE_PROVIDER_SITE_OTHER): Payer: BC Managed Care – PPO | Admitting: Family Medicine

## 2019-03-07 VITALS — BP 138/86 | HR 66 | Temp 97.7°F | Ht 65.0 in | Wt 217.0 lb

## 2019-03-07 DIAGNOSIS — M545 Low back pain, unspecified: Secondary | ICD-10-CM | POA: Insufficient documentation

## 2019-03-07 DIAGNOSIS — I10 Essential (primary) hypertension: Secondary | ICD-10-CM

## 2019-03-07 DIAGNOSIS — Z09 Encounter for follow-up examination after completed treatment for conditions other than malignant neoplasm: Secondary | ICD-10-CM | POA: Diagnosis not present

## 2019-03-07 DIAGNOSIS — M62830 Muscle spasm of back: Secondary | ICD-10-CM | POA: Diagnosis not present

## 2019-03-07 LAB — POCT URINALYSIS DIP (MANUAL ENTRY)
Bilirubin, UA: NEGATIVE
Blood, UA: NEGATIVE
Glucose, UA: NEGATIVE mg/dL
Nitrite, UA: NEGATIVE
Protein Ur, POC: NEGATIVE mg/dL
Spec Grav, UA: 1.015 (ref 1.010–1.025)
Urobilinogen, UA: 1 E.U./dL
pH, UA: 7 (ref 5.0–8.0)

## 2019-03-07 MED ORDER — HYDROCODONE-ACETAMINOPHEN 5-325 MG PO TABS: ORAL_TABLET | ORAL | 0 refills | Status: DC

## 2019-03-07 MED ORDER — CYCLOBENZAPRINE HCL 10 MG PO TABS: 10.0000 mg | ORAL_TABLET | Freq: Three times a day (TID) | ORAL | 2 refills | Status: DC | PRN

## 2019-03-07 NOTE — Patient Instructions (Signed)
Chronic Back Pain When back pain lasts longer than 3 months, it is called chronic back pain. Pain may get worse at certain times (flare-ups). There are things you can do at home to manage your pain. Follow these instructions at home: Activity      Avoid bending and other activities that make pain worse.  When standing: ? Keep your upper back and neck straight. ? Keep your shoulders pulled back. ? Avoid slouching.  When sitting: ? Keep your back straight. ? Relax your shoulders. Do not round your shoulders or pull them backward.  Do not sit or stand in one place for long periods of time.  Take short rest breaks during the day. Lying down or standing is usually better than sitting. Resting can help relieve pain.  When sitting or lying down for a long time, do some mild activity or stretching. This will help to prevent stiffness and pain.  Get regular exercise. Ask your doctor what activities are safe for you.  Do not lift anything that is heavier than 10 lb (4.5 kg). To prevent injury when you lift things: ? Bend your knees. ? Keep the weight close to your body. ? Avoid twisting. Managing pain  If told, put ice on the painful area. Your doctor may tell you to use ice for 24-48 hours after a flare-up starts. ? Put ice in a plastic bag. ? Place a towel between your skin and the bag. ? Leave the ice on for 20 minutes, 2-3 times a day.  If told, put heat on the painful area as often as told by your doctor. Use the heat source that your doctor recommends, such as a moist heat pack or a heating pad. ? Place a towel between your skin and the heat source. ? Leave the heat on for 20-30 minutes. ? Remove the heat if your skin turns bright red. This is especially important if you are unable to feel pain, heat, or cold. You may have a greater risk of getting burned.  Soak in a warm bath. This can help relieve pain.  Take over-the-counter and prescription medicines only as told by your  doctor. General instructions  Sleep on a firm mattress. Try lying on your side with your knees slightly bent. If you lie on your back, put a pillow under your knees.  Keep all follow-up visits as told by your doctor. This is important. Contact a doctor if:  You have pain that does not get better with rest or medicine. Get help right away if:  One or both of your arms or legs feel weak.  One or both of your arms or legs lose feeling (numbness).  You have trouble controlling when you poop (bowel movement) or pee (urinate).  You feel sick to your stomach (nauseous).  You throw up (vomit).  You have belly (abdominal) pain.  You have shortness of breath.  You pass out (faint). Summary  When back pain lasts longer than 3 months, it is called chronic back pain.  Pain may get worse at certain times (flare-ups).  Use ice and heat as told by your doctor. Your doctor may tell you to use ice after flare-ups. This information is not intended to replace advice given to you by your health care provider. Make sure you discuss any questions you have with your health care provider. Document Released: 01/27/2008 Document Revised: 12/01/2018 Document Reviewed: 03/25/2017 Elsevier Patient Education  2020 [place]. Cyclobenzaprine tablets What is this medicine? CYCLOBENZAPRINE (sye  kloe BEN za preen) is a muscle relaxer. It is used to treat muscle pain, spasms, and stiffness. This medicine may be used for other purposes; ask your health care provider or pharmacist if you have questions. COMMON BRAND NAME(S): Fexmid, Flexeril What should I tell my health care provider before I take this medicine? They need to know if you have any of these conditions:  heart disease, irregular heartbeat, or previous heart attack  liver disease  thyroid problem  an unusual or allergic reaction to cyclobenzaprine, tricyclic antidepressants, lactose, other medicines, foods, dyes, or preservatives   pregnant or trying to get pregnant  breast-feeding How should I use this medicine? Take this medicine by mouth with a glass of water. Follow the directions on the prescription label. If this medicine upsets your stomach, take it with food or milk. Take your medicine at regular intervals. Do not take it more often than directed. Talk to your pediatrician regarding the use of this medicine in children. Special care may be needed. Overdosage: If you think you have taken too much of this medicine contact a poison control center or emergency room at once. NOTE: This medicine is only for you. Do not share this medicine with others. What if I miss a dose? If you miss a dose, take it as soon as you can. If it is almost time for your next dose, take only that dose. Do not take double or extra doses. What may interact with this medicine? Do not take this medicine with any of the following medications:  MAOIs like Carbex, Eldepryl, Marplan, Nardil, and Parnate  narcotic medicines for cough  safinamide This medicine may also interact with the following medications:  alcohol  bupropion  antihistamines for allergy, cough and cold  certain medicines for anxiety or sleep  certain medicines for bladder problems like oxybutynin, tolterodine  certain medicines for depression like amitriptyline, fluoxetine, sertraline  certain medicines for Parkinson's disease like benztropine, trihexyphenidyl  certain medicines for seizures like phenobarbital, primidone  certain medicines for stomach problems like dicyclomine, hyoscyamine  certain medicines for travel sickness like scopolamine  general anesthetics like halothane, isoflurane, methoxyflurane, propofol  ipratropium  local anesthetics like lidocaine, pramoxine, tetracaine  medicines that relax muscles for surgery  narcotic medicines for pain  phenothiazines like chlorpromazine, mesoridazine, prochlorperazine, thioridazine  verapamil  This list may not describe all possible interactions. Give your health care provider a list of all the medicines, herbs, non-prescription drugs, or dietary supplements you use. Also tell them if you smoke, drink alcohol, or use illegal drugs. Some items may interact with your medicine. What should I watch for while using this medicine? Tell your doctor or health care professional if your symptoms do not start to get better or if they get worse. You may get drowsy or dizzy. Do not drive, use machinery, or do anything that needs mental alertness until you know how this medicine affects you. Do not stand or sit up quickly, especially if you are an older patient. This reduces the risk of dizzy or fainting spells. Alcohol may interfere with the effect of this medicine. Avoid alcoholic drinks. If you are taking another medicine that also causes drowsiness, you may have more side effects. Give your health care provider a list of all medicines you use. Your doctor will tell you how much medicine to take. Do not take more medicine than directed. Call emergency for help if you have problems breathing or unusual sleepiness. Your mouth may get dry. Chewing sugarless  gum or sucking hard candy, and drinking plenty of water may help. Contact your doctor if the problem does not go away or is severe. What side effects may I notice from receiving this medicine? Side effects that you should report to your doctor or health care professional as soon as possible:  allergic reactions like skin rash, itching or hives, swelling of the face, lips, or tongue  breathing problems  chest pain  fast, irregular heartbeat  hallucinations  seizures  unusually weak or tired Side effects that usually do not require medical attention (report to your doctor or health care professional if they continue or are bothersome):  headache  nausea, vomiting This list may not describe all possible side effects. Call your doctor for  medical advice about side effects. You may report side effects to FDA at 1-800-FDA-1088. Where should I keep my medicine? Keep out of the reach of children. Store at room temperature between 15 and 30 degrees C (59 and 86 degrees F). Keep container tightly closed. Throw away any unused medicine after the expiration date. NOTE: This sheet is a summary. It may not cover all possible information. If you have questions about this medicine, talk to your doctor, pharmacist, or health care provider.  2020 Elsevier/Gold Standard (2018-07-13 12:49:26) Acetaminophen; Hydrocodone tablets or capsules What is this medicine? ACETAMINOPHEN; HYDROCODONE (a set a MEE noe fen; hye droe KOE done) is a pain reliever. It is used to treat moderate to severe pain. This medicine may be used for other purposes; ask your health care provider or pharmacist if you have questions. COMMON BRAND NAME(S): Anexsia, Bancap HC, Ceta-Plus, Co-Gesic, Comfortpak, Dolagesic, [employer], DuoCet, Hydrocet, Hydrogesic, [place], Lorcet HD, Lorcet Plus, Lortab, Margesic H, Maxidone, [place], Polygesic, [place], [place], [place], Vicodin, Vicodin ES, Vicodin HP, Charlane Ferretti What should I tell my health care provider before I take this medicine? They need to know if you have any of these conditions:  brain tumor  Crohn's disease, inflammatory bowel disease, or ulcerative colitis  drug abuse or addiction  head injury  heart or circulation problems  if you often drink alcohol  kidney disease or problems going to the bathroom  liver disease  lung disease, asthma, or breathing problems  an unusual or allergic reaction to acetaminophen, hydrocodone, other opioid analgesics, other medicines, foods, dyes, or preservatives  pregnant or trying to get pregnant  breast-feeding How should I use this medicine? Take this medicine by mouth with a glass of water. Follow the directions on the prescription label. You can take it with  or without food. If it upsets your stomach, take it with food. Do not take your medicine more often than directed. A special MedGuide will be given to you by the pharmacist with each prescription and refill. Be sure to read this information carefully each time. Talk to your pediatrician regarding the use of this medicine in children. Special care may be needed. Overdosage: If you think you have taken too much of this medicine contact a poison control center or emergency room at once. NOTE: This medicine is only for you. Do not share this medicine with others. What if I miss a dose? If you miss a dose, take it as soon as you can. If it is almost time for your next dose, take only that dose. Do not take double or extra doses. What may interact with this medicine? This medicine may interact with the following medications:  alcohol  antiviral medicines for HIV or AIDS  atropine  antihistamines for allergy, cough and cold  certain antibiotics like erythromycin, clarithromycin  certain medicines for anxiety or sleep  certain medicines for bladder problems like oxybutynin, tolterodine  certain medicines for depression like amitriptyline, fluoxetine, sertraline  certain medicines for fungal infections like ketoconazole and itraconazole  certain medicines for Parkinson's disease like benztropine, trihexyphenidyl  certain medicines for seizures like carbamazepine, phenobarbital, phenytoin, primidone  certain medicines for stomach problems like dicyclomine, hyoscyamine  certain medicines for travel sickness like scopolamine  general anesthetics like halothane, isoflurane, methoxyflurane, propofol  ipratropium  local anesthetics like lidocaine, pramoxine, tetracaine  MAOIs like Carbex, Eldepryl, Marplan, Nardil, and Parnate  medicines that relax muscles for surgery  other medicines with acetaminophen  other narcotic medicines for pain or cough  phenothiazines like  chlorpromazine, mesoridazine, prochlorperazine, thioridazine  rifampin This list may not describe all possible interactions. Give your health care provider a list of all the medicines, herbs, non-prescription drugs, or dietary supplements you use. Also tell them if you smoke, drink alcohol, or use illegal drugs. Some items may interact with your medicine. What should I watch for while using this medicine? Tell your doctor or health care professional if your pain does not go away, if it gets worse, or if you have new or a different type of pain. You may develop tolerance to the medicine. Tolerance means that you will need a higher dose of the medicine for pain relief. Tolerance is normal and is expected if you take the medicine for a long time. Do not suddenly stop taking your medicine because you may develop a severe reaction. Your body becomes used to the medicine. This does NOT mean you are addicted. Addiction is a behavior related to getting and using a drug for a non-medical reason. If you have pain, you have a medical reason to take pain medicine. Your doctor will tell you how much medicine to take. If your doctor wants you to stop the medicine, the dose will be slowly lowered over time to avoid any side effects. There are different types of narcotic medicines (opiates). If you take more than one type at the same time or if you are taking another medicine that also causes drowsiness, you may have more side effects. Give your health care provider a list of all medicines you use. Your doctor will tell you how much medicine to take. Do not take more medicine than directed. Call emergency for help if you have problems breathing or unusual sleepiness. Do not take other medicines that contain acetaminophen with this medicine. Always read labels carefully. If you have questions, ask your doctor or pharmacist. If you take too much acetaminophen get medical help right away. Too much acetaminophen can be very  dangerous and cause liver damage. Even if you do not have symptoms, it is important to get help right away. You may get drowsy or dizzy. Do not drive, use machinery, or do anything that needs mental alertness until you know how this medicine affects you. Do not stand or sit up quickly, especially if you are an older patient. This reduces the risk of dizzy or fainting spells. Alcohol may interfere with the effect of this medicine. Avoid alcoholic drinks. The medicine will cause constipation. Try to have a bowel movement at least every 2 to 3 days. If you do not have a bowel movement for 3 days, call your doctor or health care professional. Your mouth may get dry. Chewing sugarless gum or sucking hard candy, and drinking plenty of  water may help. Contact your doctor if the problem does not go away or is severe. What side effects may I notice from receiving this medicine? Side effects that you should report to your doctor or health care professional as soon as possible:  allergic reactions like skin rash, itching or hives, swelling of the face, lips, or tongue  breathing problems  confusion  redness, blistering, peeling or loosening of the skin, including inside the mouth  signs and symptoms of low blood pressure like dizziness; feeling faint or lightheaded, falls; unusually weak or tired  trouble passing urine or change in the amount of urine  yellowing of the eyes or skin Side effects that usually do not require medical attention (report to your doctor or health care professional if they continue or are bothersome):  constipation  dry mouth  nausea, vomiting  tiredness This list may not describe all possible side effects. Call your doctor for medical advice about side effects. You may report side effects to FDA at 1-800-FDA-1088. Where should I keep my medicine? Keep out of the reach of children. This medicine can be abused. Keep your medicine in a safe place to protect it from theft.  Do not share this medicine with anyone. Selling or giving away this medicine is dangerous and against the law. Store at room temperature between 15 and 30 degrees C (59 and 86 degrees F). This medicine may cause harm and death if it is taken by other adults, children, or pets. Return medicine that has not been used to an official disposal site. Contact the DEA at (786)534-1128 or your city/county government to find a site. If you cannot return the medicine, flush it down the toilet. Do not use the medicine after the expiration date. NOTE: This sheet is a summary. It may not cover all possible information. If you have questions about this medicine, talk to your doctor, pharmacist, or health care provider.  2020 Elsevier/Gold Standard (2016-12-15 12:44:49)

## 2019-03-07 NOTE — Progress Notes (Signed)
[facility]   Sick Visit  Subjective:  Patient ID: Jackie Reed, female    DOB: 09/05/1965  Age: 53 y.o. MRN: [id number]  CC:  Chief Complaint  Patient presents with  . Back Pain    since saturday     HPI Jackie Reed is a 53 year old female who presents for follow up.  Past Medical History:  Diagnosis Date  . Hypertension   . Migraine   . Plantar fibromatosis   . PONV (postoperative nausea and vomiting)    Current Status: Since her last office visit, she has recent increasing c/o back pain X 4 days now, r/t previous back injury on 10/21/2018. She has taken OTC pain medications, heating pads, and used pain patches with no relief. She denies visual changes, chest pain, cough, shortness of breath, heart palpitations, and falls. She has occasional headaches and dizziness with position changes. Denies severe headaches, confusion, seizures, double vision, and blurred vision, nausea and vomiting.  She denies fevers, chills, fatigue, recent infections, weight loss, and night sweats. No reports of GI problems such as diarrhea, and constipation. She has no reports of blood in stools, dysuria and hematuria. No depression or anxiety reported today.   Past Surgical History:  Procedure Laterality Date  . ABDOMINAL HYSTERECTOMY    . FOOT SURGERY  1985  . MASS EXCISION Right 05/19/2018   Procedure: EXCISION PALMAR NODULES RIGHT HAND;  Surgeon: Leanora Cover, MD;  Location: [facility];  Service: Orthopedics;  Laterality: Right;  . PARTIAL HYSTERECTOMY  1997    Family History  Problem Relation Age of Onset  . Heart failure Mother   . Diabetes Mother   . Heart failure Father   . Hypertension Father   . Hyperlipidemia Father   . Colon cancer Neg Hx   . Colon polyps Neg Hx     Social History   Socioeconomic History  . Marital status: Widowed    Spouse name: Not on file  . Number of children: Not on file  . Years of  education: Not on file  . Highest education level: Not on file  Occupational History  . Not on file  Social Needs  . Financial resource strain: Not on file  . Food insecurity    Worry: Not on file    Inability: Not on file  . Transportation needs    Medical: Not on file    Non-medical: Not on file  Tobacco Use  . Smoking status: Never Smoker  . Smokeless tobacco: Never Used  Substance and Sexual Activity  . Alcohol use: No    Alcohol/week: 0.0 standard drinks  . Drug use: No  . Sexual activity: Yes    Birth control/protection: None  Lifestyle  . Physical activity    Days per week: Not on file    Minutes per session: Not on file  . Stress: Not on file  Relationships  . Social Herbalist on phone: Not on file    Gets together: Not on file    Attends religious service: Not on file    Active member of club or organization: Not on file    Attends meetings of clubs or organizations: Not on file    Relationship status: Not on file  . Intimate partner violence    Fear of current or ex partner: Not on file    Emotionally abused: Not on file    Physically abused:  Not on file    Forced sexual activity: Not on file  Other Topics Concern  . Not on file  Social History Narrative  . Not on file    Outpatient Medications Prior to Visit  Medication Sig Dispense Refill  . losartan (COZAAR) 25 MG tablet Take 1 tablet (25 mg total) by mouth daily. 90 tablet 1  . HYDROcodone-acetaminophen (NORCO) 5-325 MG tablet 1-2 tabs po q6 hours prn pain (Patient not taking: Reported on 07/26/2018) 20 tablet 0   No facility-administered medications prior to visit.     Allergies  Allergen Reactions  . Morphine And Related Rash    At injection site  . Tramadol Nausea And Vomiting    ROS Review of Systems  Constitutional: Negative.   HENT: Positive for congestion.   Eyes: Negative.   Respiratory: Negative.   Cardiovascular: Negative.   Gastrointestinal: Negative.   Endocrine:  Negative.   Genitourinary: Negative.   Musculoskeletal: Negative.   Skin: Negative.   Allergic/Immunologic: Negative.   Neurological: Positive for dizziness (occasional ) and headaches (Occasional).  Hematological: Negative.   Psychiatric/Behavioral: Negative.       Objective:    Physical Exam  Constitutional: She is oriented to person, place, and time. She appears well-developed and well-nourished.  HENT:  Head: Normocephalic and atraumatic.  Eyes: Conjunctivae are normal.  Neck: Normal range of motion. Neck supple.  Cardiovascular: Normal rate, regular rhythm, normal heart sounds and intact distal pulses.  Pulmonary/Chest: Effort normal and breath sounds normal.  Abdominal: Soft. Bowel sounds are normal.  Musculoskeletal: Normal range of motion.  Neurological: She is alert and oriented to person, place, and time.  Skin: Skin is warm and dry.  Psychiatric: She has a normal mood and affect. Her behavior is normal. Judgment and thought content normal.  Nursing note and vitals reviewed.   BP 138/86 (BP Location: Left Arm, Patient Position: Sitting, Cuff Size: Large)   Pulse 66   Temp 97.7 F (36.5 C) (Oral)   Ht 5\' 5"  (1.651 m)   Wt 217 lb (98.4 kg)   SpO2 100%   BMI 36.11 kg/m  Wt Readings from Last 3 Encounters:  03/07/19 217 lb (98.4 kg)  12/30/18 217 lb (98.4 kg)  10/31/18 216 lb (98 kg)     Health Maintenance Due  Topic Date Due  . HIV Screening  03/15/1980  . TETANUS/TDAP  03/15/1984    There are no preventive care reminders to display for this patient.  Lab Results  Component Value Date   TSH 1.070 03/25/2018   Lab Results  Component Value Date   WBC 9.4 10/21/2018   HGB 11.5 (L) 10/21/2018   HCT 38.0 10/21/2018   MCV 73.5 (L) 10/21/2018   PLT 351 10/21/2018   Lab Results  Component Value Date   NA 137 10/21/2018   K 3.2 (L) 10/21/2018   CO2 18 (L) 10/21/2018   GLUCOSE 178 (H) 10/21/2018   BUN 15 10/21/2018   CREATININE 0.63 10/21/2018    BILITOT 0.6 10/21/2018   ALKPHOS 154 (H) 10/21/2018   AST 19 10/21/2018   ALT 12 10/21/2018   PROT 7.9 10/21/2018   ALBUMIN 4.0 10/21/2018   CALCIUM 9.0 10/21/2018   ANIONGAP 11 10/21/2018   Lab Results  Component Value Date   CHOL 161 03/25/2018   Lab Results  Component Value Date   HDL 47 03/25/2018   Lab Results  Component Value Date   LDLCALC 76 03/25/2018   Lab Results  Component Value  Date   TRIG 192 (H) 03/25/2018   Lab Results  Component Value Date   CHOLHDL 3.4 03/25/2018   Lab Results  Component Value Date   HGBA1C 5.7 (A) 10/31/2018   Assessment & Plan:    1. Low back pain without sciatica, unspecified back pain laterality, unspecified chronicity We will initiate Vicodin today.  - POCT urinalysis dipstick - HYDROcodone-acetaminophen (NORCO) 5-325 MG tablet; 1-2 tabs po q6 hours prn pain  Dispense: 20 tablet; Refill: 0  2. Essential hypertension The current medical regimen is effective; blood pressure is stable at 128/86 today; continue present plan and medications as prescribed. She will continue to decrease high sodium intake, excessive alcohol intake, increase potassium intake, smoking cessation, and increase physical activity of at least 30 minutes of cardio activity daily. She will continue to follow Heart Healthy or DASH diet.  3. Back muscle spasm We will initiated Flexeril today.  - cyclobenzaprine (FLEXERIL) 10 MG tablet; Take 1 tablet (10 mg total) by mouth 3 (three) times daily as needed for muscle spasms.  Dispense: 30 tablet; Refill: 2 - HYDROcodone-acetaminophen (NORCO) 5-325 MG tablet; 1-2 tabs po q6 hours prn pain  Dispense: 20 tablet; Refill: 0  4. Follow up She will keep previously scheduled follow up appointment as scheduled.   Meds ordered this encounter  Medications  . cyclobenzaprine (FLEXERIL) 10 MG tablet    Sig: Take 1 tablet (10 mg total) by mouth 3 (three) times daily as needed for muscle spasms.    Dispense:  30 tablet     Refill:  2  . HYDROcodone-acetaminophen (NORCO) 5-325 MG tablet    Sig: 1-2 tabs po q6 hours prn pain    Dispense:  20 tablet    Refill:  0    Orders Placed This Encounter  Procedures  . POCT urinalysis dipstick    Referral Orders  No referral(s) requested today    Kathe Becton,  MSN, FNP-BC Patient [facility] [place], [place] [address] [phone]  Problem List Items Addressed This Visit    None    Visit Diagnoses    Low back pain without sciatica, unspecified back pain laterality, unspecified chronicity    -  Primary   Relevant Medications   cyclobenzaprine (FLEXERIL) 10 MG tablet   HYDROcodone-acetaminophen (NORCO) 5-325 MG tablet   Other Relevant Orders   POCT urinalysis dipstick (Completed)   Essential hypertension       Back muscle spasm       Relevant Medications   cyclobenzaprine (FLEXERIL) 10 MG tablet   HYDROcodone-acetaminophen (NORCO) 5-325 MG tablet   Follow up          Meds ordered this encounter  Medications  . cyclobenzaprine (FLEXERIL) 10 MG tablet    Sig: Take 1 tablet (10 mg total) by mouth 3 (three) times daily as needed for muscle spasms.    Dispense:  30 tablet    Refill:  2  . HYDROcodone-acetaminophen (NORCO) 5-325 MG tablet    Sig: 1-2 tabs po q6 hours prn pain    Dispense:  20 tablet    Refill:  0    Follow-up: No follow-ups on file.    Azzie Glatter, FNP

## 2019-03-08 ENCOUNTER — Ambulatory Visit: Payer: Self-pay | Admitting: Family Medicine

## 2019-04-08 ENCOUNTER — Encounter (HOSPITAL_COMMUNITY): Payer: Self-pay | Admitting: Emergency Medicine

## 2019-04-08 ENCOUNTER — Ambulatory Visit (HOSPITAL_COMMUNITY)
Admission: EM | Admit: 2019-04-08 | Discharge: 2019-04-08 | Disposition: A | Discharge status: Home / Self Care | Payer: BC Managed Care – PPO | Attending: Emergency Medicine | Admitting: Emergency Medicine

## 2019-04-08 ENCOUNTER — Other Ambulatory Visit: Payer: Self-pay

## 2019-04-08 DIAGNOSIS — I10 Essential (primary) hypertension: Secondary | ICD-10-CM

## 2019-04-08 DIAGNOSIS — K029 Dental caries, unspecified: Secondary | ICD-10-CM

## 2019-04-08 DIAGNOSIS — M545 Low back pain, unspecified: Secondary | ICD-10-CM

## 2019-04-08 DIAGNOSIS — M62830 Muscle spasm of back: Secondary | ICD-10-CM

## 2019-04-08 MED ORDER — AMOXICILLIN 500 MG PO CAPS: 500.0000 mg | ORAL_CAPSULE | Freq: Three times a day (TID) | ORAL | 0 refills | Status: DC

## 2019-04-08 MED ORDER — HYDROCODONE-ACETAMINOPHEN 5-325 MG PO TABS: 2.0000 | ORAL_TABLET | ORAL | 0 refills | Status: DC | PRN

## 2019-04-08 NOTE — Discharge Instructions (Signed)
Take the antibiotic amoxicillin as prescribed.  You can take the prescribed pain medication Norco as needed for severe pain but do not drive, operate machinery, or drink alcohol while taking this medication as it may make you drowsy.  Follow-up with your dentist on Monday.  Your blood pressure was elevated today at 177/108.  Please follow up with your primary care provider this week to have this rechecked.

## 2019-04-08 NOTE — ED Provider Notes (Signed)
[facility]    CSN: [id number] Arrival date & time: 04/08/19  1119     History   Chief Complaint Chief Complaint  Patient presents with  . Facial Pain    HPI Jackie Reed is a 53 y.o. female.   Patient presents with right lower jaw and facial pain since yesterday evening.  She denies injury or fall.  She believes it is related to cavities in her right lower teeth.  She states she had 3 teeth pulled in April but has not had pain since then.  She denies fever, difficulty swallowing, difficulty chewing, difficulty breathing, or other symptoms.  Patient describes the pain as throbbing, 9/10, worse with ceiling fan blowing down air on her, better with warm compresses, unchanged with taking Aleve, unchanged with chewing or drinking liquids.      The history is provided by the patient.    Past Medical History:  Diagnosis Date  . Hypertension   . Migraine   . Plantar fibromatosis   . PONV (postoperative nausea and vomiting)     Patient Active Problem List   Diagnosis Date Noted  . Low back pain without sciatica 03/07/2019  . Back muscle spasm 03/07/2019  . History of recent fall 12/31/2018  . Class 2 severe obesity due to excess calories with serious comorbidity and body mass index (BMI) of 35.0 to 35.9 in adult ([place]) 10/31/2018  . Essential hypertension, benign 11/08/2015  . Tendon calcification 11/08/2015  . Adjustment disorder with mixed anxiety and depressed mood 11/08/2015  . Insomnia 11/08/2015    Past Surgical History:  Procedure Laterality Date  . ABDOMINAL HYSTERECTOMY    . FOOT SURGERY  1985  . MASS EXCISION Right 05/19/2018   Procedure: EXCISION PALMAR NODULES RIGHT HAND;  Surgeon: Leanora Cover, MD;  Location: [facility];  Service: Orthopedics;  Laterality: Right;  . PARTIAL HYSTERECTOMY  1997    OB History   No obstetric history on file.      Home Medications    Prior to Admission medications   Medication Sig Start Date End  Date Taking? Authorizing Provider  amoxicillin (AMOXIL) 500 MG capsule Take 1 capsule (500 mg total) by mouth 3 (three) times daily. 04/08/19   Sharion Balloon, NP  cyclobenzaprine (FLEXERIL) 10 MG tablet Take 1 tablet (10 mg total) by mouth 3 (three) times daily as needed for muscle spasms. Patient not taking: Reported on 04/08/2019 03/07/19   Azzie Glatter, FNP  HYDROcodone-acetaminophen (NORCO/VICODIN) 5-325 MG tablet Take 2 tablets by mouth every 4 (four) hours as needed. 04/08/19   Sharion Balloon, NP  losartan (COZAAR) 25 MG tablet Take 1 tablet (25 mg total) by mouth daily. 10/31/18   Azzie Glatter, FNP    Family History Family History  Problem Relation Age of Onset  . Heart failure Mother   . Diabetes Mother   . Heart failure Father   . Hypertension Father   . Hyperlipidemia Father   . Colon cancer Neg Hx   . Colon polyps Neg Hx     Social History Social History   Tobacco Use  . Smoking status: Never Smoker  . Smokeless tobacco: Never Used  Substance Use Topics  . Alcohol use: No    Alcohol/week: 0.0 standard drinks  . Drug use: No     Allergies   Morphine and related and Tramadol   Review of Systems Review of Systems  Constitutional: Negative for chills and fever.  HENT: Positive for  dental problem. Negative for ear pain, sore throat and trouble swallowing.   Eyes: Negative for pain and visual disturbance.  Respiratory: Negative for cough and shortness of breath.   Cardiovascular: Negative for chest pain and palpitations.  Gastrointestinal: Negative for abdominal pain and vomiting.  Genitourinary: Negative for dysuria and hematuria.  Musculoskeletal: Negative for back pain.  Skin: Negative for color change and rash.  Neurological: Negative for dizziness, seizures, syncope, facial asymmetry, speech difficulty, weakness, light-headedness, numbness and headaches.  All other systems reviewed and are negative.    Physical Exam Triage Vital Signs ED Triage  Vitals  Enc Vitals Group     BP 04/08/19 1151 (!) 177/108     Pulse Rate 04/08/19 1151 72     Resp 04/08/19 1151 18     Temp 04/08/19 1151 97.8 F (36.6 C)     Temp src --      SpO2 04/08/19 1151 100 %     Weight --      Height --      Head Circumference --      Peak Flow --      Pain Score 04/08/19 1152 9     Pain Loc --      Pain Edu? --      Excl. in [place]? --    No data found.  Updated Vital Signs BP (!) 177/108   Pulse 72   Temp 97.8 F (36.6 C)   Resp 18   SpO2 100%   Visual Acuity Right Eye Distance:   Left Eye Distance:   Bilateral Distance:    Right Eye Near:   Left Eye Near:    Bilateral Near:     Physical Exam Vitals signs and nursing note reviewed.  Constitutional:      General: She is not in acute distress.    Appearance: She is well-developed.  HENT:     Head: Normocephalic and atraumatic.     Right Ear: Tympanic membrane normal.     Left Ear: Tympanic membrane normal.     Nose: Nose normal.     Mouth/Throat:     Mouth: Mucous membranes are moist. No oral lesions.     Dentition: Dental caries present. No dental tenderness or gum lesions.     Pharynx: Oropharynx is clear. Uvula midline.   Eyes:     Conjunctiva/sclera: Conjunctivae normal.  Neck:     Musculoskeletal: Neck supple.  Cardiovascular:     Rate and Rhythm: Normal rate and regular rhythm.     Heart sounds: No murmur.  Pulmonary:     Effort: Pulmonary effort is normal. No respiratory distress.     Breath sounds: Normal breath sounds.  Abdominal:     Palpations: Abdomen is soft.     Tenderness: There is no abdominal tenderness. There is no guarding or rebound.  Musculoskeletal:        General: No swelling.  Lymphadenopathy:     Cervical: No cervical adenopathy.  Skin:    General: Skin is warm and dry.     Findings: No bruising or rash.  Neurological:     Mental Status: She is alert.      UC Treatments / Results  Labs (all labs ordered are listed, but only abnormal  results are displayed) Labs Reviewed - No data to display  EKG   Radiology No results found.  Procedures Procedures (including critical care time)  Medications Ordered in UC Medications - No data to display  Initial Impression /  Assessment and Plan / UC Course  I have reviewed the triage vital signs and the nursing notes.  Pertinent labs & imaging results that were available during my care of the patient were reviewed by me and considered in my medical decision making (see chart for details).    Dental caries.  Elevated blood pressure with known hypertension.  Treating with amoxicillin.  6 tablets of Norco prescribed; instructed patient not to drink alcohol, drive, or operate machinery while taking this medicine.  Instructed patient to call her dentist on Monday to be seen as soon as possible.  Dental resource guide given.  Discussed with patient that her blood pressure is elevated and that she should follow-up with her PCP this week to have it rechecked.  Instructed patient to go to the emergency department or return here if she develops worsening pain, or other symptoms such as dizziness, facial asymmetry, headaches, numbness, speech difficulty, weakness.     Final Clinical Impressions(s) / UC Diagnoses   Final diagnoses:  Elevated blood pressure reading in office with diagnosis of hypertension  Dental caries     Discharge Instructions     Take the antibiotic amoxicillin as prescribed.  You can take the prescribed pain medication Norco as needed for severe pain but do not drive, operate machinery, or drink alcohol while taking this medication as it may make you drowsy.  Follow-up with your dentist on Monday.  Your blood pressure was elevated today at 177/108.  Please follow up with your primary care provider this week to have this rechecked.        ED Prescriptions    Medication Sig Dispense Auth. Provider   HYDROcodone-acetaminophen (NORCO/VICODIN) 5-325 MG tablet Take  2 tablets by mouth every 4 (four) hours as needed. 6 tablet Sharion Balloon, NP   amoxicillin (AMOXIL) 500 MG capsule Take 1 capsule (500 mg total) by mouth 3 (three) times daily. 21 capsule Sharion Balloon, NP     Controlled Substance Prescriptions [place] Controlled Substance Registry consulted? Yes, I have consulted the [place] Controlled Substances Registry for this patient, and feel the risk/benefit ratio today is favorable for proceeding with this prescription for a controlled substance.   Sharion Balloon, NP 04/08/19 [phone]

## 2019-04-08 NOTE — ED Triage Notes (Signed)
Pt c/o pain in her R jaw, unsure the cause, states she had several teeth pulled in April that haven't given her any trouble. Pt used warm compresses to help ease the pain.

## 2019-05-15 ENCOUNTER — Other Ambulatory Visit: Payer: Self-pay

## 2019-05-15 ENCOUNTER — Ambulatory Visit (INDEPENDENT_AMBULATORY_CARE_PROVIDER_SITE_OTHER): Payer: Worker's Compensation | Admitting: Family Medicine

## 2019-05-15 ENCOUNTER — Encounter: Payer: Self-pay | Admitting: Family Medicine

## 2019-05-15 VITALS — BP 147/83 | HR 74 | Temp 98.1°F | Ht 65.0 in | Wt 208.6 lb

## 2019-05-15 DIAGNOSIS — M545 Low back pain, unspecified: Secondary | ICD-10-CM

## 2019-05-15 DIAGNOSIS — E66811 Obesity, class 1: Secondary | ICD-10-CM

## 2019-05-15 DIAGNOSIS — Z09 Encounter for follow-up examination after completed treatment for conditions other than malignant neoplasm: Secondary | ICD-10-CM

## 2019-05-15 DIAGNOSIS — Z6834 Body mass index (BMI) 34.0-34.9, adult: Secondary | ICD-10-CM

## 2019-05-15 DIAGNOSIS — G8929 Other chronic pain: Secondary | ICD-10-CM

## 2019-05-15 DIAGNOSIS — I10 Essential (primary) hypertension: Secondary | ICD-10-CM

## 2019-05-15 DIAGNOSIS — E6609 Other obesity due to excess calories: Secondary | ICD-10-CM

## 2019-05-15 DIAGNOSIS — M5441 Lumbago with sciatica, right side: Secondary | ICD-10-CM

## 2019-05-15 MED ORDER — IBUPROFEN 800 MG PO TABS: 800.0000 mg | ORAL_TABLET | Freq: Three times a day (TID) | ORAL | 3 refills | Status: DC | PRN

## 2019-05-15 NOTE — Patient Instructions (Signed)
Chronic Back Pain When back pain lasts longer than 3 months, it is called chronic back pain. Pain may get worse at certain times (flare-ups). There are things you can do at home to manage your pain. Follow these instructions at home: Activity      Avoid bending and other activities that make pain worse.  When standing: ? Keep your upper back and neck straight. ? Keep your shoulders pulled back. ? Avoid slouching.  When sitting: ? Keep your back straight. ? Relax your shoulders. Do not round your shoulders or pull them backward.  Do not sit or stand in one place for long periods of time.  Take short rest breaks during the day. Lying down or standing is usually better than sitting. Resting can help relieve pain.  When sitting or lying down for a long time, do some mild activity or stretching. This will help to prevent stiffness and pain.  Get regular exercise. Ask your doctor what activities are safe for you.  Do not lift anything that is heavier than 10 lb (4.5 kg). To prevent injury when you lift things: ? Bend your knees. ? Keep the weight close to your body. ? Avoid twisting. Managing pain  If told, put ice on the painful area. Your doctor may tell you to use ice for 24-48 hours after a flare-up starts. ? Put ice in a plastic bag. ? Place a towel between your skin and the bag. ? Leave the ice on for 20 minutes, 2-3 times a day.  If told, put heat on the painful area as often as told by your doctor. Use the heat source that your doctor recommends, such as a moist heat pack or a heating pad. ? Place a towel between your skin and the heat source. ? Leave the heat on for 20-30 minutes. ? Remove the heat if your skin turns bright red. This is especially important if you are unable to feel pain, heat, or cold. You may have a greater risk of getting burned.  Soak in a warm bath. This can help relieve pain.  Take over-the-counter and prescription medicines only as told by your  doctor. General instructions  Sleep on a firm mattress. Try lying on your side with your knees slightly bent. If you lie on your back, put a pillow under your knees.  Keep all follow-up visits as told by your doctor. This is important. Contact a doctor if:  You have pain that does not get better with rest or medicine. Get help right away if:  One or both of your arms or legs feel weak.  One or both of your arms or legs lose feeling (numbness).  You have trouble controlling when you poop (bowel movement) or pee (urinate).  You feel sick to your stomach (nauseous).  You throw up (vomit).  You have belly (abdominal) pain.  You have shortness of breath.  You pass out (faint). Summary  When back pain lasts longer than 3 months, it is called chronic back pain.  Pain may get worse at certain times (flare-ups).  Use ice and heat as told by your doctor. Your doctor may tell you to use ice after flare-ups. This information is not intended to replace advice given to you by your health care provider. Make sure you discuss any questions you have with your health care provider. Document Released: 01/27/2008 Document Revised: 12/01/2018 Document Reviewed: 03/25/2017 Elsevier Patient Education  [place]. Ibuprofen tablets and capsules What is this medicine?  IBUPROFEN (eye BYOO proe fen) is a non-steroidal anti-inflammatory drug (NSAID). It is used for dental pain, fever, headaches or migraines, osteoarthritis, rheumatoid arthritis, or painful monthly periods. It can also relieve minor aches and pains caused by a cold, flu, or sore throat. This medicine may be used for other purposes; ask your health care provider or pharmacist if you have questions. COMMON BRAND NAME(S): Advil, Advil Junior Strength, Advil Migraine, Genpril, Ibren, IBU, Midol, Midol Cramps and Body Aches, Motrin, Motrin IB, Motrin Junior Strength, Motrin Migraine Pain, Samson-8, Toxicology Saliva Collection What  should I tell my health care provider before I take this medicine? They need to know if you have any of these conditions:  cigarette smoker  coronary artery bypass graft (CABG) surgery within the past 2 weeks  drink more than 3 alcohol-containing drinks a day  heart disease  high blood pressure  history of stomach bleeding  kidney disease  liver disease  lung or breathing disease, like asthma  an unusual or allergic reaction to ibuprofen, aspirin, other NSAIDs, other medicines, foods, dyes, or preservatives  pregnant or trying to get pregnant  breast-feeding How should I use this medicine? Take this medicine by mouth with a glass of water. Follow the directions on the prescription label. Take this medicine with food if your stomach gets upset. Try to not lie down for at least 10 minutes after you take the medicine. Take your medicine at regular intervals. Do not take your medicine more often than directed. A special MedGuide will be given to you by the pharmacist with each prescription and refill. Be sure to read this information carefully each time. Talk to your pediatrician regarding the use of this medicine in children. Special care may be needed. Overdosage: If you think you have taken too much of this medicine contact a poison control center or emergency room at once. NOTE: This medicine is only for you. Do not share this medicine with others. What if I miss a dose? If you miss a dose, take it as soon as you can. If it is almost time for your next dose, take only that dose. Do not take double or extra doses. What may interact with this medicine? Do not take this medicine with any of the following medications:  cidofovir  ketorolac  methotrexate  pemetrexed This medicine may also interact with the following medications:  alcohol  aspirin  diuretics  lithium  other drugs for inflammation like prednisone  warfarin This list may not describe all possible  interactions. Give your health care provider a list of all the medicines, herbs, non-prescription drugs, or dietary supplements you use. Also tell them if you smoke, drink alcohol, or use illegal drugs. Some items may interact with your medicine. What should I watch for while using this medicine? Tell your doctor or healthcare provider if your symptoms do not start to get better or if they get worse. This medicine may cause serious skin reactions. They can happen weeks to months after starting the medicine. Contact your healthcare provider right away if you notice fevers or flu-like symptoms with a rash. The rash may be red or purple and then turn into blisters or peeling of the skin. Or, you might notice a red rash with swelling of the face, lips or lymph nodes in your neck or under your arms. This medicine does not prevent heart attack or stroke. In fact, this medicine may increase the chance of a heart attack or stroke. The chance may increase  with longer use of this medicine and in people who have heart disease. If you take aspirin to prevent heart attack or stroke, talk with your doctor or healthcare provider. Do not take other medicines that contain aspirin, ibuprofen, or naproxen with this medicine. Side effects such as stomach upset, nausea, or ulcers may be more likely to occur. Many medicines available without a prescription should not be taken with this medicine. This medicine can cause ulcers and bleeding in the stomach and intestines at any time during treatment. Ulcers and bleeding can happen without warning symptoms and can cause death. To reduce your risk, do not smoke cigarettes or drink alcohol while you are taking this medicine. You may get drowsy or dizzy. Do not drive, use machinery, or do anything that needs mental alertness until you know how this medicine affects you. Do not stand or sit up quickly, especially if you are an older patient. This reduces the risk of dizzy or fainting  spells. This medicine can cause you to bleed more easily. Try to avoid damage to your teeth and gums when you brush or floss your teeth. This medicine may be used to treat migraines. If you take migraine medicines for 10 or more days a month, your migraines may get worse. Keep a diary of headache days and medicine use. Contact your healthcare provider if your migraine attacks occur more frequently. What side effects may I notice from receiving this medicine? Side effects that you should report to your doctor or health care professional as soon as possible:  allergic reactions like skin rash, itching or hives, swelling of the face, lips, or tongue  redness, blistering, peeling or loosening of the skin, including inside the mouth  severe stomach pain  signs and symptoms of bleeding such as bloody or black, tarry stools; red or dark-brown urine; spitting up blood or brown material that looks like coffee grounds; red spots on the skin; unusual bruising or bleeding from the eye, gums, or nose  signs and symptoms of a blood clot such as changes in vision; chest pain; severe, sudden headache; trouble speaking; sudden numbness or weakness of the face, arm, or leg  unexplained weight gain or swelling  unusually weak or tired  yellowing of eyes or skin Side effects that usually do not require medical attention (report to your doctor or health care professional if they continue or are bothersome):  bruising  diarrhea  dizziness, drowsiness  headache  nausea, vomiting This list may not describe all possible side effects. Call your doctor for medical advice about side effects. You may report side effects to FDA at 1-800-FDA-1088. Where should I keep my medicine? Keep out of the reach of children. Store at room temperature between 15 and 30 degrees C (59 and 86 degrees F). Keep container tightly closed. Throw away any unused medicine after the expiration date. NOTE: This sheet is a summary. It  may not cover all possible information. If you have questions about this medicine, talk to your doctor, pharmacist, or health care provider.  2020 Elsevier/Gold Standard (2018-10-26 14:11:00)

## 2019-05-15 NOTE — Progress Notes (Signed)
Patient Jackie [facility]   Established Patient Office Visit  Subjective:  Patient ID: Jackie Reed, female    DOB: Jun 05, 1966  Age: 53 y.o. MRN: [id number]  CC:  Chief Complaint  Patient presents with  . Follow-up    lower back  started in feb. when she fell having pain in mid and lower back , using ice and heat helps some     HPI Jackie Reed is a 53 year old female who presents for Follow up today.   Past Medical History:  Diagnosis Date  . Hypertension   . Migraine   . Plantar fibromatosis   . PONV (postoperative nausea and vomiting)    Current Status: Since her last office visit, she is doing well with no complaints. She continues to have chronic back pain since injury on 10/21/2018. She states that pain is worsened by prolonged sitting, and she can only stand for a limited time. She has trouble rising from sitting. She rates her pain at 8/10 today. She is currently back working on a full-time basis. Her job requires frequent lifting. She denies visual changes, chest pain, cough, shortness of breath, heart palpitations, and falls. She has occasional headaches and dizziness with position changes. Denies severe headaches, confusion, seizures, double vision, and blurred vision, nausea and vomiting.   She denies fevers, chills, fatigue, recent infections, weight loss, and night sweats. No reports of GI problems such as nausea, vomiting, diarrhea, and constipation. She has no reports of blood in stools, dysuria and hematuria.   Past Surgical History:  Procedure Laterality Date  . ABDOMINAL HYSTERECTOMY    . FOOT SURGERY  1985  . MASS EXCISION Right 05/19/2018   Procedure: EXCISION PALMAR NODULES RIGHT HAND;  Surgeon: Leanora Cover, MD;  Location: [facility];  Service: Orthopedics;  Laterality: Right;  . PARTIAL HYSTERECTOMY  1997    Family History  Problem Relation Age of Onset  . Heart failure Mother   . Diabetes Mother   .  Heart failure Father   . Hypertension Father   . Hyperlipidemia Father   . Colon cancer Neg Hx   . Colon polyps Neg Hx     Social History   Socioeconomic History  . Marital status: Widowed    Spouse name: Not on file  . Number of children: Not on file  . Years of education: Not on file  . Highest education level: Not on file  Occupational History  . Not on file  Social Needs  . Financial resource strain: Not on file  . Food insecurity    Worry: Not on file    Inability: Not on file  . Transportation needs    Medical: Not on file    Non-medical: Not on file  Tobacco Use  . Smoking status: Never Smoker  . Smokeless tobacco: Never Used  Substance and Sexual Activity  . Alcohol use: No    Alcohol/week: 0.0 standard drinks  . Drug use: No  . Sexual activity: Yes    Birth control/protection: None  Lifestyle  . Physical activity    Days per week: Not on file    Minutes per session: Not on file  . Stress: Not on file  Relationships  . Social Herbalist on phone: Not on file    Gets together: Not on file    Attends religious service: Not on file    Active member of club or organization: Not  on file    Attends meetings of clubs or organizations: Not on file    Relationship status: Not on file  . Intimate partner violence    Fear of current or ex partner: Not on file    Emotionally abused: Not on file    Physically abused: Not on file    Forced sexual activity: Not on file  Other Topics Concern  . Not on file  Social History Narrative  . Not on file    Outpatient Medications Prior to Visit  Medication Sig Dispense Refill  . losartan (COZAAR) 25 MG tablet Take 1 tablet (25 mg total) by mouth daily. 90 tablet 1  . cyclobenzaprine (FLEXERIL) 10 MG tablet Take 1 tablet (10 mg total) by mouth 3 (three) times daily as needed for muscle spasms. (Patient not taking: Reported on 04/08/2019) 30 tablet 2  . amoxicillin (AMOXIL) 500 MG capsule Take 1 capsule (500 mg  total) by mouth 3 (three) times daily. (Patient not taking: Reported on 05/15/2019) 21 capsule 0  . HYDROcodone-acetaminophen (NORCO/VICODIN) 5-325 MG tablet Take 2 tablets by mouth every 4 (four) hours as needed. (Patient not taking: Reported on 05/15/2019) 6 tablet 0   No facility-administered medications prior to visit.     Allergies  Allergen Reactions  . Morphine And Related Rash    At injection site  . Tramadol Nausea And Vomiting    ROS Review of Systems  Constitutional: Negative.   HENT: Negative.   Eyes: Negative.   Respiratory: Negative.   Cardiovascular: Negative.   Gastrointestinal: Negative.   Endocrine: Negative.   Genitourinary: Negative.   Musculoskeletal: Positive for arthralgias (generalized joint pain) and back pain (chronic back pain; radiates to legs).  Skin: Negative.   Allergic/Immunologic: Negative.   Neurological: Positive for dizziness (occasional ) and headaches (occasional ).  Hematological: Negative.   Psychiatric/Behavioral: Negative.       Objective:    Physical Exam  Constitutional: She is oriented to person, place, and time. She appears well-developed and well-nourished.  HENT:  Head: Normocephalic and atraumatic.  Eyes: Conjunctivae are normal.  Neck: Normal range of motion. Neck supple.  Cardiovascular: Normal rate, regular rhythm, normal heart sounds and intact distal pulses.  Pulmonary/Chest: Effort normal and breath sounds normal.  Abdominal: Soft. Bowel sounds are normal.  Musculoskeletal: Normal range of motion.  Neurological: She is alert and oriented to person, place, and time. She has normal reflexes.  Skin: Skin is warm and dry.  Psychiatric: She has a normal mood and affect. Her behavior is normal. Thought content normal.  Nursing note and vitals reviewed.   BP (!) 147/83 (BP Location: Right Arm, Patient Position: Sitting, Cuff Size: Large)   Pulse 74   Temp 98.1 F (36.7 C) (Oral)   Ht 5\' 5"  (1.651 m)   Wt 208 lb 9.6  oz (94.6 kg)   SpO2 100%   BMI 34.71 kg/m  Wt Readings from Last 3 Encounters:  05/15/19 208 lb 9.6 oz (94.6 kg)  03/07/19 217 lb (98.4 kg)  12/30/18 217 lb (98.4 kg)    Health Maintenance Due  Topic Date Due  . HIV Screening  03/15/1980  . TETANUS/TDAP  03/15/1984  . INFLUENZA VACCINE  03/25/2019    There are no preventive care reminders to display for this patient.  Lab Results  Component Value Date   TSH 1.070 03/25/2018   Lab Results  Component Value Date   WBC 9.4 10/21/2018   HGB 11.5 (L) 10/21/2018   HCT  38.0 10/21/2018   MCV 73.5 (L) 10/21/2018   PLT 351 10/21/2018   Lab Results  Component Value Date   NA 137 10/21/2018   K 3.2 (L) 10/21/2018   CO2 18 (L) 10/21/2018   GLUCOSE 178 (H) 10/21/2018   BUN 15 10/21/2018   CREATININE 0.63 10/21/2018   BILITOT 0.6 10/21/2018   ALKPHOS 154 (H) 10/21/2018   AST 19 10/21/2018   ALT 12 10/21/2018   PROT 7.9 10/21/2018   ALBUMIN 4.0 10/21/2018   CALCIUM 9.0 10/21/2018   ANIONGAP 11 10/21/2018   Lab Results  Component Value Date   CHOL 161 03/25/2018   Lab Results  Component Value Date   HDL 47 03/25/2018   Lab Results  Component Value Date   LDLCALC 76 03/25/2018   Lab Results  Component Value Date   TRIG 192 (H) 03/25/2018   Lab Results  Component Value Date   CHOLHDL 3.4 03/25/2018   Lab Results  Component Value Date   HGBA1C 5.7 (A) 10/31/2018    Assessment & Plan:   1. Chronic bilateral low back pain with right-sided sciatica - MR Lumbar Spine Wo Contrast; Future - ibuprofen (ADVIL) 800 MG tablet; Take 1 tablet (800 mg total) by mouth every 8 (eight) hours as needed.  Dispense: 30 tablet; Refill: 3 - gabapentin (NEURONTIN) 100 MG capsule; Take 1 capsule (100 mg total) by mouth 2 (two) times daily.  Dispense: 60 capsule; Refill: 3  2. Low back pain without sciatica, unspecified back pain laterality, unspecified chronicity We will initiate Motrin and Gabapentin as needed for pain today.   - ibuprofen (ADVIL) 800 MG tablet; Take 1 tablet (800 mg total) by mouth every 8 (eight) hours as needed.  Dispense: 30 tablet; Refill: 3 - gabapentin (NEURONTIN) 100 MG capsule; Take 1 capsule (100 mg total) by mouth 2 (two) times daily.  Dispense: 60 capsule; Refill: 3  3. Essential hypertension The current medical regimen is effective; blood pressure is stable at 147/83 today; continue present plan and medications as prescribed. She will continue to decrease high sodium intake, excessive alcohol intake, increase potassium intake, smoking cessation, and increase physical activity of at least 30 minutes of cardio activity daily. She will continue to follow Heart Healthy or DASH diet.      4. Class 1 obesity due to excess calories with serious comorbidity and body mass index (BMI) of 34.0 to 34.9 in adult She had had a 10 lb weight loss in 6 months. Body mass index is 34.71 kg/m. Goal BMI  is <30. Encouraged efforts to reduce weight include engaging in physical activity as tolerated with goal of 150 minutes per week. Improve dietary choices and eat a meal regimen consistent with a Mediterranean or DASH diet. Reduce simple carbohydrates. Do not skip meals and eat healthy snacks throughout the day to avoid over-eating at dinner. Set a goal weight loss that is achievable for you.  5. Follow up She will follow up in 6 months.   Meds ordered this encounter  Medications  . ibuprofen (ADVIL) 800 MG tablet    Sig: Take 1 tablet (800 mg total) by mouth every 8 (eight) hours as needed.    Dispense:  30 tablet    Refill:  3  . gabapentin (NEURONTIN) 100 MG capsule    Sig: Take 1 capsule (100 mg total) by mouth 2 (two) times daily.    Dispense:  60 capsule    Refill:  3    Orders Placed This Encounter  Procedures  . MR Lumbar Spine Wo Contrast    Referral Orders  No referral(s) requested today    Kathe Becton,  MSN, FNP-BC [address] [place], [place] [address] [phone] fax   Problem List Items Addressed This Visit      Other   Class 2 severe obesity due to excess calories with serious comorbidity and body mass index (BMI) of 35.0 to 35.9 in adult [facility])   Low back pain without sciatica   Relevant Medications   ibuprofen (ADVIL) 800 MG tablet   gabapentin (NEURONTIN) 100 MG capsule    Other Visit Diagnoses    Chronic bilateral low back pain with right-sided sciatica    -  Primary   Relevant Medications   ibuprofen (ADVIL) 800 MG tablet   gabapentin (NEURONTIN) 100 MG capsule   Other Relevant Orders   MR Lumbar Spine Wo Contrast   Essential hypertension       Follow up          Meds ordered this encounter  Medications  . ibuprofen (ADVIL) 800 MG tablet    Sig: Take 1 tablet (800 mg total) by mouth every 8 (eight) hours as needed.    Dispense:  30 tablet    Refill:  3  . gabapentin (NEURONTIN) 100 MG capsule    Sig: Take 1 capsule (100 mg total) by mouth 2 (two) times daily.    Dispense:  60 capsule    Refill:  3    Follow-up: Return in about 6 months (around 11/12/2019).    Azzie Glatter, FNP

## 2019-05-16 MED ORDER — GABAPENTIN 100 MG PO CAPS: 100.0000 mg | ORAL_CAPSULE | Freq: Two times a day (BID) | ORAL | 3 refills | Status: DC

## 2019-05-22 ENCOUNTER — Ambulatory Visit (HOSPITAL_COMMUNITY)
Admission: RE | Admit: 2019-05-22 | Discharge: 2019-05-22 | Disposition: A | Discharge status: Home / Self Care | Payer: Self-pay | Source: Ambulatory Visit | Attending: Family Medicine | Admitting: Family Medicine

## 2019-05-22 ENCOUNTER — Other Ambulatory Visit: Payer: Self-pay

## 2019-05-22 DIAGNOSIS — G8929 Other chronic pain: Secondary | ICD-10-CM | POA: Insufficient documentation

## 2019-05-22 DIAGNOSIS — M5441 Lumbago with sciatica, right side: Secondary | ICD-10-CM | POA: Insufficient documentation

## 2019-05-31 ENCOUNTER — Telehealth: Payer: Self-pay | Admitting: Family Medicine

## 2019-05-31 NOTE — Telephone Encounter (Signed)
Jackie Reed is requesting a call back from her provider. To discuss her recent imaging. No other detail mention or given.   CB# [phone]

## 2019-06-02 NOTE — Telephone Encounter (Signed)
Lanelle Bal has been reminded.

## 2019-06-14 ENCOUNTER — Ambulatory Visit (INDEPENDENT_AMBULATORY_CARE_PROVIDER_SITE_OTHER): Payer: Worker's Compensation | Admitting: Family Medicine

## 2019-06-14 ENCOUNTER — Encounter: Payer: Self-pay | Admitting: Family Medicine

## 2019-06-14 ENCOUNTER — Other Ambulatory Visit: Payer: Self-pay

## 2019-06-14 VITALS — BP 146/65 | HR 74 | Temp 97.6°F | Ht 65.0 in | Wt 210.4 lb

## 2019-06-14 DIAGNOSIS — I10 Essential (primary) hypertension: Secondary | ICD-10-CM

## 2019-06-14 DIAGNOSIS — E66812 Obesity, class 2: Secondary | ICD-10-CM

## 2019-06-14 DIAGNOSIS — M545 Low back pain, unspecified: Secondary | ICD-10-CM

## 2019-06-14 DIAGNOSIS — Z6835 Body mass index (BMI) 35.0-35.9, adult: Secondary | ICD-10-CM

## 2019-06-14 DIAGNOSIS — Z09 Encounter for follow-up examination after completed treatment for conditions other than malignant neoplasm: Secondary | ICD-10-CM

## 2019-06-14 DIAGNOSIS — M5441 Lumbago with sciatica, right side: Secondary | ICD-10-CM

## 2019-06-14 DIAGNOSIS — Z2821 Immunization not carried out because of patient refusal: Secondary | ICD-10-CM

## 2019-06-14 DIAGNOSIS — N390 Urinary tract infection, site not specified: Secondary | ICD-10-CM

## 2019-06-14 DIAGNOSIS — R7303 Prediabetes: Secondary | ICD-10-CM | POA: Insufficient documentation

## 2019-06-14 DIAGNOSIS — G8929 Other chronic pain: Secondary | ICD-10-CM

## 2019-06-14 LAB — POCT URINALYSIS DIPSTICK
Bilirubin, UA: NEGATIVE
Blood, UA: NEGATIVE
Glucose, UA: NEGATIVE
Ketones, UA: NEGATIVE
Nitrite, UA: NEGATIVE
Protein, UA: NEGATIVE
Spec Grav, UA: >=1.03 — AB (ref 1.010–1.025)
Urobilinogen, UA: 0.2 E.U./dL
pH, UA: 5.5 (ref 5.0–8.0)

## 2019-06-14 LAB — POCT GLYCOSYLATED HEMOGLOBIN (HGB A1C): Hemoglobin A1C: 5.5 % (ref 4.0–5.6)

## 2019-06-14 LAB — GLUCOSE, POCT (MANUAL RESULT ENTRY): POC Glucose: 95 mg/dl (ref 70–99)

## 2019-06-14 MED ORDER — SULFAMETHOXAZOLE-TRIMETHOPRIM 800-160 MG PO TABS: 1.0000 | ORAL_TABLET | Freq: Two times a day (BID) | ORAL | 0 refills | Status: DC

## 2019-06-14 NOTE — Progress Notes (Signed)
Patient [facility]   Established Patient Office Visit  Subjective:  Patient ID: Jackie Reed, female    DOB: 1966/02/11  Age: 53 y.o. MRN: [id number]  CC:  Chief Complaint  Patient presents with  . Follow-up    HTN & medication management    HPI Jackie Reed is a 53 year old female who presents for Follow Up today.   Past Medical History:  Diagnosis Date  . Hypertension   . Migraine   . Plantar fibromatosis   . PONV (postoperative nausea and vomiting)    Current Status: Since her last office visit, she continues to have chronic back pain, only being able to stand or sit for a short period of time. Recent MRI on 05/22/2019 revealed several bulging discs. She also had additional MRI on 06/09/2019 which results are not in as of yet. She is scheduled for follow up with Dr. Cay Schillings on 06/22/2019 for follow up review on recent MRI. She is currently not working. She rates her pain today at 8/10. She is currently taking Motrin, Prednisone, Gabapentin, and Flexeril. She denies visual changes, chest pain, cough, shortness of breath, heart palpitations, and falls. She has occasional headaches and dizziness with position changes. Denies severe headaches, confusion, seizures, double vision, and blurred vision, nausea and vomiting. She denies fevers, chills, fatigue, recent infections, weight loss, and night sweats. She has not had any headaches, visual changes, dizziness, and falls. No chest pain, heart palpitations, cough and shortness of breath reported. No reports of GI problems such as nausea, vomiting, diarrhea, and constipation. She has no reports of blood in stools, dysuria and hematuria. Her anxiety is mild today. She denies suicidal ideations, homicidal ideations, or auditory hallucinations.  Past Surgical History:  Procedure Laterality Date  . ABDOMINAL HYSTERECTOMY    . FOOT SURGERY  1985  . MASS EXCISION Right 05/19/2018   Procedure: EXCISION  PALMAR NODULES RIGHT HAND;  Surgeon: Leanora Cover, MD;  Location: [facility];  Service: Orthopedics;  Laterality: Right;  . PARTIAL HYSTERECTOMY  1997    Family History  Problem Relation Age of Onset  . Heart failure Mother   . Diabetes Mother   . Heart failure Father   . Hypertension Father   . Hyperlipidemia Father   . Colon cancer Neg Hx   . Colon polyps Neg Hx     Social History   Socioeconomic History  . Marital status: Widowed    Spouse name: Not on file  . Number of children: Not on file  . Years of education: Not on file  . Highest education level: Not on file  Occupational History  . Not on file  Social Needs  . Financial resource strain: Not on file  . Food insecurity    Worry: Not on file    Inability: Not on file  . Transportation needs    Medical: Not on file    Non-medical: Not on file  Tobacco Use  . Smoking status: Never Smoker  . Smokeless tobacco: Never Used  Substance and Sexual Activity  . Alcohol use: No    Alcohol/week: 0.0 standard drinks  . Drug use: No  . Sexual activity: Yes    Birth control/protection: None  Lifestyle  . Physical activity    Days per week: Not on file    Minutes per session: Not on file  . Stress: Not on file  Relationships  . Social Herbalist on  phone: Not on file    Gets together: Not on file    Attends religious service: Not on file    Active member of club or organization: Not on file    Attends meetings of clubs or organizations: Not on file    Relationship status: Not on file  . Intimate partner violence    Fear of current or ex partner: Not on file    Emotionally abused: Not on file    Physically abused: Not on file    Forced sexual activity: Not on file  Other Topics Concern  . Not on file  Social History Narrative  . Not on file    Outpatient Medications Prior to Visit  Medication Sig Dispense Refill  . cyclobenzaprine (FLEXERIL) 10 MG tablet Take 1 tablet (10 mg total)  by mouth 3 (three) times daily as needed for muscle spasms. 30 tablet 2  . gabapentin (NEURONTIN) 100 MG capsule Take 1 capsule (100 mg total) by mouth 2 (two) times daily. 60 capsule 3  . ibuprofen (ADVIL) 800 MG tablet Take 1 tablet (800 mg total) by mouth every 8 (eight) hours as needed. 30 tablet 3  . losartan (COZAAR) 25 MG tablet Take 1 tablet (25 mg total) by mouth daily. 90 tablet 1   No facility-administered medications prior to visit.     Allergies  Allergen Reactions  . Morphine And Related Rash    At injection site  . Tramadol Nausea And Vomiting    ROS Review of Systems  Constitutional: Negative.   HENT: Negative.   Eyes: Negative.   Respiratory: Negative.   Cardiovascular: Negative.   Gastrointestinal: Negative.   Endocrine: Negative.   Genitourinary: Negative.   Musculoskeletal: Positive for back pain (chronic back pain).  Skin: Negative.   Allergic/Immunologic: Negative.   Neurological: Positive for dizziness (occasional) and headaches (occasional ).  Hematological: Negative.   Psychiatric/Behavioral: Negative.       Objective:    Physical Exam  Constitutional: She is oriented to person, place, and time. She appears well-developed and well-nourished.  HENT:  Head: Normocephalic and atraumatic.  Eyes: Conjunctivae are normal.  Neck: Normal range of motion. Neck supple.  Cardiovascular: Normal rate, regular rhythm, normal heart sounds and intact distal pulses.  Pulmonary/Chest: Effort normal and breath sounds normal.  Abdominal: Soft. Bowel sounds are normal. She exhibits distension (occasional).  Musculoskeletal:     Comments: Limited ROM in spine  Neurological: She is alert and oriented to person, place, and time. She has normal reflexes.  Skin: Skin is warm and dry.  Psychiatric: She has a normal mood and affect. Her behavior is normal. Judgment and thought content normal.  Nursing note and vitals reviewed.   BP (!) 146/65 Comment: Patient has not  taken her BP med today  Pulse 74   Temp 97.6 F (36.4 C) (Oral)   Ht 5\' 5"  (1.651 m)   Wt 210 lb 6.4 oz (95.4 kg)   SpO2 99%   BMI 35.01 kg/m  Wt Readings from Last 3 Encounters:  06/14/19 210 lb 6.4 oz (95.4 kg)  05/15/19 208 lb 9.6 oz (94.6 kg)  03/07/19 217 lb (98.4 kg)     Health Maintenance Due  Topic Date Due  . HIV Screening  03/15/1980  . TETANUS/TDAP  03/15/1984  . INFLUENZA VACCINE  03/25/2019    There are no preventive care reminders to display for this patient.  Lab Results  Component Value Date   TSH 1.070 03/25/2018   Lab  Results  Component Value Date   WBC 9.4 10/21/2018   HGB 11.5 (L) 10/21/2018   HCT 38.0 10/21/2018   MCV 73.5 (L) 10/21/2018   PLT 351 10/21/2018   Lab Results  Component Value Date   NA 137 10/21/2018   K 3.2 (L) 10/21/2018   CO2 18 (L) 10/21/2018   GLUCOSE 178 (H) 10/21/2018   BUN 15 10/21/2018   CREATININE 0.63 10/21/2018   BILITOT 0.6 10/21/2018   ALKPHOS 154 (H) 10/21/2018   AST 19 10/21/2018   ALT 12 10/21/2018   PROT 7.9 10/21/2018   ALBUMIN 4.0 10/21/2018   CALCIUM 9.0 10/21/2018   ANIONGAP 11 10/21/2018   Lab Results  Component Value Date   CHOL 161 03/25/2018   Lab Results  Component Value Date   HDL 47 03/25/2018   Lab Results  Component Value Date   LDLCALC 76 03/25/2018   Lab Results  Component Value Date   TRIG 192 (H) 03/25/2018   Lab Results  Component Value Date   CHOLHDL 3.4 03/25/2018   Lab Results  Component Value Date   HGBA1C 5.5 06/14/2019     Assessment & Plan:   1. Chronic bilateral low back pain with right-sided sciatica  2. Low back pain without sciatica, unspecified back pain laterality, unspecified chronicity  3. Class 2 severe obesity due to excess calories with serious comorbidity and body mass index (BMI) of 35.0 to 35.9 in adult [facility]) Body mass index is 35.01 kg/m. Goal BMI  is <30. Encouraged efforts to reduce weight include engaging in physical activity as  tolerated with goal of 150 minutes per week. Improve dietary choices and eat a meal regimen consistent with a Mediterranean or DASH diet. Reduce simple carbohydrates. Do not skip meals and eat healthy snacks throughout the day to avoid over-eating at dinner. Set a goal weight loss that is achievable for you.  4. Essential hypertension The current medical regimen is effective; blood pressure is stable at 146/65 today; continue present plan and medications as prescribed. She will continue to take medications as prescribed, to decrease high sodium intake, excessive alcohol intake, increase potassium intake, smoking cessation, and increase physical activity of at least 30 minutes of cardio activity daily. She will continue to follow Heart Healthy or DASH diet. - CBC with Differential - Comprehensive metabolic panel - TSH - Lipid Panel - TSH - Vitamin B12 - Vitamin D, 25-hydroxy  5. Pre-diabetes Stable.  She will to decrease foods/beverages high in sugars and carbs and follow Heart Healthy or DASH diet. Increase physical activity to at least 30 minutes cardio exercise daily.  - POCT urinalysis dipstick - POCT glycosylated hemoglobin (Hb A1C) - POCT glucose (manual entry)  6. Urinary tract infection without hematuria, site unspecified We will initiate antibiotic today.  - sulfamethoxazole-trimethoprim (BACTRIM DS) 800-160 MG tablet; Take 1 tablet by mouth 2 (two) times daily.  Dispense: 14 tablet; Refill: 0  7. Influenza vaccine refused Patient will attempt to come in for vaccination at a later date.   8. Follow up She will follow up in 3 months.   Meds ordered this encounter  Medications  . sulfamethoxazole-trimethoprim (BACTRIM DS) 800-160 MG tablet    Sig: Take 1 tablet by mouth 2 (two) times daily.    Dispense:  14 tablet    Refill:  0    Orders Placed This Encounter  Procedures  . CBC with Differential  . Comprehensive metabolic panel  . TSH  . Lipid Panel  . TSH  .  Vitamin  B12  . Vitamin D, 25-hydroxy  . POCT urinalysis dipstick  . POCT glycosylated hemoglobin (Hb A1C)  . POCT glucose (manual entry)    Referral Orders  No referral(s) requested today    Kathe Becton,  MSN, FNP-BC [address] [place], [place] [address] [phone] fax   Problem List Items Addressed This Visit      Other   Class 2 severe obesity due to excess calories with serious comorbidity and body mass index (BMI) of 35.0 to 35.9 in adult [facility])   Low back pain without sciatica    Other Visit Diagnoses    Chronic bilateral low back pain with right-sided sciatica    -  Primary   Essential hypertension       Relevant Orders   CBC with Differential   Comprehensive metabolic panel   TSH   Lipid Panel   TSH   Vitamin B12   Vitamin D, 25-hydroxy   Pre-diabetes       Relevant Orders   POCT urinalysis dipstick (Completed)   POCT glycosylated hemoglobin (Hb A1C) (Completed)   POCT glucose (manual entry) (Completed)   Urinary tract infection without hematuria, site unspecified       Relevant Medications   sulfamethoxazole-trimethoprim (BACTRIM DS) 800-160 MG tablet   Influenza vaccine refused       Follow up          Meds ordered this encounter  Medications  . sulfamethoxazole-trimethoprim (BACTRIM DS) 800-160 MG tablet    Sig: Take 1 tablet by mouth 2 (two) times daily.    Dispense:  14 tablet    Refill:  0    Follow-up: Return in about 3 months (around 09/14/2019).    Azzie Glatter, FNP

## 2019-06-14 NOTE — Patient Instructions (Signed)
Urinary Tract Infection, Adult A urinary tract infection (UTI) is an infection of any part of the urinary tract. The urinary tract includes:  The kidneys.  The ureters.  The bladder.  The urethra. These organs make, store, and get rid of pee (urine) in the body. What are the causes? This is caused by germs (bacteria) in your genital area. These germs grow and cause swelling (inflammation) of your urinary tract. What increases the risk? You are more likely to develop this condition if:  You have a small, thin tube (catheter) to drain pee.  You cannot control when you pee or poop (incontinence).  You are female, and: ? You use these methods to prevent pregnancy: ? A medicine that kills sperm (spermicide). ? A device that blocks sperm (diaphragm). ? You have low levels of a female hormone (estrogen). ? You are pregnant.  You have genes that add to your risk.  You are sexually active.  You take antibiotic medicines.  You have trouble peeing because of: ? A prostate that is bigger than normal, if you are female. ? A blockage in the part of your body that drains pee from the bladder (urethra). ? A kidney stone. ? A nerve condition that affects your bladder (neurogenic bladder). ? Not getting enough to drink. ? Not peeing often enough.  You have other conditions, such as: ? Diabetes. ? A weak disease-fighting system (immune system). ? Sickle cell disease. ? Gout. ? Injury of the spine. What are the signs or symptoms? Symptoms of this condition include:  Needing to pee right away (urgently).  Peeing often.  Peeing small amounts often.  Pain or burning when peeing.  Blood in the pee.  Pee that smells bad or not like normal.  Trouble peeing.  Pee that is cloudy.  Fluid coming from the vagina, if you are female.  Pain in the belly or lower back. Other symptoms include:  Throwing up (vomiting).  No urge to eat.  Feeling mixed up (confused).  Being tired  and grouchy (irritable).  A fever.  Watery poop (diarrhea). How is this treated? This condition may be treated with:  Antibiotic medicine.  Other medicines.  Drinking enough water. Follow these instructions at home:  Medicines  Take over-the-counter and prescription medicines only as told by your doctor.  If you were prescribed an antibiotic medicine, take it as told by your doctor. Do not stop taking it even if you start to feel better. General instructions  Make sure you: ? Pee until your bladder is empty. ? Do not hold pee for a long time. ? Empty your bladder after sex. ? Wipe from front to back after pooping if you are a female. Use each tissue one time when you wipe.  Drink enough fluid to keep your pee pale yellow.  Keep all follow-up visits as told by your doctor. This is important. Contact a doctor if:  You do not get better after 1-2 days.  Your symptoms go away and then come back. Get help right away if:  You have very bad back pain.  You have very bad pain in your lower belly.  You have a fever.  You are sick to your stomach (nauseous).  You are throwing up. Summary  A urinary tract infection (UTI) is an infection of any part of the urinary tract.  This condition is caused by germs in your genital area.  There are many risk factors for a UTI. These include having a small, thin   tube to drain pee and not being able to control when you pee or poop.  Treatment includes antibiotic medicines for germs.  Drink enough fluid to keep your pee pale yellow. This information is not intended to replace advice given to you by your health care provider. Make sure you discuss any questions you have with your health care provider. Document Released: 01/27/2008 Document Revised: 07/28/2018 Document Reviewed: 02/17/2018 Elsevier Patient Education  [place]. Sulfamethoxazole; Trimethoprim, SMX-TMP tablets What is this medicine? SULFAMETHOXAZOLE;  TRIMETHOPRIM or SMX-TMP (suhl fuh meth OK suh zohl; trye METH oh prim) is a combination of a sulfonamide antibiotic and a second antibiotic, trimethoprim. It is used to treat or prevent certain kinds of bacterial infections. It will not work for colds, flu, or other viral infections. This medicine may be used for other purposes; ask your health care provider or pharmacist if you have questions. COMMON BRAND NAME(S): Bacter-Aid DS, Bactrim, Bactrim DS, Septra, Septra DS What should I tell my health care provider before I take this medicine? They need to know if you have any of these conditions:  anemia  asthma  being treated with anticonvulsants  if you frequently drink alcohol containing drinks  kidney disease  liver disease  low level of folic acid or [id number] dehydrogenase  poor nutrition or malabsorption  porphyria  severe allergies  thyroid disorder  an unusual or allergic reaction to sulfamethoxazole, trimethoprim, sulfa drugs, other medicines, foods, dyes, or preservatives  pregnant or trying to get pregnant  breast-feeding How should I use this medicine? Take this medicine by mouth with a full glass of water. Follow the directions on the prescription label. Take your medicine at regular intervals. Do not take it more often than directed. Do not skip doses or stop your medicine early. Talk to your pediatrician regarding the use of this medicine in children. Special care may be needed. This medicine has been used in children as young as 26 months of age. Overdosage: If you think you have taken too much of this medicine contact a poison control center or emergency room at once. NOTE: This medicine is only for you. Do not share this medicine with others. What if I miss a dose? If you miss a dose, take it as soon as you can. If it is almost time for your next dose, take only that dose. Do not take double or extra doses. What may interact with this medicine? Do not  take this medicine with any of the following medications:  aminobenzoate potassium  dofetilide  metronidazole This medicine may also interact with the following medications:  ACE inhibitors like benazepril, enalapril, lisinopril, and ramipril  birth control pills  cyclosporine  digoxin  diuretics  indomethacin  medicines for diabetes  methenamine  methotrexate  phenytoin  potassium supplements  pyrimethamine  sulfinpyrazone  tricyclic antidepressants  warfarin This list may not describe all possible interactions. Give your health care provider a list of all the medicines, herbs, non-prescription drugs, or dietary supplements you use. Also tell them if you smoke, drink alcohol, or use illegal drugs. Some items may interact with your medicine. What should I watch for while using this medicine? Tell your doctor or health care professional if your symptoms do not improve. Drink several glasses of water a day to reduce the risk of kidney problems. Do not treat diarrhea with over the counter products. Contact your doctor if you have diarrhea that lasts more than 2 days or if it is severe and watery.  This medicine can make you more sensitive to the sun. Keep out of the sun. If you cannot avoid being in the sun, wear protective clothing and use a sunscreen. Do not use sun lamps or tanning beds/booths. What side effects may I notice from receiving this medicine? Side effects that you should report to your doctor or health care professional as soon as possible:  allergic reactions like skin rash or hives, swelling of the face, lips, or tongue  breathing problems  fever or chills, sore throat  irregular heartbeat, chest pain  joint or muscle pain  pain or difficulty passing urine  red pinpoint spots on skin  redness, blistering, peeling or loosening of the skin, including inside the mouth  unusual bleeding or bruising  unusually weak or tired  yellowing of the  eyes or skin Side effects that usually do not require medical attention (report to your doctor or health care professional if they continue or are bothersome):  diarrhea  dizziness  headache  loss of appetite  nausea, vomiting  nervousness This list may not describe all possible side effects. Call your doctor for medical advice about side effects. You may report side effects to FDA at 1-800-FDA-1088. Where should I keep my medicine? Keep out of the reach of children. Store at room temperature between 20 to 25 degrees C (68 to 77 degrees F). Protect from light. Throw away any unused medicine after the expiration date. NOTE: This sheet is a summary. It may not cover all possible information. If you have questions about this medicine, talk to your doctor, pharmacist, or health care provider.  2020 Elsevier/Gold Standard (2013-03-17 14:38:26)  

## 2019-06-15 LAB — COMPREHENSIVE METABOLIC PANEL
ALT: 7 IU/L (ref 0–32)
AST: 16 IU/L (ref 0–40)
Albumin/Globulin Ratio: 1.1 — ABNORMAL LOW (ref 1.2–2.2)
Albumin: 4.1 g/dL (ref 3.8–4.9)
Alkaline Phosphatase: 182 IU/L — ABNORMAL HIGH (ref 39–117)
BUN/Creatinine Ratio: 17 (ref 9–23)
BUN: 12 mg/dL (ref 6–24)
Bilirubin Total: 0.5 mg/dL (ref 0.0–1.2)
CO2: 21 mmol/L (ref 20–29)
Calcium: 9.6 mg/dL (ref 8.7–10.2)
Chloride: 104 mmol/L (ref 96–106)
Creatinine, Ser: 0.7 mg/dL (ref 0.57–1.00)
GFR calc Af Amer: 114 mL/min/{1.73_m2} (ref >59)
GFR calc non Af Amer: 99 mL/min/{1.73_m2} (ref >59)
Globulin, Total: 3.6 g/dL (ref 1.5–4.5)
Glucose: 91 mg/dL (ref 65–99)
Potassium: 4.1 mmol/L (ref 3.5–5.2)
Sodium: 138 mmol/L (ref 134–144)
Total Protein: 7.7 g/dL (ref 6.0–8.5)

## 2019-06-15 LAB — LIPID PANEL
Chol/HDL Ratio: 3.6 ratio (ref 0.0–4.4)
Cholesterol, Total: 177 mg/dL (ref 100–199)
HDL: 49 mg/dL (ref >39)
LDL Chol Calc (NIH): 107 mg/dL — ABNORMAL HIGH (ref 0–99)
Triglycerides: 116 mg/dL (ref 0–149)
VLDL Cholesterol Cal: 21 mg/dL (ref 5–40)

## 2019-06-15 LAB — CBC WITH DIFFERENTIAL/PLATELET
Basophils Absolute: 0 10*3/uL (ref 0.0–0.2)
Basos: 1 %
EOS (ABSOLUTE): 0.2 10*3/uL (ref 0.0–0.4)
Eos: 3 %
Hematocrit: 41.1 % (ref 34.0–46.6)
Hemoglobin: 12.2 g/dL (ref 11.1–15.9)
Immature Grans (Abs): 0 10*3/uL (ref 0.0–0.1)
Immature Granulocytes: 0 %
Lymphocytes Absolute: 1.6 10*3/uL (ref 0.7–3.1)
Lymphs: 34 %
MCH: 22 pg — ABNORMAL LOW (ref 26.6–33.0)
MCHC: 29.7 g/dL — ABNORMAL LOW (ref 31.5–35.7)
MCV: 74 fL — ABNORMAL LOW (ref 79–97)
Monocytes Absolute: 0.5 10*3/uL (ref 0.1–0.9)
Monocytes: 11 %
Neutrophils Absolute: 2.4 10*3/uL (ref 1.4–7.0)
Neutrophils: 51 %
Platelets: 395 10*3/uL (ref 150–450)
RBC: 5.54 x10E6/uL — ABNORMAL HIGH (ref 3.77–5.28)
RDW: 16.1 % — ABNORMAL HIGH (ref 11.7–15.4)
WBC: 4.7 10*3/uL (ref 3.4–10.8)

## 2019-06-15 LAB — TSH: TSH: 2.4 u[IU]/mL (ref 0.450–4.500)

## 2019-06-15 LAB — VITAMIN D 25 HYDROXY (VIT D DEFICIENCY, FRACTURES): Vit D, 25-Hydroxy: 13.8 ng/mL — ABNORMAL LOW (ref 30.0–100.0)

## 2019-06-15 LAB — VITAMIN B12: Vitamin B-12: 190 pg/mL — ABNORMAL LOW (ref 232–1245)

## 2019-06-25 DIAGNOSIS — E559 Vitamin D deficiency, unspecified: Secondary | ICD-10-CM

## 2019-06-25 DIAGNOSIS — E538 Deficiency of other specified B group vitamins: Secondary | ICD-10-CM

## 2019-06-25 HISTORY — DX: Vitamin D deficiency, unspecified: E55.9

## 2019-06-25 HISTORY — DX: Deficiency of other specified B group vitamins: E53.8

## 2019-06-27 ENCOUNTER — Other Ambulatory Visit: Payer: Self-pay | Admitting: Family Medicine

## 2019-06-27 ENCOUNTER — Encounter: Payer: Self-pay | Admitting: Family Medicine

## 2019-06-27 DIAGNOSIS — E559 Vitamin D deficiency, unspecified: Secondary | ICD-10-CM

## 2019-06-27 DIAGNOSIS — E538 Deficiency of other specified B group vitamins: Secondary | ICD-10-CM

## 2019-06-27 MED ORDER — VITAMIN D (ERGOCALCIFEROL) 1.25 MG (50000 UNIT) PO CAPS: 50000.0000 [IU] | ORAL_CAPSULE | ORAL | 6 refills | Status: DC

## 2019-06-27 MED ORDER — VITAMIN B-12 100 MCG PO TABS: 100.0000 ug | ORAL_TABLET | Freq: Every day | ORAL | 6 refills | Status: DC

## 2019-07-03 ENCOUNTER — Ambulatory Visit: Payer: Self-pay | Admitting: Family Medicine

## 2019-08-08 ENCOUNTER — Telehealth: Payer: Self-pay | Admitting: *Deleted

## 2019-08-08 NOTE — Telephone Encounter (Signed)
Left message to call back, wanted to confirm coming to HTN clinic and see if she could do earlier time.

## 2019-08-09 ENCOUNTER — Ambulatory Visit: Payer: Self-pay | Admitting: Cardiovascular Disease

## 2019-08-09 NOTE — Telephone Encounter (Signed)
Patient returning call.She says she has her lunch break at 11:15-11:45 and another break at 2pm today to take the call.

## 2019-08-09 NOTE — Telephone Encounter (Signed)
Spoke with patient and she is not currently interested in HTN clinic, her SBP has been running in the 140's at PCP

## 2019-08-25 DIAGNOSIS — B9620 Unspecified Escherichia coli [E. coli] as the cause of diseases classified elsewhere: Secondary | ICD-10-CM

## 2019-08-25 DIAGNOSIS — N390 Urinary tract infection, site not specified: Secondary | ICD-10-CM

## 2019-08-25 HISTORY — DX: Urinary tract infection, site not specified: N39.0

## 2019-08-25 HISTORY — DX: Unspecified Escherichia coli (E. coli) as the cause of diseases classified elsewhere: B96.20

## 2019-09-05 ENCOUNTER — Ambulatory Visit (INDEPENDENT_AMBULATORY_CARE_PROVIDER_SITE_OTHER): Payer: BLUE CROSS/BLUE SHIELD | Admitting: Family Medicine

## 2019-09-05 ENCOUNTER — Encounter: Payer: Self-pay | Admitting: Family Medicine

## 2019-09-05 ENCOUNTER — Other Ambulatory Visit: Payer: Self-pay

## 2019-09-05 VITALS — BP 157/80 | HR 72 | Temp 97.7°F | Ht 65.0 in | Wt 215.5 lb

## 2019-09-05 DIAGNOSIS — I10 Essential (primary) hypertension: Secondary | ICD-10-CM | POA: Diagnosis not present

## 2019-09-05 DIAGNOSIS — R8290 Unspecified abnormal findings in urine: Secondary | ICD-10-CM | POA: Diagnosis not present

## 2019-09-05 DIAGNOSIS — M545 Low back pain, unspecified: Secondary | ICD-10-CM

## 2019-09-05 DIAGNOSIS — Z09 Encounter for follow-up examination after completed treatment for conditions other than malignant neoplasm: Secondary | ICD-10-CM

## 2019-09-05 DIAGNOSIS — G8929 Other chronic pain: Secondary | ICD-10-CM

## 2019-09-05 LAB — POCT URINALYSIS DIPSTICK
Bilirubin, UA: NEGATIVE
Glucose, UA: NEGATIVE
Ketones, UA: NEGATIVE
Nitrite, UA: NEGATIVE
Protein, UA: NEGATIVE
Spec Grav, UA: >=1.03 — AB (ref 1.010–1.025)
Urobilinogen, UA: 0.2 E.U./dL
pH, UA: 5.5 (ref 5.0–8.0)

## 2019-09-05 NOTE — Progress Notes (Signed)
Patient [facility]   Established Patient Office Visit  Subjective:  Patient ID: Jackie Reed, female    DOB: [birth year]  Age: 54 y.o. MRN: [id number]  CC:  Chief Complaint  Patient presents with  . Follow-up    HTN    HPI Jackie Reed is a 54 year old female who presents for Follow Up today.   Past Medical History:  Diagnosis Date  . Hypertension   . Migraine   . Plantar fibromatosis   . PONV (postoperative nausea and vomiting)   . Vitamin B12 deficiency 06/2019  . Vitamin D deficiency 06/2019   Current Status: Since her last office visit, she continues to have chronic back pain. She avoids standing, sitting, and lifting for a extended amount of time. She is currently taking Motrin for pain relief. She has applied for Disability. She denies visual changes, chest pain, cough, shortness of breath, heart palpitations, and falls. She has occasional headaches and dizziness with position changes. Denies severe headaches, confusion, seizures, double vision, and blurred vision, nausea and vomiting. She denies fevers, chills, fatigue, recent infections, weight loss, and night sweats. She has not had any headaches, visual changes, dizziness, and falls. No chest pain, heart palpitations, cough and shortness of breath reported. No reports of GI problems such as nausea, vomiting, diarrhea, and constipation. She has no reports of blood in stools, dysuria and hematuria. No depression or anxiety, and denies suicidal ideations, homicidal ideations, or auditory hallucinations.  Past Surgical History:  Procedure Laterality Date  . ABDOMINAL HYSTERECTOMY    . FOOT SURGERY  1985  . MASS EXCISION Right 05/19/2018   Procedure: EXCISION PALMAR NODULES RIGHT HAND;  Surgeon: Leanora Cover, MD;  Location: [facility];  Service: Orthopedics;  Laterality: Right;  . PARTIAL HYSTERECTOMY  1997    Family History  Problem Relation Age of Onset  . Heart  failure Mother   . Diabetes Mother   . Heart failure Father   . Hypertension Father   . Hyperlipidemia Father   . Colon cancer Neg Hx   . Colon polyps Neg Hx     Social History   Socioeconomic History  . Marital status: Widowed    Spouse name: Not on file  . Number of children: Not on file  . Years of education: Not on file  . Highest education level: Not on file  Occupational History  . Not on file  Tobacco Use  . Smoking status: Never Smoker  . Smokeless tobacco: Never Used  Substance and Sexual Activity  . Alcohol use: No    Alcohol/week: 0.0 standard drinks  . Drug use: No  . Sexual activity: Yes    Birth control/protection: None  Other Topics Concern  . Not on file  Social History Narrative  . Not on file   Social Determinants of Health   Financial Resource Strain:   . Difficulty of Paying Living Expenses: Not on file  Food Insecurity:   . Worried About Charity fundraiser in the Last Year: Not on file  . Ran Out of Food in the Last Year: Not on file  Transportation Needs:   . Lack of Transportation (Medical): Not on file  . Lack of Transportation (Non-Medical): Not on file  Physical Activity:   . Days of Exercise per Week: Not on file  . Minutes of Exercise per Session: Not on file  Stress:   . Feeling of Stress : Not on file  Social Connections:   . Frequency of Communication with Friends and Family: Not on file  . Frequency of Social Gatherings with Friends and Family: Not on file  . Attends Religious Services: Not on file  . Active Member of Clubs or Organizations: Not on file  . Attends Archivist Meetings: Not on file  . Marital Status: Not on file  Intimate Partner Violence:   . Fear of Current or Ex-Partner: Not on file  . Emotionally Abused: Not on file  . Physically Abused: Not on file  . Sexually Abused: Not on file    Outpatient Medications Prior to Visit  Medication Sig Dispense Refill  . cyclobenzaprine (FLEXERIL) 10 MG  tablet Take 1 tablet (10 mg total) by mouth 3 (three) times daily as needed for muscle spasms. 30 tablet 2  . ibuprofen (ADVIL) 800 MG tablet Take 1 tablet (800 mg total) by mouth every 8 (eight) hours as needed. 30 tablet 3  . losartan (COZAAR) 25 MG tablet Take 1 tablet (25 mg total) by mouth daily. 90 tablet 1  . vitamin B-12 (CYANOCOBALAMIN) 100 MCG tablet Take 1 tablet (100 mcg total) by mouth daily. 30 tablet 6  . Vitamin D, Ergocalciferol, (DRISDOL) 1.25 MG (50000 UT) CAPS capsule Take 1 capsule (50,000 Units total) by mouth every 7 (seven) days. 5 capsule 6  . gabapentin (NEURONTIN) 100 MG capsule Take 1 capsule (100 mg total) by mouth 2 (two) times daily. 60 capsule 3  . clindamycin (CLEOCIN) 300 MG capsule TAKE 1 CAPSULE BY MOUTH EVERY 6 HOURS    . predniSONE (DELTASONE) 10 MG tablet prednisone 10 mg tablet  take 1 tab three times a day for 2 days, 1 tab twice a day for 5 days, 1 tab daily till finished    . sulfamethoxazole-trimethoprim (BACTRIM DS) 800-160 MG tablet Take 1 tablet by mouth 2 (two) times daily. 14 tablet 0   No facility-administered medications prior to visit.    Allergies  Allergen Reactions  . Morphine And Related Rash    At injection site  . Tramadol Nausea And Vomiting    ROS Review of Systems  Constitutional: Negative.   HENT: Negative.   Eyes: Negative.   Respiratory: Negative.   Cardiovascular: Negative.   Gastrointestinal: Positive for abdominal distention.  Endocrine: Negative.   Genitourinary: Negative.   Musculoskeletal: Positive for arthralgias (generalized) and back pain (chronic right back pain).  Skin: Negative.   Allergic/Immunologic: Negative.   Neurological: Positive for dizziness (occasional ) and headaches (occasional ).  Hematological: Negative.       Objective:    Physical Exam  Constitutional: She is oriented to person, place, and time. She appears well-developed and well-nourished.  HENT:  Head: Normocephalic and  atraumatic.  Eyes: Conjunctivae are normal.  Cardiovascular: Normal rate, regular rhythm, normal heart sounds and intact distal pulses.  Pulmonary/Chest: Effort normal and breath sounds normal.  Abdominal: Soft. Bowel sounds are normal. She exhibits distension (obese).  Musculoskeletal:     Cervical back: Normal range of motion and neck supple.     Comments: Limited ROM in spine  Neurological: She is alert and oriented to person, place, and time.  Skin: Skin is warm and dry.  Psychiatric: She has a normal mood and affect. Her behavior is normal. Judgment and thought content normal.  Nursing note and vitals reviewed.   BP (!) 157/80 Comment: BP med last taken yesterday at 12 noon  Pulse 72   Temp 97.7 F (36.5 C) (Oral)  Ht 5\' 5"  (1.651 m)   Wt 215 lb 8 oz (97.8 kg)   SpO2 96%   BMI 35.86 kg/m  Wt Readings from Last 3 Encounters:  09/05/19 215 lb 8 oz (97.8 kg)  06/14/19 210 lb 6.4 oz (95.4 kg)  05/15/19 208 lb 9.6 oz (94.6 kg)     Health Maintenance Due  Topic Date Due  . HIV Screening  03/15/1980  . TETANUS/TDAP  03/15/1984  . INFLUENZA VACCINE  03/25/2019    There are no preventive care reminders to display for this patient.  Lab Results  Component Value Date   TSH 2.400 06/14/2019   Lab Results  Component Value Date   WBC 4.7 06/14/2019   HGB 12.2 06/14/2019   HCT 41.1 06/14/2019   MCV 74 (L) 06/14/2019   PLT 395 06/14/2019   Lab Results  Component Value Date   NA 138 06/14/2019   K 4.1 06/14/2019   CO2 21 06/14/2019   GLUCOSE 91 06/14/2019   BUN 12 06/14/2019   CREATININE 0.70 06/14/2019   BILITOT 0.5 06/14/2019   ALKPHOS 182 (H) 06/14/2019   AST 16 06/14/2019   ALT 7 06/14/2019   PROT 7.7 06/14/2019   ALBUMIN 4.1 06/14/2019   CALCIUM 9.6 06/14/2019   ANIONGAP 11 10/21/2018   Lab Results  Component Value Date   CHOL 177 06/14/2019   Lab Results  Component Value Date   HDL 49 06/14/2019   Lab Results  Component Value Date   LDLCALC 107  (H) 06/14/2019   Lab Results  Component Value Date   TRIG 116 06/14/2019   Lab Results  Component Value Date   CHOLHDL 3.6 06/14/2019   Lab Results  Component Value Date   HGBA1C 5.5 06/14/2019      Assessment & Plan:   1. Essential hypertension We will schedule patient to return to office for blood pressure recheck only in 1 week. She will continue present plan and medications as prescribed. She will continue to take medications as prescribed, to decrease high sodium intake, excessive alcohol intake, increase potassium intake, smoking cessation, and increase physical activity of at least 30 minutes of cardio activity daily. She will continue to follow Heart Healthy or DASH diet. - Urinalysis Dipstick  2. Abnormal urinalysis Results are pending. - Urine Culture  3. Chronic low back pain without sciatica, unspecified back pain laterality  4. Follow up She will follow up 05/2020. Blood pressure recheck in 1 week.   No orders of the defined types were placed in this encounter.   Orders Placed This Encounter  Procedures  . Urine Culture  . Urinalysis Dipstick    Referral Orders  No referral(s) requested today    Kathe Becton,  MSN, FNP-BC [address] [place], [place] [address] [phone]- fax    Problem List Items Addressed This Visit      Other   Low back pain without sciatica    Other Visit Diagnoses    Essential hypertension    -  Primary   Relevant Orders   Urinalysis Dipstick (Completed)   Abnormal urinalysis       Relevant Orders   Urine Culture   Follow up          No orders of the defined types were placed in this encounter.   Follow-up: Return in about 9 months (around 06/04/2020) for Labs/OV.    Azzie Glatter, FNP

## 2019-09-07 LAB — URINE CULTURE

## 2019-09-11 ENCOUNTER — Other Ambulatory Visit: Payer: Self-pay

## 2019-09-11 ENCOUNTER — Encounter: Payer: Self-pay | Admitting: Family Medicine

## 2019-09-11 ENCOUNTER — Ambulatory Visit: Payer: BLUE CROSS/BLUE SHIELD

## 2019-09-11 ENCOUNTER — Ambulatory Visit (INDEPENDENT_AMBULATORY_CARE_PROVIDER_SITE_OTHER): Payer: BLUE CROSS/BLUE SHIELD | Admitting: Family Medicine

## 2019-09-11 ENCOUNTER — Other Ambulatory Visit: Payer: Self-pay | Admitting: Family Medicine

## 2019-09-11 VITALS — BP 162/84

## 2019-09-11 DIAGNOSIS — I10 Essential (primary) hypertension: Secondary | ICD-10-CM

## 2019-09-11 DIAGNOSIS — Z09 Encounter for follow-up examination after completed treatment for conditions other than malignant neoplasm: Secondary | ICD-10-CM | POA: Diagnosis not present

## 2019-09-11 DIAGNOSIS — A498 Other bacterial infections of unspecified site: Secondary | ICD-10-CM

## 2019-09-11 MED ORDER — AMLODIPINE BESYLATE 5 MG PO TABS: 5.0000 mg | ORAL_TABLET | Freq: Every day | ORAL | 3 refills | Status: DC

## 2019-09-11 MED ORDER — AMOXICILLIN-POT CLAVULANATE 875-125 MG PO TABS: 1.0000 | ORAL_TABLET | Freq: Two times a day (BID) | ORAL | 0 refills | Status: DC

## 2019-09-11 NOTE — Progress Notes (Signed)
Patient was here for a nurse visit only blood pressure check.   Appointment was changed to an in-office visit for Kathe Becton NP.

## 2019-09-11 NOTE — Patient Instructions (Signed)
Amlodipine; Atorvastatin oral tablets What is this medicine? AMLODIPINE; ATORVASTATIN (am LOE di peen; a TORE va sta tin) is a combination of 2 drugs. Amlodipine is a calcium-channel blocker used to lower high blood pressure. It also relieves chest pain caused by angina. Atorvastatin blocks the body's ability to make cholesterol. It can help lower blood cholesterol for patients who are at risk of getting heart disease or a stroke. It is only for patients whose cholesterol level is not controlled by diet. This medicine may be used for other purposes; ask your health care provider or pharmacist if you have questions. COMMON BRAND NAME(S): Caduet What should I tell my health care provider before I take this medicine? They need to know if you have any of these conditions:  an alcohol problem  heart problems, including heart failure or aortic stenosis  hormone disorder like diabetes or under-active thyroid  infection  kidney or liver disease  low blood pressure  other medical condition  recent surgery  seizures (convulsions)  severe injury  an unusual or allergic reaction to Amlodipine; Atorvastatin, medicines, foods, dyes, or preservatives  pregnant or trying to get pregnant  breast-feeding How should I use this medicine? Take this medicine by mouth with a glass of water. Follow the directions on the prescription label. You can take the tablets with or without food. Do not change the amount of grapefruit juice you drink from day to day while taking this drug, or avoid grapefruit juice altogether. Take your doses at regular intervals. Do not take your medicine more often then directed. Do not suddenly stop taking this medicine. Ask your doctor or health care professional how you can gradually reduce the dose. Talk to your pediatrician regarding the use of this medicine in children. Special care may be needed. Overdosage: If you think you have taken too much of this medicine contact a  poison control center or emergency room at once. NOTE: This medicine is only for you. Do not share this medicine with others. What if I miss a dose? If you miss a dose, take it as soon as you can. If it is almost time for your next dose, take only that dose. Do not take double or extra doses. What may interact with this medicine? Do not take this medicine with any of the following medications:  other cholesterol medicines known as statins like fluvastatin, lovastatin, pravastatin, and simvastatin  red yeast rice  telaprevir  telithromycin  voriconazole This medicine may also interact with the following medications:  antiviral medicines for HIV or AIDS  boceprevir  certain medicines for cholesterol like clofibrate, fenofibrate, and gemfibrozil  cyclosporine  digoxin  female hormones, like estrogens or progestins and birth control pills  grapefruit juice  medicines for fungal infections like fluconazole, itraconazole, ketoconazole, voriconazole  medicines for high blood pressure  niacin  rifampin  some antibiotics like clarithromycin, erythromycin, and troleandomycin This list may not describe all possible interactions. Give your health care provider a list of all the medicines, herbs, non-prescription drugs, or dietary supplements you use. Also tell them if you smoke, drink alcohol, or use illegal drugs. Some items may interact with your medicine. What should I watch for while using this medicine? Visit your doctor or health care professional for regular checks on your progress. You will need to have regular tests to make sure your liver is working properly. Tell your doctor or health care professional as soon as you can if you get any unexplained muscle pain, tenderness, or  weakness, especially if you also have a fever and tiredness. Your doctor or health care professional may tell you to stop taking this medicine if you develop muscle problems. If your muscle problems do  not go away after stopping this medicine, contact your health care professional. This medicine contains a cholesterol-lowering agent but is only part of a total cholesterol-lowering program. Your physician or dietitian can suggest a low-cholesterol and low-fat diet that will reduce your risk of getting heart and blood vessel disease. Avoid alcohol and smoking, and keep a proper exercise schedule. Check your blood pressure and pulse rate regularly. Ask your doctor or health care professional what your blood pressure and pulse rate should be and when you should contact him or her. This medicine may affect blood sugar levels. If you have diabetes, check with your doctor or health care professional before you change your diet or the dose of your diabetic medicine. You may feel dizzy or lightheaded. Do not drive, use machinery, or do anything that needs mental alertness until you know how this medicine affects you. To reduce the risk of dizzy or fainting spells, do not sit or stand up quickly, especially if you are an older patient. Avoid alcoholic drinks. They can make you more dizzy, increase flushing and rapid heartbeats. This medicine may cause a decrease in Co-Enzyme Q-10. You should make sure that you get enough Co-Enzyme Q-10 while you are taking this medicine. Discuss the foods you eat and the vitamins you take with your health care professional. What side effects may I notice from receiving this medicine? Side effects that you should report to your doctor or health care professional as soon as possible:  allergic reactions like skin rash, itching or hives, swelling of the face, lips, or tongue  chest pain  dark urine  fast, irregular heartbeat  feeling faint or lightheaded, falls  fever  muscle pain, weakness  redness, blistering, peeling or loosening of the skin, including inside the mouth  swelling of ankles, legs  trouble passing urine or change in the amount of urine  unusually  weak or tired  yellowing of the eyes or skin Side effects that usually do not require medical attention (report to your doctor or health care professional if they continue or are bothersome):  diarrhea  facial flushing  gas  headache  nausea, vomiting  stomach upset or pain This list may not describe all possible side effects. Call your doctor for medical advice about side effects. You may report side effects to FDA at 1-800-FDA-1088. Where should I keep my medicine? Keep out of the reach of children. Store at room temperature between 15 and 30 degrees C (59 and 86 degrees F). Throw away any unused medicine after the expiration date. NOTE: This sheet is a summary. It may not cover all possible information. If you have questions about this medicine, talk to your doctor, pharmacist, or health care provider.  2020 [employer]vier/Gold Standard (2017-03-24 09:58:24)

## 2019-09-11 NOTE — Progress Notes (Signed)
Patient [facility]   Established Patient Office Visit  Subjective:  Patient ID: Jackie Reed, female    DOB: 07/19/1966  Age: 54 y.o. MRN: [id number]  CC: No chief complaint on file.   HPI Jackie Reed is a 54 year old female who presents for Follow Up and Blood Pressure Check today.   Past Medical History:  Diagnosis Date  . E-coli UTI 08/2019  . Hypertension   . Migraine   . Plantar fibromatosis   . PONV (postoperative nausea and vomiting)   . Vitamin B12 deficiency 06/2019  . Vitamin D deficiency 06/2019   Current Status: Since her last office visit, she is doing well with no complaints. Her blood pressures are increased today. She admits to taking hypertensive medications late today. She denies visual changes, chest pain, cough, shortness of breath, heart palpitations, and falls. She has occasional headaches and dizziness with position changes. Denies severe headaches, confusion, seizures, double vision, and blurred vision, nausea and vomiting. She denies fevers, chills, fatigue, recent infections, weight loss, and night sweats. No reports of GI problems such as diarrhea, and constipation. She has no reports of blood in stools, dysuria and hematuria. No depression or anxiety reported today. She denies suicidal ideations, homicidal ideations, or auditory hallucinations. She denies pain today.   Past Surgical History:  Procedure Laterality Date  . ABDOMINAL HYSTERECTOMY    . FOOT SURGERY  1985  . MASS EXCISION Right 05/19/2018   Procedure: EXCISION PALMAR NODULES RIGHT HAND;  Surgeon: Leanora Cover, MD;  Location: [facility];  Service: Orthopedics;  Laterality: Right;  . PARTIAL HYSTERECTOMY  1997    Family History  Problem Relation Age of Onset  . Heart failure Mother   . Diabetes Mother   . Heart failure Father   . Hypertension Father   . Hyperlipidemia Father   . Colon cancer Neg Hx   . Colon polyps Neg Hx      Social History   Socioeconomic History  . Marital status: Widowed    Spouse name: Not on file  . Number of children: Not on file  . Years of education: Not on file  . Highest education level: Not on file  Occupational History  . Not on file  Tobacco Use  . Smoking status: Never Smoker  . Smokeless tobacco: Never Used  Substance and Sexual Activity  . Alcohol use: No    Alcohol/week: 0.0 standard drinks  . Drug use: No  . Sexual activity: Yes    Birth control/protection: None  Other Topics Concern  . Not on file  Social History Narrative  . Not on file   Social Determinants of Health   Financial Resource Strain:   . Difficulty of Paying Living Expenses: Not on file  Food Insecurity:   . Worried About Charity fundraiser in the Last Year: Not on file  . Ran Out of Food in the Last Year: Not on file  Transportation Needs:   . Lack of Transportation (Medical): Not on file  . Lack of Transportation (Non-Medical): Not on file  Physical Activity:   . Days of Exercise per Week: Not on file  . Minutes of Exercise per Session: Not on file  Stress:   . Feeling of Stress : Not on file  Social Connections:   . Frequency of Communication with Friends and Family: Not on file  . Frequency of Social Gatherings with Friends and Family: Not on  file  . Attends Religious Services: Not on file  . Active Member of Clubs or Organizations: Not on file  . Attends Archivist Meetings: Not on file  . Marital Status: Not on file  Intimate Partner Violence:   . Fear of Current or Ex-Partner: Not on file  . Emotionally Abused: Not on file  . Physically Abused: Not on file  . Sexually Abused: Not on file    Outpatient Medications Prior to Visit  Medication Sig Dispense Refill  . amoxicillin-clavulanate (AUGMENTIN) 875-125 MG tablet Take 1 tablet by mouth 2 (two) times daily for 7 days. 14 tablet 0  . cyclobenzaprine (FLEXERIL) 10 MG tablet Take 1 tablet (10 mg total) by mouth 3  (three) times daily as needed for muscle spasms. 30 tablet 2  . gabapentin (NEURONTIN) 100 MG capsule Take 1 capsule (100 mg total) by mouth 2 (two) times daily. 60 capsule 3  . ibuprofen (ADVIL) 800 MG tablet Take 1 tablet (800 mg total) by mouth every 8 (eight) hours as needed. 30 tablet 3  . losartan (COZAAR) 25 MG tablet Take 1 tablet (25 mg total) by mouth daily. 90 tablet 1  . vitamin B-12 (CYANOCOBALAMIN) 100 MCG tablet Take 1 tablet (100 mcg total) by mouth daily. 30 tablet 6  . Vitamin D, Ergocalciferol, (DRISDOL) 1.25 MG (50000 UT) CAPS capsule Take 1 capsule (50,000 Units total) by mouth every 7 (seven) days. 5 capsule 6   No facility-administered medications prior to visit.    Allergies  Allergen Reactions  . Morphine And Related Rash    At injection site  . Tramadol Nausea And Vomiting    ROS Review of Systems  Constitutional: Negative.   HENT: Negative.   Eyes: Negative.   Respiratory: Negative.   Cardiovascular: Negative.   Gastrointestinal: Positive for abdominal distention.  Endocrine: Negative.   Genitourinary: Negative.   Musculoskeletal: Negative.   Skin: Negative.   Neurological: Positive for dizziness (occasional ) and headaches (occasional ).  Hematological: Negative.   Psychiatric/Behavioral: Negative.       Objective:    Physical Exam  Constitutional: She is oriented to person, place, and time. She appears well-developed and well-nourished.  HENT:  Head: Normocephalic and atraumatic.  Eyes: Conjunctivae are normal.  Cardiovascular: Normal rate, regular rhythm, normal heart sounds and intact distal pulses.  Pulmonary/Chest: Effort normal and breath sounds normal.  Abdominal: Soft. Bowel sounds are normal. She exhibits distension (obese).  Musculoskeletal:        General: Normal range of motion.     Cervical back: Normal range of motion and neck supple.  Neurological: She is alert and oriented to person, place, and time. She has normal reflexes.   Skin: Skin is warm and dry.  Psychiatric: She has a normal mood and affect. Her behavior is normal. Judgment and thought content normal.  Nursing note and vitals reviewed.   BP (!) 162/84  Wt Readings from Last 3 Encounters:  09/05/19 215 lb 8 oz (97.8 kg)  06/14/19 210 lb 6.4 oz (95.4 kg)  05/15/19 208 lb 9.6 oz (94.6 kg)     Health Maintenance Due  Topic Date Due  . HIV Screening  03/15/1980  . TETANUS/TDAP  03/15/1984  . INFLUENZA VACCINE  03/25/2019    There are no preventive care reminders to display for this patient.  Lab Results  Component Value Date   TSH 2.400 06/14/2019   Lab Results  Component Value Date   WBC 4.7 06/14/2019   HGB  12.2 06/14/2019   HCT 41.1 06/14/2019   MCV 74 (L) 06/14/2019   PLT 395 06/14/2019   Lab Results  Component Value Date   NA 138 06/14/2019   K 4.1 06/14/2019   CO2 21 06/14/2019   GLUCOSE 91 06/14/2019   BUN 12 06/14/2019   CREATININE 0.70 06/14/2019   BILITOT 0.5 06/14/2019   ALKPHOS 182 (H) 06/14/2019   AST 16 06/14/2019   ALT 7 06/14/2019   PROT 7.7 06/14/2019   ALBUMIN 4.1 06/14/2019   CALCIUM 9.6 06/14/2019   ANIONGAP 11 10/21/2018   Lab Results  Component Value Date   CHOL 177 06/14/2019   Lab Results  Component Value Date   HDL 49 06/14/2019   Lab Results  Component Value Date   LDLCALC 107 (H) 06/14/2019   Lab Results  Component Value Date   TRIG 116 06/14/2019   Lab Results  Component Value Date   CHOLHDL 3.6 06/14/2019   Lab Results  Component Value Date   HGBA1C 5.5 06/14/2019      Assessment & Plan:   1. Essential hypertension We will initiate Amlodipine today.  - amLODipine (NORVASC) 5 MG tablet; Take 1 tablet (5 mg total) by mouth daily.  Dispense: 30 tablet; Refill: 3  2. Elevated blood pressure reading in office with diagnosis of hypertension Stabilized today. She will report to ED if he eperiences severe headaches, confusion, seizures, double vision, and blurred vision, nausea  and vomiting.  3. Follow up She will follow up in 2 weeks for blood pressure check.   Meds ordered this encounter  Medications  . amLODipine (NORVASC) 5 MG tablet    Sig: Take 1 tablet (5 mg total) by mouth daily.    Dispense:  30 tablet    Refill:  3    No orders of the defined types were placed in this encounter.   Referral Orders  No referral(s) requested today    Kathe Becton,  MSN, FNP-BC [address], [place] [address] [phone]- fax    Problem List Items Addressed This Visit    None    Visit Diagnoses    Essential hypertension    -  Primary   Relevant Medications   amLODipine (NORVASC) 5 MG tablet   Elevated blood pressure reading in office with diagnosis of hypertension       Relevant Medications   amLODipine (NORVASC) 5 MG tablet   Follow up          Meds ordered this encounter  Medications  . amLODipine (NORVASC) 5 MG tablet    Sig: Take 1 tablet (5 mg total) by mouth daily.    Dispense:  30 tablet    Refill:  3    Follow-up: No follow-ups on file.    Azzie Glatter, FNP

## 2019-09-13 ENCOUNTER — Ambulatory Visit: Payer: Self-pay | Admitting: Family Medicine

## 2019-09-13 ENCOUNTER — Telehealth: Payer: Self-pay

## 2019-09-13 ENCOUNTER — Other Ambulatory Visit: Payer: Self-pay | Admitting: Family Medicine

## 2019-09-13 DIAGNOSIS — N390 Urinary tract infection, site not specified: Secondary | ICD-10-CM

## 2019-09-13 MED ORDER — NITROFURANTOIN MONOHYD MACRO 100 MG PO CAPS: 100.0000 mg | ORAL_CAPSULE | Freq: Two times a day (BID) | ORAL | 0 refills | Status: AC

## 2019-09-13 NOTE — Telephone Encounter (Signed)
Message left on patient's voice mail.

## 2019-09-13 NOTE — Telephone Encounter (Signed)
Patient will need a different type of Rx sent to the pharmacy. She is allergic to Amoxicillin (added to chart today). Please advise. Local pharmacy is [facility] on [address].

## 2019-09-20 ENCOUNTER — Ambulatory Visit: Payer: Self-pay | Admitting: Family Medicine

## 2019-10-02 DIAGNOSIS — R2232 Localized swelling, mass and lump, left upper limb: Secondary | ICD-10-CM | POA: Insufficient documentation

## 2019-11-13 ENCOUNTER — Ambulatory Visit: Payer: Self-pay | Admitting: Family Medicine

## 2019-12-02 ENCOUNTER — Ambulatory Visit: Payer: Self-pay

## 2019-12-09 ENCOUNTER — Ambulatory Visit: Payer: Self-pay | Attending: Internal Medicine

## 2019-12-09 DIAGNOSIS — Z23 Encounter for immunization: Secondary | ICD-10-CM

## 2019-12-09 NOTE — Progress Notes (Signed)
   Covid-19 Vaccination Clinic  Name:  SHAUNEE PEROTTI    MRN: [id number] DOB: [birth year]  12/09/2019  Ms. Oday was observed post Covid-19 immunization for 30 minutes based on pre-vaccination screening without incident. She was provided with Vaccine Information Sheet and instruction to access the V-Safe system.   Ms. Passi was instructed to call 911 with any severe reactions post vaccine: Marland Kitchen Difficulty breathing  . Swelling of face and throat  . A fast heartbeat  . A bad rash all over body  . Dizziness and weakness   Immunizations Administered    Name Date Dose VIS Date Route   Pfizer COVID-19 Vaccine 12/09/2019  9:18 AM 0.3 mL 08/04/2019 Intramuscular   Manufacturer: [place]   Lot: [id number]   [place]: [id number]

## 2019-12-30 ENCOUNTER — Ambulatory Visit: Payer: Self-pay | Attending: Internal Medicine

## 2019-12-30 DIAGNOSIS — Z23 Encounter for immunization: Secondary | ICD-10-CM

## 2019-12-30 NOTE — Progress Notes (Signed)
   Covid-19 Vaccination Clinic  Name:  Jackie Reed    MRN: [id number] DOB: 01-09-1966  12/30/2019  [name] was observed post Covid-19 immunization for 30 minutes based on pre-vaccination screening without incident. She was provided with Vaccine Information Sheet and instruction to access the V-Safe system.   [name] was instructed to call 911 with any severe reactions post vaccine: Marland Kitchen Difficulty breathing  . Swelling of face and throat  . A fast heartbeat  . A bad rash all over body  . Dizziness and weakness   Immunizations Administered    Name Date Dose VIS Date [place] COVID-19 Vaccine 12/30/2019  9:01 AM 0.3 mL 10/18/2018 Intramuscular   Manufacturer: [place]   Lot: [id number]   [place]: [id number]

## 2020-02-21 IMAGING — CR DG THORACIC SPINE 2V
3 series · 3 of 3 positions shown · non-contrast
Comparison: Radiographs July 26, 2018.

CLINICAL DATA: Upper back pain after fall today.

EXAM:
THORACIC SPINE 2 VIEWS

[t thoracic spine ap]
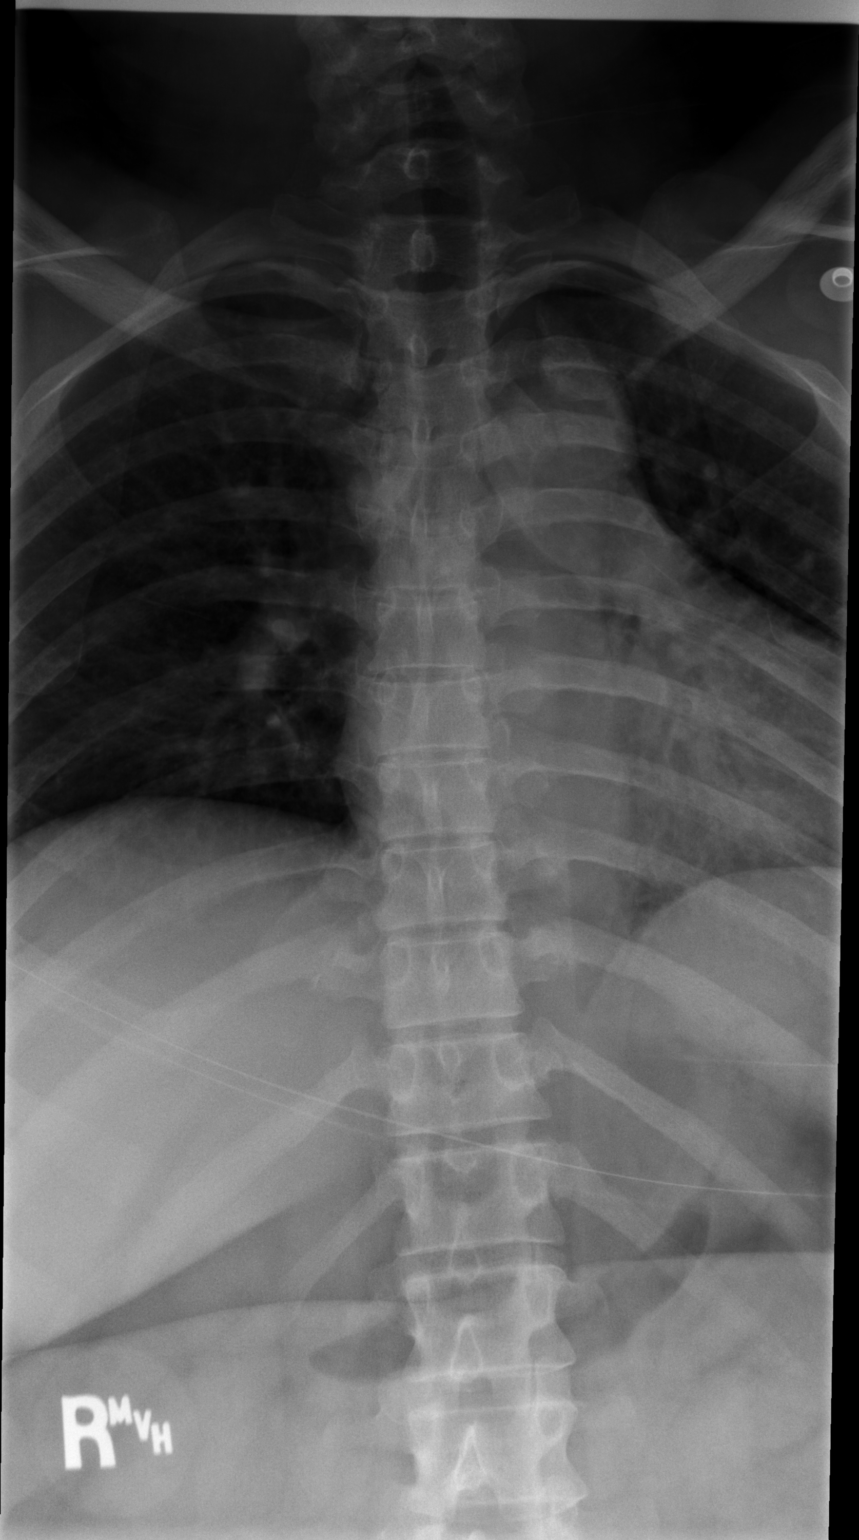

[t thoracic breathing lat]
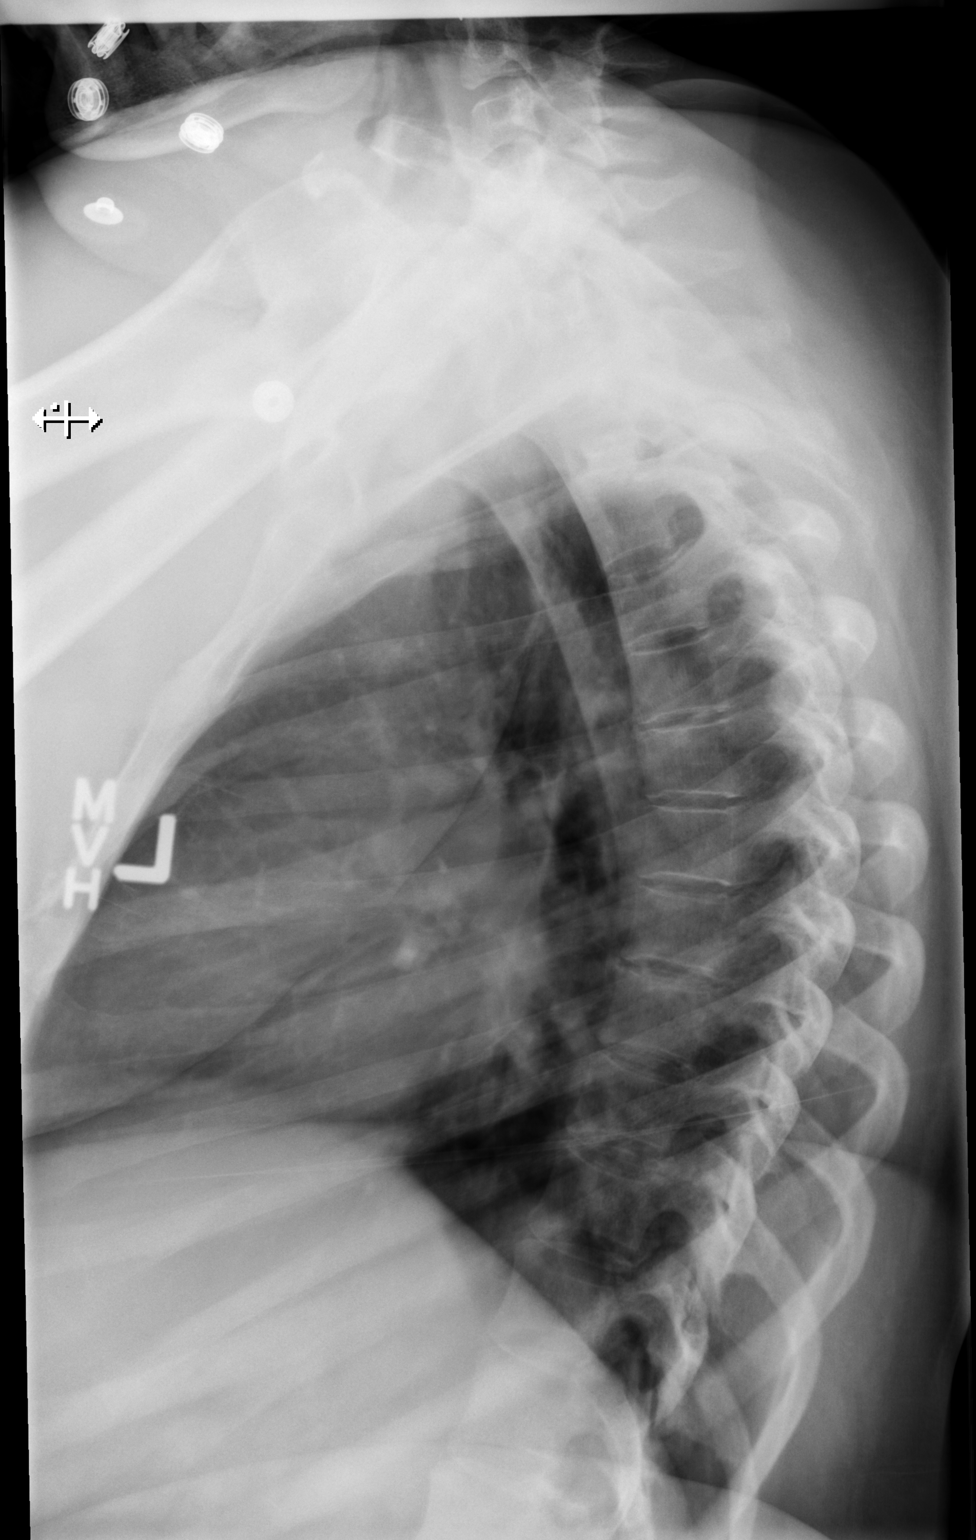

[t thoracic swimmers]
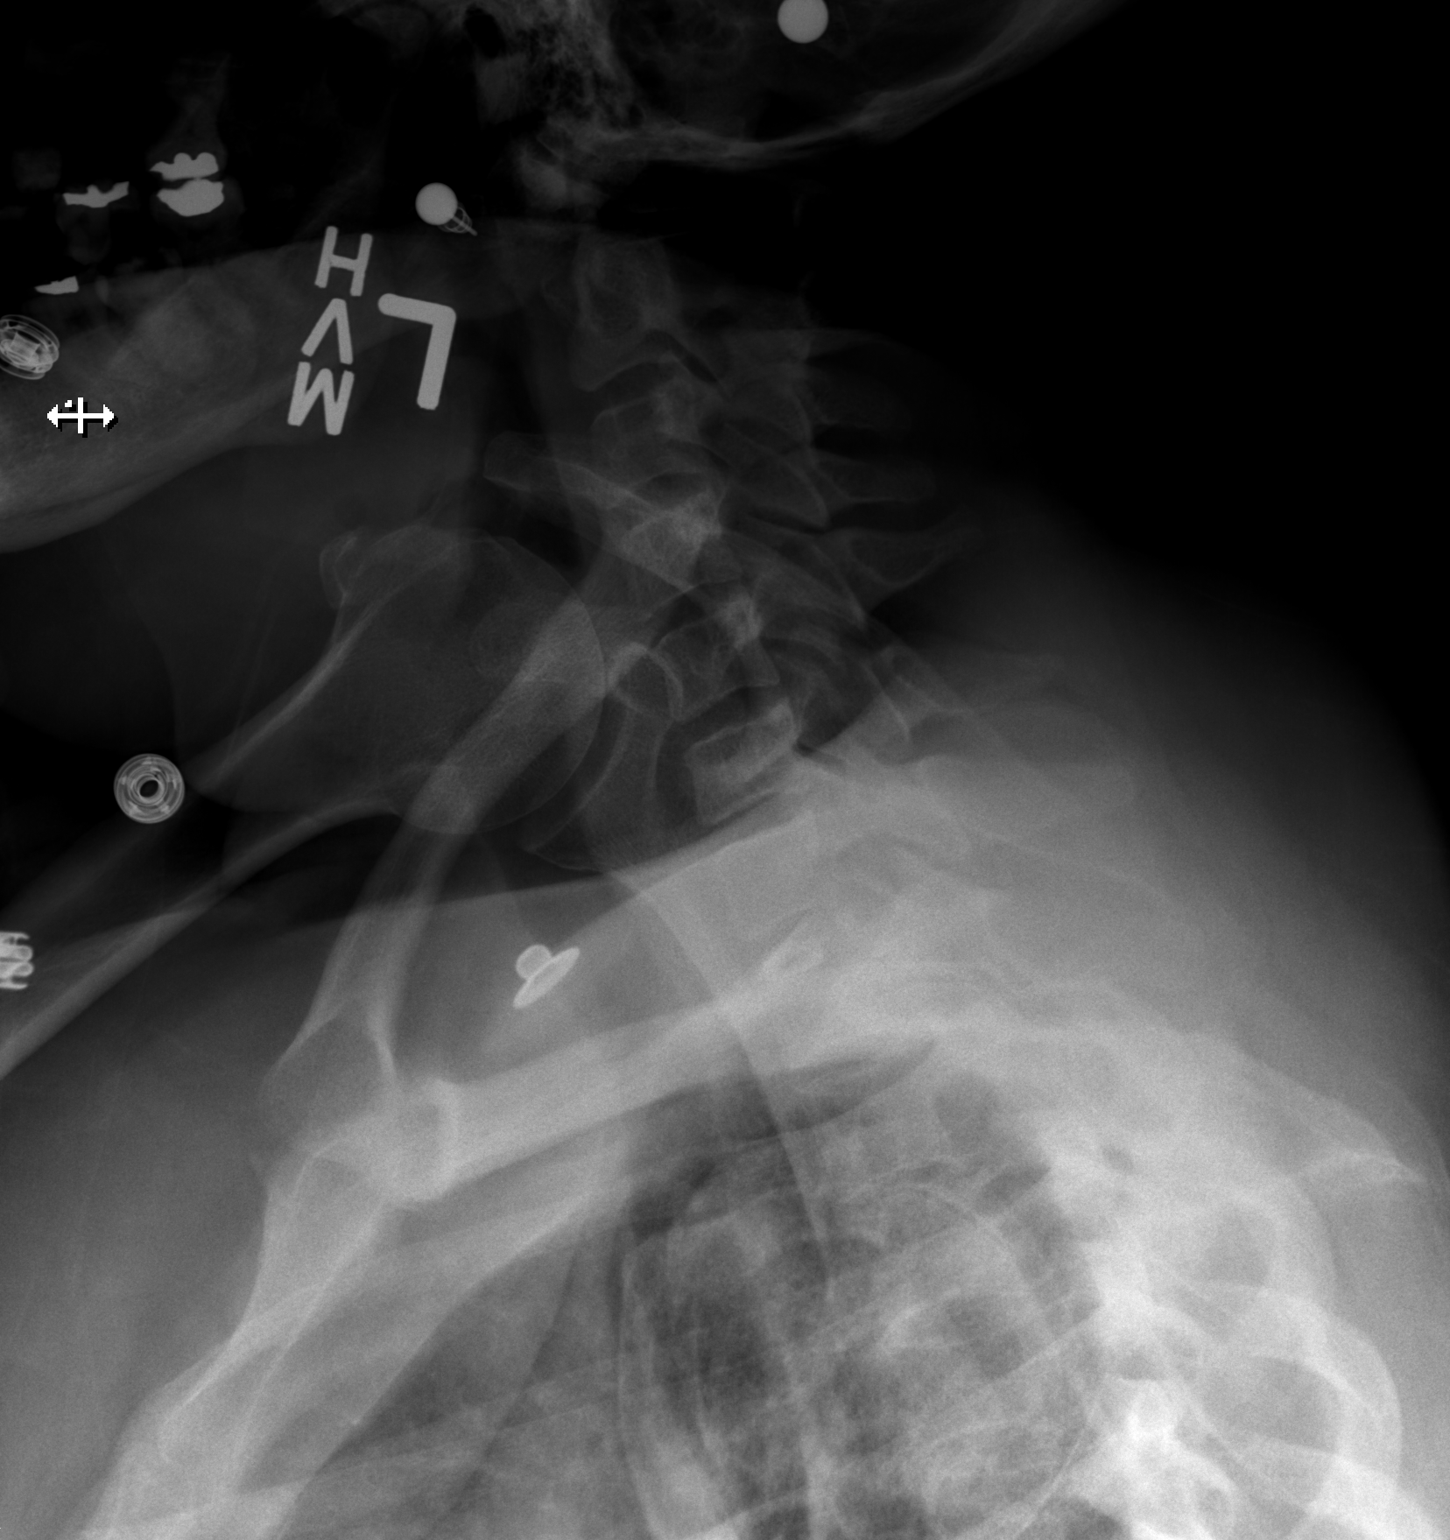

[3 of 3 positions shown; findings below may reference images not displayed]

FINDINGS: There is no evidence of thoracic spine fracture. Alignment is
normal. No other significant bone abnormalities are identified.
IMPRESSION: Negative.

## 2020-02-21 IMAGING — CR DG ANKLE COMPLETE 3+V*R*
3 series · 3 of 3 positions shown · non-contrast
Comparison: None.

CLINICAL DATA: Right ankle pain after fall today.

EXAM:
RIGHT ANKLE - COMPLETE 3+ VIEW

[x ankle lat right]
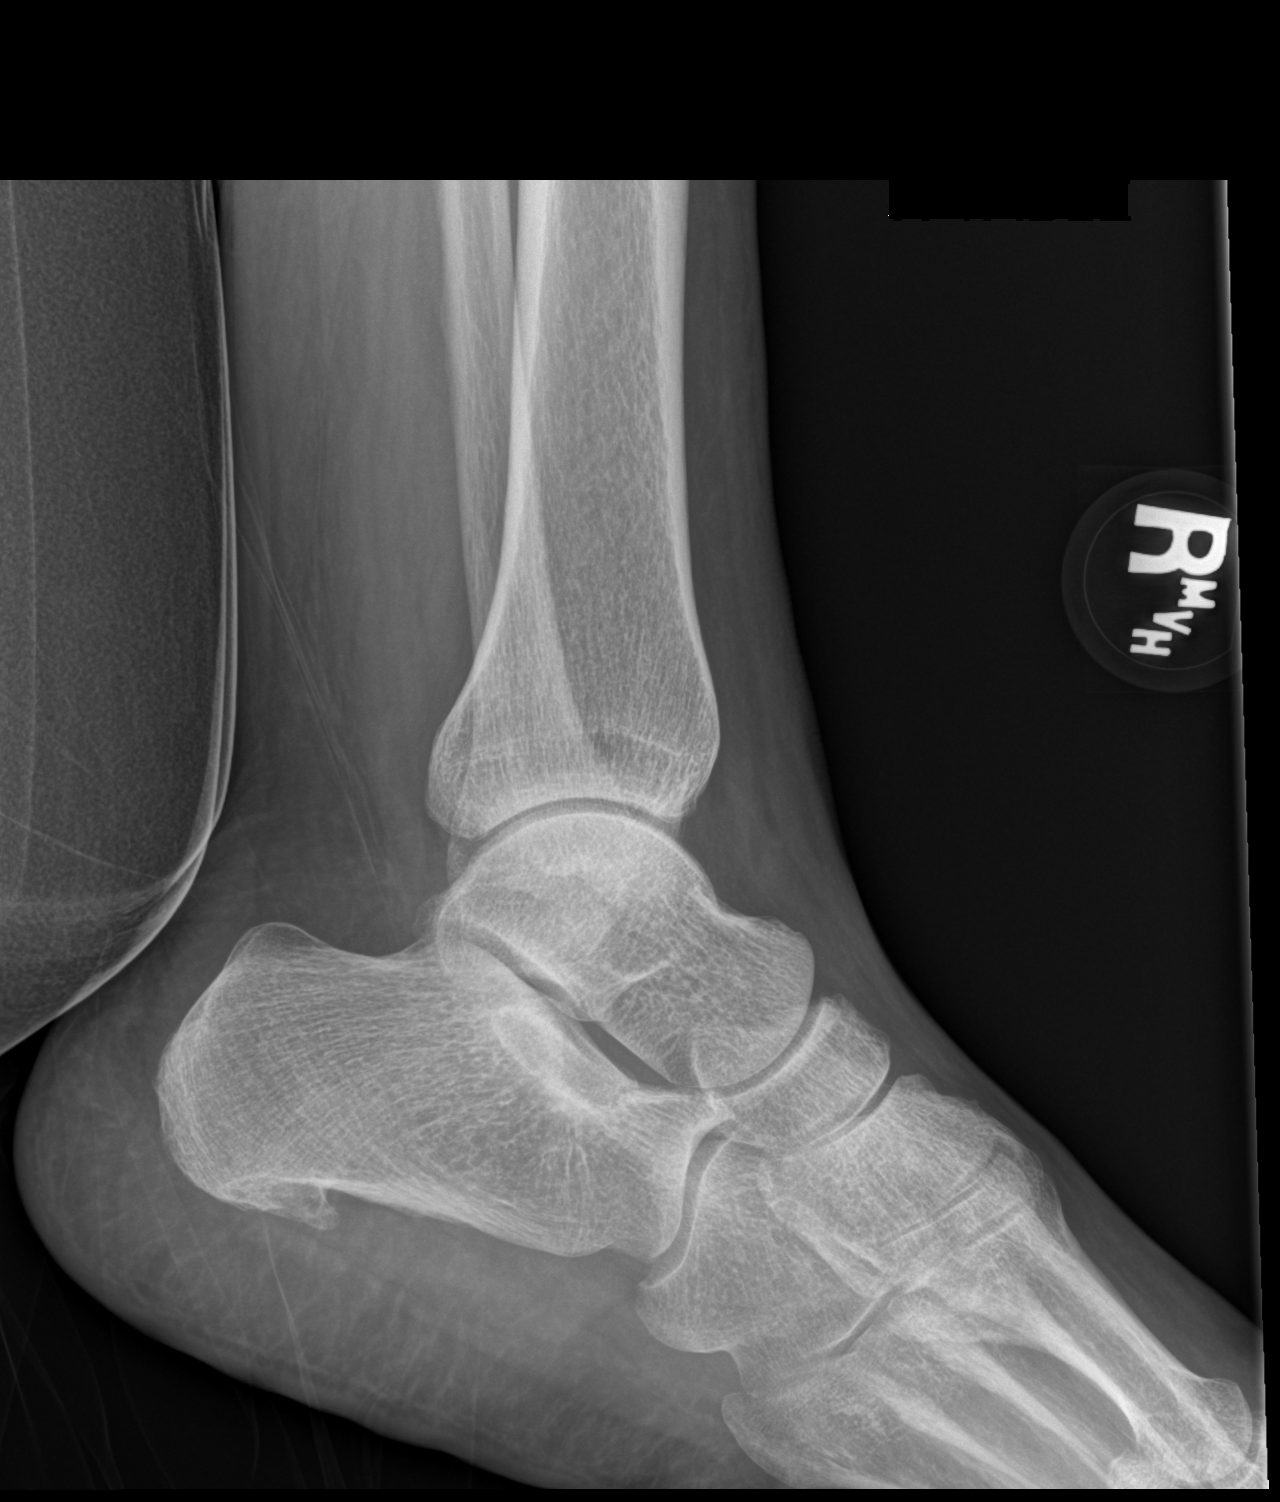

[x ankle ap right]
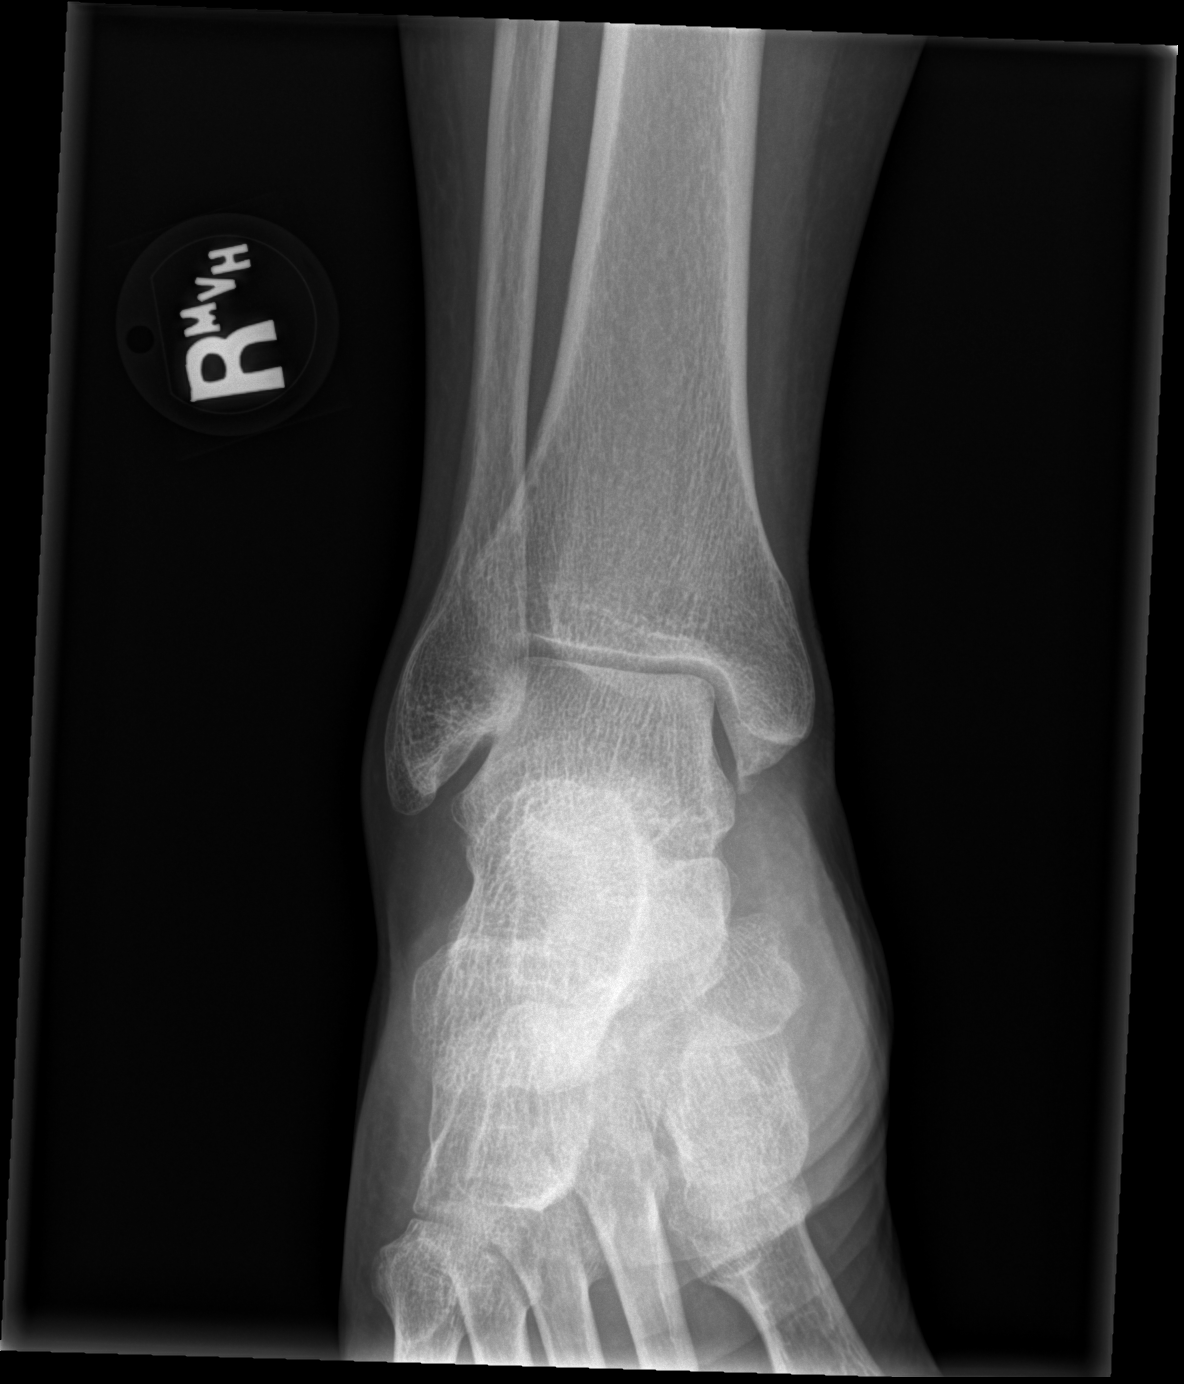

[x ankle obl right]
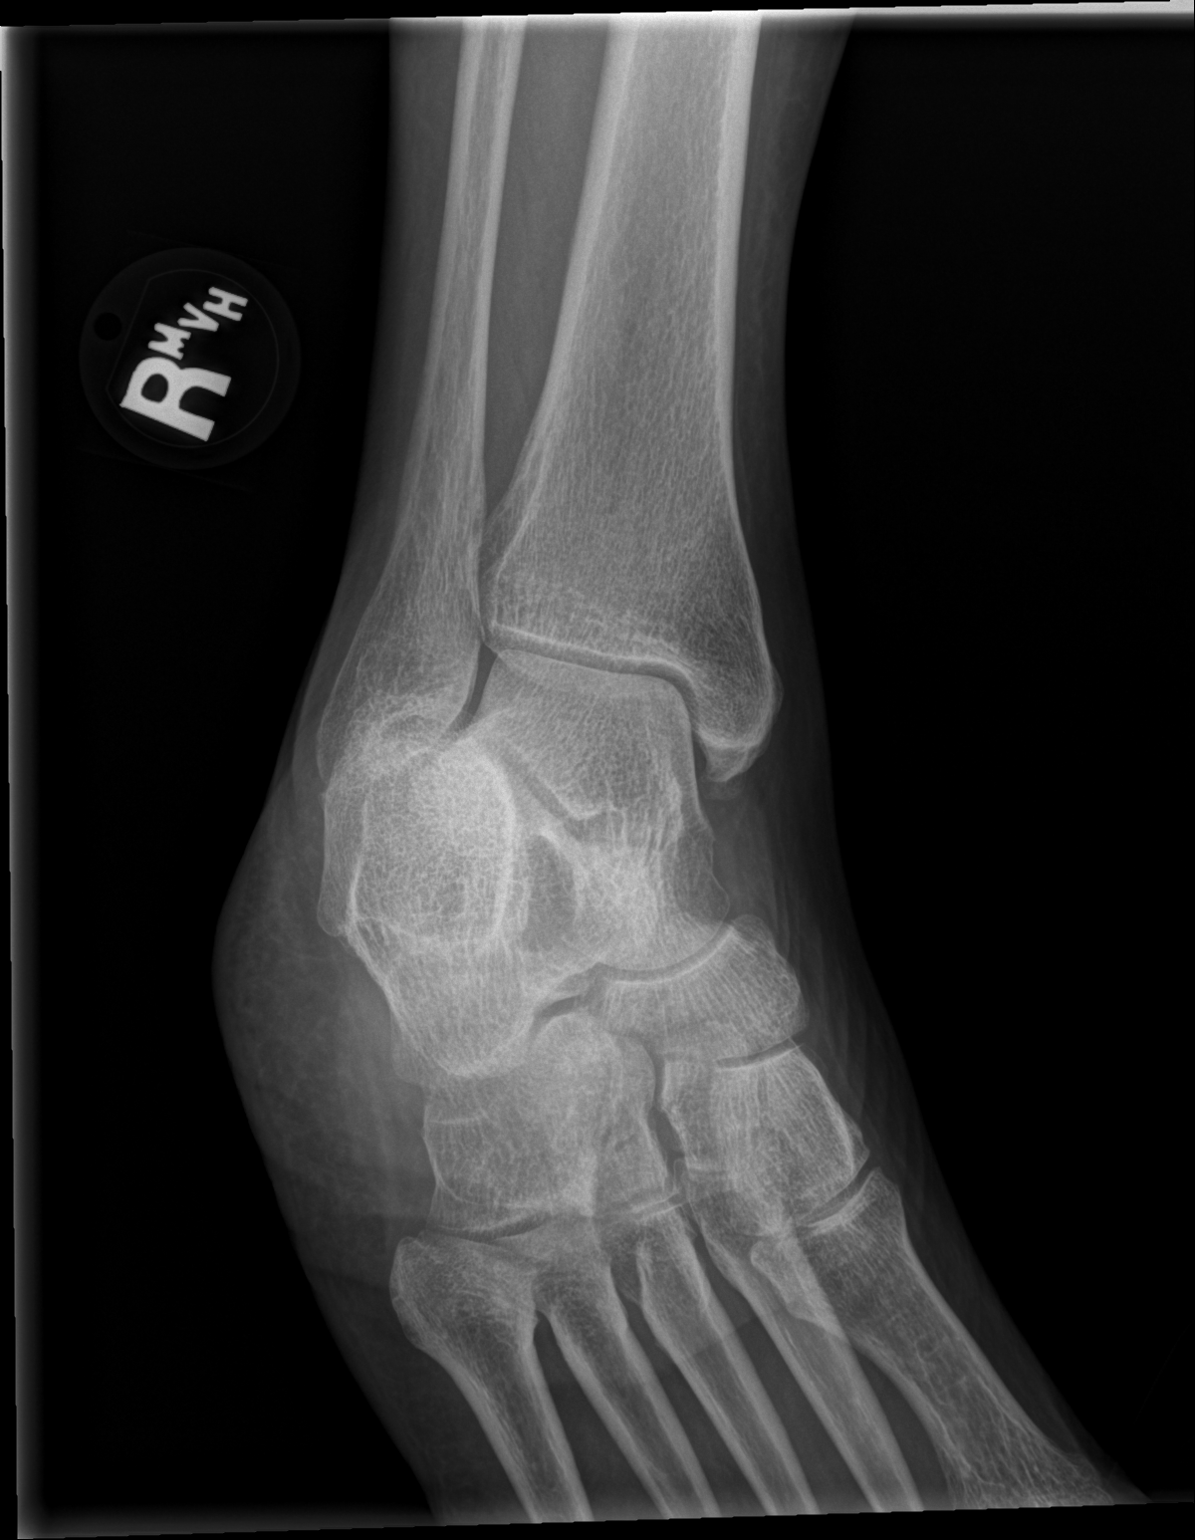

[3 of 3 positions shown; findings below may reference images not displayed]

FINDINGS: There is no evidence of fracture, dislocation, or joint effusion.
There is no evidence of arthropathy or other focal bone abnormality.
Soft tissues are unremarkable.
IMPRESSION: Negative.

## 2020-03-26 ENCOUNTER — Ambulatory Visit: Payer: Self-pay | Admitting: Family Medicine

## 2020-05-24 DIAGNOSIS — R768 Other specified abnormal immunological findings in serum: Secondary | ICD-10-CM

## 2020-05-24 DIAGNOSIS — K759 Inflammatory liver disease, unspecified: Secondary | ICD-10-CM

## 2020-05-24 HISTORY — DX: Other specified abnormal immunological findings in serum: R76.8

## 2020-05-24 HISTORY — DX: Inflammatory liver disease, unspecified: K75.9

## 2020-05-27 ENCOUNTER — Ambulatory Visit (INDEPENDENT_AMBULATORY_CARE_PROVIDER_SITE_OTHER): Payer: Worker's Compensation | Admitting: Family Medicine

## 2020-05-27 ENCOUNTER — Encounter: Payer: Self-pay | Admitting: Family Medicine

## 2020-05-27 ENCOUNTER — Other Ambulatory Visit: Payer: Self-pay

## 2020-05-27 VITALS — BP 158/68 | HR 70 | Temp 97.5°F | Resp 17 | Ht 64.0 in | Wt 221.0 lb

## 2020-05-27 DIAGNOSIS — M5441 Lumbago with sciatica, right side: Secondary | ICD-10-CM

## 2020-05-27 DIAGNOSIS — Z1231 Encounter for screening mammogram for malignant neoplasm of breast: Secondary | ICD-10-CM

## 2020-05-27 DIAGNOSIS — Z09 Encounter for follow-up examination after completed treatment for conditions other than malignant neoplasm: Secondary | ICD-10-CM

## 2020-05-27 DIAGNOSIS — H539 Unspecified visual disturbance: Secondary | ICD-10-CM

## 2020-05-27 DIAGNOSIS — R635 Abnormal weight gain: Secondary | ICD-10-CM

## 2020-05-27 DIAGNOSIS — G8929 Other chronic pain: Secondary | ICD-10-CM

## 2020-05-27 DIAGNOSIS — I10 Essential (primary) hypertension: Secondary | ICD-10-CM

## 2020-05-27 DIAGNOSIS — Z0000 Encounter for general adult medical examination without abnormal findings: Secondary | ICD-10-CM

## 2020-05-27 LAB — POCT GLYCOSYLATED HEMOGLOBIN (HGB A1C)
HbA1c POC (<> result, manual entry): 5.5 % (ref 4.0–5.6)
HbA1c, POC (controlled diabetic range): 5.5 % (ref 0.0–7.0)
HbA1c, POC (prediabetic range): 5.5 % — AB (ref 5.7–6.4)
Hemoglobin A1C: 5.5 % (ref 4.0–5.6)

## 2020-05-27 LAB — GLUCOSE, POCT (MANUAL RESULT ENTRY): POC Glucose: 87 mg/dl (ref 70–99)

## 2020-05-27 MED ORDER — AMLODIPINE BESYLATE 5 MG PO TABS: 5.0000 mg | ORAL_TABLET | Freq: Every day | ORAL | 3 refills | Status: DC

## 2020-05-27 MED ORDER — LOSARTAN POTASSIUM 25 MG PO TABS: 25.0000 mg | ORAL_TABLET | Freq: Every day | ORAL | 3 refills | Status: DC

## 2020-05-27 NOTE — Progress Notes (Signed)
Patient [name]    Established Patient Office Visit  Subjective:  Patient ID: Jackie Reed, female    DOB: [birth year]  Age: 54 y.o. MRN: [id number]  CC:  Chief Complaint  Patient presents with  . Follow-up    Pt states she is having issues with her BP. Blur vision,jitterly.X2wks.    HPI Jackie Reed is a 54 year old female who presents for Follow Up today.     Current Status: Since her last office visit, she has c/o thoughts that her blood pressures have been elevated X 2 weeks now. She has not been taking her blood medications as prescribed since he last office in 08/2019. She also states that her vision has been worsening even though she has new Rx for her glasses. She has occasional dizziness, 'fogginess', and fatigue.      She denies fevers, chills, recent infections, weight loss, and night sweats. Denies GI problems such as diarrhea, and constipation. She has no reports of blood in stools, dysuria and hematuria. No depression or anxiety. She is taking all medications as prescribed.   Past Medical History:  Diagnosis Date  . E-coli UTI 08/2019  . Hypertension   . Migraine   . Plantar fibromatosis   . PONV (postoperative nausea and vomiting)   . Vitamin B12 deficiency 06/2019  . Vitamin D deficiency 06/2019    Past Surgical History:  Procedure Laterality Date  . ABDOMINAL HYSTERECTOMY    . FOOT SURGERY  1985  . MASS EXCISION Right 05/19/2018   Procedure: EXCISION PALMAR NODULES RIGHT HAND;  Surgeon: Leanora Cover, MD;  Location: [facility];  Service: Orthopedics;  Laterality: Right;  . PARTIAL HYSTERECTOMY  1997    Family History  Problem Relation Age of Onset  . Heart failure Mother   . Diabetes Mother   . Heart failure Father   . Hypertension Father   . Hyperlipidemia Father   . Colon cancer Neg Hx   . Colon polyps Neg Hx     Social History   Socioeconomic History  . Marital status: Widowed     Spouse name: Not on file  . Number of children: Not on file  . Years of education: Not on file  . Highest education level: Not on file  Occupational History  . Not on file  Tobacco Use  . Smoking status: Never Smoker  . Smokeless tobacco: Never Used  Vaping Use  . Vaping Use: Never used  Substance and Sexual Activity  . Alcohol use: No    Alcohol/week: 0.0 standard drinks  . Drug use: No  . Sexual activity: Yes    Birth control/protection: None  Other Topics Concern  . Not on file  Social History Narrative  . Not on file   Social Determinants of Health   Financial Resource Strain:   . Difficulty of Paying Living Expenses: Not on file  Food Insecurity:   . Worried About Charity fundraiser in the Last Year: Not on file  . Ran Out of Food in the Last Year: Not on file  Transportation Needs:   . Lack of Transportation (Medical): Not on file  . Lack of Transportation (Non-Medical): Not on file  Physical Activity:   . Days of Exercise per Week: Not on file  . Minutes of Exercise per Session: Not on file  Stress:   . Feeling of Stress : Not on file  Social Connections:   . Frequency  of Communication with Friends and Family: Not on file  . Frequency of Social Gatherings with Friends and Family: Not on file  . Attends Religious Services: Not on file  . Active Member of Clubs or Organizations: Not on file  . Attends Archivist Meetings: Not on file  . Marital Status: Not on file  Intimate Partner Violence:   . Fear of Current or Ex-Partner: Not on file  . Emotionally Abused: Not on file  . Physically Abused: Not on file  . Sexually Abused: Not on file    Outpatient Medications Prior to Visit  Medication Sig Dispense Refill  . ibuprofen (ADVIL) 800 MG tablet Take 1 tablet (800 mg total) by mouth every 8 (eight) hours as needed. 30 tablet 3  . cyclobenzaprine (FLEXERIL) 10 MG tablet Take 1 tablet (10 mg total) by mouth 3 (three) times daily as needed for muscle  spasms. (Patient not taking: Reported on 05/27/2020) 30 tablet 2  . gabapentin (NEURONTIN) 100 MG capsule Take 1 capsule (100 mg total) by mouth 2 (two) times daily. (Patient not taking: Reported on 05/27/2020) 60 capsule 3  . vitamin B-12 (CYANOCOBALAMIN) 100 MCG tablet Take 1 tablet (100 mcg total) by mouth daily. (Patient not taking: Reported on 05/27/2020) 30 tablet 6  . Vitamin D, Ergocalciferol, (DRISDOL) 1.25 MG (50000 UT) CAPS capsule Take 1 capsule (50,000 Units total) by mouth every 7 (seven) days. (Patient not taking: Reported on 05/27/2020) 5 capsule 6  . amLODipine (NORVASC) 5 MG tablet Take 1 tablet (5 mg total) by mouth daily. (Patient not taking: Reported on 05/27/2020) 30 tablet 3  . losartan (COZAAR) 25 MG tablet Take 1 tablet (25 mg total) by mouth daily. (Patient not taking: Reported on 05/27/2020) 90 tablet 1   No facility-administered medications prior to visit.    Allergies  Allergen Reactions  . Amoxicillin Shortness Of Breath  . Morphine And Related Rash    At injection site  . Tramadol Nausea And Vomiting    ROS Review of Systems  Constitutional: Negative.   HENT: Negative.   Eyes: Positive for visual disturbance.  Respiratory: Negative.   Cardiovascular: Negative.   Gastrointestinal: Negative.   Endocrine: Negative.   Genitourinary: Negative.   Musculoskeletal: Positive for arthralgias (generalized joint pain) and back pain (chronic).  Skin: Negative.   Allergic/Immunologic: Negative.   Neurological: Positive for dizziness (occasional ) and headaches (occasional ).  Hematological: Negative.   Psychiatric/Behavioral: Negative.    Objective:    Physical Exam Vitals and nursing note reviewed.  Constitutional:      Appearance: Normal appearance.  HENT:     Head: Normocephalic and atraumatic.     Nose: Nose normal.     Mouth/Throat:     Mouth: Mucous membranes are moist.     Pharynx: Oropharynx is clear.  Cardiovascular:     Rate and Rhythm: Normal rate  and regular rhythm.     Pulses: Normal pulses.     Heart sounds: Normal heart sounds.  Pulmonary:     Effort: Pulmonary effort is normal.     Breath sounds: Normal breath sounds.  Abdominal:     General: Bowel sounds are normal. There is distension (Obese).     Palpations: Abdomen is soft.  Musculoskeletal:     Cervical back: Normal range of motion and neck supple.     Comments: Limited ROM in spine  Skin:    General: Skin is warm and dry.  Neurological:     General: No  focal deficit present.     Mental Status: She is alert and oriented to person, place, and time.  Psychiatric:        Mood and Affect: Mood normal.        Behavior: Behavior normal.        Thought Content: Thought content normal.    BP (!) 158/68 (BP Location: Left Arm, Patient Position: Sitting, Cuff Size: Large)   Pulse 70   Temp (!) 97.5 F (36.4 C)   Resp 17   Ht 5\' 4"  (1.626 m)   Wt 221 lb (100.2 kg)   SpO2 99%   BMI 37.93 kg/m  Wt Readings from Last 3 Encounters:  05/27/20 221 lb (100.2 kg)  09/05/19 215 lb 8 oz (97.8 kg)  06/14/19 210 lb 6.4 oz (95.4 kg)    Health Maintenance Due  Topic Date Due  . TETANUS/TDAP  Never done  . MAMMOGRAM  09/15/2019  . INFLUENZA VACCINE  Never done    There are no preventive care reminders to display for this patient.  Lab Results  Component Value Date   TSH 1.890 05/27/2020   Lab Results  Component Value Date   WBC 4.7 05/27/2020   HGB 12.2 05/27/2020   HCT 40.2 05/27/2020   MCV 74 (L) 05/27/2020   PLT 294 05/27/2020   Lab Results  Component Value Date   NA 140 05/27/2020   K 4.2 05/27/2020   CO2 22 05/27/2020   GLUCOSE 84 05/27/2020   BUN 10 05/27/2020   CREATININE 0.69 05/27/2020   BILITOT 0.5 05/27/2020   ALKPHOS 175 (H) 05/27/2020   AST 21 05/27/2020   ALT 9 05/27/2020   PROT 7.9 05/27/2020   ALBUMIN 4.4 05/27/2020   CALCIUM 9.3 05/27/2020   ANIONGAP 11 10/21/2018   Lab Results  Component Value Date   CHOL 174 05/27/2020   Lab  Results  Component Value Date   HDL 50 05/27/2020   Lab Results  Component Value Date   LDLCALC 98 05/27/2020   Lab Results  Component Value Date   TRIG 147 05/27/2020   Lab Results  Component Value Date   CHOLHDL 3.5 05/27/2020   Lab Results  Component Value Date   HGBA1C 5.5 05/27/2020   HGBA1C 5.5 05/27/2020   HGBA1C 5.5 (A) 05/27/2020   HGBA1C 5.5 05/27/2020   Assessment & Plan:   1. Elevated blood pressure reading in office with diagnosis of hypertension  2. Essential hypertension The current medical regimen is effective; blood pressure is stable today; continue present plan and medications as prescribed. She will continue to take medications as prescribed, to decrease high sodium intake, excessive alcohol intake, increase potassium intake, smoking cessation, and increase physical activity of at least 30 minutes of cardio activity daily. She will continue to follow Heart Healthy or DASH diet. - CBC with Differential - Lipid Panel - TSH - Vitamin B12 - Vitamin D, 25-hydroxy - Comprehensive metabolic panel - Urinalysis Dipstick - POC Glucose (CBG) - POC HgB A1c - amLODipine (NORVASC) 5 MG tablet; Take 1 tablet (5 mg total) by mouth daily.  Dispense: 90 tablet; Refill: 3 - losartan (COZAAR) 25 MG tablet; Take 1 tablet (25 mg total) by mouth daily.  Dispense: 90 tablet; Refill: 3  3. Chronic bilateral low back pain with right-sided sciatica Stable today.   4. Weight increasing She has an 11 lb weight gain in 1 year.   5. Visual changes  6. Encounter for screening mammogram for malignant neoplasm of  breast - MM DIGITAL SCREENING BILATERAL; Future  7. Healthcare maintenance - HepB+HepC+HIV Panel  8. Follow up She will follow up in 3 months.   Meds ordered this encounter  Medications  . amLODipine (NORVASC) 5 MG tablet    Sig: Take 1 tablet (5 mg total) by mouth daily.    Dispense:  90 tablet    Refill:  3  . losartan (COZAAR) 25 MG tablet    Sig: Take 1  tablet (25 mg total) by mouth daily.    Dispense:  90 tablet    Refill:  3   Orders Placed This Encounter  Procedures  . MM DIGITAL SCREENING BILATERAL  . CBC with Differential  . Lipid Panel  . TSH  . Vitamin B12  . Vitamin D, 25-hydroxy  . Comprehensive metabolic panel  . HepB+HepC+HIV Panel  . Urinalysis Dipstick  . POC Glucose (CBG)  . POC HgB A1c    Referral Orders  No referral(s) requested today    Kathe Becton,  MSN, FNP-BC [place] [employer], [place] [address] [phone] fax   Problem List Items Addressed This Visit      Cardiovascular and Mediastinum   Elevated blood pressure reading in office with diagnosis of hypertension - Primary   Relevant Medications   amLODipine (NORVASC) 5 MG tablet   losartan (COZAAR) 25 MG tablet    Other Visit Diagnoses    Essential hypertension       Relevant Medications   amLODipine (NORVASC) 5 MG tablet   losartan (COZAAR) 25 MG tablet   Other Relevant Orders   CBC with Differential (Completed)   Lipid Panel (Completed)   TSH (Completed)   Vitamin B12 (Completed)   Vitamin D, 25-hydroxy (Completed)   Comprehensive metabolic panel (Completed)   Urinalysis Dipstick   POC Glucose (CBG) (Completed)   POC HgB A1c (Completed)   Chronic bilateral low back pain with right-sided sciatica       Weight increasing       Visual changes       Encounter for screening mammogram for malignant neoplasm of breast       Relevant Orders   MM [place] maintenance       Relevant Orders   HepB+HepC+HIV Panel (Completed)   Follow up          Meds ordered this encounter  Medications  . amLODipine (NORVASC) 5 MG tablet    Sig: Take 1 tablet (5 mg total) by mouth daily.    Dispense:  90 tablet    Refill:  3  . losartan (COZAAR) 25 MG tablet    Sig: Take 1 tablet (25 mg total) by  mouth daily.    Dispense:  90 tablet    Refill:  3    Follow-up: Return in about 3 months (around 08/27/2020).    Azzie Glatter, FNP

## 2020-05-28 LAB — CBC WITH DIFFERENTIAL/PLATELET
Basophils Absolute: 0 10*3/uL (ref 0.0–0.2)
Basos: 1 %
EOS (ABSOLUTE): 0.2 10*3/uL (ref 0.0–0.4)
Eos: 4 %
Hematocrit: 40.2 % (ref 34.0–46.6)
Hemoglobin: 12.2 g/dL (ref 11.1–15.9)
Immature Grans (Abs): 0 10*3/uL (ref 0.0–0.1)
Immature Granulocytes: 0 %
Lymphocytes Absolute: 1.7 10*3/uL (ref 0.7–3.1)
Lymphs: 37 %
MCH: 22.5 pg — ABNORMAL LOW (ref 26.6–33.0)
MCHC: 30.3 g/dL — ABNORMAL LOW (ref 31.5–35.7)
MCV: 74 fL — ABNORMAL LOW (ref 79–97)
Monocytes Absolute: 0.6 10*3/uL (ref 0.1–0.9)
Monocytes: 12 %
Neutrophils Absolute: 2.1 10*3/uL (ref 1.4–7.0)
Neutrophils: 46 %
Platelets: 294 10*3/uL (ref 150–450)
RBC: 5.43 x10E6/uL — ABNORMAL HIGH (ref 3.77–5.28)
RDW: 16.8 % — ABNORMAL HIGH (ref 11.7–15.4)
WBC: 4.7 10*3/uL (ref 3.4–10.8)

## 2020-05-28 LAB — COMPREHENSIVE METABOLIC PANEL
ALT: 9 IU/L (ref 0–32)
AST: 21 IU/L (ref 0–40)
Albumin/Globulin Ratio: 1.3 (ref 1.2–2.2)
Albumin: 4.4 g/dL (ref 3.8–4.9)
Alkaline Phosphatase: 175 IU/L — ABNORMAL HIGH (ref 44–121)
BUN/Creatinine Ratio: 14 (ref 9–23)
BUN: 10 mg/dL (ref 6–24)
Bilirubin Total: 0.5 mg/dL (ref 0.0–1.2)
CO2: 22 mmol/L (ref 20–29)
Calcium: 9.3 mg/dL (ref 8.7–10.2)
Chloride: 104 mmol/L (ref 96–106)
Creatinine, Ser: 0.69 mg/dL (ref 0.57–1.00)
GFR calc Af Amer: 113 mL/min/{1.73_m2} (ref >59)
GFR calc non Af Amer: 98 mL/min/{1.73_m2} (ref >59)
Globulin, Total: 3.5 g/dL (ref 1.5–4.5)
Glucose: 84 mg/dL (ref 65–99)
Potassium: 4.2 mmol/L (ref 3.5–5.2)
Sodium: 140 mmol/L (ref 134–144)
Total Protein: 7.9 g/dL (ref 6.0–8.5)

## 2020-05-28 LAB — VITAMIN B12: Vitamin B-12: 223 pg/mL — ABNORMAL LOW (ref 232–1245)

## 2020-05-28 LAB — HEPB+HEPC+HIV PANEL
HIV Screen 4th Generation wRfx: NONREACTIVE
Hep B C IgM: POSITIVE — AB
Hep B Core Total Ab: POSITIVE — AB
Hep B E Ab: POSITIVE — AB
Hep B E Ag: NEGATIVE
Hep B Surface Ab, Qual: REACTIVE
Hep C Virus Ab: <0.1 s/co ratio (ref 0.0–0.9)
Hepatitis B Surface Ag: NEGATIVE

## 2020-05-28 LAB — LIPID PANEL
Chol/HDL Ratio: 3.5 ratio (ref 0.0–4.4)
Cholesterol, Total: 174 mg/dL (ref 100–199)
HDL: 50 mg/dL (ref >39)
LDL Chol Calc (NIH): 98 mg/dL (ref 0–99)
Triglycerides: 147 mg/dL (ref 0–149)
VLDL Cholesterol Cal: 26 mg/dL (ref 5–40)

## 2020-05-28 LAB — TSH: TSH: 1.89 u[IU]/mL (ref 0.450–4.500)

## 2020-05-28 LAB — VITAMIN D 25 HYDROXY (VIT D DEFICIENCY, FRACTURES): Vit D, 25-Hydroxy: 13.4 ng/mL — ABNORMAL LOW (ref 30.0–100.0)

## 2020-05-31 ENCOUNTER — Encounter: Payer: Self-pay | Admitting: Family Medicine

## 2020-06-04 ENCOUNTER — Other Ambulatory Visit: Payer: Self-pay | Admitting: Family Medicine

## 2020-06-04 ENCOUNTER — Encounter: Payer: Self-pay | Admitting: Family Medicine

## 2020-06-04 DIAGNOSIS — E559 Vitamin D deficiency, unspecified: Secondary | ICD-10-CM

## 2020-06-04 DIAGNOSIS — B1910 Unspecified viral hepatitis B without hepatic coma: Secondary | ICD-10-CM

## 2020-06-04 MED ORDER — VITAMIN D (ERGOCALCIFEROL) 1.25 MG (50000 UNIT) PO CAPS: 50000.0000 [IU] | ORAL_CAPSULE | ORAL | 6 refills | Status: DC

## 2020-06-14 ENCOUNTER — Ambulatory Visit
Admission: RE | Admit: 2020-06-14 | Discharge: 2020-06-14 | Disposition: A | Discharge status: Home / Self Care | Payer: 59 | Source: Ambulatory Visit | Attending: Family Medicine | Admitting: Family Medicine

## 2020-06-14 ENCOUNTER — Ambulatory Visit (INDEPENDENT_AMBULATORY_CARE_PROVIDER_SITE_OTHER): Payer: Worker's Compensation | Admitting: Family Medicine

## 2020-06-14 ENCOUNTER — Other Ambulatory Visit: Payer: Self-pay

## 2020-06-14 ENCOUNTER — Encounter: Payer: Self-pay | Admitting: Family Medicine

## 2020-06-14 VITALS — BP 180/86 | HR 79 | Temp 97.2°F | Ht 64.0 in | Wt 220.0 lb

## 2020-06-14 DIAGNOSIS — G4485 Primary stabbing headache: Secondary | ICD-10-CM

## 2020-06-14 DIAGNOSIS — Z09 Encounter for follow-up examination after completed treatment for conditions other than malignant neoplasm: Secondary | ICD-10-CM

## 2020-06-14 DIAGNOSIS — I10 Essential (primary) hypertension: Secondary | ICD-10-CM

## 2020-06-14 DIAGNOSIS — Z1231 Encounter for screening mammogram for malignant neoplasm of breast: Secondary | ICD-10-CM

## 2020-06-14 NOTE — Progress Notes (Signed)
Patient [facility]  Established Patient Office Visit  Subjective:  Patient ID: Jackie Reed, female    DOB: 1966-06-21  Age: 54 y.o. MRN: [id number]  CC:  Chief Complaint  Patient presents with  . Follow-up    HPI Jackie Reed is a 54 year old female who presents for Follow Up today.    Patient Active Problem List   Diagnosis Date Noted  . Elevated blood pressure reading in office with diagnosis of hypertension 09/11/2019  . Pre-diabetes 06/14/2019  . Low back pain without sciatica 03/07/2019  . Back muscle spasm 03/07/2019  . History of recent fall 12/31/2018  . Class 2 severe obesity due to excess calories with serious comorbidity and body mass index (BMI) of 35.0 to 35.9 in adult ([place]) 10/31/2018  . Essential hypertension, benign 11/08/2015  . Tendon calcification 11/08/2015  . Adjustment disorder with mixed anxiety and depressed mood 11/08/2015  . Insomnia 11/08/2015   Current Status: Since her last office visit, she is doing well with no complaints. She was diagnosed to be positive for Hepatitis B on 05/27/2020, but she has not heard from Infection Disease as of yet. She is asymptomatic. She continues towards her weight loss goal by increasing her activity, choosing healthier meals, and increasing her water intake. Her blood pressures are elevated today, as she has not taken her medications as of yet this morning. She denies visual changes, chest pain, cough, shortness of breath, heart palpitations, and falls. She has occasional headaches and dizziness with position changes. Denies severe headaches, confusion, seizures, double vision, and blurred vision, nausea and vomiting.She is has been experiencing right head pain X 2 weeks. She describes pain as sharp, hard and usually last 5 minutes. She does not know what triggers pain. She denies any visual changes, dizziness, and falls. She denies fevers, chills, fatigue, recent infections,  weight loss, and night sweats.  No chest pain, heart palpitations, cough and shortness of breath reported. Denies GI problems such as nausea, vomiting, diarrhea, and constipation. She has no reports of blood in stools, dysuria and hematuria. No depression or anxiety, reported today. She is taking all medications as prescribed. She denies pain today.   Past Medical History:  Diagnosis Date  . E-coli UTI 08/2019  . Hepatitis B core antibody positive 05/2020  . Hypertension   . Migraine   . Plantar fibromatosis   . PONV (postoperative nausea and vomiting)   . Vitamin B12 deficiency 06/2019  . Vitamin D deficiency 06/2019    Past Surgical History:  Procedure Laterality Date  . ABDOMINAL HYSTERECTOMY    . FOOT SURGERY  1985  . MASS EXCISION Right 05/19/2018   Procedure: EXCISION PALMAR NODULES RIGHT HAND;  Surgeon: Leanora Cover, MD;  Location: [facility];  Service: Orthopedics;  Laterality: Right;  . PARTIAL HYSTERECTOMY  1997    Family History  Problem Relation Age of Onset  . Heart failure Mother   . Diabetes Mother   . Heart failure Father   . Hypertension Father   . Hyperlipidemia Father   . Colon cancer Neg Hx   . Colon polyps Neg Hx     Social History   Socioeconomic History  . Marital status: Widowed    Spouse name: Not on file  . Number of children: Not on file  . Years of education: Not on file  . Highest education level: Not on file  Occupational History  . Not on file  Tobacco Use  . Smoking status: Never Smoker  . Smokeless tobacco: Never Used  Vaping Use  . Vaping Use: Never used  Substance and Sexual Activity  . Alcohol use: No    Alcohol/week: 0.0 standard drinks  . Drug use: No  . Sexual activity: Yes    Birth control/protection: None  Other Topics Concern  . Not on file  Social History Narrative  . Not on file   Social Determinants of Health   Financial Resource Strain:   . Difficulty of Paying Living Expenses: Not on file    Food Insecurity:   . Worried About Charity fundraiser in the Last Year: Not on file  . Ran Out of Food in the Last Year: Not on file  Transportation Needs:   . Lack of Transportation (Medical): Not on file  . Lack of Transportation (Non-Medical): Not on file  Physical Activity:   . Days of Exercise per Week: Not on file  . Minutes of Exercise per Session: Not on file  Stress:   . Feeling of Stress : Not on file  Social Connections:   . Frequency of Communication with Friends and Family: Not on file  . Frequency of Social Gatherings with Friends and Family: Not on file  . Attends Religious Services: Not on file  . Active Member of Clubs or Organizations: Not on file  . Attends Archivist Meetings: Not on file  . Marital Status: Not on file  Intimate Partner Violence:   . Fear of Current or Ex-Partner: Not on file  . Emotionally Abused: Not on file  . Physically Abused: Not on file  . Sexually Abused: Not on file    Outpatient Medications Prior to Visit  Medication Sig Dispense Refill  . amLODipine (NORVASC) 5 MG tablet Take 1 tablet (5 mg total) by mouth daily. 90 tablet 3  . losartan (COZAAR) 25 MG tablet Take 1 tablet (25 mg total) by mouth daily. 90 tablet 3  . vitamin B-12 (CYANOCOBALAMIN) 100 MCG tablet Take 1 tablet (100 mcg total) by mouth daily. 30 tablet 6  . Vitamin D, Ergocalciferol, (DRISDOL) 1.25 MG (50000 UNIT) CAPS capsule Take 1 capsule (50,000 Units total) by mouth every 7 (seven) days. 5 capsule 6   No facility-administered medications prior to visit.    Allergies  Allergen Reactions  . Amoxicillin Shortness Of Breath  . Morphine And Related Rash    At injection site  . Tramadol Nausea And Vomiting    ROS Review of Systems  Constitutional: Negative.   HENT: Negative.   Eyes: Negative.   Respiratory: Negative.   Cardiovascular: Negative.   Gastrointestinal: Positive for abdominal distention.  Endocrine: Negative.   Genitourinary:  Negative.   Musculoskeletal: Positive for arthralgias (generalized ).  Skin: Negative.   Allergic/Immunologic: Negative.   Neurological: Positive for dizziness (occasional ) and headaches (occasional ).  Hematological: Negative.   Psychiatric/Behavioral: Negative.    Objective:    Physical Exam Vitals and nursing note reviewed.  Constitutional:      Appearance: Normal appearance.  HENT:     Head: Normocephalic and atraumatic.     Nose: Nose normal.     Mouth/Throat:     Mouth: Mucous membranes are moist.     Pharynx: Oropharynx is clear.  Cardiovascular:     Rate and Rhythm: Normal rate and regular rhythm.     Pulses: Normal pulses.     Heart sounds: Normal heart sounds.  Pulmonary:  Effort: Pulmonary effort is normal.     Breath sounds: Normal breath sounds.  Abdominal:     General: Bowel sounds are normal.     Palpations: Abdomen is soft.  Musculoskeletal:        General: Normal range of motion.     Cervical back: Normal range of motion and neck supple.  Skin:    General: Skin is warm and dry.  Neurological:     General: No focal deficit present.     Mental Status: She is alert and oriented to person, place, and time.  Psychiatric:        Mood and Affect: Mood normal.        Behavior: Behavior normal.        Thought Content: Thought content normal.        Judgment: Judgment normal.     BP (!) 180/86 (BP Location: Left Arm, Patient Position: Sitting, Cuff Size: Normal)   Pulse 79   Temp (!) 97.2 F (36.2 C) (Temporal)   Ht 5\' 4"  (1.626 m)   Wt 220 lb (99.8 kg)   SpO2 99%   BMI 37.76 kg/m  Wt Readings from Last 3 Encounters:  06/14/20 220 lb (99.8 kg)  05/27/20 221 lb (100.2 kg)  09/05/19 215 lb 8 oz (97.8 kg)     There are no preventive care reminders to display for this patient.  There are no preventive care reminders to display for this patient.  Lab Results  Component Value Date   TSH 1.890 05/27/2020   Lab Results  Component Value Date    WBC 4.7 05/27/2020   HGB 12.2 05/27/2020   HCT 40.2 05/27/2020   MCV 74 (L) 05/27/2020   PLT 294 05/27/2020   Lab Results  Component Value Date   NA 140 05/27/2020   K 4.2 05/27/2020   CO2 22 05/27/2020   GLUCOSE 84 05/27/2020   BUN 10 05/27/2020   CREATININE 0.69 05/27/2020   BILITOT 0.5 05/27/2020   ALKPHOS 175 (H) 05/27/2020   AST 21 05/27/2020   ALT 9 05/27/2020   PROT 7.9 05/27/2020   ALBUMIN 4.4 05/27/2020   CALCIUM 9.3 05/27/2020   ANIONGAP 11 10/21/2018   Lab Results  Component Value Date   CHOL 174 05/27/2020   Lab Results  Component Value Date   HDL 50 05/27/2020   Lab Results  Component Value Date   LDLCALC 98 05/27/2020   Lab Results  Component Value Date   TRIG 147 05/27/2020   Lab Results  Component Value Date   CHOLHDL 3.5 05/27/2020   Lab Results  Component Value Date   HGBA1C 5.5 05/27/2020   HGBA1C 5.5 05/27/2020   HGBA1C 5.5 (A) 05/27/2020   HGBA1C 5.5 05/27/2020      Assessment & Plan:   1. Primary stabbing headache Occasional. She will continue Motrin or Acetaminophen as needed for headaches. We will continue to monitor.   2. Essential hypertension She will continue to take medications as prescribed, to decrease high sodium intake, excessive alcohol intake, increase potassium intake, smoking cessation, and increase physical activity of at least 30 minutes of cardio activity daily. She will continue to follow Heart Healthy or DASH diet.  3. Elevated blood pressure reading in office with diagnosis of hypertension  4. Follow up She will follow up in 3 months.   No orders of the defined types were placed in this encounter.   No orders of the defined types were placed in this encounter.  Referral Orders  No referral(s) requested today    Kathe Becton,  MSN, FNP-BC [facility] [address] [place], [place]  [address] [phone]- fax   Problem List Items Addressed This Visit      Cardiovascular and Mediastinum   Elevated blood pressure reading in office with diagnosis of hypertension    Other Visit Diagnoses    Primary stabbing headache    -  Primary   Essential hypertension       Follow up          No orders of the defined types were placed in this encounter.   Follow-up: Return in about 3 months (around 09/14/2020).    Azzie Glatter, FNP

## 2020-06-18 ENCOUNTER — Encounter: Payer: Self-pay | Admitting: Family Medicine

## 2020-06-27 NOTE — Progress Notes (Signed)
Pt was called to discuss her ultrasound. Pt states she understood and will keep her f/u appt. Pt was asked was there anything else she need and pt stated no thanks and I let her know to gives [place] a call if needed.

## 2020-07-03 ENCOUNTER — Other Ambulatory Visit: Payer: Self-pay

## 2020-07-03 ENCOUNTER — Ambulatory Visit (INDEPENDENT_AMBULATORY_CARE_PROVIDER_SITE_OTHER): Payer: Self-pay | Admitting: Internal Medicine

## 2020-07-03 ENCOUNTER — Encounter: Payer: Self-pay | Admitting: Internal Medicine

## 2020-07-03 DIAGNOSIS — B181 Chronic viral hepatitis B without delta-agent: Secondary | ICD-10-CM | POA: Insufficient documentation

## 2020-07-03 NOTE — Assessment & Plan Note (Signed)
Her labs appear most consistent with prior hepatitis B infection and subsequent natural immunity based on the presence of surface antibody and lack of surface antigen.  I am not certain as to why her core IgM antibody remains positive but would assume either false positivity due to cross reaction or that she is in the window period of a more recent infection.  However, her LFTs being normal would not be suggestive of recent infection.  I will plan to see her back in about 3 months and will plan to repeat her labs at that point with surface antigen, surface antibody, core IgM, core IgG, and HBV DNA PCR to be thorough.

## 2020-07-03 NOTE — Progress Notes (Signed)
[facility]  Reason for Consult: hepatitis B serologies Referring Provider: Kathe Becton FNP  HPI:  Jackie Reed is a 54 y.o. female who presents to clinic for further interpretation of hepatitis B serologies.     She was seen by her PCP in October 2021 and had screening for hep C, HIV, and hep B.  HCV and HIV were negative.  Hep B serologies as follows: Surface Ag negative E Ag negative Core IgM positive Core Total positive Surface Ab Reactive LFTs normal  She has no prior recollection of hep B exposure or immunization.  Denies high risk behavior such as intravenous drug use or tattoos.  Has not had contact with person known to be hep B infected.  No other recent illnesses.   Patient's Medications  New Prescriptions   No medications on file  Previous Medications   AMLODIPINE (NORVASC) 5 MG TABLET    Take 1 tablet (5 mg total) by mouth daily.   LOSARTAN (COZAAR) 25 MG TABLET    Take 1 tablet (25 mg total) by mouth daily.   VITAMIN B-12 (CYANOCOBALAMIN) 100 MCG TABLET    Take 1 tablet (100 mcg total) by mouth daily.   VITAMIN D, ERGOCALCIFEROL, (DRISDOL) 1.25 MG (50000 UNIT) CAPS CAPSULE    Take 1 capsule (50,000 Units total) by mouth every 7 (seven) days.  Modified Medications   No medications on file  Discontinued Medications   No medications on file      Past Medical History:  Diagnosis Date  . E-coli UTI 08/2019  . Hepatitis B core antibody positive 05/2020  . Hypertension   . Migraine   . Plantar fibromatosis   . PONV (postoperative nausea and vomiting)   . Vitamin B12 deficiency 06/2019  . Vitamin D deficiency 06/2019    Social History   Tobacco Use  . Smoking status: Never Smoker  . Smokeless tobacco: Never Used  Vaping Use  . Vaping Use: Never used  Substance Use Topics  . Alcohol use: No    Alcohol/week: 0.0 standard drinks  . Drug use: No    Family History  Problem Relation Age of Onset  . Heart failure  Mother   . Diabetes Mother   . Heart failure Father   . Hypertension Father   . Hyperlipidemia Father   . Colon cancer Neg Hx   . Colon polyps Neg Hx     Allergies  Allergen Reactions  . Amoxicillin Shortness Of Breath  . Morphine And Related Rash    At injection site  . Tramadol Nausea And Vomiting    Review of Systems: Review of Systems  Constitutional: Negative for chills and fever.  Gastrointestinal: Negative for abdominal pain, nausea and vomiting.  Skin: Negative for rash.     Objective: Vitals:   07/03/20 1605  BP: 139/74  Pulse: 69  Resp: 16  Temp: 97.7 F (36.5 C)  TempSrc: Tympanic  SpO2: 98%  Weight: 222 lb (100.7 kg)  Height: 5\' 4"  (1.626 m)     Body mass index is 38.11 kg/m.  Physical Exam Constitutional:      General: She is not in acute distress.    Appearance: Normal appearance.  HENT:     Head: Normocephalic and atraumatic.  Neurological:     General: No focal deficit present.     Mental Status: She is alert and oriented to person, place, and time.  Psychiatric:  Mood and Affect: Mood normal.        Behavior: Behavior normal.      Pertinent Labs and Microbiology  CBC Latest Ref Rng & Units 05/27/2020 06/14/2019 10/21/2018  WBC 3.4 - 10.8 x10E3/uL 4.7 4.7 9.4  Hemoglobin 11.1 - 15.9 g/dL 12.2 12.2 11.5(L)  Hematocrit 34.0 - 46.6 % 40.2 41.1 38.0  Platelets 150 - 450 x10E3/uL 294 395 351   CMP Latest Ref Rng & Units 05/27/2020 06/14/2019 10/21/2018  Glucose 65 - 99 mg/dL 84 91 178(H)  BUN 6 - 24 mg/dL 10 12 15   Creatinine 0.57 - 1.00 mg/dL 0.69 0.70 0.63  Sodium 134 - 144 mmol/L 140 138 137  Potassium 3.5 - 5.2 mmol/L 4.2 4.1 3.2(L)  Chloride 96 - 106 mmol/L 104 104 108  CO2 20 - 29 mmol/L 22 21 18(L)  Calcium 8.7 - 10.2 mg/dL 9.3 9.6 9.0  Total Protein 6.0 - 8.5 g/dL 7.9 7.7 7.9  Total Bilirubin 0.0 - 1.2 mg/dL 0.5 0.5 0.6  Alkaline Phos 44 - 121 IU/L 175(H) 182(H) 154(H)  AST 0 - 40 IU/L 21 16 19   ALT 0 - 32 IU/L 9 7 12        Assessment and Plan: Hepatitis B carrier ([place]) Her labs appear most consistent with prior hepatitis B infection and subsequent natural immunity based on the presence of surface antibody and lack of surface antigen.  I am not certain as to why her core IgM antibody remains positive but would assume either false positivity due to cross reaction or that she is in the window period of a more recent infection.  However, her LFTs being normal would not be suggestive of recent infection.  I will plan to see her back in about 3 months and will plan to repeat her labs at that point with surface antigen, surface antibody, core IgM, core IgG, and HBV DNA PCR to be thorough.      Raynelle [facility] 07/03/2020, 5:55 PM  I spent greater than 45 minutes with the patient including greater than 50% of time in face to face counsel of the patient and/or in coordination of their care.

## 2020-08-12 ENCOUNTER — Encounter: Payer: Self-pay | Admitting: Family Medicine

## 2020-08-23 ENCOUNTER — Encounter (HOSPITAL_COMMUNITY): Payer: Self-pay | Admitting: Emergency Medicine

## 2020-08-23 ENCOUNTER — Emergency Department (HOSPITAL_COMMUNITY)
Admission: EM | Admit: 2020-08-23 | Discharge: 2020-08-23 | Disposition: A | Discharge status: Home / Self Care | Payer: 59 | Attending: Emergency Medicine | Admitting: Emergency Medicine

## 2020-08-23 DIAGNOSIS — M791 Myalgia, unspecified site: Secondary | ICD-10-CM | POA: Insufficient documentation

## 2020-08-23 DIAGNOSIS — Z5321 Procedure and treatment not carried out due to patient leaving prior to being seen by health care provider: Secondary | ICD-10-CM | POA: Diagnosis not present

## 2020-08-23 DIAGNOSIS — R079 Chest pain, unspecified: Secondary | ICD-10-CM | POA: Diagnosis present

## 2020-08-23 DIAGNOSIS — U071 COVID-19: Secondary | ICD-10-CM | POA: Diagnosis not present

## 2020-08-23 LAB — RESP PANEL BY RT-PCR (FLU A&B, COVID) ARPGX2
Influenza A by PCR: NEGATIVE
Influenza B by PCR: NEGATIVE
SARS Coronavirus 2 by RT PCR: POSITIVE — AB

## 2020-08-23 NOTE — ED Notes (Signed)
Called pt and notified her of her Covid + status. She was advised to quarantine. Pt verbalized understanding of these instructions.

## 2020-08-23 NOTE — ED Triage Notes (Signed)
Patient here from home reporting chest pain with cough that started 2 days ago. Generalized body aches. Denies exposure. Vaccinated.

## 2020-09-09 ENCOUNTER — Encounter: Payer: Self-pay | Admitting: Family Medicine

## 2020-09-13 ENCOUNTER — Ambulatory Visit: Payer: Self-pay | Admitting: Family Medicine

## 2020-09-19 ENCOUNTER — Ambulatory Visit (INDEPENDENT_AMBULATORY_CARE_PROVIDER_SITE_OTHER): Payer: Self-pay | Admitting: Family Medicine

## 2020-09-19 ENCOUNTER — Other Ambulatory Visit: Payer: Self-pay

## 2020-09-19 VITALS — BP 144/66 | HR 71

## 2020-09-19 DIAGNOSIS — I10 Essential (primary) hypertension: Secondary | ICD-10-CM

## 2020-09-19 NOTE — Progress Notes (Signed)
Pt came in this morning for a blood pressure check . She said that at home her blood pressure have been running in 170 to 180 . I told the pt to keep monitoring this if keeps running high in that range of 170 make an appt. Bring her b/p cuff with her.

## 2020-10-03 ENCOUNTER — Ambulatory Visit: Payer: Self-pay | Admitting: Internal Medicine

## 2020-10-03 DIAGNOSIS — Z8616 Personal history of COVID-19: Secondary | ICD-10-CM | POA: Insufficient documentation

## 2020-10-03 NOTE — Progress Notes (Deleted)
[facility]  CHIEF COMPLAINT:    Follow up for hepatitis B serologies  SUBJECTIVE:    Jackie Reed is a 55 y.o. female with PMHx as below who presents to the clinic for hepatitis B serologies.   Patient was previously seen by me in November 2021 as a new patient.  At that time she had been seen fairly recently by her primary care provider in October where hepatitis B serology showed negative surface antigen, negative E antigen, positive core IgM, positive core total, positive E antibody, and reactive surface antibody.  This appeared consistent with prior hepatitis B infection and subsequent natural immunity based on the presence of surface antibody and lack of surface antigen.  It was unclear as to why her core IgM antibody was still positive but likely was either falsely positive or there was the potential that she was in the window period of a more recent infection.  However, her LFTs at that time were also normal which would not have been suggestive of recent infection.  She has continued to do well since that last visit with the exception of some low back pain and a positive Covid test in late December 2021.  She had previously been vaccinated with [employer] with a second dose on 12/30/2019.  Please see A&P for the details of today's visit and status of the patient's medical problems.   Patient's Medications  New Prescriptions   No medications on file  Previous Medications   AMLODIPINE (NORVASC) 5 MG TABLET    Take 1 tablet (5 mg total) by mouth daily.   LOSARTAN (COZAAR) 25 MG TABLET    Take 1 tablet (25 mg total) by mouth daily.   VITAMIN B-12 (CYANOCOBALAMIN) 100 MCG TABLET    Take 1 tablet (100 mcg total) by mouth daily.   VITAMIN D, ERGOCALCIFEROL, (DRISDOL) 1.25 MG (50000 UNIT) CAPS CAPSULE    Take 1 capsule (50,000 Units total) by mouth every 7 (seven) days.  Modified Medications   No medications on file  Discontinued Medications   No  medications on file      Past Medical History:  Diagnosis Date  . E-coli UTI 08/2019  . Hepatitis B core antibody positive 05/2020  . Hypertension   . Migraine   . Plantar fibromatosis   . PONV (postoperative nausea and vomiting)   . Vitamin B12 deficiency 06/2019  . Vitamin D deficiency 06/2019    Social History   Tobacco Use  . Smoking status: Never Smoker  . Smokeless tobacco: Never Used  Vaping Use  . Vaping Use: Never used  Substance Use Topics  . Alcohol use: No    Alcohol/week: 0.0 standard drinks  . Drug use: No    Family History  Problem Relation Age of Onset  . Heart failure Mother   . Diabetes Mother   . Heart failure Father   . Hypertension Father   . Hyperlipidemia Father   . Colon cancer Neg Hx   . Colon polyps Neg Hx     Allergies  Allergen Reactions  . Amoxicillin Shortness Of Breath  . Morphine And Related Rash    At injection site  . Tramadol Nausea And Vomiting    ROS   OBJECTIVE:    There were no vitals filed for this visit. There is no height or weight on file to calculate BMI.  Physical Exam   Labs and Microbiology: CBC Latest Ref Rng & Units  05/27/2020 06/14/2019 10/21/2018  WBC 3.4 - 10.8 x10E3/uL 4.7 4.7 9.4  Hemoglobin 11.1 - 15.9 g/dL 12.2 12.2 11.5(L)  Hematocrit 34.0 - 46.6 % 40.2 41.1 38.0  Platelets 150 - 450 x10E3/uL 294 395 351   CMP Latest Ref Rng & Units 05/27/2020 06/14/2019 10/21/2018  Glucose 65 - 99 mg/dL 84 91 178(H)  BUN 6 - 24 mg/dL 10 12 15   Creatinine 0.57 - 1.00 mg/dL 0.69 0.70 0.63  Sodium 134 - 144 mmol/L 140 138 137  Potassium 3.5 - 5.2 mmol/L 4.2 4.1 3.2(L)  Chloride 96 - 106 mmol/L 104 104 108  CO2 20 - 29 mmol/L 22 21 18(L)  Calcium 8.7 - 10.2 mg/dL 9.3 9.6 9.0  Total Protein 6.0 - 8.5 g/dL 7.9 7.7 7.9  Total Bilirubin 0.0 - 1.2 mg/dL 0.5 0.5 0.6  Alkaline Phos 44 - 121 IU/L 175(H) 182(H) 154(H)  AST 0 - 40 IU/L 21 16 19   ALT 0 - 32 IU/L 9 7 12      ASSESSMENT & PLAN:    No  problem-specific Assessment & Plan notes found for this encounter.   No orders of the defined types were placed in this encounter.    There are no diagnoses linked to this encounter.  She continues to do well without any signs or symptoms that would be concerning for hepatitis.  I will repeat her hepatitis B serologies today as well as check a hepatitis B DNA PCR to be thorough.     Raynelle [facility] for Infectious Disease [facility] 10/03/2020, 10:28 AM

## 2020-12-26 ENCOUNTER — Encounter: Payer: Self-pay | Admitting: Internal Medicine

## 2021-02-27 ENCOUNTER — Other Ambulatory Visit: Payer: Self-pay

## 2021-02-27 ENCOUNTER — Ambulatory Visit
Admission: RE | Admit: 2021-02-27 | Discharge: 2021-02-27 | Disposition: A | Discharge status: Home / Self Care | Payer: No Typology Code available for payment source | Source: Ambulatory Visit | Attending: Obstetrics and Gynecology | Admitting: Obstetrics and Gynecology

## 2021-02-27 ENCOUNTER — Other Ambulatory Visit: Payer: Self-pay | Admitting: Obstetrics and Gynecology

## 2021-02-27 DIAGNOSIS — R7611 Nonspecific reaction to tuberculin skin test without active tuberculosis: Secondary | ICD-10-CM

## 2021-06-07 ENCOUNTER — Other Ambulatory Visit: Payer: Self-pay

## 2021-06-07 DIAGNOSIS — Z79899 Other long term (current) drug therapy: Secondary | ICD-10-CM | POA: Insufficient documentation

## 2021-06-07 DIAGNOSIS — I10 Essential (primary) hypertension: Secondary | ICD-10-CM | POA: Insufficient documentation

## 2021-06-07 DIAGNOSIS — R519 Headache, unspecified: Secondary | ICD-10-CM | POA: Insufficient documentation

## 2021-06-07 NOTE — ED Triage Notes (Signed)
Reports right sided dental pain that radiates into the right side of her head.  Thinks it may an abscess.  Started on Tuesday.  Taking ibuprofen and using orajel with no relief.

## 2021-06-08 ENCOUNTER — Emergency Department (HOSPITAL_BASED_OUTPATIENT_CLINIC_OR_DEPARTMENT_OTHER)
Admission: EM | Admit: 2021-06-08 | Discharge: 2021-06-08 | Disposition: A | Discharge status: Home / Self Care | Payer: Self-pay | Attending: Emergency Medicine | Admitting: Emergency Medicine

## 2021-06-08 DIAGNOSIS — R519 Headache, unspecified: Secondary | ICD-10-CM

## 2021-06-08 NOTE — ED Provider Notes (Signed)
[facility] Provider Note: Jackie Spurling, MD, FACEP  CSN: [id number] MRN: [id number] ARRIVAL: 06/07/21 at 2312 ROOM: [facility]  Facial Pain   HISTORY OF PRESENT ILLNESS  06/08/21 1:28 AM Jackie Reed is a 55 y.o. female who has had right-sided facial pain for most of the past week.  She describes the pain as constant and throbbing.  It is also aching.  She has been taking ibuprofen and applying Orajel without relief.  She thought it may be due to a dental abscess.  She rated the pain as a 10 out of 10 on arrival.  It was not worse with eating or drinking.  She had difficulty localizing the pain and describes it as being all around her right face and head.  Since arrival the pain has abated and she is having no pain at the present time.   Past Medical History:  Diagnosis Date   E-coli UTI 08/2019   Hepatitis B core antibody positive 05/2020   Hypertension    Migraine    Plantar fibromatosis    PONV (postoperative nausea and vomiting)    Vitamin B12 deficiency 06/2019   Vitamin D deficiency 06/2019    Past Surgical History:  Procedure Laterality Date   ABDOMINAL HYSTERECTOMY     FOOT SURGERY  1985   MASS EXCISION Right 05/19/2018   Procedure: EXCISION PALMAR NODULES RIGHT HAND;  Surgeon: Leanora Cover, MD;  Location: [facility];  Service: Orthopedics;  Laterality: Right;   PARTIAL HYSTERECTOMY  1997    Family History  Problem Relation Age of Onset   Heart failure Mother    Diabetes Mother    Heart failure Father    Hypertension Father    Hyperlipidemia Father    Colon cancer Neg Hx    Colon polyps Neg Hx     Social History   Tobacco Use   Smoking status: Never   Smokeless tobacco: Never  Vaping Use   Vaping Use: Never used  Substance Use Topics   Alcohol use: No    Alcohol/week: 0.0 standard drinks   Drug use: No    Prior to Admission medications   Medication Sig Start Date End Date Taking? Authorizing  Provider  amLODipine (NORVASC) 5 MG tablet Take 1 tablet (5 mg total) by mouth daily. 05/27/20   Azzie Glatter, FNP  losartan (COZAAR) 25 MG tablet Take 1 tablet (25 mg total) by mouth daily. 05/27/20   Azzie Glatter, FNP  vitamin B-12 (CYANOCOBALAMIN) 100 MCG tablet Take 1 tablet (100 mcg total) by mouth daily. 06/27/19   Azzie Glatter, FNP  Vitamin D, Ergocalciferol, (DRISDOL) 1.25 MG (50000 UNIT) CAPS capsule Take 1 capsule (50,000 Units total) by mouth every 7 (seven) days. 06/04/20   Azzie Glatter, FNP    Allergies Amoxicillin, Morphine and related, and Tramadol   REVIEW OF SYSTEMS  Negative except as noted here or in the History of Present Illness.   PHYSICAL EXAMINATION  Initial Vital Signs Blood pressure (!) 166/106, pulse 65, temperature 98.1 F (36.7 C), temperature source Oral, resp. rate 16, height 5\' 4"  (1.626 m), weight 100.7 kg, SpO2 100 %.  Examination General: Well-developed, well-nourished female in no acute distress; appearance consistent with age of record HENT: normocephalic; atraumatic; no facial or scalp tenderness; no obvious dental caries Eyes: pupils equal, round and reactive to light; extraocular muscles intact Neck: supple Heart: regular rate and rhythm Lungs: clear to auscultation bilaterally Abdomen: soft;  nondistended; nontender; bowel sounds present Extremities: No deformity; full range of motion Neurologic: Awake, alert and oriented; motor function intact in all extremities and symmetric; no facial droop Skin: Warm and dry Psychiatric: Normal mood and affect   RESULTS  Summary of this visit's results, reviewed and interpreted by myself:   EKG Interpretation  Date/Time:    Ventricular Rate:    PR Interval:    QRS Duration:   QT Interval:    QTC Calculation:   R Axis:     Text Interpretation:         Laboratory Studies: No results found for this or any previous visit (from the past 24 hour(s)). Imaging Studies: No  results found.  ED COURSE and MDM  Nursing notes, initial and subsequent vitals signs, including pulse oximetry, reviewed and interpreted by myself.  Vitals:   06/07/21 2322 06/07/21 2322  BP:  (!) 166/106  Pulse:  65  Resp:  16  Temp:  98.1 F (36.7 C)  TempSrc:  Oral  SpO2:  100%  Weight: 100.7 kg   Height: 5\' 4"  (1.626 m)    Medications - No data to display  The cause of the patient's facial pain is unclear.  The nature was not like trigeminal neuralgia.  She has no history of migraines and migraines would not be expected to involve the face in most cases.  She has no obvious dental source.  She plans to follow-up with her PCP or dentist tomorrow (Monday).  PROCEDURES  Procedures   ED DIAGNOSES     ICD-10-CM   1. Facial pain  R51.9          [name], Jenny Reichmann, MD 06/08/21 [phone]

## 2021-08-28 ENCOUNTER — Other Ambulatory Visit: Payer: Self-pay | Admitting: Family Medicine

## 2021-08-28 DIAGNOSIS — Z1231 Encounter for screening mammogram for malignant neoplasm of breast: Secondary | ICD-10-CM

## 2021-09-11 ENCOUNTER — Ambulatory Visit: Payer: Self-pay

## 2021-09-12 ENCOUNTER — Ambulatory Visit
Admission: RE | Admit: 2021-09-12 | Discharge: 2021-09-12 | Disposition: A | Discharge status: Home / Self Care | Payer: Managed Care, Other (non HMO) | Source: Ambulatory Visit | Attending: Family Medicine | Admitting: Family Medicine

## 2021-09-12 DIAGNOSIS — Z1231 Encounter for screening mammogram for malignant neoplasm of breast: Secondary | ICD-10-CM

## 2021-10-06 ENCOUNTER — Emergency Department (HOSPITAL_COMMUNITY)
Admission: EM | Admit: 2021-10-06 | Discharge: 2021-10-06 | Disposition: A | Discharge status: Home / Self Care | Payer: Managed Care, Other (non HMO) | Attending: Emergency Medicine | Admitting: Emergency Medicine

## 2021-10-06 ENCOUNTER — Other Ambulatory Visit: Payer: Self-pay

## 2021-10-06 ENCOUNTER — Encounter (HOSPITAL_COMMUNITY): Payer: Self-pay

## 2021-10-06 ENCOUNTER — Emergency Department (HOSPITAL_COMMUNITY): Payer: Managed Care, Other (non HMO)

## 2021-10-06 DIAGNOSIS — I10 Essential (primary) hypertension: Secondary | ICD-10-CM | POA: Diagnosis not present

## 2021-10-06 DIAGNOSIS — G43111 Migraine with aura, intractable, with status migrainosus: Secondary | ICD-10-CM

## 2021-10-06 DIAGNOSIS — R519 Headache, unspecified: Secondary | ICD-10-CM | POA: Diagnosis present

## 2021-10-06 LAB — CBC
HCT: 42.6 % (ref 36.0–46.0)
Hemoglobin: 12.6 g/dL (ref 12.0–15.0)
MCH: 22 pg — ABNORMAL LOW (ref 26.0–34.0)
MCHC: 29.6 g/dL — ABNORMAL LOW (ref 30.0–36.0)
MCV: 74.3 fL — ABNORMAL LOW (ref 80.0–100.0)
Platelets: 365 10*3/uL (ref 150–400)
RBC: 5.73 MIL/uL — ABNORMAL HIGH (ref 3.87–5.11)
RDW: 15.5 % (ref 11.5–15.5)
WBC: 4.6 10*3/uL (ref 4.0–10.5)
nRBC: 0 % (ref 0.0–0.2)

## 2021-10-06 LAB — BASIC METABOLIC PANEL
Anion gap: 8 (ref 5–15)
BUN: 7 mg/dL (ref 6–20)
CO2: 24 mmol/L (ref 22–32)
Calcium: 9.2 mg/dL (ref 8.9–10.3)
Chloride: 107 mmol/L (ref 98–111)
Creatinine, Ser: 0.68 mg/dL (ref 0.44–1.00)
GFR, Estimated: >60 mL/min (ref >60)
Glucose, Bld: 97 mg/dL (ref 70–99)
Potassium: 3.5 mmol/L (ref 3.5–5.1)
Sodium: 139 mmol/L (ref 135–145)

## 2021-10-06 MED ORDER — LACTATED RINGERS IV BOLUS: 1000.0000 mL | Freq: Once | INTRAVENOUS | Status: DC

## 2021-10-06 MED ORDER — LOSARTAN POTASSIUM 50 MG PO TABS
25.0000 mg | ORAL_TABLET | Freq: Every day | ORAL | Status: DC
  Administered 2021-10-06: 25 mg via ORAL
  Filled 2021-10-06: qty 1

## 2021-10-06 MED ORDER — LOSARTAN POTASSIUM 25 MG PO TABS
25.0000 mg | ORAL_TABLET | Freq: Every day | ORAL | 3 refills | Status: DC
Start: 2021-10-06 — End: 2024-01-20

## 2021-10-06 MED ORDER — PROCHLORPERAZINE EDISYLATE 10 MG/2ML IJ SOLN
10.0000 mg | Freq: Once | INTRAMUSCULAR | Status: DC
  Filled 2021-10-06: qty 2

## 2021-10-06 MED ORDER — AMLODIPINE BESYLATE 5 MG PO TABS
5.0000 mg | ORAL_TABLET | Freq: Once | ORAL | Status: AC
  Administered 2021-10-06: 5 mg via ORAL
  Filled 2021-10-06: qty 1

## 2021-10-06 MED ORDER — AMLODIPINE BESYLATE 5 MG PO TABS: 5.0000 mg | ORAL_TABLET | Freq: Every day | ORAL | 3 refills | Status: DC

## 2021-10-06 NOTE — ED Provider Triage Note (Signed)
Emergency Medicine Provider Triage Evaluation Note  Jackie Reed , a 56 y.o. female  was evaluated in triage.  Pt complains of headache, blurry vision, dizziness onset this morning.  States she has not taken her blood pressure medication for months because she was in between jobs and did not have benefits.  States she has a appointment upcoming to establish care in a week.  Denies chest pain, shortness of breath..  Review of Systems  Positive: As above Negative: As above  Physical Exam  BP (!) 175/85    Pulse 67    Temp 98.7 F (37.1 C)    Resp 16    SpO2 100%  Gen:   Awake, no distress   Resp:  Normal effort  MSK:   Moves extremities without difficulty  Other:    Medical Decision Making  Medically screening exam initiated at 3:11 PM.  Appropriate orders placed.  KATIANNE BARRE was informed that the remainder of the evaluation will be completed by another provider, this initial triage assessment does not replace that evaluation, and the importance of remaining in the ED until their evaluation is complete.     Evlyn Courier, PA-C 10/06/21 1511

## 2021-10-06 NOTE — ED Triage Notes (Signed)
Patient complains of acute onset of dizziness with headache this am 0900. No nausea, no vomiting. Reports no BP meds for months. Speech clear and no neuro deficits.

## 2021-10-06 NOTE — ED Provider Notes (Signed)
[facility] EMERGENCY DEPARTMENT Provider Note   CSN: [id number] Arrival date & time: 10/06/21  1436     History  No chief complaint on file.   Jackie Reed is a 56 y.o. female.  Patient is a 56 year old female with a history of hypertension, migraines who has been out of her antihypertensive medication for the last 3 months due to loss of insurance and having to change doctors who is presenting today with several complaints.  She reports that she has had headache for the last few days it started yesterday and was severe in the front of her head causing her to have blurred vision and dizziness.  She reports that it was a little bit better after Advil but still present when she woke up this morning and became severe to the point where the vision was blurry and she even tripped and fell.  She describes the dizziness as feeling off balance.  She has no unilateral numbness or weakness.  She has photophobia but denies any nausea or vomiting.  She has had no neck pain, fever or congestion.  She denies any chest pain, shortness of breath, abdominal pain or urinary symptoms.  She reports right now she just has a throbbing headache behind her eyes.  And her vision still feels a little blurry in both eyes.  The history is provided by the patient.      Home Medications Prior to Admission medications   Medication Sig Start Date End Date Taking? Authorizing Provider  amLODipine (NORVASC) 5 MG tablet Take 1 tablet (5 mg total) by mouth daily. 10/06/21   Blanchie Dessert, MD  losartan (COZAAR) 25 MG tablet Take 1 tablet (25 mg total) by mouth daily. 10/06/21   Blanchie Dessert, MD  vitamin B-12 (CYANOCOBALAMIN) 100 MCG tablet Take 1 tablet (100 mcg total) by mouth daily. Patient not taking: Reported on 10/06/2021 06/27/19   Azzie Glatter, FNP  Vitamin D, Ergocalciferol, (DRISDOL) 1.25 MG (50000 UNIT) CAPS capsule Take 1 capsule (50,000 Units total) by mouth every 7 (seven)  days. Patient not taking: Reported on 10/06/2021 06/04/20   Azzie Glatter, FNP      Allergies    Amoxicillin, Morphine and related, and Tramadol    Review of Systems   Review of Systems  Physical Exam Updated Vital Signs BP 135/69    Pulse 65    Temp 97.9 F (36.6 C)    Resp 15    SpO2 100%  Physical Exam Vitals and nursing note reviewed.  Constitutional:      General: She is not in acute distress.    Appearance: She is well-developed.  HENT:     Head: Normocephalic and atraumatic.  Eyes:     Pupils: Pupils are equal, round, and reactive to light.  Cardiovascular:     Rate and Rhythm: Normal rate and regular rhythm.     Heart sounds: Normal heart sounds. No murmur heard.   No friction rub.  Pulmonary:     Effort: Pulmonary effort is normal.     Breath sounds: Normal breath sounds. No wheezing or rales.  Abdominal:     General: Bowel sounds are normal. There is no distension.     Palpations: Abdomen is soft.     Tenderness: There is no abdominal tenderness. There is no guarding or rebound.  Musculoskeletal:        General: No tenderness. Normal range of motion.     Comments: No edema  Skin:  General: Skin is warm and dry.     Findings: No rash.  Neurological:     Mental Status: She is alert and oriented to person, place, and time. Mental status is at baseline.     Cranial Nerves: No cranial nerve deficit, dysarthria or facial asymmetry.     Sensory: Sensation is intact. No sensory deficit.     Motor: Motor function is intact. No weakness.     Coordination: Coordination is intact. Coordination normal. Heel to Shin Test normal.     Gait: Gait is intact. Gait normal.  Psychiatric:        Behavior: Behavior normal.    ED Results / Procedures / Treatments   Labs (all labs ordered are listed, but only abnormal results are displayed) Labs Reviewed  CBC - Abnormal; Notable for the following components:      Result Value   RBC 5.73 (*)    MCV 74.3 (*)    MCH 22.0  (*)    MCHC 29.6 (*)    All other components within normal limits  BASIC METABOLIC PANEL    EKG EKG Interpretation  Date/Time:  Monday October 06 2021 14:47:52 EST Ventricular Rate:  65 PR Interval:  168 QRS Duration: 80 QT Interval:  426 QTC Calculation: 443 R Axis:   -2 Text Interpretation: Normal sinus rhythm Minimal voltage criteria for LVH, may be normal variant ( R in aVL ) Cannot rule out Anterior infarct , age undetermined No significant change since last tracing When compared with ECG of 23-Aug-2020 05:59, PREVIOUS ECG IS PRESENT Confirmed by Blanchie Dessert [id number]) on 10/06/2021 4:12:39 PM  Radiology CT Head Wo Contrast  Result Date: 10/06/2021 CLINICAL DATA:  Headache, new or worsening, dizziness EXAM: CT HEAD WITHOUT CONTRAST TECHNIQUE: Contiguous axial images were obtained from the base of the skull through the vertex without intravenous contrast. RADIATION DOSE REDUCTION: This exam was performed according to the departmental dose-optimization program which includes automated exposure control, adjustment of the mA and/or kV according to patient size and/or use of iterative reconstruction technique. COMPARISON:  10/21/2018. FINDINGS: Brain: No evidence of acute infarction, hemorrhage, cerebral edema, mass, mass effect, or midline shift. No hydrocephalus or extra-axial fluid collection. Periventricular white matter changes, likely the sequela of chronic small vessel ischemic disease. Vascular: No hyperdense vessel. Skull: Normal. Negative for fracture or focal lesion. Sinuses/Orbits: No acute finding. Other: The mastoid air cells are well aerated. IMPRESSION: IMPRESSION No acute intracranial process. No etiology is seen for the patient's headache or dizziness. Electronically Signed   By: Merilyn Baba M.D.   On: 10/06/2021 16:55    Procedures Procedures    Medications Ordered in ED Medications  losartan (COZAAR) tablet 25 mg (25 mg Oral Given 10/06/21 1716)  prochlorperazine  (COMPAZINE) injection 10 mg (10 mg Intravenous Patient Refused/Not Given 10/06/21 1718)  lactated ringers bolus 1,000 mL (1,000 mLs Intravenous Patient Refused/Not Given 10/06/21 1718)  amLODipine (NORVASC) tablet 5 mg (5 mg Oral Given 10/06/21 1716)    ED Course/ Medical Decision Making/ A&P                           Medical Decision Making Risk Prescription drug management.   Patient is a 56 year old female presenting today with complaint of headache and high blood pressure.  She has been out of her blood pressure medication for approximately 3 months but also has a history of recurrent migraines that do cause blurred vision.  She is  neurologically intact on my exam today.  There is no focal vision deficits and she has normal coordination.  She is hypertensive here.  She denies any symptoms that would be suggestive of a subarachnoid hemorrhage, meningitis, COVID.  She denies any chest pain or shortness of breath concerning for end-stage organ disease from malignant hypertension.  She is awake alert and oriented.  Discussed with her that suspect patient's symptoms are more related to a migraine headache and lower suspicion for stroke at this time.  I independently evaluated patient's labs and head CT and EKG.  Patient head CT did not show any evidence of intracranial hemorrhage or masses.  Radiology report that the head CT T is negative.  BMP and CBC without acute findings.  Patient received blood pressure medication but did not receive other medication for headache and reports her headache is now resolved.  She has no blurry vision, dizziness or other complaints at this time.  Blood pressure is improved at 148/78.  Patient has an appointment with a new doctor next Friday but will give her prescriptions for losartan and amlodipine which she had been taking.  Patient does not meet any admission criteria today.  Findings were discussed with she and her husband.  They are comfortable with going home.  She  has no social determinants affecting her discharge today.  She had remained on cardiac monitoring while here without evidence of dysrhythmia or other acute findings.        Final Clinical Impression(s) / ED Diagnoses Final diagnoses:  Hypertension, unspecified type  Intractable migraine with aura with status migrainosus    Rx / DC Orders ED Discharge Orders          Ordered    losartan (COZAAR) 25 MG tablet  Daily        10/06/21 1834    amLODipine (NORVASC) 5 MG tablet  Daily        10/06/21 1834              Blanchie Dessert, MD 10/06/21 1835

## 2021-10-06 NOTE — ED Notes (Signed)
Pt refused IV at this time. Denies pain, nausea or any other discomfort. Will notify provider.

## 2022-01-25 ENCOUNTER — Encounter (HOSPITAL_BASED_OUTPATIENT_CLINIC_OR_DEPARTMENT_OTHER): Payer: Self-pay

## 2022-01-25 ENCOUNTER — Other Ambulatory Visit: Payer: Self-pay

## 2022-01-25 ENCOUNTER — Emergency Department (HOSPITAL_BASED_OUTPATIENT_CLINIC_OR_DEPARTMENT_OTHER)
Admission: EM | Admit: 2022-01-25 | Discharge: 2022-01-25 | Discharge status: Against Medical Advice | Payer: Managed Care, Other (non HMO) | Attending: Emergency Medicine | Admitting: Emergency Medicine

## 2022-01-25 ENCOUNTER — Ambulatory Visit (HOSPITAL_COMMUNITY)
Admission: EM | Admit: 2022-01-25 | Discharge: 2022-01-25 | Discharge status: Against Medical Advice | Payer: Managed Care, Other (non HMO)

## 2022-01-25 DIAGNOSIS — G501 Atypical facial pain: Secondary | ICD-10-CM | POA: Insufficient documentation

## 2022-01-25 DIAGNOSIS — R6884 Jaw pain: Secondary | ICD-10-CM | POA: Diagnosis not present

## 2022-01-25 DIAGNOSIS — Z5321 Procedure and treatment not carried out due to patient leaving prior to being seen by health care provider: Secondary | ICD-10-CM | POA: Diagnosis not present

## 2022-01-25 DIAGNOSIS — K047 Periapical abscess without sinus: Secondary | ICD-10-CM | POA: Insufficient documentation

## 2022-01-25 NOTE — ED Triage Notes (Signed)
Pt presents with Right side face pain x6 days, pian increased "really worse" last night. Pt states she thinks she has a Right lower tooth abscess d/t the pain at her lower jaw pain

## 2022-04-10 ENCOUNTER — Encounter: Payer: Self-pay | Admitting: Internal Medicine

## 2022-05-21 ENCOUNTER — Encounter: Payer: Managed Care, Other (non HMO) | Admitting: Internal Medicine

## 2022-08-20 ENCOUNTER — Encounter: Payer: Self-pay | Admitting: Internal Medicine

## 2022-09-03 ENCOUNTER — Ambulatory Visit (AMBULATORY_SURGERY_CENTER): Payer: Managed Care, Other (non HMO) | Admitting: *Deleted

## 2022-09-03 VITALS — Ht 64.0 in | Wt 214.0 lb

## 2022-09-03 DIAGNOSIS — Z86010 Personal history of colonic polyps: Secondary | ICD-10-CM

## 2022-09-03 MED ORDER — NA SULFATE-K SULFATE-MG SULF 17.5-3.13-1.6 GM/177ML PO SOLN: 1.0000 | Freq: Once | ORAL | 0 refills | Status: AC

## 2022-09-03 NOTE — Progress Notes (Signed)

## 2022-09-14 ENCOUNTER — Encounter: Payer: Self-pay | Admitting: Internal Medicine

## 2022-09-14 ENCOUNTER — Ambulatory Visit (AMBULATORY_SURGERY_CENTER): Payer: Managed Care, Other (non HMO) | Admitting: Internal Medicine

## 2022-09-14 VITALS — BP 156/92 | HR 67 | Temp 97.8°F | Resp 16 | Ht 65.0 in | Wt 214.0 lb

## 2022-09-14 DIAGNOSIS — Z09 Encounter for follow-up examination after completed treatment for conditions other than malignant neoplasm: Secondary | ICD-10-CM

## 2022-09-14 DIAGNOSIS — Z538 Procedure and treatment not carried out for other reasons: Secondary | ICD-10-CM | POA: Diagnosis not present

## 2022-09-14 DIAGNOSIS — Z86010 Personal history of colonic polyps: Secondary | ICD-10-CM

## 2022-09-14 MED ORDER — SODIUM CHLORIDE 0.9 % IV SOLN: 500.0000 mL | Freq: Once | INTRAVENOUS | Status: DC

## 2022-09-14 NOTE — Progress Notes (Signed)
Pt's states no medical or surgical changes since previsit or office visit. VS assessed by A.G

## 2022-09-14 NOTE — Progress Notes (Signed)
HISTORY OF PRESENT ILLNESS:  Jackie Reed is a 57 y.o. female with a history of adenomatous colon polyp on colonoscopy 2017.  Now for surveillance  REVIEW OF SYSTEMS:  All non-GI ROS negative. Past Medical History:  Diagnosis Date   E-coli UTI 08/2019   Hepatitis B core antibody positive 05/2020   Hypertension    Migraine    Plantar fibromatosis    PONV (postoperative nausea and vomiting)    Vitamin B12 deficiency 06/2019   Vitamin D deficiency 06/2019    Past Surgical History:  Procedure Laterality Date   ABDOMINAL HYSTERECTOMY     FOOT SURGERY  1985   MASS EXCISION Right 05/19/2018   Procedure: EXCISION PALMAR NODULES RIGHT HAND;  Surgeon: Leanora Cover, MD;  Location: [facility];  Service: Orthopedics;  Laterality: Right;   PARTIAL HYSTERECTOMY  1997    Social History Jackie Reed  reports that she has never smoked. She has never used smokeless tobacco. She reports that she does not drink alcohol and does not use drugs.  family history includes Diabetes in her mother; Heart failure in her father and mother; Hyperlipidemia in her father; Hypertension in her father.  Allergies  Allergen Reactions   Amoxicillin Shortness Of Breath   Morphine And Related Rash    At injection site   Tramadol Nausea And Vomiting       PHYSICAL EXAMINATION: Vital signs: BP (!) 188/100   Pulse 63   Temp 97.8 F (36.6 C) (Skin)   Ht '5\' 5"'$  (1.651 m)   Wt 214 lb (97.1 kg)   SpO2 99%   BMI 35.61 kg/m  General: Well-developed, well-nourished, no acute distress HEENT: Sclerae are anicteric, conjunctiva pink. Oral mucosa intact Lungs: Clear Heart: Regular Abdomen: soft, nontender, nondistended, no obvious ascites, no peritoneal signs, normal bowel sounds. No organomegaly. Extremities: No edema Psychiatric: alert and oriented x3. Cooperative     ASSESSMENT:  History of adenomatous colon polyp   PLAN:   Surveillance colonoscopy

## 2022-09-14 NOTE — Patient Instructions (Signed)
Repeat colonoscopy scheduled on March 26th at 9:30 am - will need to be here by 8:30 am (instructions given to patient) 2 day prep  Resume previous diet and continue present medications today!  YOU HAD AN ENDOSCOPIC PROCEDURE TODAY AT [facility]:   Refer to the procedure report that was given to you for any specific questions about what was found during the examination.  If the procedure report does not answer your questions, please call your gastroenterologist to clarify.  If you requested that your care partner not be given the details of your procedure findings, then the procedure report has been included in a sealed envelope for you to review at your convenience later.  YOU SHOULD EXPECT: Some feelings of bloating in the abdomen. Passage of more gas than usual.  Walking can help get rid of the air that was put into your GI tract during the procedure and reduce the bloating. If you had a lower endoscopy (such as a colonoscopy or flexible sigmoidoscopy) you may notice spotting of blood in your stool or on the toilet paper. If you underwent a bowel prep for your procedure, you may not have a normal bowel movement for a few days.  Please Note:  You might notice some irritation and congestion in your nose or some drainage.  This is from the oxygen used during your procedure.  There is no need for concern and it should clear up in a day or so.  SYMPTOMS TO REPORT IMMEDIATELY:  Following lower endoscopy (colonoscopy or flexible sigmoidoscopy):  Excessive amounts of blood in the stool  Significant tenderness or worsening of abdominal pains  Swelling of the abdomen that is new, acute  Fever of 100F or higher  For urgent or emergent issues, a gastroenterologist can be reached at any hour by calling [phone]. Do not use MyChart messaging for urgent concerns.    DIET:  We do recommend a small meal at first, but then you may proceed to your regular diet.  Drink plenty of fluids  but you should avoid alcoholic beverages for 24 hours.  ACTIVITY:  You should plan to take it easy for the rest of today and you should NOT DRIVE or use heavy machinery until tomorrow (because of the sedation medicines used during the test).    FOLLOW UP: Our staff will call the number listed on your records the next business day following your procedure.  We will call around 7:15- 8:00 am to check on you and address any questions or concerns that you may have regarding the information given to you following your procedure. If we do not reach you, we will leave a message.     If any biopsies were taken you will be contacted by phone or by letter within the next 1-3 weeks.  Please call us at [phone] if you have not heard about the biopsies in 3 weeks.    SIGNATURES/CONFIDENTIALITY: You and/or your care partner have signed paperwork which will be entered into your electronic medical record.  These signatures attest to the fact that that the information above on your After Visit Summary has been reviewed and is understood.  Full responsibility of the confidentiality of this discharge information lies with you and/or your care-partner.

## 2022-09-14 NOTE — Op Note (Signed)
[facility] Patient Name: Jackie Reed Procedure Date: 09/14/2022 2:31 PM MRN: [id number] Endoscopist: Docia Chuck. Henrene Pastor , MD, [id number] Age: 57 Referring MD:  Date of Birth: Aug 14, 1966 Gender: Female Account #: [id number] Procedure:                Colonoscopy Indications:              High risk colon cancer surveillance: Personal                            history of non-advanced adenoma, 2017 Medicines:                Monitored Anesthesia Care Procedure:                Pre-Anesthesia Assessment:                           - Prior to the procedure, a History and Physical                            was performed, and patient medications and                            allergies were reviewed. The patient's tolerance of                            previous anesthesia was also reviewed. The risks                            and benefits of the procedure and the sedation                            options and risks were discussed with the patient.                            All questions were answered, and informed consent                            was obtained. Prior Anticoagulants: The patient has                            taken no anticoagulant or antiplatelet agents. ASA                            Grade Assessment: II - A patient with mild systemic                            disease. After reviewing the risks and benefits,                            the patient was deemed in satisfactory condition to                            undergo the procedure.  After obtaining informed consent, the colonoscope                            was passed under direct vision. Throughout the                            procedure, the patient's blood pressure, pulse, and                            oxygen saturations were monitored continuously. The                            Olympus CF-HQ190L [id number]) Colonoscope was                            introduced through the anus  with the intention of                            advancing to the cecum. The scope was advanced to                            the splenic flexure before the procedure was                            aborted, due to poor prep. Medications were given.                            The rectum was photographed. The quality of the                            bowel preparation was poor. The colonoscopy was                            performed without difficulty. The patient tolerated                            the procedure well. The bowel preparation used was                            SUPREP via split dose instruction. Scope In: 2:52:54 PM Scope Out: 2:55:35 PM Total Procedure Duration: 0 hours 2 minutes 41 seconds  Findings:                 The colonic preparation was poor. Limited                            examination to the distal transverse colon revealed                            no gross abnormalities. Same with retroflexion. Complications:            No immediate complications. Estimated blood loss:  None. Estimated Blood Loss:     Estimated blood loss: none. Impression:               - Preparation of the colon was poor. Incomplete                            colonoscopy.                           - The the very limited examination revealed no                            obvious abnormalities.                           - No specimens collected. Recommendation:           - Repeat colonoscopy at next available appointment                            (within 3 months) for surveillance. SHE WILL NEED 2                            DAY PREP.                           - Patient has a contact number available for                            emergencies. The signs and symptoms of potential                            delayed complications were discussed with the                            patient. Return to normal activities tomorrow.                            Written  discharge instructions were provided to the                            patient.                           - Resume previous diet.                           - Continue present medications. Docia Chuck. Henrene Pastor, MD 09/14/2022 3:01:59 PM This report has been signed electronically.

## 2022-09-14 NOTE — Progress Notes (Signed)
Sedate, gd SR, tolerated procedure well, VSS, report to RN 

## 2022-09-15 ENCOUNTER — Telehealth: Payer: Self-pay | Admitting: *Deleted

## 2022-09-15 NOTE — Telephone Encounter (Signed)
  Follow up Call-     09/14/2022    2:05 PM  Call back number  Post procedure Call Back phone  # [phone]  Permission to leave phone message Yes     Patient questions:  Do you have a fever, pain , or abdominal swelling? No. Pain Score  0 *  Have you tolerated food without any problems? Yes.    Have you been able to return to your normal activities? Yes.    Do you have any questions about your discharge instructions: Diet   No. Medications  No. Follow up visit  No.  Do you have questions or concerns about your Care? No.  Actions: * If pain score is 4 or above: No action needed, pain <4.

## 2022-11-17 ENCOUNTER — Encounter: Payer: Managed Care, Other (non HMO) | Admitting: Internal Medicine

## 2022-12-30 ENCOUNTER — Encounter (HOSPITAL_COMMUNITY): Payer: Self-pay | Admitting: *Deleted

## 2022-12-30 ENCOUNTER — Ambulatory Visit (HOSPITAL_COMMUNITY)
Admission: EM | Admit: 2022-12-30 | Discharge: 2022-12-30 | Disposition: A | Discharge status: Home / Self Care | Payer: BLUE CROSS/BLUE SHIELD | Attending: Emergency Medicine | Admitting: Emergency Medicine

## 2022-12-30 ENCOUNTER — Other Ambulatory Visit: Payer: Self-pay

## 2022-12-30 DIAGNOSIS — R519 Headache, unspecified: Secondary | ICD-10-CM | POA: Diagnosis not present

## 2022-12-30 DIAGNOSIS — J069 Acute upper respiratory infection, unspecified: Secondary | ICD-10-CM | POA: Diagnosis not present

## 2022-12-30 MED ORDER — IBUPROFEN 800 MG PO TABS: 800.0000 mg | ORAL_TABLET | Freq: Three times a day (TID) | ORAL | 0 refills | Status: DC

## 2022-12-30 MED ORDER — PROMETHAZINE-DM 6.25-15 MG/5ML PO SYRP: 5.0000 mL | ORAL_SOLUTION | Freq: Four times a day (QID) | ORAL | 0 refills | Status: DC | PRN

## 2022-12-30 NOTE — ED Triage Notes (Signed)
Pt reports she has had a H for 3 days. Pt started to cough yesterday and also has muscle pain when coughing. Pt has been taking aleeve with out relief.

## 2022-12-30 NOTE — ED Provider Notes (Signed)
[facility]    CSN: [id number] Arrival date & time: 12/30/22  1233      History   Chief Complaint Chief Complaint  Patient presents with   Headache   Muscle Pain   Cough    HPI Jackie Reed is a 57 y.o. female.  3 day history of headache, dry cough Headache is 5/10. Has tried aleve without relief. History of migraines and usually just needs to rest for them to clear. Cough is causing chest discomfort. No CP at rest, no shortness of breath A little runny nose and some fatigue No known sick contacts  Hx HTN. Has not taken BP meds yet today  Past Medical History:  Diagnosis Date   E-coli UTI 08/2019   Hepatitis B core antibody positive 05/2020   Hypertension    Migraine    Plantar fibromatosis    PONV (postoperative nausea and vomiting)    Vitamin B12 deficiency 06/2019   Vitamin D deficiency 06/2019    Patient Active Problem List   Diagnosis Date Noted   History of COVID-19 10/03/2020   Hepatitis B carrier (HCC) 07/03/2020   Mass of left hand 10/02/2019   Elevated blood pressure reading in office with diagnosis of hypertension 09/11/2019   Pre-diabetes 06/14/2019   Low back pain without sciatica 03/07/2019   Back muscle spasm 03/07/2019   History of recent fall 12/31/2018   Sprain of right ankle 11/18/2018   Class 2 severe obesity due to excess calories with serious comorbidity and body mass index (BMI) of 35.0 to 35.9 in adult [facility]) 10/31/2018   Essential hypertension, benign 11/08/2015   Tendon calcification 11/08/2015   Adjustment disorder with mixed anxiety and depressed mood 11/08/2015   Insomnia 11/08/2015    Past Surgical History:  Procedure Laterality Date   ABDOMINAL HYSTERECTOMY     FOOT SURGERY  1985   MASS EXCISION Right 05/19/2018   Procedure: EXCISION PALMAR NODULES RIGHT HAND;  Surgeon: Betha Loa, MD;  Location: [facility];  Service: Orthopedics;  Laterality: Right;   PARTIAL HYSTERECTOMY  1997    OB History      Gravida  2   Para      Term      Preterm      AB      Living  2      SAB      IAB      Ectopic      Multiple      Live Births               Home Medications    Prior to Admission medications   Medication Sig Start Date End Date Taking? Authorizing Provider  ibuprofen (ADVIL) 800 MG tablet Take 1 tablet (800 mg total) by mouth 3 (three) times daily. 12/30/22  Yes Maysoon Lozada, Lurena Joiner, PA-C  promethazine-dextromethorphan (PROMETHAZINE-DM) 6.25-15 MG/5ML syrup Take 5 mLs by mouth 4 (four) times daily as needed for cough. 12/30/22  Yes Kartier Bennison, Lurena Joiner, PA-C  amLODipine (NORVASC) 5 MG tablet Take 1 tablet (5 mg total) by mouth daily. 10/06/21   Gwyneth Sprout, MD  losartan (COZAAR) 25 MG tablet Take 1 tablet (25 mg total) by mouth daily. 10/06/21   Gwyneth Sprout, MD    Family History Family History  Problem Relation Age of Onset   Heart failure Mother    Diabetes Mother    Heart failure Father    Hypertension Father    Hyperlipidemia Father    Colon cancer Neg  Hx    Colon polyps Neg Hx    Breast cancer Neg Hx    Crohn's disease Neg Hx    Esophageal cancer Neg Hx    Rectal cancer Neg Hx    Stomach cancer Neg Hx    Ulcerative colitis Neg Hx     Social History Social History   Tobacco Use   Smoking status: Never   Smokeless tobacco: Never  Vaping Use   Vaping Use: Never used  Substance Use Topics   Alcohol use: No    Alcohol/week: 0.0 standard drinks of alcohol   Drug use: No     Allergies   Amoxicillin, Morphine and related, and Tramadol   Review of Systems Review of Systems As per HPI  Physical Exam Triage Vital Signs ED Triage Vitals  Enc Vitals Group     BP 12/30/22 1445 (!) 151/104     Pulse Rate 12/30/22 1445 77     Resp 12/30/22 1445 20     Temp 12/30/22 1445 98.1 F (36.7 C)     Temp src --      SpO2 12/30/22 1445 95 %     Weight --      Height --      Head Circumference --      Peak Flow --      Pain Score 12/30/22 1443  5     Pain Loc --      Pain Edu? --      Excl. in GC? --    No data found.  Updated Vital Signs BP (!) 172/96   Pulse 73   Temp 98 F (36.7 C)   Resp 20   SpO2 96%    Physical Exam Vitals and nursing note reviewed.  Constitutional:      General: She is not in acute distress.    Appearance: She is not ill-appearing.  HENT:     Right Ear: Tympanic membrane and ear canal normal.     Left Ear: Tympanic membrane and ear canal normal.     Nose: Rhinorrhea present. No congestion.     Mouth/Throat:     Mouth: Mucous membranes are moist.     Pharynx: Oropharynx is clear. No posterior oropharyngeal erythema.  Eyes:     Conjunctiva/sclera: Conjunctivae normal.  Cardiovascular:     Rate and Rhythm: Normal rate and regular rhythm.     Heart sounds: Normal heart sounds.  Pulmonary:     Effort: Pulmonary effort is normal.     Breath sounds: Normal breath sounds.  Musculoskeletal:        General: Normal range of motion.     Cervical back: Normal range of motion.  Skin:    General: Skin is warm and dry.  Neurological:     Mental Status: She is alert and oriented to person, place, and time.     UC Treatments / Results  Labs (all labs ordered are listed, but only abnormal results are displayed) Labs Reviewed - No data to display  EKG  Radiology No results found.  Procedures Procedures   Medications Ordered in UC Medications - No data to display  Initial Impression / Assessment and Plan / UC Course  I have reviewed the triage vital signs and the nursing notes.  Pertinent labs & imaging results that were available during my care of the patient were reviewed by me and considered in my medical decision making (see chart for details).  Afebrile, well appearing. BP mildly elevated Offered injection  for pain, patient declined Likely viral etiology of symptoms. Neuro exam intact Will try alternating with tylenol and ibuprofen for headache and chest discomfort with cough.  Promethazine DM QID prn.    Final Clinical Impressions(s) / UC Diagnoses   Final diagnoses:  Viral URI with cough  Acute nonintractable headache, unspecified headache type     Discharge Instructions      Ibuprofen alternated with tylenol every 4-6 hours can help with headache and muscle pain The cough syrup can be taken 4 times daily as needed.  Please note this might make you drowsy.  If so, take only before bedtime Make sure you are drinking lots of fluids You can return if needed     ED Prescriptions     Medication Sig Dispense Auth. Provider   ibuprofen (ADVIL) 800 MG tablet Take 1 tablet (800 mg total) by mouth 3 (three) times daily. 21 tablet Hershell Brandl, PA-C   promethazine-dextromethorphan (PROMETHAZINE-DM) 6.25-15 MG/5ML syrup Take 5 mLs by mouth 4 (four) times daily as needed for cough. 180 mL Josemaria Brining, Lurena Joiner, PA-C      PDMP not reviewed this encounter.   Ashaz Robling, Lurena Joiner, [place] 12/30/22 1551

## 2022-12-30 NOTE — Discharge Instructions (Addendum)
Ibuprofen alternated with tylenol every 4-6 hours can help with headache and muscle pain The cough syrup can be taken 4 times daily as needed.  Please note this might make you drowsy.  If so, take only before bedtime Make sure you are drinking lots of fluids You can return if needed

## 2023-01-18 ENCOUNTER — Emergency Department (HOSPITAL_COMMUNITY): Payer: Managed Care, Other (non HMO)

## 2023-01-18 ENCOUNTER — Emergency Department (HOSPITAL_BASED_OUTPATIENT_CLINIC_OR_DEPARTMENT_OTHER)
Admission: EM | Admit: 2023-01-18 | Discharge: 2023-01-18 | Disposition: A | Discharge status: Home / Self Care | Payer: Managed Care, Other (non HMO) | Attending: Emergency Medicine | Admitting: Emergency Medicine

## 2023-01-18 ENCOUNTER — Emergency Department (HOSPITAL_BASED_OUTPATIENT_CLINIC_OR_DEPARTMENT_OTHER): Payer: Managed Care, Other (non HMO)

## 2023-01-18 ENCOUNTER — Encounter (HOSPITAL_BASED_OUTPATIENT_CLINIC_OR_DEPARTMENT_OTHER): Payer: Self-pay

## 2023-01-18 ENCOUNTER — Other Ambulatory Visit: Payer: Self-pay

## 2023-01-18 DIAGNOSIS — R0600 Dyspnea, unspecified: Secondary | ICD-10-CM | POA: Diagnosis not present

## 2023-01-18 DIAGNOSIS — I10 Essential (primary) hypertension: Secondary | ICD-10-CM | POA: Diagnosis not present

## 2023-01-18 DIAGNOSIS — M7989 Other specified soft tissue disorders: Secondary | ICD-10-CM

## 2023-01-18 DIAGNOSIS — R29898 Other symptoms and signs involving the musculoskeletal system: Secondary | ICD-10-CM

## 2023-01-18 DIAGNOSIS — R590 Localized enlarged lymph nodes: Secondary | ICD-10-CM

## 2023-01-18 DIAGNOSIS — R519 Headache, unspecified: Secondary | ICD-10-CM

## 2023-01-18 DIAGNOSIS — M25562 Pain in left knee: Secondary | ICD-10-CM

## 2023-01-18 LAB — URINALYSIS, W/ REFLEX TO CULTURE (INFECTION SUSPECTED)
Bilirubin Urine: NEGATIVE
Glucose, UA: NEGATIVE mg/dL
Hgb urine dipstick: NEGATIVE
Ketones, ur: NEGATIVE mg/dL
Nitrite: POSITIVE — AB
Protein, ur: NEGATIVE mg/dL
Specific Gravity, Urine: 1.015 (ref 1.005–1.030)
pH: 7.5 (ref 5.0–8.0)

## 2023-01-18 LAB — TROPONIN I (HIGH SENSITIVITY)
Troponin I (High Sensitivity): 3 ng/L (ref <18)
Troponin I (High Sensitivity): 2 ng/L (ref <18)

## 2023-01-18 LAB — COMPREHENSIVE METABOLIC PANEL
ALT: 19 U/L (ref 0–44)
AST: 38 U/L (ref 15–41)
Albumin: 3.6 g/dL (ref 3.5–5.0)
Alkaline Phosphatase: 137 U/L — ABNORMAL HIGH (ref 38–126)
Anion gap: 8 (ref 5–15)
BUN: 10 mg/dL (ref 6–20)
CO2: 22 mmol/L (ref 22–32)
Calcium: 8.7 mg/dL — ABNORMAL LOW (ref 8.9–10.3)
Chloride: 105 mmol/L (ref 98–111)
Creatinine, Ser: 0.71 mg/dL (ref 0.44–1.00)
GFR, Estimated: >60 mL/min (ref >60)
Glucose, Bld: 99 mg/dL (ref 70–99)
Potassium: 3.6 mmol/L (ref 3.5–5.1)
Sodium: 135 mmol/L (ref 135–145)
Total Bilirubin: 0.4 mg/dL (ref 0.3–1.2)
Total Protein: 7.8 g/dL (ref 6.5–8.1)

## 2023-01-18 LAB — CBC WITH DIFFERENTIAL/PLATELET
Abs Immature Granulocytes: 0.01 10*3/uL (ref 0.00–0.07)
Basophils Absolute: 0 10*3/uL (ref 0.0–0.1)
Basophils Relative: 1 %
Eosinophils Absolute: 0.3 10*3/uL (ref 0.0–0.5)
Eosinophils Relative: 6 %
HCT: 38.3 % (ref 36.0–46.0)
Hemoglobin: 11.7 g/dL — ABNORMAL LOW (ref 12.0–15.0)
Immature Granulocytes: 0 %
Lymphocytes Relative: 21 %
Lymphs Abs: 1.1 10*3/uL (ref 0.7–4.0)
MCH: 21.7 pg — ABNORMAL LOW (ref 26.0–34.0)
MCHC: 30.5 g/dL (ref 30.0–36.0)
MCV: 71.1 fL — ABNORMAL LOW (ref 80.0–100.0)
Monocytes Absolute: 1 10*3/uL (ref 0.1–1.0)
Monocytes Relative: 18 %
Neutro Abs: 2.8 10*3/uL (ref 1.7–7.7)
Neutrophils Relative %: 54 %
Platelets: 424 10*3/uL — ABNORMAL HIGH (ref 150–400)
RBC: 5.39 MIL/uL — ABNORMAL HIGH (ref 3.87–5.11)
RDW: 15.7 % — ABNORMAL HIGH (ref 11.5–15.5)
WBC: 5.3 10*3/uL (ref 4.0–10.5)
nRBC: 0 % (ref 0.0–0.2)

## 2023-01-18 LAB — BRAIN NATRIURETIC PEPTIDE: B Natriuretic Peptide: 80.6 pg/mL (ref 0.0–100.0)

## 2023-01-18 MED ORDER — AMLODIPINE BESYLATE 5 MG PO TABS
10.0000 mg | ORAL_TABLET | Freq: Every day | ORAL | 3 refills | Status: DC
Start: 2023-01-18 — End: 2024-01-20

## 2023-01-18 MED ORDER — GADOBUTROL 1 MMOL/ML IV SOLN
10.0000 mL | Freq: Once | INTRAVENOUS | Status: AC | PRN
  Administered 2023-01-18: 10 mL via INTRAVENOUS

## 2023-01-18 MED ORDER — IOHEXOL 350 MG/ML SOLN
100.0000 mL | Freq: Once | INTRAVENOUS | Status: AC | PRN
  Administered 2023-01-18: 100 mL via INTRAVENOUS

## 2023-01-18 NOTE — ED Notes (Signed)
[facility] on the floor, paperwork given, pt belongings bagged and sent w pt

## 2023-01-18 NOTE — ED Notes (Signed)
Pt transported to MRI 

## 2023-01-18 NOTE — ED Provider Notes (Signed)
[facility] Provider Note   CSN: [id number] Arrival date & time: 01/18/23  0935     History  Chief Complaint  Patient presents with   Leg Swelling   Headache   Shortness of Breath    Jackie Reed is a 57 y.o. female.  HPI     57 year old female with a history of hypertension, migraine, recent urgent care evaluation on May 8 for URI, presents with concern leg swelling and dyspnea for one week, headache for 3 weeks.   Dyspnea and leg swelling has been present for 1 week. Bilateral leg swelling. No history of similar.  Dyspnea worse on exertion.  No chest pain, abdominal pain.   Had cough 5/8, has continued since then, improving but continued. No fever, no nausea, no vomiting  Headache present for 3 weeks, hx of migraine but this is different  as it is more persistent, constant headache for 3 weeks, not the same severity of migraine.  It is worse with looking at the computer screen, working "does not have the same type of light or sound sensitivity that her typical migraine does.  She has not noticed, numbness, weakness, visual changes, difficulty talking or walking or facial droop.  Back pain preseint since 2020, had MRI then. Improved from what it had been and mild. Urinary frequency, no other symptoms. Did not take blood pressure medications today.  Past Medical History:  Diagnosis Date   E-coli UTI 08/2019   Hepatitis B core antibody positive 05/2020   Hypertension    Migraine    Plantar fibromatosis    PONV (postoperative nausea and vomiting)    Vitamin B12 deficiency 06/2019   Vitamin D deficiency 06/2019     Home Medications Prior to Admission medications   Medication Sig Start Date End Date Taking? Authorizing Provider  amLODipine (NORVASC) 5 MG tablet Take 2 tablets (10 mg total) by mouth daily. 01/18/23   Alvira Monday, MD  ibuprofen (ADVIL) 800 MG tablet Take 1 tablet (800 mg total) by mouth 3 (three) times  daily. 12/30/22   Rising, Lurena Joiner, PA-C  losartan (COZAAR) 25 MG tablet Take 1 tablet (25 mg total) by mouth daily. 10/06/21   Gwyneth Sprout, MD  promethazine-dextromethorphan (PROMETHAZINE-DM) 6.25-15 MG/5ML syrup Take 5 mLs by mouth 4 (four) times daily as needed for cough. 12/30/22   Rising, Lurena Joiner, PA-C      Allergies    Amoxicillin, Morphine and codeine, and Tramadol    Review of Systems   Review of Systems  Physical Exam Updated Vital Signs BP (!) 141/90   Pulse 79   Temp 97.7 F (36.5 C)   Resp 18   Ht 5\' 5"  (1.651 m)   Wt 97.5 kg   SpO2 100%   BMI 35.78 kg/m  Physical Exam Vitals and nursing note reviewed.  Constitutional:      General: She is not in acute distress.    Appearance: She is well-developed. She is not diaphoretic.  HENT:     Head: Normocephalic and atraumatic.  Eyes:     Conjunctiva/sclera: Conjunctivae normal.  Cardiovascular:     Rate and Rhythm: Normal rate and regular rhythm.     Heart sounds: Normal heart sounds. No murmur heard.    No friction rub. No gallop.  Pulmonary:     Effort: Pulmonary effort is normal. No respiratory distress.     Breath sounds: Normal breath sounds. No wheezing or rales.  Abdominal:     General:  There is no distension.     Palpations: Abdomen is soft.     Tenderness: There is no abdominal tenderness. There is no guarding.  Musculoskeletal:        General: No tenderness.     Cervical back: Normal range of motion.     Right lower leg: Edema present.     Left lower leg: Edema present.  Skin:    General: Skin is warm and dry.     Findings: No erythema or rash.  Neurological:     Mental Status: She is alert and oriented to person, place, and time.     Cranial Nerves: Cranial nerve deficit: uvula deviates to right.     Sensory: No sensory deficit.     Motor: Weakness: mild LLE weakness.     Coordination: Coordination normal.     ED Results / Procedures / Treatments   Labs (all labs ordered are listed, but  only abnormal results are displayed) Labs Reviewed  CBC WITH DIFFERENTIAL/PLATELET - Abnormal; Notable for the following components:      Result Value   RBC 5.39 (*)    Hemoglobin 11.7 (*)    MCV 71.1 (*)    MCH 21.7 (*)    RDW 15.7 (*)    Platelets 424 (*)    All other components within normal limits  COMPREHENSIVE METABOLIC PANEL - Abnormal; Notable for the following components:   Calcium 8.7 (*)    Alkaline Phosphatase 137 (*)    All other components within normal limits  URINALYSIS, W/ REFLEX TO CULTURE (INFECTION SUSPECTED) - Abnormal; Notable for the following components:   Nitrite POSITIVE (*)    Leukocytes,Ua TRACE (*)    Bacteria, UA RARE (*)    All other components within normal limits  BRAIN NATRIURETIC PEPTIDE  TROPONIN I (HIGH SENSITIVITY)  TROPONIN I (HIGH SENSITIVITY)    EKG EKG Interpretation  Date/Time:  Monday Jan 18 2023 09:44:37 EDT Ventricular Rate:  83 PR Interval:  154 QRS Duration: 95 QT Interval:  390 QTC Calculation: 459 R Axis:   1 Text Interpretation: Sinus rhythm Left ventricular hypertrophy Anterior Q waves, possibly due to LVH No significant change since last tracing Confirmed by Alvira Monday ([id number]) on 01/18/2023 9:49:24 AM  Radiology MR Brain W and Wo Contrast  Result Date: 01/18/2023 CLINICAL DATA:  Neuro deficit, acute, stroke suspected. Left leg weakness. New findings of metastatic disease. EXAM: MRI HEAD WITHOUT AND WITH CONTRAST TECHNIQUE: Multiplanar, multiecho pulse sequences of the brain and surrounding structures were obtained without and with intravenous contrast. CONTRAST:  10mL GADAVIST GADOBUTROL 1 MMOL/ML IV SOLN COMPARISON:  Head CT 01/18/2023 and MRI 11/18/2015 FINDINGS: Brain: There is no evidence of an acute infarct, intracranial hemorrhage, mass, midline shift, or extra-axial fluid collection. There is mild central predominant cerebral atrophy. Scattered small T2 hyperintensities in the cerebral white matter have slightly  progressed from the prior MRI. No abnormal enhancement is identified. Vascular: Major intracranial vascular flow voids are preserved. Skull and upper cervical spine: No suspicious marrow lesion. Sinuses/Orbits: Unremarkable orbits. Small mucous retention cyst in the left maxillary sinus. Clear mastoid air cells. Other: None. IMPRESSION: 1. No acute intracranial abnormality or evidence of intracranial metastases. 2. Mild cerebral white matter T2 signal changes, slightly progressed from 2017 and nonspecific though may reflect chronic small vessel ischemic disease or migraines. Electronically Signed   By: Sebastian Ache M.D.   On: 01/18/2023 16:49   CT Angio Chest PE W and/or Wo Contrast  Result Date:  01/18/2023 CLINICAL DATA:  PE suspected, headache, leg swelling * Tracking Code: BO * EXAM: CT ANGIOGRAPHY CHEST WITH CONTRAST TECHNIQUE: Multidetector CT imaging of the chest was performed using the standard protocol during bolus administration of intravenous contrast. Multiplanar CT image reconstructions and MIPs were obtained to evaluate the vascular anatomy. RADIATION DOSE REDUCTION: This exam was performed according to the departmental dose-optimization program which includes automated exposure control, adjustment of the mA and/or kV according to patient size and/or use of iterative reconstruction technique. CONTRAST:  OMNIPAQUE IOHEXOL 350 MG/ML SOLN COMPARISON:  None Available. FINDINGS: Cardiovascular: Satisfactory opacification of the pulmonary arteries to the segmental level. No evidence of pulmonary embolism. Normal heart size. No pericardial effusion. Scattered aortic atherosclerosis. Mediastinum/Nodes: Bulky right hilar and mediastinal lymphadenopathy, largest subcarinal node 3.6 x 2.4 cm (series 6, image 120). Thyroid gland, trachea, and esophagus demonstrate no significant findings. Lungs/Pleura: Mild diffuse bilateral bronchial wall thickening. Interlobular septal thickening about the right lung  base (series 5, image 63). Multiple small pulmonary nodules in the right lower lobe measuring up to 0.4 cm (series 5, image 65). No pleural effusion or pneumothorax. Upper Abdomen: No acute abnormality. Musculoskeletal: No chest wall abnormality. No acute osseous findings. Review of the MIP images confirms the above findings. IMPRESSION: 1. Negative examination for pulmonary embolism. 2. Bulky right hilar and mediastinal lymphadenopathy, largest subcarinal node 3.6 x 2.4 cm. This is highly concerning for malignancy and nodal metastatic disease. 3. Interlobular septal thickening about the right lung base, which may reflect vascular congestion in the setting of hilar mass/lymphadenopathy but is worrisome for lymphangitic metastatic disease. 4. Multiple small pulmonary nodules in the right lower lobe measuring up to 0.4 cm, nonspecific although very worrisome for intrapulmonary metastases given distribution in the right lower lobe. Aortic Atherosclerosis (ICD10-I70.0). Electronically Signed   By: Jearld Lesch M.D.   On: 01/18/2023 13:17   CT Head Wo Contrast  Result Date: 01/18/2023 CLINICAL DATA:  Frontal headache for 3-4 weeks EXAM: CT HEAD WITHOUT CONTRAST TECHNIQUE: Contiguous axial images were obtained from the base of the skull through the vertex without intravenous contrast. RADIATION DOSE REDUCTION: This exam was performed according to the departmental dose-optimization program which includes automated exposure control, adjustment of the mA and/or kV according to patient size and/or use of iterative reconstruction technique. COMPARISON:  10/06/2021 FINDINGS: Brain: No evidence of acute infarction, hemorrhage, obstructive hydrocephalus, extra-axial collection or mass lesion/mass effect. Disproportionate subarachnoid spaces but no ventriculomegaly to a degree that NPH is implied. Vascular: No hyperdense vessel or unexpected calcification. Skull: Normal. Negative for fracture or focal lesion. Sinuses/Orbits:  No acute finding. IMPRESSION: Stable head CT.  No acute finding or specific cause for headache. Electronically Signed   By: Tiburcio Pea M.D.   On: 01/18/2023 11:15   DG Chest 2 View  Result Date: 01/18/2023 CLINICAL DATA:  57 year old female with history of shortness of breath. EXAM: CHEST - 2 VIEW COMPARISON:  Chest x-ray 02/27/2021. FINDINGS: Marked fullness in the perihilar aspect of the right lung, with increased density projecting over the lower thoracic spine noted on the lateral view. Left lung is clear. No pleural effusions. No pneumothorax. Heart size is normal. Upper mediastinal contours are within normal limits. IMPRESSION: 1. Probable right lower lobe bronchopneumonia. However, given the fullness in the perihilar aspect of the right lung, centrally obstructing mass or lymphadenopathy is not excluded. Followup PA and lateral chest X-ray is recommended in 3-4 weeks following trial of antibiotic therapy to ensure resolution and exclude underlying  malignancy. Electronically Signed   By: Trudie Reed M.D.   On: 01/18/2023 10:58    Procedures Procedures    Medications Ordered in ED Medications  iohexol (OMNIPAQUE) 350 MG/ML injection 100 mL (100 mLs Intravenous Contrast Given 01/18/23 1240)  gadobutrol (GADAVIST) 1 MMOL/ML injection 10 mL (10 mLs Intravenous Contrast Given 01/18/23 1530)    ED Course/ Medical Decision Making/ A&P                               57 year old female with a history of hypertension, migraine, recent urgent care evaluation on May 8 for URI, presents with concern leg swelling and dyspnea for one week, headache for 3 weeks.  Differential diagnosis for dyspnea includes ACS, PE, COPD exacerbation, CHF exacerbation, anemia, pneumonia, viral etiology such as COVID 19 infection, metabolic abnormality.    EKG was evaluated by me which showed no significant findings.    Chest x-ray was done which showed probably right lower lobe bronchopneumonia however given  fullness perihilar difficult to exclude mass/lyphadenopathy and follow up XR recommended. ---Clinically she is not having fever, no leukocytosis, and given pneumonia diagnosis not clinically clear will order CT PE study to evaluate for this as etiology of dyspnea and also further evaluate the area perihilar fullness.   Labs completed and personally evaluated and interpreted by me show no clinically significant anemia, electrolyte abnormality, BNP within normal limits and not consistent with CHF, troponin within normal limits and doubt ACS.  CT PE study shows no evidence of PE-does show bulky hilar and mediastinal lymphadenopathy, highly concerning for malignancy and nodal metastatic disease, interlobular septal thickening , multiple pulmonary nodules, worrisome for metastatic disease.   DDx for headache includes ICH, CVT, ruptured aneurysm, mass, migraines, dehydration in setting of recent URI, headache related to hypertension.  CT head completed and personally evaluated by me shows no acute abnormalities.  While she does not report weakness on history, found to have mild LLE weakness on exam, and in setting of weakness and headache, will obtain MRI WWO contrast for further evaluation. Reports hx of lower back pain (MRI 2020), however no new or worsening pain, loss of control of bowel or bladder.  Do feel in setting of headache with weakness that she had not noticed prior it is appropriate to evaluate for other emergent abnormalities on MRI Brain. Gived mild back pain improved from prior do not feel emergent spinal imaging indicated and do feel if MRI is negative she is appropriate for outpatient follow up with Neurology for her headaches. Recommend PCP follow up for leg swelling, symptoms as well.  Consulted Oncology Dr. Leonides Schanz regarding CT findings-who will follow up closely-if admitted will follow, if going home will see as an outpatient.          Final Clinical Impression(s) / ED  Diagnoses Final diagnoses:  Acute nonintractable headache, unspecified headache type  Dyspnea, unspecified type  Leg swelling  Left leg weakness  Hypertension, unspecified type  Mediastinal lymphadenopathy    Rx / DC Orders ED Discharge Orders          Ordered    amLODipine (NORVASC) 5 MG tablet  Daily        01/18/23 1233    Ambulatory referral to Hematology / Oncology        01/18/23 1340              Alvira Monday, MD 01/18/23 1757

## 2023-01-18 NOTE — Discharge Instructions (Addendum)
You are seen in the emergency department for your symptoms.  He had a CT scan that showed enlarged lymph nodes that are concerning for possible cancer.  Please follow-up with Dr. Leonides Schanz about this.  Follow-up with the neurologist about your weakness.  Increase your amlodipine from 5mg  to 10mg  daily.   Follow-up with your primary doctor in 2 to 3 days regarding your visit.  Return to the emergency department if you experience any concerning symptoms.

## 2023-01-18 NOTE — ED Triage Notes (Signed)
Pt transferred via [facility] for MRI.

## 2023-01-18 NOTE — ED Provider Notes (Signed)
  Physical Exam  BP (!) 175/95   Pulse 70   Temp 97.7 F (36.5 C)   Resp 16   Ht 5\' 5"  (1.651 m)   Wt 97.5 kg   SpO2 100%   BMI 35.78 kg/m   Physical Exam  Procedures  Procedures  ED Course / MDM    Medical Decision Making Amount and/or Complexity of Data Reviewed Labs: ordered. Radiology: ordered.  Risk Prescription drug management.   57 year old female with a history of hypertension and migraines who presents to the emergency department as a transfer from med [facility] for MRI due to left lower extremity weakness and headache.  Appears that the symptoms have been going on since Friday.  Says that she has pain in her left knee that she thinks is causing the weakness.  No swelling, no warmth, denies any fevers.  On exam is full range of motion of her left knee without a significant effusion.  No known injuries to the area.  Patient had an x-ray of the knee here in the emergency department that did not show any abnormalities.  With the left lower extremity weakness the provider at [facility] did order an MRI of the brain that did not show any acute abnormalities.  Patient denies any back pain, saddle anesthesia, or bowel or bladder incontinence to me.  Unclear exactly what is causing her leg pain but will have her follow-up with her primary doctor regarding this to ensure that is improving.  If going provider also wanted her to follow-up with neurology regarding her left lower extremity weakness so we will keep that appointment.  Did discuss her findings on the CT of her chest that showed possible malignant mediastinal lymph nodes.  Unclear exactly what the primary would be for this and will have her follow-up with Dr. Leonides Schanz from oncology.   Rondel Baton, MD 01/19/23 1215

## 2023-01-18 NOTE — ED Notes (Signed)
Called [facility] for transport to [facility] for MRI, spoke with Selena Batten.

## 2023-01-18 NOTE — ED Triage Notes (Signed)
Patient has had a headache for three weeks with swelling to bil legs. She is also complaining of shortness of breath.

## 2023-01-19 NOTE — Progress Notes (Signed)
[facility] [phone]   [phone]  INITIAL CONSULTATION:  Patient Care Team: Maud Deed, [place] as PCP - General (Physician Assistant)  CHIEF COMPLAINTS/PURPOSE OF CONSULTATION:  "Hilar and mediastinal lymphadenopathy "  HISTORY OF PRESENTING ILLNESS:  Jackie Reed 57 y.o. female with medical history significant for hypertension, adjustment disorder, obesity, pre diabetes, hepatitis B carrier, personal history of non- advanced adenoma.  On review of the previous records patient seen in the ED on 01/18/23 for leg swelling, headache, and shortness of breath. Work up included CTA which shows findings significant for bulky hilar and mediastinal lymphadenopathy, highly concerning for malignancy and nodal metastatic disease, interlobular septal thickening, multiple pulmonary nodules, worrisome for metastatic disease. Further chart review shows patient is followed by [facility] and had colonoscopy on 09/14/22, previous was in 2017. It was recommended she have repeat colonoscopy within 3 months for surveillance.  On exam today patient is accompanied by her sister who provides additional history.  Patient states that she has had approximately 1 month of shortness of breath nonproductive cough.  She was evaluated by urgent care when symptoms started and diagnosed with URI.  She took cough medicine that decrease the frequency although the cough has not completely resolved.  Patient states 3 days ago her shortness of breath worsened.  When she woke up the next day she was still feeling short of breath and that is what prompted her ED visit.  Patient adds that she had COVID back in January 2024.  Ever since then she has had decreased appetite and early satiety.  She denies any fever, weight loss or night sweats.  On further discussion patient reports being adopted and is unsure of complete family medical history.  Patient states she has never smoked  and does not drink alcohol.  She has yearly mammograms that have been normal so far, last was in 2023.  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   MEDICAL HISTORY:  Past Medical History:  Diagnosis Date   E-coli UTI 08/2019   Hepatitis B core antibody positive 05/2020   Hypertension    Migraine    Plantar fibromatosis    PONV (postoperative nausea and vomiting)    Vitamin B12 deficiency 06/2019   Vitamin D deficiency 06/2019    SURGICAL HISTORY: Past Surgical History:  Procedure Laterality Date   ABDOMINAL HYSTERECTOMY     FOOT SURGERY  1985   MASS EXCISION Right 05/19/2018   Procedure: EXCISION PALMAR NODULES RIGHT HAND;  Surgeon: Betha Loa, MD;  Location: [facility];  Service: Orthopedics;  Laterality: Right;   PARTIAL HYSTERECTOMY  1997    SOCIAL HISTORY: Social History   Socioeconomic History   Marital status: Widowed    Spouse name: Not on file   Number of children: Not on file   Years of education: Not on file   Highest education level: Not on file  Occupational History   Not on file  Tobacco Use   Smoking status: Never   Smokeless tobacco: Never  Vaping Use   Vaping Use: Never used  Substance and Sexual Activity   Alcohol use: No    Alcohol/week: 0.0 standard drinks of alcohol   Drug use: No   Sexual activity: Yes    Birth control/protection: None  Other Topics Concern   Not on file  Social History Narrative   Not on file   Social Determinants of Health   Financial Resource Strain: Not  on file  Food Insecurity: Not on file  Transportation Needs: Not on file  Physical Activity: Not on file  Stress: Not on file  Social Connections: Not on file  Intimate Partner Violence: Not on file    FAMILY HISTORY: Family History  Problem Relation Age of Onset   Heart failure Mother    Diabetes Mother    Heart failure Father    Hypertension Father    Hyperlipidemia Father    Colon cancer Neg Hx    Colon polyps Neg Hx     Breast cancer Neg Hx    Crohn's disease Neg Hx    Esophageal cancer Neg Hx    Rectal cancer Neg Hx    Stomach cancer Neg Hx    Ulcerative colitis Neg Hx     ALLERGIES:  is allergic to amoxicillin, morphine and codeine, and tramadol.  MEDICATIONS:  Current Outpatient Medications  Medication Sig Dispense Refill   amLODipine (NORVASC) 5 MG tablet Take 2 tablets (10 mg total) by mouth daily. 90 tablet 3   ibuprofen (ADVIL) 800 MG tablet Take 1 tablet (800 mg total) by mouth 3 (three) times daily. 21 tablet 0   losartan (COZAAR) 25 MG tablet Take 1 tablet (25 mg total) by mouth daily. 90 tablet 3   promethazine-dextromethorphan (PROMETHAZINE-DM) 6.25-15 MG/5ML syrup Take 5 mLs by mouth 4 (four) times daily as needed for cough. 180 mL 0   No current facility-administered medications for this visit.    REVIEW OF SYSTEMS:   All other systems are reviewed and are negative for acute change except as noted in the HPI.  PHYSICAL EXAMINATION: ECOG PERFORMANCE STATUS: 1 - Symptomatic but completely ambulatory  Vitals:   01/20/23 1301  BP: (!) 186/100  Pulse: (!) 106  Resp: 18  Temp: 98.3 F (36.8 C)  SpO2: 100%     Physical Exam Vitals reviewed.  Constitutional:      Appearance: She is not ill-appearing or toxic-appearing.  HENT:     Head: Normocephalic.     Nose: Nose normal.  Eyes:     Conjunctiva/sclera: Conjunctivae normal.  Cardiovascular:     Rate and Rhythm: Tachycardia present.     Pulses: Normal pulses.     Heart sounds: Normal heart sounds.  Pulmonary:     Effort: Pulmonary effort is normal.     Breath sounds: Normal breath sounds.  Abdominal:     General: There is no distension.     Palpations: Abdomen is soft.  Musculoskeletal:        General: Normal range of motion.     Cervical back: Normal range of motion.  Skin:    General: Skin is warm and dry.  Neurological:     Mental Status: She is alert.       LABORATORY DATA:  I have reviewed the data  as listed    Latest Ref Rng & Units 01/20/2023    2:24 PM 01/18/2023   10:30 AM 10/06/2021    3:18 PM  CBC  WBC 4.0 - 10.5 K/uL 6.2  5.3  4.6   Hemoglobin 12.0 - 15.0 g/dL 16.1  09.6  04.5   Hematocrit 36.0 - 46.0 % 39.2  38.3  42.6   Platelets 150 - 400 K/uL 438  424  365        Latest Ref Rng & Units 01/20/2023    2:24 PM 01/18/2023   10:30 AM 10/06/2021    3:18 PM  CMP  Glucose 70 - 99  mg/dL 829  99  97   BUN 6 - 20 mg/dL 9  10  7    Creatinine 0.44 - 1.00 mg/dL 5.62  1.30  8.65   Sodium 135 - 145 mmol/L 138  135  139   Potassium 3.5 - 5.1 mmol/L 3.6  3.6  3.5   Chloride 98 - 111 mmol/L 105  105  107   CO2 22 - 32 mmol/L 25  22  24    Calcium 8.9 - 10.3 mg/dL 9.4  8.7  9.2   Total Protein 6.5 - 8.1 g/dL 8.5  7.8    Total Bilirubin 0.3 - 1.2 mg/dL 0.7  0.4    Alkaline Phos 38 - 126 U/L 152  137    AST 15 - 41 U/L 35  38    ALT 0 - 44 U/L 18  19       RADIOGRAPHIC STUDIES: I have personally reviewed the radiological images as listed and agreed with the findings in the report. DG Knee Complete 4 Views Left  Result Date: 01/18/2023 CLINICAL DATA:  Left knee pain EXAM: LEFT KNEE - COMPLETE 4+ VIEW COMPARISON:  None Available. FINDINGS: No evidence of fracture, dislocation, or joint effusion. No evidence of arthropathy or other focal bone abnormality. Soft tissues are unremarkable. IMPRESSION: Negative. Electronically Signed   By: Charlett Nose M.D.   On: 01/18/2023 19:49   MR Brain W and Wo Contrast  Result Date: 01/18/2023 CLINICAL DATA:  Neuro deficit, acute, stroke suspected. Left leg weakness. New findings of metastatic disease. EXAM: MRI HEAD WITHOUT AND WITH CONTRAST TECHNIQUE: Multiplanar, multiecho pulse sequences of the brain and surrounding structures were obtained without and with intravenous contrast. CONTRAST:  10mL GADAVIST GADOBUTROL 1 MMOL/ML IV SOLN COMPARISON:  Head CT 01/18/2023 and MRI 11/18/2015 FINDINGS: Brain: There is no evidence of an acute infarct,  intracranial hemorrhage, mass, midline shift, or extra-axial fluid collection. There is mild central predominant cerebral atrophy. Scattered small T2 hyperintensities in the cerebral white matter have slightly progressed from the prior MRI. No abnormal enhancement is identified. Vascular: Major intracranial vascular flow voids are preserved. Skull and upper cervical spine: No suspicious marrow lesion. Sinuses/Orbits: Unremarkable orbits. Small mucous retention cyst in the left maxillary sinus. Clear mastoid air cells. Other: None. IMPRESSION: 1. No acute intracranial abnormality or evidence of intracranial metastases. 2. Mild cerebral white matter T2 signal changes, slightly progressed from 2017 and nonspecific though may reflect chronic small vessel ischemic disease or migraines. Electronically Signed   By: Sebastian Ache M.D.   On: 01/18/2023 16:49   CT Angio Chest PE W and/or Wo Contrast  Result Date: 01/18/2023 CLINICAL DATA:  PE suspected, headache, leg swelling * Tracking Code: BO * EXAM: CT ANGIOGRAPHY CHEST WITH CONTRAST TECHNIQUE: Multidetector CT imaging of the chest was performed using the standard protocol during bolus administration of intravenous contrast. Multiplanar CT image reconstructions and MIPs were obtained to evaluate the vascular anatomy. RADIATION DOSE REDUCTION: This exam was performed according to the departmental dose-optimization program which includes automated exposure control, adjustment of the mA and/or kV according to patient size and/or use of iterative reconstruction technique. CONTRAST:  OMNIPAQUE IOHEXOL 350 MG/ML SOLN COMPARISON:  None Available. FINDINGS: Cardiovascular: Satisfactory opacification of the pulmonary arteries to the segmental level. No evidence of pulmonary embolism. Normal heart size. No pericardial effusion. Scattered aortic atherosclerosis. Mediastinum/Nodes: Bulky right hilar and mediastinal lymphadenopathy, largest subcarinal node 3.6 x 2.4 cm  (series 6, image 120). Thyroid gland, trachea, and esophagus  demonstrate no significant findings. Lungs/Pleura: Mild diffuse bilateral bronchial wall thickening. Interlobular septal thickening about the right lung base (series 5, image 63). Multiple small pulmonary nodules in the right lower lobe measuring up to 0.4 cm (series 5, image 65). No pleural effusion or pneumothorax. Upper Abdomen: No acute abnormality. Musculoskeletal: No chest wall abnormality. No acute osseous findings. Review of the MIP images confirms the above findings. IMPRESSION: 1. Negative examination for pulmonary embolism. 2. Bulky right hilar and mediastinal lymphadenopathy, largest subcarinal node 3.6 x 2.4 cm. This is highly concerning for malignancy and nodal metastatic disease. 3. Interlobular septal thickening about the right lung base, which may reflect vascular congestion in the setting of hilar mass/lymphadenopathy but is worrisome for lymphangitic metastatic disease. 4. Multiple small pulmonary nodules in the right lower lobe measuring up to 0.4 cm, nonspecific although very worrisome for intrapulmonary metastases given distribution in the right lower lobe. Aortic Atherosclerosis (ICD10-I70.0). Electronically Signed   By: Jearld Lesch M.D.   On: 01/18/2023 13:17   CT Head Wo Contrast  Result Date: 01/18/2023 CLINICAL DATA:  Frontal headache for 3-4 weeks EXAM: CT HEAD WITHOUT CONTRAST TECHNIQUE: Contiguous axial images were obtained from the base of the skull through the vertex without intravenous contrast. RADIATION DOSE REDUCTION: This exam was performed according to the departmental dose-optimization program which includes automated exposure control, adjustment of the mA and/or kV according to patient size and/or use of iterative reconstruction technique. COMPARISON:  10/06/2021 FINDINGS: Brain: No evidence of acute infarction, hemorrhage, obstructive hydrocephalus, extra-axial collection or mass lesion/mass effect.  Disproportionate subarachnoid spaces but no ventriculomegaly to a degree that NPH is implied. Vascular: No hyperdense vessel or unexpected calcification. Skull: Normal. Negative for fracture or focal lesion. Sinuses/Orbits: No acute finding. IMPRESSION: Stable head CT.  No acute finding or specific cause for headache. Electronically Signed   By: Tiburcio Pea M.D.   On: 01/18/2023 11:15   DG Chest 2 View  Result Date: 01/18/2023 CLINICAL DATA:  57 year old female with history of shortness of breath. EXAM: CHEST - 2 VIEW COMPARISON:  Chest x-ray 02/27/2021. FINDINGS: Marked fullness in the perihilar aspect of the right lung, with increased density projecting over the lower thoracic spine noted on the lateral view. Left lung is clear. No pleural effusions. No pneumothorax. Heart size is normal. Upper mediastinal contours are within normal limits. IMPRESSION: 1. Probable right lower lobe bronchopneumonia. However, given the fullness in the perihilar aspect of the right lung, centrally obstructing mass or lymphadenopathy is not excluded. Followup PA and lateral chest X-ray is recommended in 3-4 weeks following trial of antibiotic therapy to ensure resolution and exclude underlying malignancy. Electronically Signed   By: Trudie Reed M.D.   On: 01/18/2023 10:58    ASSESSMENT & PLAN ASENA IGEL is a 56 y.o. female presenting [facility] for consultation regarding hilar and mediastinal lymphadenopathy . We have reviewed etiologies including myeloproliferative disorder, infection, inflammatory condition such as sarcoidosis, metastatic disease. Patient will proceed with laboratory workup today   #Hilar and mediastinal lymphadenopathy -Labs collected today include CBC, CMP, ESR, CRP, LDH, flow cytometry, Hep B & C serologies, HIV serology. -PET scan ordered. -Referral sent to cardiothoracic surgery to discuss biopsy. -Patient will RTC when work up is complete.  Patient expressed  understanding of the recommended workup and is agreeable to move forward.  All questions were answered. The patient knows to call the clinic with any problems, questions or concerns.  Shared visit with Dr. Leonides Schanz  Orders Placed This Encounter  Procedures   NM PET Image Initial (PI) Skull Base To Thigh    Standing Status:   Future    Number of Occurrences:   1    Standing Expiration Date:   01/20/2024    Order Specific Question:   If indicated for the ordered procedure, I authorize the administration of a radiopharmaceutical per Radiology protocol    Answer:   Yes    Order Specific Question:   Is the patient pregnant?    Answer:   No    Order Specific Question:   Preferred imaging location?    Answer:   [place] [facility]   CBC with Differential (Cancer Center Only)    Standing Status:   Future    Number of Occurrences:   1    Standing Expiration Date:   01/20/2024   CMP (Cancer Center only)    Standing Status:   Future    Number of Occurrences:   1    Standing Expiration Date:   01/20/2024   Flow Cytometry, Peripheral Blood (Oncology)    Please run regardless of CBC results    Standing Status:   Future    Number of Occurrences:   1    Standing Expiration Date:   01/20/2024   Sedimentation rate    Standing Status:   Future    Number of Occurrences:   1    Standing Expiration Date:   01/20/2024   C-reactive protein    Standing Status:   Future    Number of Occurrences:   1    Standing Expiration Date:   01/20/2024   Lactate dehydrogenase (LDH)    Standing Status:   Future    Number of Occurrences:   1    Standing Expiration Date:   01/20/2024   HIV antibody (with reflex)    Standing Status:   Future    Number of Occurrences:   1    Standing Expiration Date:   01/20/2024   Hepatitis B surface antibody    Standing Status:   Future    Number of Occurrences:   1    Standing Expiration Date:   01/20/2024   Miscellaneous [facility] test (send-out)   Miscellaneous [facility] test (send-out)    Miscellaneous [facility] test (send-out)   Ambulatory referral to Cardiothoracic Surgery    Referral Priority:   Urgent    Referral Type:   Surgical    Referral Reason:   Specialty Services Required    Referred to Provider:   Loreli Slot, MD    Requested Specialty:   Cardiothoracic Surgery    Number of Visits Requested:   1      I have spent a total of 60 minutes minutes of face-to-face and non-face-to-face time, preparing to see the patient, obtaining and/or reviewing separately obtained history, performing a medically appropriate examination, counseling and educating the patient, ordering medications/tests/procedures, referring and communicating with other health care professionals, documenting clinical information in the electronic health record, independently interpreting results and communicating results to the patient, and care coordination.   Namon Cirri PA-C Department of Hematology/Oncology [facility] at [facility] Phone: [phone]  I have read the above note and personally examined the patient. I agree with the assessment and plan as noted above.  Briefly Jackie Reed is a 57 year old female who presents for evaluation of hilar and mediastinal lymphadenopathy.  She initially presented to the emergency department on 01/18/2023 with leg swelling and chest  pain.  She underwent a CT PE study which showed hilar and mediastinal lymphadenopathy.  Today we will order full lymphadenopathy workup to include LDH, CBC, and CMP as well as flow cytometry.  Would also recommend referral to CT surgery for biopsy of mediastinal lymph node.  We will order PET CT scan in order to see if there are any other possible sites for biopsy.  The patient voiced understanding of our findings and the plan moving forward.   Ulysees Barns, MD Department of Hematology/Oncology [facility] at [facility] Phone: [phone] Pager:  [phone] Email: [email or web address]

## 2023-01-20 ENCOUNTER — Inpatient Hospital Stay: Payer: Managed Care, Other (non HMO) | Attending: Physician Assistant | Admitting: Physician Assistant

## 2023-01-20 ENCOUNTER — Other Ambulatory Visit: Payer: Self-pay

## 2023-01-20 ENCOUNTER — Inpatient Hospital Stay: Payer: Managed Care, Other (non HMO)

## 2023-01-20 VITALS — BP 186/100 | HR 106 | Temp 98.3°F | Resp 18

## 2023-01-20 DIAGNOSIS — R591 Generalized enlarged lymph nodes: Secondary | ICD-10-CM

## 2023-01-20 LAB — CBC WITH DIFFERENTIAL (CANCER CENTER ONLY)
Abs Immature Granulocytes: 0.02 10*3/uL (ref 0.00–0.07)
Basophils Absolute: 0 10*3/uL (ref 0.0–0.1)
Basophils Relative: 1 %
Eosinophils Absolute: 0.2 10*3/uL (ref 0.0–0.5)
Eosinophils Relative: 3 %
HCT: 39.2 % (ref 36.0–46.0)
Hemoglobin: 12.4 g/dL (ref 12.0–15.0)
Immature Granulocytes: 0 %
Lymphocytes Relative: 15 %
Lymphs Abs: 0.9 10*3/uL (ref 0.7–4.0)
MCH: 22.5 pg — ABNORMAL LOW (ref 26.0–34.0)
MCHC: 31.6 g/dL (ref 30.0–36.0)
MCV: 71.1 fL — ABNORMAL LOW (ref 80.0–100.0)
Monocytes Absolute: 0.9 10*3/uL (ref 0.1–1.0)
Monocytes Relative: 15 %
Neutro Abs: 4.1 10*3/uL (ref 1.7–7.7)
Neutrophils Relative %: 66 %
Platelet Count: 438 10*3/uL — ABNORMAL HIGH (ref 150–400)
RBC: 5.51 MIL/uL — ABNORMAL HIGH (ref 3.87–5.11)
RDW: 15.5 % (ref 11.5–15.5)
WBC Count: 6.2 10*3/uL (ref 4.0–10.5)
nRBC: 0 % (ref 0.0–0.2)

## 2023-01-20 LAB — CMP (CANCER CENTER ONLY)
ALT: 18 U/L (ref 0–44)
AST: 35 U/L (ref 15–41)
Albumin: 4 g/dL (ref 3.5–5.0)
Alkaline Phosphatase: 152 U/L — ABNORMAL HIGH (ref 38–126)
Anion gap: 8 (ref 5–15)
BUN: 9 mg/dL (ref 6–20)
CO2: 25 mmol/L (ref 22–32)
Calcium: 9.4 mg/dL (ref 8.9–10.3)
Chloride: 105 mmol/L (ref 98–111)
Creatinine: 0.64 mg/dL (ref 0.44–1.00)
GFR, Estimated: >60 mL/min (ref >60)
Glucose, Bld: 105 mg/dL — ABNORMAL HIGH (ref 70–99)
Potassium: 3.6 mmol/L (ref 3.5–5.1)
Sodium: 138 mmol/L (ref 135–145)
Total Bilirubin: 0.7 mg/dL (ref 0.3–1.2)
Total Protein: 8.5 g/dL — ABNORMAL HIGH (ref 6.5–8.1)

## 2023-01-20 LAB — LACTATE DEHYDROGENASE: LDH: 129 U/L (ref 98–192)

## 2023-01-20 LAB — SEDIMENTATION RATE: Sed Rate: 24 mm/hr — ABNORMAL HIGH (ref 0–22)

## 2023-01-20 LAB — C-REACTIVE PROTEIN: CRP: 7 mg/dL — ABNORMAL HIGH (ref <1.0)

## 2023-01-21 LAB — HEPATITIS B SURFACE ANTIBODY,QUALITATIVE: Hep B S Ab: REACTIVE — AB

## 2023-01-21 LAB — HIV ANTIBODY (ROUTINE TESTING W REFLEX): HIV Screen 4th Generation wRfx: NONREACTIVE

## 2023-01-22 LAB — MISC LABCORP TEST (SEND OUT)
Labcorp test code: 140659
Labcorp test code: 6510
Labcorp test code: 6718

## 2023-01-22 LAB — FLOW CYTOMETRY

## 2023-01-22 LAB — SURGICAL PATHOLOGY

## 2023-01-26 ENCOUNTER — Encounter: Payer: Managed Care, Other (non HMO) | Admitting: Thoracic Surgery (Cardiothoracic Vascular Surgery)

## 2023-01-28 ENCOUNTER — Encounter: Payer: Managed Care, Other (non HMO) | Admitting: Thoracic Surgery (Cardiothoracic Vascular Surgery)

## 2023-01-29 ENCOUNTER — Encounter (HOSPITAL_COMMUNITY)
Admission: RE | Admit: 2023-01-29 | Discharge: 2023-01-29 | Disposition: A | Discharge status: Home / Self Care | Payer: Managed Care, Other (non HMO) | Source: Ambulatory Visit | Attending: Physician Assistant | Admitting: Physician Assistant

## 2023-01-29 DIAGNOSIS — R591 Generalized enlarged lymph nodes: Secondary | ICD-10-CM | POA: Diagnosis present

## 2023-01-29 DIAGNOSIS — E041 Nontoxic single thyroid nodule: Secondary | ICD-10-CM

## 2023-01-29 HISTORY — DX: Nontoxic single thyroid nodule: E04.1

## 2023-01-29 LAB — GLUCOSE, CAPILLARY: Glucose-Capillary: 104 mg/dL — ABNORMAL HIGH (ref 70–99)

## 2023-01-29 MED ORDER — FLUDEOXYGLUCOSE F - 18 (FDG) INJECTION
10.7300 | Freq: Once | INTRAVENOUS | Status: AC
  Administered 2023-01-29: 10.73 via INTRAVENOUS

## 2023-02-03 ENCOUNTER — Telehealth: Payer: Self-pay

## 2023-02-03 NOTE — Telephone Encounter (Signed)
Dr. Tonia Brooms requested nodule consult/review of PET results to be scheduled for this patient. Referring provider states she would inform patient to look for appt on mychart.  Appears patient has not used mychart recently.  Attempted to call for confirmed appt but no answer.  Left VM of appt date/arrival time and address.  Also left office number to call if further questions.

## 2023-02-09 ENCOUNTER — Encounter: Payer: Self-pay | Admitting: Pulmonary Disease

## 2023-02-09 ENCOUNTER — Ambulatory Visit (INDEPENDENT_AMBULATORY_CARE_PROVIDER_SITE_OTHER): Payer: Managed Care, Other (non HMO) | Admitting: Pulmonary Disease

## 2023-02-09 VITALS — BP 140/90 | HR 92 | Ht 65.0 in | Wt 212.0 lb

## 2023-02-09 DIAGNOSIS — R599 Enlarged lymph nodes, unspecified: Secondary | ICD-10-CM

## 2023-02-09 DIAGNOSIS — R942 Abnormal results of pulmonary function studies: Secondary | ICD-10-CM | POA: Diagnosis not present

## 2023-02-09 NOTE — Progress Notes (Signed)
Synopsis: Referred in June 2024 for lung nodule, adenopathy by Maud Deed, PA  Subjective:   PATIENT ID: Jackie Reed GENDER: female DOB: 08-02-1966, MRN: [id number]  Chief Complaint  Patient presents with   Consult    Consult for lung nodule abnormal PET scan    This is a 57 year old female, past medical history of migraines, hypertension, morbid obesity.  And lifelong non-smoker no history of malignancy, no family history of lung cancer.  No real significant family history of malignancy.  She has no family history of rheumatoid or other auto inflammatory diseases or family history of sarcoidosis.  She was having shortness of breath underwent CT imaging of the chest which showed bulky adenopathy.  Patient was taken for nuclear medicine PET imaging that was completed that revealed hypermetabolic uptake within the mediastinum.    Past Medical History:  Diagnosis Date   E-coli UTI 08/2019   Hepatitis B core antibody positive 05/2020   Hypertension    Migraine    Plantar fibromatosis    PONV (postoperative nausea and vomiting)    Vitamin B12 deficiency 06/2019   Vitamin D deficiency 06/2019     Family History  Problem Relation Age of Onset   Heart failure Mother    Diabetes Mother    Heart failure Father    Hypertension Father    Hyperlipidemia Father    Colon cancer Neg Hx    Colon polyps Neg Hx    Breast cancer Neg Hx    Crohn's disease Neg Hx    Esophageal cancer Neg Hx    Rectal cancer Neg Hx    Stomach cancer Neg Hx    Ulcerative colitis Neg Hx      Past Surgical History:  Procedure Laterality Date   ABDOMINAL HYSTERECTOMY     FOOT SURGERY  1985   MASS EXCISION Right 05/19/2018   Procedure: EXCISION PALMAR NODULES RIGHT HAND;  Surgeon: Betha Loa, MD;  Location: [facility];  Service: Orthopedics;  Laterality: Right;   PARTIAL HYSTERECTOMY  1997    Social History   Socioeconomic History   Marital status: Widowed    Spouse name: Not on  file   Number of children: Not on file   Years of education: Not on file   Highest education level: Not on file  Occupational History   Not on file  Tobacco Use   Smoking status: Never   Smokeless tobacco: Never  Vaping Use   Vaping Use: Never used  Substance and Sexual Activity   Alcohol use: No    Alcohol/week: 0.0 standard drinks of alcohol   Drug use: No   Sexual activity: Yes    Birth control/protection: None  Other Topics Concern   Not on file  Social History Narrative   Not on file   Social Determinants of Health   Financial Resource Strain: Not on file  Food Insecurity: Not on file  Transportation Needs: Not on file  Physical Activity: Not on file  Stress: Not on file  Social Connections: Not on file  Intimate Partner Violence: Not on file     Allergies  Allergen Reactions   Amoxicillin Shortness Of Breath   Morphine And Codeine Rash    At injection site   Tramadol Nausea And Vomiting     Outpatient Medications Prior to Visit  Medication Sig Dispense Refill   amLODipine (NORVASC) 5 MG tablet Take 2 tablets (10 mg total) by mouth daily. 90 tablet 3   ibuprofen (ADVIL)  800 MG tablet Take 1 tablet (800 mg total) by mouth 3 (three) times daily. 21 tablet 0   losartan (COZAAR) 25 MG tablet Take 1 tablet (25 mg total) by mouth daily. 90 tablet 3   promethazine-dextromethorphan (PROMETHAZINE-DM) 6.25-15 MG/5ML syrup Take 5 mLs by mouth 4 (four) times daily as needed for cough. 180 mL 0   No facility-administered medications prior to visit.    Review of Systems  Constitutional:  Negative for chills, fever, malaise/fatigue and weight loss.  HENT:  Negative for hearing loss, sore throat and tinnitus.   Eyes:  Negative for blurred vision and double vision.  Respiratory:  Positive for shortness of breath. Negative for cough, hemoptysis, sputum production, wheezing and stridor.   Cardiovascular:  Negative for chest pain, palpitations, orthopnea, leg swelling and  PND.  Gastrointestinal:  Negative for abdominal pain, constipation, diarrhea, heartburn, nausea and vomiting.  Genitourinary:  Negative for dysuria, hematuria and urgency.  Musculoskeletal:  Negative for joint pain and myalgias.  Skin:  Negative for itching and rash.  Neurological:  Negative for dizziness, tingling, weakness and headaches.  Endo/Heme/Allergies:  Negative for environmental allergies. Does not bruise/bleed easily.  Psychiatric/Behavioral:  Negative for depression. The patient is not nervous/anxious and does not have insomnia.   All other systems reviewed and are negative.    Objective:  Physical Exam Vitals reviewed.  Constitutional:      General: She is not in acute distress.    Appearance: She is well-developed. She is obese.  HENT:     Head: Normocephalic and atraumatic.  Eyes:     General: No scleral icterus.    Conjunctiva/sclera: Conjunctivae normal.     Pupils: Pupils are equal, round, and reactive to light.  Neck:     Vascular: No JVD.     Trachea: No tracheal deviation.  Cardiovascular:     Rate and Rhythm: Normal rate and regular rhythm.     Heart sounds: Normal heart sounds. No murmur heard. Pulmonary:     Effort: No tachypnea, accessory muscle usage or respiratory distress.     Breath sounds: Normal breath sounds. No stridor. No wheezing, rhonchi or rales.  Abdominal:     General: Bowel sounds are normal. There is no distension.     Palpations: Abdomen is soft.     Tenderness: There is no abdominal tenderness.  Musculoskeletal:        General: No tenderness.     Cervical back: Neck supple.  Lymphadenopathy:     Cervical: No cervical adenopathy.  Skin:    General: Skin is warm and dry.     Capillary Refill: Capillary refill takes less than 2 seconds.     Findings: No rash.  Neurological:     Mental Status: She is alert and oriented to person, place, and time.  Psychiatric:        Behavior: Behavior normal.      Vitals:   02/09/23 1504   BP: (!) 140/90  Pulse: 92  SpO2: 95%  Weight: 212 lb (96.2 kg)  Height: 5\' 5"  (1.651 m)   95% on RA BMI Readings from Last 3 Encounters:  02/09/23 35.28 kg/m  01/18/23 35.78 kg/m  09/14/22 35.61 kg/m   Wt Readings from Last 3 Encounters:  02/09/23 212 lb (96.2 kg)  01/18/23 215 lb (97.5 kg)  09/14/22 214 lb (97.1 kg)     CBC    Component Value Date/Time   WBC 6.2 01/20/2023 1424   WBC 5.3 01/18/2023 1030  RBC 5.51 (H) 01/20/2023 1424   HGB 12.4 01/20/2023 1424   HGB 12.2 05/27/2020 1211   HCT 39.2 01/20/2023 1424   HCT 40.2 05/27/2020 1211   PLT 438 (H) 01/20/2023 1424   PLT 294 05/27/2020 1211   MCV 71.1 (L) 01/20/2023 1424   MCV 74 (L) 05/27/2020 1211   MCH 22.5 (L) 01/20/2023 1424   MCHC 31.6 01/20/2023 1424   RDW 15.5 01/20/2023 1424   RDW 16.8 (H) 05/27/2020 1211   LYMPHSABS 0.9 01/20/2023 1424   LYMPHSABS 1.7 05/27/2020 1211   MONOABS 0.9 01/20/2023 1424   EOSABS 0.2 01/20/2023 1424   EOSABS 0.2 05/27/2020 1211   BASOSABS 0.0 01/20/2023 1424   BASOSABS 0.0 05/27/2020 1211     Chest Imaging: Nuclear medicine pet imaging June 2024: Hypermetabolic adenopathy within the mediastinum. The patient's images have been independently reviewed by me.    Pulmonary Functions Testing Results:     No data to display          FeNO:   Pathology:   Echocardiogram:   Heart Catheterization:     Assessment & Plan:     ICD-10-CM   1. Adenopathy  R59.9 Ambulatory referral to Pulmonology    Procedural/ Surgical Case Request: VIDEO BRONCHOSCOPY WITH ENDOBRONCHIAL ULTRASOUND    CANCELED: Procedural/ Surgical Case Request: VIDEO BRONCHOSCOPY WITH ENDOBRONCHIAL ULTRASOUND    CANCELED: Ambulatory referral to Pulmonology    2. Abnormal PET scan, lung  R94.2       Discussion:  This is a 57 year old female found to have abnormal pet imaging of the lung with adenopathy in the chest and mediastinum.  Plan: There was significant adenopathy seen on pet  imaging patient overall is asymptomatic.  Has not had fevers night sweats weight loss.  No history of malignancy and no risk factors for the development of malignancy. I think that her chance of this being cancer is still a possibility but I think we should entertain other diagnoses such as sarcoidosis as a potential underlying cause. We are going to move forward with videobronchoscopy with endobronchial ultrasound Tentative bronchoscopy date will be on 02/19/2023.  We appreciate PCC's help with scheduling   Current Outpatient Medications:    amLODipine (NORVASC) 5 MG tablet, Take 2 tablets (10 mg total) by mouth daily., Disp: 90 tablet, Rfl: 3   ibuprofen (ADVIL) 800 MG tablet, Take 1 tablet (800 mg total) by mouth 3 (three) times daily., Disp: 21 tablet, Rfl: 0   losartan (COZAAR) 25 MG tablet, Take 1 tablet (25 mg total) by mouth daily., Disp: 90 tablet, Rfl: 3   promethazine-dextromethorphan (PROMETHAZINE-DM) 6.25-15 MG/5ML syrup, Take 5 mLs by mouth 4 (four) times daily as needed for cough., Disp: 180 mL, Rfl: 0   Josephine Igo, DO [facility] 02/09/2023 4:44 PM

## 2023-02-09 NOTE — H&P (View-Only) (Signed)
 Synopsis: Referred in June 2024 for lung nodule, adenopathy by Howley, Desiree, PA  Subjective:   PATIENT ID: Jackie Reed GENDER: female DOB: 08/13/1966, MRN: [id number]  Chief Complaint  Patient presents with   Consult    Consult for lung nodule abnormal PET scan    This is a 56-year-old female, past medical history of migraines, hypertension, morbid obesity.  And lifelong non-smoker no history of malignancy, no family history of lung cancer.  No real significant family history of malignancy.  She has no family history of rheumatoid or other auto inflammatory diseases or family history of sarcoidosis.  She was having shortness of breath underwent CT imaging of the chest which showed bulky adenopathy.  Patient was taken for nuclear medicine PET imaging that was completed that revealed hypermetabolic uptake within the mediastinum.    Past Medical History:  Diagnosis Date   E-coli UTI 08/2019   Hepatitis B core antibody positive 05/2020   Hypertension    Migraine    Plantar fibromatosis    PONV (postoperative nausea and vomiting)    Vitamin B12 deficiency 06/2019   Vitamin D deficiency 06/2019     Family History  Problem Relation Age of Onset   Heart failure Mother    Diabetes Mother    Heart failure Father    Hypertension Father    Hyperlipidemia Father    Colon cancer Neg Hx    Colon polyps Neg Hx    Breast cancer Neg Hx    Crohn's disease Neg Hx    Esophageal cancer Neg Hx    Rectal cancer Neg Hx    Stomach cancer Neg Hx    Ulcerative colitis Neg Hx      Past Surgical History:  Procedure Laterality Date   ABDOMINAL HYSTERECTOMY     FOOT SURGERY  1985   MASS EXCISION Right 05/19/2018   Procedure: EXCISION PALMAR NODULES RIGHT HAND;  Surgeon: Kuzma, Kevin, MD;  Location: [facility];  Service: Orthopedics;  Laterality: Right;   PARTIAL HYSTERECTOMY  1997    Social History   Socioeconomic History   Marital status: Widowed    Spouse name: Not on  file   Number of children: Not on file   Years of education: Not on file   Highest education level: Not on file  Occupational History   Not on file  Tobacco Use   Smoking status: Never   Smokeless tobacco: Never  Vaping Use   Vaping Use: Never used  Substance and Sexual Activity   Alcohol use: No    Alcohol/week: 0.0 standard drinks of alcohol   Drug use: No   Sexual activity: Yes    Birth control/protection: None  Other Topics Concern   Not on file  Social History Narrative   Not on file   Social Determinants of Health   Financial Resource Strain: Not on file  Food Insecurity: Not on file  Transportation Needs: Not on file  Physical Activity: Not on file  Stress: Not on file  Social Connections: Not on file  Intimate Partner Violence: Not on file     Allergies  Allergen Reactions   Amoxicillin Shortness Of Breath   Morphine And Codeine Rash    At injection site   Tramadol Nausea And Vomiting     Outpatient Medications Prior to Visit  Medication Sig Dispense Refill   amLODipine (NORVASC) 5 MG tablet Take 2 tablets (10 mg total) by mouth daily. 90 tablet 3   ibuprofen (ADVIL)   800 MG tablet Take 1 tablet (800 mg total) by mouth 3 (three) times daily. 21 tablet 0   losartan (COZAAR) 25 MG tablet Take 1 tablet (25 mg total) by mouth daily. 90 tablet 3   promethazine-dextromethorphan (PROMETHAZINE-DM) 6.25-15 MG/5ML syrup Take 5 mLs by mouth 4 (four) times daily as needed for cough. 180 mL 0   No facility-administered medications prior to visit.    Review of Systems  Constitutional:  Negative for chills, fever, malaise/fatigue and weight loss.  HENT:  Negative for hearing loss, sore throat and tinnitus.   Eyes:  Negative for blurred vision and double vision.  Respiratory:  Positive for shortness of breath. Negative for cough, hemoptysis, sputum production, wheezing and stridor.   Cardiovascular:  Negative for chest pain, palpitations, orthopnea, leg swelling and  PND.  Gastrointestinal:  Negative for abdominal pain, constipation, diarrhea, heartburn, nausea and vomiting.  Genitourinary:  Negative for dysuria, hematuria and urgency.  Musculoskeletal:  Negative for joint pain and myalgias.  Skin:  Negative for itching and rash.  Neurological:  Negative for dizziness, tingling, weakness and headaches.  Endo/Heme/Allergies:  Negative for environmental allergies. Does not bruise/bleed easily.  Psychiatric/Behavioral:  Negative for depression. The patient is not nervous/anxious and does not have insomnia.   All other systems reviewed and are negative.    Objective:  Physical Exam Vitals reviewed.  Constitutional:      General: She is not in acute distress.    Appearance: She is well-developed. She is obese.  HENT:     Head: Normocephalic and atraumatic.  Eyes:     General: No scleral icterus.    Conjunctiva/sclera: Conjunctivae normal.     Pupils: Pupils are equal, round, and reactive to light.  Neck:     Vascular: No JVD.     Trachea: No tracheal deviation.  Cardiovascular:     Rate and Rhythm: Normal rate and regular rhythm.     Heart sounds: Normal heart sounds. No murmur heard. Pulmonary:     Effort: No tachypnea, accessory muscle usage or respiratory distress.     Breath sounds: Normal breath sounds. No stridor. No wheezing, rhonchi or rales.  Abdominal:     General: Bowel sounds are normal. There is no distension.     Palpations: Abdomen is soft.     Tenderness: There is no abdominal tenderness.  Musculoskeletal:        General: No tenderness.     Cervical back: Neck supple.  Lymphadenopathy:     Cervical: No cervical adenopathy.  Skin:    General: Skin is warm and dry.     Capillary Refill: Capillary refill takes less than 2 seconds.     Findings: No rash.  Neurological:     Mental Status: She is alert and oriented to person, place, and time.  Psychiatric:        Behavior: Behavior normal.      Vitals:   02/09/23 1504   BP: (!) 140/90  Pulse: 92  SpO2: 95%  Weight: 212 lb (96.2 kg)  Height: 5' 5" (1.651 m)   95% on RA BMI Readings from Last 3 Encounters:  02/09/23 35.28 kg/m  01/18/23 35.78 kg/m  09/14/22 35.61 kg/m   Wt Readings from Last 3 Encounters:  02/09/23 212 lb (96.2 kg)  01/18/23 215 lb (97.5 kg)  09/14/22 214 lb (97.1 kg)     CBC    Component Value Date/Time   WBC 6.2 01/20/2023 1424   WBC 5.3 01/18/2023 1030     RBC 5.51 (H) 01/20/2023 1424   HGB 12.4 01/20/2023 1424   HGB 12.2 05/27/2020 1211   HCT 39.2 01/20/2023 1424   HCT 40.2 05/27/2020 1211   PLT 438 (H) 01/20/2023 1424   PLT 294 05/27/2020 1211   MCV 71.1 (L) 01/20/2023 1424   MCV 74 (L) 05/27/2020 1211   MCH 22.5 (L) 01/20/2023 1424   MCHC 31.6 01/20/2023 1424   RDW 15.5 01/20/2023 1424   RDW 16.8 (H) 05/27/2020 1211   LYMPHSABS 0.9 01/20/2023 1424   LYMPHSABS 1.7 05/27/2020 1211   MONOABS 0.9 01/20/2023 1424   EOSABS 0.2 01/20/2023 1424   EOSABS 0.2 05/27/2020 1211   BASOSABS 0.0 01/20/2023 1424   BASOSABS 0.0 05/27/2020 1211     Chest Imaging: Nuclear medicine pet imaging June 2024: Hypermetabolic adenopathy within the mediastinum. The patient's images have been independently reviewed by me.    Pulmonary Functions Testing Results:     No data to display          FeNO:   Pathology:   Echocardiogram:   Heart Catheterization:     Assessment & Plan:     ICD-10-CM   1. Adenopathy  R59.9 Ambulatory referral to Pulmonology    Procedural/ Surgical Case Request: VIDEO BRONCHOSCOPY WITH ENDOBRONCHIAL ULTRASOUND    CANCELED: Procedural/ Surgical Case Request: VIDEO BRONCHOSCOPY WITH ENDOBRONCHIAL ULTRASOUND    CANCELED: Ambulatory referral to Pulmonology    2. Abnormal PET scan, lung  R94.2       Discussion:  This is a 56-year-old female found to have abnormal pet imaging of the lung with adenopathy in the chest and mediastinum.  Plan: There was significant adenopathy seen on pet  imaging patient overall is asymptomatic.  Has not had fevers night sweats weight loss.  No history of malignancy and no risk factors for the development of malignancy. I think that her chance of this being cancer is still a possibility but I think we should entertain other diagnoses such as sarcoidosis as a potential underlying cause. We are going to move forward with videobronchoscopy with endobronchial ultrasound Tentative bronchoscopy date will be on 02/19/2023.  We appreciate PCC's help with scheduling   Current Outpatient Medications:    amLODipine (NORVASC) 5 MG tablet, Take 2 tablets (10 mg total) by mouth daily., Disp: 90 tablet, Rfl: 3   ibuprofen (ADVIL) 800 MG tablet, Take 1 tablet (800 mg total) by mouth 3 (three) times daily., Disp: 21 tablet, Rfl: 0   losartan (COZAAR) 25 MG tablet, Take 1 tablet (25 mg total) by mouth daily., Disp: 90 tablet, Rfl: 3   promethazine-dextromethorphan (PROMETHAZINE-DM) 6.25-15 MG/5ML syrup, Take 5 mLs by mouth 4 (four) times daily as needed for cough., Disp: 180 mL, Rfl: 0   Salah Nakamura L Shilee Biggs, DO [facility] 02/09/2023 4:44 PM    

## 2023-02-09 NOTE — Patient Instructions (Addendum)
Thank you for visiting Dr. Tonia Brooms at [facility]. Today we recommend the following:  Orders Placed This Encounter  Procedures   Procedural/ Surgical Case Request: VIDEO BRONCHOSCOPY WITH ENDOBRONCHIAL ULTRASOUND   Ambulatory referral to Pulmonology    Return for after Bronchoscopy, with Kandice Robinsons, NP. 1 week     Please do your part to reduce the spread of COVID-19.

## 2023-02-10 ENCOUNTER — Other Ambulatory Visit: Payer: Self-pay | Admitting: Physician Assistant

## 2023-02-10 DIAGNOSIS — E041 Nontoxic single thyroid nodule: Secondary | ICD-10-CM

## 2023-02-10 NOTE — Progress Notes (Signed)
Ultrasound of thyroid ordered.  On recent PET scan 01/29/2023 it was noted hypermetabolic right thyroid nodule. Will consider biopsy based on [place] results.

## 2023-02-11 ENCOUNTER — Encounter: Payer: Managed Care, Other (non HMO) | Admitting: Thoracic Surgery (Cardiothoracic Vascular Surgery)

## 2023-02-12 ENCOUNTER — Telehealth: Payer: Self-pay

## 2023-02-12 NOTE — Telephone Encounter (Signed)
This RN called and spoke with patient per request of Namon Cirri, PA-C regarding ultrasound ordered for the thyroid nodule seen on her recent PET scan. Patient was given the number for scheduling and verbalized understanding that she can schedule the appointment after her pulmonology appointment. Patient has no questions at this time.

## 2023-02-17 ENCOUNTER — Other Ambulatory Visit: Payer: Self-pay

## 2023-02-17 ENCOUNTER — Encounter (HOSPITAL_COMMUNITY): Payer: Self-pay | Admitting: Pulmonary Disease

## 2023-02-17 NOTE — Progress Notes (Signed)
PCP - Maud Deed, PA Cardiologist - none  Chest x-ray - 12/29/22 EKG - 01/18/23 Stress Test - 03/27/10 ECHO - n/a Cardiac Cath - n/a  ICD Pacemaker/Loop - n/a  Sleep Study -  n/a CPAP - none  Diabetes Type - n/a  NPO  Anesthesia review: Yes  STOP now taking any Aspirin (unless otherwise instructed by your surgeon), Aleve, Naproxen, Ibuprofen, Motrin, Advil, Goody's, BC's, all herbal medications, fish oil, and all vitamins.   Coronavirus Screening Do you have any of the following symptoms:  Cough yes/no: No Fever (>100.35F)  yes/no: No Runny nose yes/no: No Sore throat yes/no: No Difficulty breathing/shortness of breath  occasional  Have you traveled in the last 14 days and where? yes/no: No  Patient verbalized understanding of instructions that were given via phone.

## 2023-02-18 NOTE — Progress Notes (Signed)
Anesthesia Chart Review: SAME DAY WORK-UP   Case: [id number] Date/Time: 02/19/23 1200   Procedure: VIDEO BRONCHOSCOPY WITH ENDOBRONCHIAL ULTRASOUND (Bilateral)   Anesthesia type: General   Diagnosis: Adenopathy [R59.9]   Pre-op diagnosis: adenopathy   [facility] / [facility]   Surgeons: Josephine Igo, DO       DISCUSSION: Patient is a 57 year old female scheduled for the above procedure. Seen in ED on 01/18/23 for headache, dyspnea, leg swelling with treatment for URI 12/30/22. CXR suggestive of RLL PNA, but given fullness of perihilar region a CTA was ordered showing right pulmonary nodules and right hilar and mediastinal lymphadenopathy. Brain MRI did not show any acute findings  She has subsequently had a PET scan and saw pulmonology and hematology with the above procedure recommended.    Other history includes never smoker, postoperative N/V, HTN, prediabetes, dyspnea, Hepatitis B (seen by ID Dr. Earlene Plater 07/03/20).   Anesthesia team to evaluate on the day of surgery.  VS: Ht 5\' 4"  (1.626 m)   Wt 96.6 kg   BMI 36.56 kg/m  BP Readings from Last 3 Encounters:  02/09/23 (!) 140/90  01/20/23 (!) 186/100  01/18/23 (!) 154/85   Pulse Readings from Last 3 Encounters:  02/09/23 92  01/20/23 (!) 106  01/18/23 86     PROVIDERS: Maud Deed, PA is PCP  Yancey Flemings, MD is GI Namon Cirri, PA-C is HEM-ONC provider Audie Box, DO is pulmonologist   LABS: Most recent lab results in [facility] include: Lab Results  Component Value Date   WBC 6.2 01/20/2023   HGB 12.4 01/20/2023   HCT 39.2 01/20/2023   PLT 438 (H) 01/20/2023   GLUCOSE 105 (H) 01/20/2023   ALT 18 01/20/2023   AST 35 01/20/2023   NA 138 01/20/2023   K 3.6 01/20/2023   CL 105 01/20/2023   CREATININE 0.64 01/20/2023   BUN 9 01/20/2023   CO2 25 01/20/2023    IMAGES: PET Scan 01/29/23: IMPRESSION: 1. Hypermetabolic central right lower lobe mass with multiple hypermetabolic  mediastinal, bilateral hilar and right supraclavicular lymph nodes. Findings are most consistent with bronchogenic carcinoma with nodal metastases. 2. Two focal areas of hypermetabolic activity in the subpleural aspect of the right lower lobe are favored to be pulmonary or pleural based. No well-defined pulmonary nodules are identified. 3. Small hypermetabolic lymph nodes in the upper abdomen, suspicious for nodal metastases. No other evidence of metastatic disease in the abdomen or pelvis. No suspicious osseous findings. 4. Hypermetabolic right thyroid nodule. Hypermetabolic thyroid nodules on PET have up to 50% incidence of malignancy. Recommend thyroid US and biopsy.(Ref: J Am Coll Radiol. 2015 Feb;12(2): 143-50). 5.  Aortic Atherosclerosis (ICD10-I70.0). - Oncology ordered a thyroid US and will consider biopsy based on results.    MRI Brain 01/18/23: IMPRESSION: 1. No acute intracranial abnormality or evidence of intracranial metastases. 2. Mild cerebral white matter T2 signal changes, slightly progressed from 2017 and nonspecific though may reflect chronic small vessel ischemic disease or migraines.  CTA Chest 01/18/23: IMPRESSION: 1. Negative examination for pulmonary embolism. 2. Bulky right hilar and mediastinal lymphadenopathy, largest subcarinal node 3.6 x 2.4 cm. This is highly concerning for malignancy and nodal metastatic disease. 3. Interlobular septal thickening about the right lung base, which may reflect vascular congestion in the setting of hilar mass/lymphadenopathy but is worrisome for lymphangitic metastatic disease. 4. Multiple small pulmonary nodules in the right lower lobe measuring up to 0.4 cm, nonspecific although very worrisome for intrapulmonary  metastases given distribution in the right lower lobe. - Aortic Atherosclerosis (ICD10-I70.0).   EKG: 01/18/23:  Sinus rhythm Left ventricular hypertrophy Anterior Q waves, possibly due to LVH No  significant change since last tracing Confirmed by Alvira Monday ([id number]) on 01/18/2023 9:49:24 AM   CV: Nuclear stress test 03/27/10: IMPRESSION:  Normal examination without evidence of exercise induced myocardial  ischemia.  The calculated left ventricular ejection fraction is  72%.   Past Medical History:  Diagnosis Date   Dyspnea    occasional   E-coli UTI 08/2019   Hepatitis 05/2020   Hepatitis B core antibody positive   Hypertension    Migraine    Plantar fibromatosis    PONV (postoperative nausea and vomiting)    Pre-diabetes    Thyroid nodule 01/29/2023   seen on PET scan   Vitamin B12 deficiency 06/2019   Vitamin D deficiency 06/2019    Past Surgical History:  Procedure Laterality Date   ABDOMINAL HYSTERECTOMY     FOOT SURGERY Bilateral 1985   MASS EXCISION Right 05/19/2018   Procedure: EXCISION PALMAR NODULES RIGHT HAND;  Surgeon: Betha Loa, MD;  Location: [facility];  Service: Orthopedics;  Laterality: Right;   PARTIAL HYSTERECTOMY  1997    MEDICATIONS: No current facility-administered medications for this encounter.    amLODipine (NORVASC) 5 MG tablet   ibuprofen (ADVIL) 800 MG tablet   losartan (COZAAR) 25 MG tablet   promethazine-dextromethorphan (PROMETHAZINE-DM) 6.25-15 MG/5ML syrup    Shonna Chock, PA-C Surgical Short Stay/Anesthesiology [facility] Phone [phone] [facility] Phone [phone] 02/18/2023 3:52 PM

## 2023-02-18 NOTE — Anesthesia Preprocedure Evaluation (Addendum)
Anesthesia Evaluation  Patient identified by MRN, date of birth, ID band Patient awake    Reviewed: Allergy & Precautions, NPO status , Patient's Chart, lab work & pertinent test results  History of Anesthesia Complications (+) PONV and history of anesthetic complications  Airway Mallampati: III  TM Distance: >3 FB Neck ROM: Full    Dental no notable dental hx. (+) Missing, Poor Dentition, Dental Advisory Given, Chipped,    Pulmonary neg pulmonary ROS   Pulmonary exam normal breath sounds clear to auscultation       Cardiovascular hypertension (167/101 preop, per pt normally 150-160/90), Pt. on medications Normal cardiovascular exam Rhythm:Regular Rate:Normal  Ran out of amlodipine so hasn't taken it in one week   Neuro/Psych  Headaches  negative psych ROS   GI/Hepatic negative GI ROS, Neg liver ROS,,,  Endo/Other  FS 112 Obesity BMI 37  Renal/GU negative Renal ROS  negative genitourinary   Musculoskeletal negative musculoskeletal ROS (+)    Abdominal  (+) + obese  Peds  Hematology negative hematology ROS (+)   Anesthesia Other Findings   Reproductive/Obstetrics negative OB ROS                             Anesthesia Physical Anesthesia Plan  ASA: 3  Anesthesia Plan: General   Post-op Pain Management: Tylenol PO (pre-op)*   Induction: Intravenous  PONV Risk Score and Plan: 4 or greater and Ondansetron, Dexamethasone, Midazolam and Treatment may vary due to age or medical condition  Airway Management Planned: Oral ETT  Additional Equipment: None  Intra-op Plan:   Post-operative Plan: Extubation in OR  Informed Consent: I have reviewed the patients History and Physical, chart, labs and discussed the procedure including the risks, benefits and alternatives for the proposed anesthesia with the patient or authorized representative who has indicated his/her understanding and  acceptance.     Dental advisory given  Plan Discussed with: CRNA  Anesthesia Plan Comments: (PAT note written 02/18/2023 by Shonna Chock, PA-C.  )       Anesthesia Quick Evaluation

## 2023-02-19 ENCOUNTER — Encounter (HOSPITAL_COMMUNITY): Payer: Self-pay | Admitting: Pulmonary Disease

## 2023-02-19 ENCOUNTER — Ambulatory Visit (HOSPITAL_COMMUNITY): Payer: Managed Care, Other (non HMO) | Admitting: Anesthesiology

## 2023-02-19 ENCOUNTER — Other Ambulatory Visit: Payer: Self-pay

## 2023-02-19 ENCOUNTER — Encounter (HOSPITAL_COMMUNITY)
Admission: RE | Disposition: A | Discharge status: Home / Self Care | Payer: Self-pay | Source: Home / Self Care | Attending: Pulmonary Disease

## 2023-02-19 ENCOUNTER — Ambulatory Visit (HOSPITAL_COMMUNITY)
Admission: RE | Admit: 2023-02-19 | Discharge: 2023-02-19 | Disposition: A | Discharge status: Home / Self Care | Payer: Managed Care, Other (non HMO) | Attending: Pulmonary Disease | Admitting: Pulmonary Disease

## 2023-02-19 ENCOUNTER — Ambulatory Visit (HOSPITAL_BASED_OUTPATIENT_CLINIC_OR_DEPARTMENT_OTHER): Payer: Managed Care, Other (non HMO) | Admitting: Anesthesiology

## 2023-02-19 DIAGNOSIS — R942 Abnormal results of pulmonary function studies: Secondary | ICD-10-CM | POA: Insufficient documentation

## 2023-02-19 DIAGNOSIS — I10 Essential (primary) hypertension: Secondary | ICD-10-CM

## 2023-02-19 DIAGNOSIS — R590 Localized enlarged lymph nodes: Secondary | ICD-10-CM | POA: Diagnosis not present

## 2023-02-19 DIAGNOSIS — R599 Enlarged lymph nodes, unspecified: Secondary | ICD-10-CM | POA: Diagnosis present

## 2023-02-19 DIAGNOSIS — Z6835 Body mass index (BMI) 35.0-35.9, adult: Secondary | ICD-10-CM

## 2023-02-19 HISTORY — DX: Dyspnea, unspecified: R06.00

## 2023-02-19 HISTORY — PX: VIDEO BRONCHOSCOPY WITH ENDOBRONCHIAL ULTRASOUND: SHX6177

## 2023-02-19 HISTORY — DX: Prediabetes: R73.03

## 2023-02-19 HISTORY — PX: FINE NEEDLE ASPIRATION: SHX5430

## 2023-02-19 LAB — GLUCOSE, CAPILLARY: Glucose-Capillary: 112 mg/dL — ABNORMAL HIGH (ref 70–99)

## 2023-02-19 SURGERY — BRONCHOSCOPY, WITH EBUS
Anesthesia: General | Laterality: Bilateral

## 2023-02-19 MED ORDER — PROPOFOL 500 MG/50ML IV EMUL
INTRAVENOUS | Status: DC | PRN
  Administered 2023-02-19: 150 ug/kg/min via INTRAVENOUS

## 2023-02-19 MED ORDER — PROPOFOL 10 MG/ML IV BOLUS
INTRAVENOUS | Status: DC | PRN
  Administered 2023-02-19: 20 mg via INTRAVENOUS
  Administered 2023-02-19: 200 mg via INTRAVENOUS

## 2023-02-19 MED ORDER — FENTANYL CITRATE (PF) 250 MCG/5ML IJ SOLN
INTRAMUSCULAR | Status: DC | PRN
  Administered 2023-02-19 (×2): 50 ug via INTRAVENOUS

## 2023-02-19 MED ORDER — PHENYLEPHRINE HCL-NACL 20-0.9 MG/250ML-% IV SOLN
INTRAVENOUS | Status: DC | PRN
  Administered 2023-02-19: 25 ug/min via INTRAVENOUS

## 2023-02-19 MED ORDER — ONDANSETRON HCL 4 MG/2ML IJ SOLN
INTRAMUSCULAR | Status: DC | PRN
  Administered 2023-02-19: 4 mg via INTRAVENOUS

## 2023-02-19 MED ORDER — MIDAZOLAM HCL 5 MG/5ML IJ SOLN
INTRAMUSCULAR | Status: DC | PRN
  Administered 2023-02-19: 2 mg via INTRAVENOUS

## 2023-02-19 MED ORDER — FENTANYL CITRATE (PF) 100 MCG/2ML IJ SOLN: 25.0000 ug | INTRAMUSCULAR | Status: DC | PRN

## 2023-02-19 MED ORDER — LACTATED RINGERS IV SOLN: INTRAVENOUS | Status: DC

## 2023-02-19 MED ORDER — CHLORHEXIDINE GLUCONATE 0.12 % MT SOLN: 15.0000 mL | Freq: Once | OROMUCOSAL | Status: AC

## 2023-02-19 MED ORDER — SUGAMMADEX SODIUM 200 MG/2ML IV SOLN
INTRAVENOUS | Status: DC | PRN
  Administered 2023-02-19: 400 mg via INTRAVENOUS

## 2023-02-19 MED ORDER — ONDANSETRON HCL 4 MG/2ML IJ SOLN: 4.0000 mg | Freq: Once | INTRAMUSCULAR | Status: DC | PRN

## 2023-02-19 MED ORDER — AMISULPRIDE (ANTIEMETIC) 5 MG/2ML IV SOLN
10.0000 mg | Freq: Once | INTRAVENOUS | Status: AC
  Administered 2023-02-19: 10 mg via INTRAVENOUS

## 2023-02-19 MED ORDER — CHLORHEXIDINE GLUCONATE 0.12 % MT SOLN
OROMUCOSAL | Status: AC
  Administered 2023-02-19: 15 mL
  Filled 2023-02-19: qty 15

## 2023-02-19 MED ORDER — AMISULPRIDE (ANTIEMETIC) 5 MG/2ML IV SOLN
INTRAVENOUS | Status: AC
  Filled 2023-02-19: qty 4

## 2023-02-19 MED ORDER — DEXAMETHASONE SODIUM PHOSPHATE 10 MG/ML IJ SOLN
INTRAMUSCULAR | Status: DC | PRN
  Administered 2023-02-19: 4 mg via INTRAVENOUS

## 2023-02-19 MED ORDER — AMISULPRIDE (ANTIEMETIC) 5 MG/2ML IV SOLN: 10.0000 mg | Freq: Once | INTRAVENOUS | Status: DC | PRN

## 2023-02-19 MED ORDER — ROCURONIUM BROMIDE 10 MG/ML (PF) SYRINGE
PREFILLED_SYRINGE | INTRAVENOUS | Status: DC | PRN
  Administered 2023-02-19: 60 mg via INTRAVENOUS

## 2023-02-19 MED ORDER — LIDOCAINE 2% (20 MG/ML) 5 ML SYRINGE
INTRAMUSCULAR | Status: DC | PRN
  Administered 2023-02-19: 60 mg via INTRAVENOUS

## 2023-02-19 SURGICAL SUPPLY — 29 items
BRUSH CYTOL CELLEBRITY 1.5X140 (MISCELLANEOUS) IMPLANT
CANISTER SUCT 3000ML PPV (MISCELLANEOUS) ×3 IMPLANT
CONT SPEC 4OZ CLIKSEAL STRL BL (MISCELLANEOUS) ×3 IMPLANT
COVER BACK TABLE 60X90IN (DRAPES) ×3 IMPLANT
COVER DOME SNAP 22 D (MISCELLANEOUS) ×3 IMPLANT
FORCEPS BIOP RJ4 1.8 (CUTTING FORCEPS) IMPLANT
GAUZE SPONGE 4X4 12PLY STRL (GAUZE/BANDAGES/DRESSINGS) ×3 IMPLANT
GLOVE BIO SURGEON STRL SZ7.5 (GLOVE) ×3 IMPLANT
GOWN STRL REUS W/ TWL LRG LVL3 (GOWN DISPOSABLE) ×3 IMPLANT
GOWN STRL REUS W/TWL LRG LVL3 (GOWN DISPOSABLE) ×2
KIT CLEAN ENDO COMPLIANCE (KITS) ×6 IMPLANT
KIT TURNOVER KIT B (KITS) ×3 IMPLANT
MARKER SKIN DUAL TIP RULER LAB (MISCELLANEOUS) ×3 IMPLANT
NDL EBUS SONO TIP PENTAX (NEEDLE) ×3 IMPLANT
NEEDLE EBUS SONO TIP PENTAX (NEEDLE) ×2 IMPLANT
NS IRRIG 1000ML POUR BTL (IV SOLUTION) ×3 IMPLANT
OIL SILICONE PENTAX (PARTS (SERVICE/REPAIRS)) ×3 IMPLANT
PAD ARMBOARD 7.5X6 YLW CONV (MISCELLANEOUS) ×6 IMPLANT
SOL ANTI FOG 6CC (MISCELLANEOUS) ×3 IMPLANT
SYR 20CC LL (SYRINGE) ×6 IMPLANT
SYR 20ML ECCENTRIC (SYRINGE) ×6 IMPLANT
SYR 50ML SLIP (SYRINGE) IMPLANT
SYR 5ML LUER SLIP (SYRINGE) ×3 IMPLANT
TOWEL OR 17X24 6PK STRL BLUE (TOWEL DISPOSABLE) ×3 IMPLANT
TRAP SPECIMEN MUCOUS 40CC (MISCELLANEOUS) IMPLANT
TUBE CONNECTING 20X1/4 (TUBING) ×6 IMPLANT
UNDERPAD 30X30 (UNDERPADS AND DIAPERS) ×3 IMPLANT
VALVE DISPOSABLE (MISCELLANEOUS) ×3 IMPLANT
WATER STERILE IRR 1000ML POUR (IV SOLUTION) ×3 IMPLANT

## 2023-02-19 NOTE — Transfer of Care (Signed)
Immediate Anesthesia Transfer of Care Note  Patient: Jackie Reed  Procedure(s) Performed: VIDEO BRONCHOSCOPY WITH ENDOBRONCHIAL ULTRASOUND (Bilateral) FINE NEEDLE ASPIRATION (FNA) LINEAR  Patient Location: PACU  Anesthesia Type:General  Level of Consciousness: drowsy  Airway & Oxygen Therapy: Patient Spontanous Breathing and Patient connected to face mask oxygen  Post-op Assessment: Report given to RN and Post -op Vital signs reviewed and stable  Post vital signs: Reviewed and stable  Last Vitals:  Vitals Value Taken Time  BP 91/59 02/19/23 1253  Temp 36.6 C 02/19/23 1253  Pulse 90 02/19/23 1258  Resp 20 02/19/23 1258  SpO2 91 % 02/19/23 1258  Vitals shown include unvalidated device data.  Last Pain:  Vitals:   02/19/23 1253  TempSrc:   PainSc: Asleep         Complications: No notable events documented.

## 2023-02-19 NOTE — Anesthesia Procedure Notes (Signed)
Procedure Name: Intubation Date/Time: 02/19/2023 11:58 AM  Performed by: Waynard Edwards, CRNAPre-anesthesia Checklist: Patient identified, Emergency Drugs available, Suction available, Patient being monitored and Timeout performed Patient Re-evaluated:Patient Re-evaluated prior to induction Oxygen Delivery Method: Circle system utilized Preoxygenation: Pre-oxygenation with 100% oxygen Induction Type: IV induction Ventilation: Mask ventilation without difficulty Laryngoscope Size: Mac and 3 Grade View: Grade III Tube type: Oral Tube size: 8.5 mm Number of attempts: 1 Airway Equipment and Method: Stylet Placement Confirmation: ETT inserted through vocal cords under direct vision, positive ETCO2, breath sounds checked- equal and bilateral and CO2 detector Secured at: 21 cm Tube secured with: Tape Dental Injury: Teeth and Oropharynx as per pre-operative assessment

## 2023-02-19 NOTE — Op Note (Signed)
Video Bronchoscopy with Endobronchial Ultrasound Procedure Note  Date of Operation: 02/19/2023  Pre-op Diagnosis: Adenopathy   Post-op Diagnosis: Adenopathy   Surgeon: Josephine Igo, DO   Assistants: None   Anesthesia: General endotracheal anesthesia  Operation: Flexible video fiberoptic bronchoscopy with endobronchial ultrasound and biopsies.  Estimated Blood Loss: Minimal  Complications: None   Indications and History: Jackie Reed is a 57 y.o. female with mediastinal adenopathy.  The risks, benefits, complications, treatment options and expected outcomes were discussed with the patient.  The possibilities of pneumothorax, pneumonia, reaction to medication, pulmonary aspiration, perforation of a viscus, bleeding, failure to diagnose a condition and creating a complication requiring transfusion or operation were discussed with the patient who freely signed the consent.    Description of Procedure: The patient was examined in the preoperative area and history and data from the preprocedure consultation were reviewed. It was deemed appropriate to proceed.  The patient was taken to [facility] Endo Room 3, identified as Jackie Reed and the procedure verified as Flexible Video Fiberoptic Bronchoscopy.  A Time Out was held and the above information confirmed. After being taken to the operating room general anesthesia was initiated and the patient  was orally intubated. The video fiberoptic bronchoscope was introduced via the endotracheal tube and a general inspection was performed which showed irregular mucosa along the right mainstem, there was also enlarged adenopathy seen under ultrasound compared to the patient's pet imaging.. The standard scope was then withdrawn and the endobronchial ultrasound was used to identify and characterize the peritracheal, hilar and bronchial lymph nodes. Inspection showed enlarged subcarinal hilar and paratracheal adenopathy. Using real-time ultrasound guidance Wang  needle biopsies were take from Station 7 nodes and were sent for cytology. The patient tolerated the procedure well without apparent complications. There was no significant blood loss. The bronchoscope was withdrawn. Anesthesia was reversed and the patient was taken to the PACU for recovery.   Samples: 1. Wang needle biopsies from station 7 node  Plans:  The patient will be discharged from the PACU to home when recovered from anesthesia. We will review the cytology, pathology results with the patient when they become available. Outpatient followup will be with Kandice Robinsons, NP.    Josephine Igo, DO [facility] 02/19/2023 1:01 PM

## 2023-02-19 NOTE — Discharge Instructions (Signed)

## 2023-02-19 NOTE — Anesthesia Postprocedure Evaluation (Signed)
Anesthesia Post Note  Patient: Jackie Reed  Procedure(s) Performed: VIDEO BRONCHOSCOPY WITH ENDOBRONCHIAL ULTRASOUND (Bilateral) FINE NEEDLE ASPIRATION (FNA) LINEAR     Patient location during evaluation: PACU Anesthesia Type: General Level of consciousness: awake and alert, oriented and patient cooperative Pain management: pain level controlled Vital Signs Assessment: post-procedure vital signs reviewed and stable Respiratory status: spontaneous breathing, nonlabored ventilation and respiratory function stable Cardiovascular status: blood pressure returned to baseline and stable Postop Assessment: no apparent nausea or vomiting Anesthetic complications: no   No notable events documented.  Last Vitals:  Vitals:   02/19/23 1330 02/19/23 1345  BP: (!) 152/91 (!) 153/80  Pulse: 67 68  Resp: 14 11  Temp:  36.6 C  SpO2: 98% 97%    Last Pain:  Vitals:   02/19/23 1345  TempSrc:   PainSc: 0-No pain                 Lannie Fields

## 2023-02-19 NOTE — Interval H&P Note (Signed)
History and Physical Interval Note:  02/19/2023 10:46 AM  Jackie Reed  has presented today for surgery, with the diagnosis of adenopathy.  The various methods of treatment have been discussed with the patient and family. After consideration of risks, benefits and other options for treatment, the patient has consented to  Procedure(s): VIDEO BRONCHOSCOPY WITH ENDOBRONCHIAL ULTRASOUND (Bilateral) as a surgical intervention.  The patient's history has been reviewed, patient examined, no change in status, stable for surgery.  I have reviewed the patient's chart and labs.  Questions were answered to the patient's satisfaction.     Rachel Bo Chai Routh

## 2023-02-22 ENCOUNTER — Encounter (HOSPITAL_COMMUNITY): Payer: Self-pay | Admitting: Pulmonary Disease

## 2023-02-22 LAB — SURGICAL PATHOLOGY

## 2023-02-23 ENCOUNTER — Encounter: Payer: Managed Care, Other (non HMO) | Admitting: Thoracic Surgery (Cardiothoracic Vascular Surgery)

## 2023-02-23 LAB — CYTOLOGY - NON PAP

## 2023-02-24 ENCOUNTER — Ambulatory Visit (HOSPITAL_COMMUNITY): Admission: RE | Admit: 2023-02-24 | Payer: Managed Care, Other (non HMO) | Source: Ambulatory Visit

## 2023-02-26 ENCOUNTER — Telehealth: Payer: Self-pay | Admitting: Physician Assistant

## 2023-02-26 NOTE — Telephone Encounter (Signed)
I called [name] to review the lung biopsy and Station 7 lymph node biopsy results from 02/19/2023. Findings are consistent with granulomatous inflammation and necrosis, most consistent with sarcoidosis. No evidence of malignancy identified. Jackie Reed will follow up with pulmonary on 03/09/2023 to further discuss biopsy results.

## 2023-03-01 SURGERY — BRONCHOSCOPY, WITH EBUS
Anesthesia: General | Laterality: Bilateral

## 2023-03-04 NOTE — Progress Notes (Signed)
Pathology was discussed with patient from [employer].  Patient has appointment on 03/09/2023.  Will need discussion regarding sarcoidosis.  Would likely benefit from pulmonary function tests and a follow-up appointment to be scheduled with Dr. Everardo All in sarcoid clinic.  Thanks,  BLI  Josephine Igo, DO [facility] 03/04/2023 10:54 AM

## 2023-03-09 ENCOUNTER — Ambulatory Visit (HOSPITAL_COMMUNITY)
Admission: RE | Admit: 2023-03-09 | Discharge: 2023-03-09 | Disposition: A | Discharge status: Home / Self Care | Payer: Managed Care, Other (non HMO) | Source: Ambulatory Visit | Attending: Physician Assistant | Admitting: Physician Assistant

## 2023-03-09 ENCOUNTER — Encounter: Payer: Self-pay | Admitting: Acute Care

## 2023-03-09 ENCOUNTER — Ambulatory Visit (INDEPENDENT_AMBULATORY_CARE_PROVIDER_SITE_OTHER): Payer: Managed Care, Other (non HMO) | Admitting: Acute Care

## 2023-03-09 VITALS — BP 138/72 | HR 77 | Temp 97.7°F | Ht 64.0 in | Wt 209.6 lb

## 2023-03-09 DIAGNOSIS — E041 Nontoxic single thyroid nodule: Secondary | ICD-10-CM | POA: Diagnosis not present

## 2023-03-09 DIAGNOSIS — D869 Sarcoidosis, unspecified: Secondary | ICD-10-CM | POA: Diagnosis not present

## 2023-03-09 DIAGNOSIS — Z9889 Other specified postprocedural states: Secondary | ICD-10-CM | POA: Diagnosis not present

## 2023-03-09 NOTE — Patient Instructions (Addendum)
It is good to see you today. I am glad you have done so well after your biopsy.  Your biopsy showed what is most likely sarcoidosis. This is a condition that develops when groups of cells in your immune system form red and swollen lumps called granulomas, in various parts of your body. I will order Pulmonary Function Tests today. You will get a call to get these scheduled.  This will help to trend your pulmonary status over time.  We will have you follow up with Dr. Everardo All as soon as she has an opening.  She is our Sarcoidosis specialist.  She will take great care of you.  Please contact office for sooner follow up if symptoms do not improve or worsen or seek emergency care

## 2023-03-09 NOTE — Progress Notes (Signed)
History of Present Illness Jackie Reed is a 57 y.o. female never smoker referred to see Dr. Tonia Brooms 01/2023 by Maud Deed PA  for lung nodule and lymphadenopathy found on imaging.   Synopsis 57 year old female, past medical history of migraines, hypertension, morbid obesity. And lifelong non-smoker no history of malignancy, no family history of lung cancer. No real significant family history of malignancy. She has no family history of rheumatoid or other auto inflammatory diseases or family history of sarcoidosis. She was having shortness of breath underwent CT imaging of the chest which showed bulky adenopathy. Patient was taken for nuclear medicine PET imaging that was completed that revealed hypermetabolic uptake within the mediastinum. She was seen by Dr. Tonia Brooms 02/09/2023 and plan at that time was for  videobronchoscopy with endobronchial ultrasound and biopsies to diagnose the underlying cause ( malignancy vs sarcoidosis). She underwent procedure 02/19/2023 .   03/09/2023 Pt. Presents for follow up after Flexible video fiberoptic bronchoscopy with endobronchial ultrasound and biopsies on 02/19/2023. She states she has done well after the procedure. She states she has been doing better since the procedure. Specifically her breathing is better. She is less short of breath. She denies any bleeding , fever, or change in secretions. She understands that this is most likely Sarcoidosis. She is in agreement with PFT's and follow up with Dr. Everardo All, who is the practice Sarcoidosis expert.  Pt. States she does not have history of  rash. We have discussed the importance of monitoring her heart and her eyes with yearly eye exams. She has never had a heart echo, so she will need a cardiology referral after seeing Dr. Everardo All.   She has had [place] of her Thyroid today. This was further work up of a finding when she had her PET scan done . This is being followed by her PCP who ordered the [place].  Test  Results: 02/19/2023 Cytology, Pathology LUNG, RIGHT MAINSTEM, ENDOBRONCHIAL BIOPSY:  Benign bronchial mucosa with acute and chronic inflammation  Negative for alveolated lung  Negative for tumor and granulomas    A. LYMPH NODE, STATION 7, FINE NEEDLE ASPIRATION:    FINAL MICROSCOPIC DIAGNOSIS:  - No malignant cells identified  - Consistent with a lymph node with granulomatous inflammation and  necrosis  - Acid-fast bacilli and fungal stains negative   PET Scan 01/29/2023.  Hypermetabolic central right lower lobe mass with multiple hypermetabolic mediastinal, bilateral hilar and right supraclavicular lymph nodes. Findings are most consistent with bronchogenic carcinoma with nodal metastases. 2. Two focal areas of hypermetabolic activity in the subpleural aspect of the right lower lobe are favored to be pulmonary or pleural based. No well-defined pulmonary nodules are identified. 3. Small hypermetabolic lymph nodes in the upper abdomen, suspicious for nodal metastases. No other evidence of metastatic disease in the abdomen or pelvis. No suspicious osseous findings. 4. Hypermetabolic right thyroid nodule. Hypermetabolic thyroid nodules on PET have up to 50% incidence of malignancy. Recommend thyroid US and biopsy.(Ref: J Am Coll Radiol. 2015 Feb;12(2): 143-50). 5.  Aortic Atherosclerosis (ICD10-I70.0).        Latest Ref Rng & Units 01/20/2023    2:24 PM 01/18/2023   10:30 AM 10/06/2021    3:18 PM  CBC  WBC 4.0 - 10.5 K/uL 6.2  5.3  4.6   Hemoglobin 12.0 - 15.0 g/dL 16.1  09.6  04.5   Hematocrit 36.0 - 46.0 % 39.2  38.3  42.6   Platelets 150 - 400 K/uL 438  424  365  Latest Ref Rng & Units 01/20/2023    2:24 PM 01/18/2023   10:30 AM 10/06/2021    3:18 PM  BMP  Glucose 70 - 99 mg/dL 409  99  97   BUN 6 - 20 mg/dL 9  10  7    Creatinine 0.44 - 1.00 mg/dL 8.11  9.14  7.82   Sodium 135 - 145 mmol/L 138  135  139   Potassium 3.5 - 5.1 mmol/L 3.6  3.6  3.5   Chloride 98 -  111 mmol/L 105  105  107   CO2 22 - 32 mmol/L 25  22  24    Calcium 8.9 - 10.3 mg/dL 9.4  8.7  9.2     BNP    Component Value Date/Time   BNP 80.6 01/18/2023 1030    ProBNP No results found for: "PROBNP"  PFT No results found for: "FEV1PRE", "FEV1POST", "FVCPRE", "FVCPOST", "TLC", "DLCOUNC", "PREFEV1FVCRT", "PSTFEV1FVCRT"  No results found.   Past medical hx Past Medical History:  Diagnosis Date   Dyspnea    occasional   E-coli UTI 08/2019   Hepatitis 05/2020   Hepatitis B core antibody positive   Hypertension    Migraine    Plantar fibromatosis    PONV (postoperative nausea and vomiting)    Pre-diabetes    Thyroid nodule 01/29/2023   seen on PET scan   Vitamin B12 deficiency 06/2019   Vitamin D deficiency 06/2019     Social History   Tobacco Use   Smoking status: Never   Smokeless tobacco: Never  Vaping Use   Vaping status: Never Used  Substance Use Topics   Alcohol use: No    Alcohol/week: 0.0 standard drinks of alcohol   Drug use: No    Ms.Tanney reports that she has never smoked. She has never used smokeless tobacco. She reports that she does not drink alcohol and does not use drugs.  Tobacco Cessation: Never smoker    Past surgical hx, Family hx, Social hx all reviewed.  Current Outpatient Medications on File Prior to Visit  Medication Sig   amLODipine (NORVASC) 5 MG tablet Take 2 tablets (10 mg total) by mouth daily.   ibuprofen (ADVIL) 800 MG tablet Take 1 tablet (800 mg total) by mouth 3 (three) times daily.   losartan (COZAAR) 25 MG tablet Take 1 tablet (25 mg total) by mouth daily.   promethazine-dextromethorphan (PROMETHAZINE-DM) 6.25-15 MG/5ML syrup Take 5 mLs by mouth 4 (four) times daily as needed for cough.   No current facility-administered medications on file prior to visit.     Allergies  Allergen Reactions   Amoxicillin Shortness Of Breath   Morphine And Codeine Rash    At injection site   Tramadol Nausea And Vomiting     Review Of Systems:  Constitutional:   No  weight loss, night sweats,  Fevers, chills, fatigue, or  lassitude.  HEENT:   No headaches,  Difficulty swallowing,  Tooth/dental problems, or  Sore throat,                No sneezing, itching, ear ache, nasal congestion, post nasal drip,   CV:  No chest pain,  Orthopnea, PND, swelling in lower extremities, anasarca, dizziness, palpitations, syncope.   GI  No heartburn, indigestion, abdominal pain, nausea, vomiting, diarrhea, change in bowel habits, loss of appetite, bloody stools.   Resp: No shortness of breath with exertion or at rest.  No excess mucus, no productive cough,  No non-productive cough,  No coughing  up of blood.  No change in color of mucus.  No wheezing.  No chest wall deformity, some chest tightness on occasion   Skin: no rash or lesions.  GU: no dysuria, change in color of urine, no urgency or frequency.  No flank pain, no hematuria   MS:  No joint pain or swelling.  No decreased range of motion.  No back pain.  Psych:  No change in mood or affect. No depression or anxiety.  No memory loss.   Vital Signs BP 138/72 (BP Location: Left Arm, Cuff Size: Large)   Pulse 77   Temp 97.7 F (36.5 C) (Temporal)   Ht 5\' 4"  (1.626 m)   Wt 209 lb 9.6 oz (95.1 kg)   SpO2 95%   BMI 35.98 kg/m    Physical Exam:  General- No distress,  A&Ox3, pleasant  ENT: No sinus tenderness, TM clear, pale nasal mucosa, no oral exudate,no post nasal drip, no LAN Cardiac: S1, S2, regular rate and rhythm, no murmur Chest: No wheeze/ rales/ dullness; no accessory muscle use, no nasal flaring, no sternal retractions Abd.: Soft Non-tender, ND, BS +, Body mass index is 35.98 kg/m.  Ext: No clubbing cyanosis, edema Neuro:  normal strength, MAE x 4, A&O x 3 Skin: No rashes, warm and dry, no lesions  Psych: normal mood and behavior   Assessment/Plan Post bronchoscopy to evaluate lymphadenopathy and RLL mass Findings Consistent with a lymph  node with granulomatous inflammation and  necrosis  Plan I am glad you have done so well after your biopsy.  Your biopsy showed what is most likely sarcoidosis. This is a condition that develops when groups of cells in your immune system form red and swollen lumps called granulomas, in various parts of your body. I will order Pulmonary Function Tests today. You will get a call to get these scheduled.  This will help to trend your pulmonary status over time.  We will have you follow up with Dr. Everardo All as soon as she has an opening.  She is our Sarcoidosis specialist.  She will take great care of you.  You will need annual eye exams , and cardiology consult for monitoring. Dr. Everardo All will make referral after she sees you in clinic. >> scheduled for 04/17/2023 Please contact office for sooner follow up if symptoms do not improve or worsen or seek emergency care     I spent 20 minutes dedicated to the care of this patient on the date of this encounter to include pre-visit review of records, face-to-face time with the patient discussing conditions above, post visit ordering of testing, clinical documentation with the electronic health record, making appropriate referrals as documented, and communicating necessary information to the patient's healthcare team.    Bevelyn Ngo, NP 03/09/2023  9:37 AM

## 2023-03-12 ENCOUNTER — Other Ambulatory Visit: Payer: Self-pay | Admitting: Physician Assistant

## 2023-03-12 ENCOUNTER — Telehealth: Payer: Self-pay

## 2023-03-12 DIAGNOSIS — E041 Nontoxic single thyroid nodule: Secondary | ICD-10-CM

## 2023-03-12 NOTE — Telephone Encounter (Signed)
This RN called patient per request of Namon Cirri, PA-C regarding thyroid US results and steps for moving forward. Patient verbalized understanding that based off of the results, it is best to get a thyroid biopsy done. Patient aware that IR should be reaching out to her to schedule biopsy. No questions at this time.

## 2023-03-12 NOTE — Progress Notes (Signed)
Biopsy ordered for enlarged thyroid nodule seen on [place]

## 2023-03-17 ENCOUNTER — Ambulatory Visit (HOSPITAL_COMMUNITY)
Admission: RE | Admit: 2023-03-17 | Discharge: 2023-03-17 | Disposition: A | Discharge status: Home / Self Care | Payer: Managed Care, Other (non HMO) | Source: Ambulatory Visit | Attending: Acute Care | Admitting: Acute Care

## 2023-03-17 DIAGNOSIS — D869 Sarcoidosis, unspecified: Secondary | ICD-10-CM | POA: Diagnosis not present

## 2023-03-17 LAB — PULMONARY FUNCTION TEST
DL/VA % pred: 106 %
DL/VA: 4.51 ml/min/mmHg/L
DLCO unc % pred: 61 %
DLCO unc: 12.53 ml/min/mmHg
FEF 25-75 Post: 1.6 L/sec
FEF 25-75 Pre: 2.34 L/sec
FEF2575-%Pred-Post: 65 %
FEF2575-%Pred-Pre: 95 %
FEV1-%Change-Post: -7 %
FEV1-%Pred-Post: 68 %
FEV1-%Pred-Pre: 74 %
FEV1-Post: 1.79 L
FEV1-Pre: 1.93 L
FEV1FVC-%Change-Post: 0 %
FEV1FVC-%Pred-Pre: 107 %
FEV6-%Change-Post: -7 %
FEV6-%Pred-Post: 64 %
FEV6-%Pred-Pre: 70 %
FEV6-Pre: 2.28 L
FEV6FVC-%Change-Post: 0 %
FEV6FVC-%Pred-Post: 102 %
FEV6FVC-%Pred-Pre: 103 %
FVC-%Change-Post: -6 %
FVC-%Pred-Pre: 67 %
FVC-Pre: 2.28 L
Post FEV1/FVC ratio: 84 %
Post FEV6/FVC ratio: 99 %
Pre FEV1/FVC ratio: 85 %
Pre FEV6/FVC Ratio: 100 %
RV: 3.39 L
TLC: 5.71 L

## 2023-03-17 MED ORDER — ALBUTEROL SULFATE (2.5 MG/3ML) 0.083% IN NEBU
2.5000 mg | INHALATION_SOLUTION | Freq: Once | RESPIRATORY_TRACT | Status: AC
  Administered 2023-03-17: 2.5 mg via RESPIRATORY_TRACT

## 2023-04-01 ENCOUNTER — Ambulatory Visit (INDEPENDENT_AMBULATORY_CARE_PROVIDER_SITE_OTHER): Payer: Managed Care, Other (non HMO) | Admitting: Pulmonary Disease

## 2023-04-01 ENCOUNTER — Encounter: Payer: Self-pay | Admitting: Pulmonary Disease

## 2023-04-01 ENCOUNTER — Encounter (HOSPITAL_COMMUNITY): Payer: Managed Care, Other (non HMO)

## 2023-04-01 VITALS — BP 124/80 | HR 79 | Ht 65.0 in | Wt 207.2 lb

## 2023-04-01 DIAGNOSIS — D869 Sarcoidosis, unspecified: Secondary | ICD-10-CM | POA: Diagnosis not present

## 2023-04-01 MED ORDER — BUDESONIDE-FORMOTEROL FUMARATE 160-4.5 MCG/ACT IN AERO: 2.0000 | INHALATION_SPRAY | Freq: Two times a day (BID) | RESPIRATORY_TRACT | 12 refills | Status: DC

## 2023-04-01 NOTE — Progress Notes (Signed)
@Patient  ID: Jackie Reed, female    DOB: 09-26-1965, 57 y.o.   MRN: [id number]  Chief Complaint  Patient presents with   Acute Visit    Patient complains of increased sob- states even with little to no activity she is easily winded, Dry cough-states she coughs hard enough to cause a headache     Referring provider: Maud Deed, PA  HPI:   57 y.o. with lymphadenopathy and pulmonary nodules with recent biopsy diagnosed with sarcoidosis here for acute visit of intermittent shortness of breath and cough.  Multiple pulmonary notes reviewed.  Seems like his symptoms wax and wane.  Were present prior to diagnosis.  Got better for a period of time.  Has gotten worse again over the last several days.  Intermittent short onset chest tightness and shortness of breath.  Self or spontaneously resolves.  Not on any inhalers.  Has not taken prednisone for sarcoidosis or prior to diagnosis.  Not really any steroid use.  No fever or chills.  Occasional cough.  Sometimes of phlegm sometimes dry.  Reviewed recent PFTs on my review and interpretation revealed no fixed obstruction no bronchodilator response normal lung volumes with air trapping.  Discussed role and rationale for inhaler therapy as a relates to airway inflammation related to sarcoidosis or other etiology such as asthma and an attempt to improve her symptoms.    Questionaires / Pulmonary Flowsheets:   ACT:      No data to display          MMRC:     No data to display          Epworth:      No data to display          Tests:   FENO:  No results found for: "NITRICOXIDE"  PFT:    Latest Ref Rng & Units 03/17/2023    3:00 PM  PFT Results  FVC-Pre L 2.28   FVC-Predicted Pre % 67   FVC-Post L 2.12   FVC-Predicted Post % 63   Pre FEV1/FVC % % 85   Post FEV1/FCV % % 84   FEV1-Pre L 1.93   FEV1-Predicted Pre % 74   FEV1-Post L 1.79   DLCO uncorrected ml/min/mmHg 12.53   DLCO UNC% % 61   DLVA Predicted %  106   TLC L 5.71   TLC % Predicted % 112   RV % Predicted % 174     WALK:      No data to display          Imaging: US THYROID  Result Date: 03/11/2023 CLINICAL DATA:  Incidental on CT. EXAM: THYROID ULTRASOUND TECHNIQUE: Ultrasound examination of the thyroid gland and adjacent soft tissues was performed. COMPARISON:  None. FINDINGS: Parenchymal Echotexture: Normal Isthmus: 0.3 cm Right lobe: 4.9 x 1.9 x 2.1 cm Left lobe: 4.4 x 1.6 x 1.7 cm _________________________________________________________ Estimated total number of nodules >/= 1 cm: 1 Number of spongiform nodules >/=  2 cm not described below (TR1): 0 Number of mixed cystic and solid nodules >/= 1.5 cm not described below (TR2): 0 _________________________________________________________ Nodule labeled 1 is a solitary solid predominantly isoechoic TR 3 nodule in the mid right thyroid lobe measuring 3.0 x 1.9 x 1.9 cm. **Given size (>/= 2.5 cm) and appearance, fine needle aspiration of this mildly suspicious nodule should be considered based on TI-RADS criteria. Additional focused sonographic examination of the right neck soft tissues demonstrates several prominent lymph nodes measuring up to  1.0 cm in the short axis near the upper limit of normal. IMPRESSION: 1. Solitary nodule in the right thyroid lobe (3.0 cm TR 3) meets criteria for biopsy. 2. Additional prominent right cervical lymph nodes measuring near/at the upper limit of normal for size, indeterminate. The above is in keeping with the ACR TI-RADS recommendations - J Am Coll Radiol 2017;14:587-595. Electronically Signed   By: Olive Bass M.D.   On: 03/11/2023 15:15    Lab Results:  CBC    Component Value Date/Time   WBC 6.2 01/20/2023 1424   WBC 5.3 01/18/2023 1030   RBC 5.51 (H) 01/20/2023 1424   HGB 12.4 01/20/2023 1424   HGB 12.2 05/27/2020 1211   HCT 39.2 01/20/2023 1424   HCT 40.2 05/27/2020 1211   PLT 438 (H) 01/20/2023 1424   PLT 294 05/27/2020 1211   MCV  71.1 (L) 01/20/2023 1424   MCV 74 (L) 05/27/2020 1211   MCH 22.5 (L) 01/20/2023 1424   MCHC 31.6 01/20/2023 1424   RDW 15.5 01/20/2023 1424   RDW 16.8 (H) 05/27/2020 1211   LYMPHSABS 0.9 01/20/2023 1424   LYMPHSABS 1.7 05/27/2020 1211   MONOABS 0.9 01/20/2023 1424   EOSABS 0.2 01/20/2023 1424   EOSABS 0.2 05/27/2020 1211   BASOSABS 0.0 01/20/2023 1424   BASOSABS 0.0 05/27/2020 1211    BMET    Component Value Date/Time   NA 138 01/20/2023 1424   NA 140 05/27/2020 1211   K 3.6 01/20/2023 1424   CL 105 01/20/2023 1424   CO2 25 01/20/2023 1424   GLUCOSE 105 (H) 01/20/2023 1424   BUN 9 01/20/2023 1424   BUN 10 05/27/2020 1211   CREATININE 0.64 01/20/2023 1424   CALCIUM 9.4 01/20/2023 1424   GFRNONAA >60 01/20/2023 1424   GFRAA 113 05/27/2020 1211    BNP    Component Value Date/Time   BNP 80.6 01/18/2023 1030    ProBNP No results found for: "PROBNP"  Specialty Problems   None   Allergies  Allergen Reactions   Amoxicillin Shortness Of Breath   Morphine And Codeine Rash    At injection site   Tramadol Nausea And Vomiting    Immunization History  Administered Date(s) Administered   Influenza-Unspecified 06/09/2021, 06/17/2022   PFIZER(Purple Top)SARS-COV-2 Vaccination 12/09/2019, 12/30/2019   PPD Test 05/28/2017    Past Medical History:  Diagnosis Date   Dyspnea    occasional   E-coli UTI 08/2019   Hepatitis 05/2020   Hepatitis B core antibody positive   Hypertension    Migraine    Plantar fibromatosis    PONV (postoperative nausea and vomiting)    Pre-diabetes    Thyroid nodule 01/29/2023   seen on PET scan   Vitamin B12 deficiency 06/2019   Vitamin D deficiency 06/2019    Tobacco History: Social History   Tobacco Use  Smoking Status Never  Smokeless Tobacco Never   Counseling given: Not Answered   Continue to not smoke  Outpatient Encounter Medications as of 04/01/2023  Medication Sig   budesonide-formoterol (SYMBICORT) 160-4.5  MCG/ACT inhaler Inhale 2 puffs into the lungs 2 (two) times daily.   ibuprofen (ADVIL) 800 MG tablet Take 1 tablet (800 mg total) by mouth 3 (three) times daily.   losartan (COZAAR) 25 MG tablet Take 1 tablet (25 mg total) by mouth daily.   promethazine-dextromethorphan (PROMETHAZINE-DM) 6.25-15 MG/5ML syrup Take 5 mLs by mouth 4 (four) times daily as needed for cough.   amLODipine (NORVASC) 5 MG tablet Take  2 tablets (10 mg total) by mouth daily. (Patient not taking: Reported on 04/01/2023)   No facility-administered encounter medications on file as of 04/01/2023.     Review of Systems  Review of Systems  No chest pain nutrition.  Orthopnea or PND.  Comprehensive review of systems otherwise negative. Physical Exam  BP 124/80 (BP Location: Left Arm, Patient Position: Sitting, Cuff Size: Normal)   Pulse 79   Ht 5\' 5"  (1.651 m)   Wt 207 lb 3.2 oz (94 kg)   SpO2 99%   BMI 34.48 kg/m   Wt Readings from Last 5 Encounters:  04/01/23 207 lb 3.2 oz (94 kg)  03/09/23 209 lb 9.6 oz (95.1 kg)  02/19/23 213 lb (96.6 kg)  02/09/23 212 lb (96.2 kg)  01/18/23 215 lb (97.5 kg)    BMI Readings from Last 5 Encounters:  04/01/23 34.48 kg/m  03/09/23 35.98 kg/m  02/19/23 36.56 kg/m  02/09/23 35.28 kg/m  01/18/23 35.78 kg/m     Physical Exam General: Sitting in chair, no acute distress Eyes: EOMI, no icterus Neck: Supple, no JVP Pulmonary: Clear, normal work of breathing, no wheeze, excursion is less than expected Cardiovascular: Warm, no edema Abdomen: Nondistended bowel sounds present MSK: No synovitis, no joint effusion Neuro: Normal gait, no weakness Psych: Normal mood, full affect  Assessment & Plan:   Intermittent chest tightness shortness of breath and cough: Intermittent severity.  Waxes and wanes.  High suspicion for airway inflammation, sarcoidosis related or asthma.  Air trapping on prior PFTs.  Symbicort 2 puff twice daily, ICS/LABA therapy.  Can switch to a different  product pending insurance approval.  Assess response.  Sarcoidosis: Chronic condition.  Recent diagnosis.  Has not taken steroids.  Has not been on the systemic therapies.  Has upcoming visit with Dr. Everardo All for further evaluation.  Encouraged her to do the same.   No follow-ups on file.   Karren Burly, MD 04/01/2023   This appointment required 40 minutes of patient care (this includes precharting, chart review, review of results, face-to-face care, etc.).

## 2023-04-01 NOTE — Patient Instructions (Addendum)
Nice to meet you  Take Symbicort 2 puff twice a day - rinse mouth after every use with water  If too expensive let us know - send [place] a message we will look for an alternative  Youtube "how to use meter dose inhaler"   Return to clinic with Dr Everardo All later this month

## 2023-04-02 ENCOUNTER — Encounter: Payer: Self-pay | Admitting: Pulmonary Disease

## 2023-04-02 DIAGNOSIS — D869 Sarcoidosis, unspecified: Secondary | ICD-10-CM

## 2023-04-02 NOTE — Telephone Encounter (Signed)
Symbicort was sent in yesterday. Can you see what 2 generic medications her insurance will cover. Thank you!

## 2023-04-05 ENCOUNTER — Other Ambulatory Visit (HOSPITAL_COMMUNITY): Payer: Self-pay

## 2023-04-05 NOTE — Telephone Encounter (Signed)
Per test claims Generic Advair Diskus/Wixela and Terese Door are covered for $0.00 at this time in place of Symbicort

## 2023-04-06 NOTE — Telephone Encounter (Signed)
Patient checking on message sent for inhalers. Patient phone number is [phone].

## 2023-04-07 MED ORDER — FLUTICASONE FUROATE-VILANTEROL 100-25 MCG/ACT IN AEPB: 1.0000 | INHALATION_SPRAY | Freq: Every day | RESPIRATORY_TRACT | 5 refills | Status: DC

## 2023-04-07 NOTE — Telephone Encounter (Signed)
Breo ellipta 1 puff daily sent to pharmacy.  JD

## 2023-04-07 NOTE — Telephone Encounter (Signed)
PT calling again. Her Dr. Is out all week so I am sending this to Doctor of the Day, Dr. Francine Graven.  She is out of inhaler.   Pharm is [facility] on [address].

## 2023-04-07 NOTE — Telephone Encounter (Signed)
Please advise on alternative: Per test claims Generic Advair Diskus/Wixela and Terese Door are covered for $0.00 at this time in place of Symbicort   Thank you!

## 2023-04-12 ENCOUNTER — Other Ambulatory Visit (HOSPITAL_COMMUNITY): Payer: Self-pay

## 2023-04-13 ENCOUNTER — Telehealth: Payer: Self-pay | Admitting: Pulmonary Disease

## 2023-04-13 NOTE — Telephone Encounter (Signed)
Patient is calling in regards to [facility] paperwork. Her job has not received a fax back of the completed paperwork yet. Darilyn has been notified.

## 2023-04-14 ENCOUNTER — Encounter: Payer: Self-pay | Admitting: Pulmonary Disease

## 2023-04-22 ENCOUNTER — Ambulatory Visit (HOSPITAL_BASED_OUTPATIENT_CLINIC_OR_DEPARTMENT_OTHER): Payer: Managed Care, Other (non HMO) | Admitting: Pulmonary Disease

## 2023-04-22 ENCOUNTER — Encounter (HOSPITAL_BASED_OUTPATIENT_CLINIC_OR_DEPARTMENT_OTHER): Payer: Self-pay | Admitting: Pulmonary Disease

## 2023-04-22 ENCOUNTER — Telehealth: Payer: Self-pay | Admitting: Pulmonary Disease

## 2023-04-22 VITALS — BP 128/88 | HR 87 | Resp 16 | Ht 65.0 in | Wt 208.0 lb

## 2023-04-22 DIAGNOSIS — R748 Abnormal levels of other serum enzymes: Secondary | ICD-10-CM | POA: Diagnosis not present

## 2023-04-22 DIAGNOSIS — D869 Sarcoidosis, unspecified: Secondary | ICD-10-CM | POA: Insufficient documentation

## 2023-04-22 MED ORDER — PREDNISONE 10 MG PO TABS: ORAL_TABLET | ORAL | 0 refills | Status: DC

## 2023-04-22 NOTE — Telephone Encounter (Signed)
I filled out patient's FMLA form and emailed it to Dr. Everardo All for signature.  Per Dr. George Hugh request, I set it for 03/25/23 - 03/24/24 with patient needing 1-2 days/month off for treatment or office visit.  Also added 1-2 days/month for flare-ups.  Will follow-up with Dr. Everardo All to make sure a copy is mailed to patient.

## 2023-04-22 NOTE — Progress Notes (Signed)
Subjective:   PATIENT ID: Julieta Gutting GENDER: female DOB: 1966-08-10, MRN: [id number]  Chief Complaint  Patient presents with   Follow-up    Discuss PFT. She usually sees [facility], [place] [facility], [facility].     Reason for Visit: New patient to me  Ms. Yona Bodley is a 57 year old never smoker female with sarcoid, HTN, migraines who presents to establish care for sarcoid management.  She was initially seen by Dr. Tonia Brooms for abnormal PET scan for bulky adenopathy. She underwent EBUS on 02/19/23 with sampling station 7 and right mainstem endobronchial biopsies consistent with sarcoidosis. She was seen by Dr. Judeth Horn on 04/01/23 for acute visit for shortness of breath and cough. She was prescribed Breo and albuterol which she takes nightly and helps with her shortness of breath. Coughing is unchanged and starting to have chest pain. Cough has improved by 50% with Breo. Shortness of breath worsened with activity. Fatigue with activity but improves with rest. Denies fever, fatigue, weight loss, visual disturbance, palpitations, abdominal pain, numbness, tingling, lightheadedness, syncope.  Social History: Never smoker  I have personally reviewed patient's past medical/family/social history, allergies, current medications.  Past Medical History:  Diagnosis Date   Dyspnea    occasional   E-coli UTI 08/2019   Hepatitis 05/2020   Hepatitis B core antibody positive   Hypertension    Migraine    Plantar fibromatosis    PONV (postoperative nausea and vomiting)    Pre-diabetes    Thyroid nodule 01/29/2023   seen on PET scan   Vitamin B12 deficiency 06/2019   Vitamin D deficiency 06/2019     Family History  Problem Relation Age of Onset   Heart failure Mother    Diabetes Mother    Heart failure Father    Hypertension Father    Hyperlipidemia Father    Colon cancer Neg Hx    Colon polyps Neg Hx    Breast cancer Neg Hx    Crohn's disease Neg Hx    Esophageal cancer Neg Hx    Rectal cancer  Neg Hx    Stomach cancer Neg Hx    Ulcerative colitis Neg Hx      Social History   Occupational History   Not on file  Tobacco Use   Smoking status: Never   Smokeless tobacco: Never  Vaping Use   Vaping status: Never Used  Substance and Sexual Activity   Alcohol use: No    Alcohol/week: 0.0 standard drinks of alcohol   Drug use: No   Sexual activity: Yes    Birth control/protection: Surgical    Comment: Hysterectomy    Allergies  Allergen Reactions   Amoxicillin Shortness Of Breath   Morphine And Codeine Rash    At injection site   Tramadol Nausea And Vomiting     Outpatient Medications Prior to Visit  Medication Sig Dispense Refill   fluticasone furoate-vilanterol (BREO ELLIPTA) 100-25 MCG/ACT AEPB Inhale 1 puff into the lungs daily. 30 each 5   ibuprofen (ADVIL) 800 MG tablet Take 1 tablet (800 mg total) by mouth 3 (three) times daily. 21 tablet 0   losartan (COZAAR) 25 MG tablet Take 1 tablet (25 mg total) by mouth daily. 90 tablet 3   amLODipine (NORVASC) 5 MG tablet Take 2 tablets (10 mg total) by mouth daily. (Patient not taking: Reported on 04/01/2023) 90 tablet 3   promethazine-dextromethorphan (PROMETHAZINE-DM) 6.25-15 MG/5ML syrup Take 5 mLs by mouth 4 (four) times daily as needed for cough. (Patient  not taking: Reported on 04/22/2023) 180 mL 0   No facility-administered medications prior to visit.    Review of Systems  Constitutional:  Negative for chills, diaphoresis, fever, malaise/fatigue and weight loss.  HENT:  Negative for congestion.   Respiratory:  Positive for cough and shortness of breath. Negative for hemoptysis, sputum production and wheezing.   Cardiovascular:  Negative for chest pain, palpitations and leg swelling.     Objective:   Vitals:   04/22/23 1531  BP: 128/88  Pulse: 87  Resp: 16  SpO2: 97%  Weight: 208 lb 0.1 oz (94.3 kg)  Height: 5\' 5"  (1.651 m)   SpO2: 97 %  Physical Exam: General: Well-appearing, no acute  distress HENT: [place], AT Eyes: EOMI, no scleral icterus Respiratory: Clear to auscultation bilaterally.  No crackles, wheezing or rales Cardiovascular: RRR, -M/R/G, no JVD Extremities:-Edema,-tenderness Neuro: AAO x4, CNII-XII grossly intact Psych: Normal mood, normal affect  Data Reviewed:  Imaging: CTA 01/18/23 - Neg for PE. Bulky right hilar and mediastinal lymphadenopathy. Interlobular septal thickening right right lung base. Multiple small pulmonary nodules in the right lower lobe PET/CT 01/29/23 - Hypermetabolic central right lower lobe mass with multiple hypermetabolic mediastinal, bilateral hilar and right supraclavicular lympho nodes. Two focal areas of hypermetabolic activity in subpleural RLLL. Small hypermetabolic lymph nodules in upper abdomen. Hypermetabolic right thyroid  PFT: 03/17/23 FVC 2.12 (63%) FEV1 1.79 (68%) Ratio 78  TLC 112% DLCO 75% Interpretation: Reduced FVC and FEV1 however normal spirometry and TLC. No obstructive or restrictive defect presents.  Labs:    Latest Ref Rng & Units 01/20/2023    2:24 PM 01/18/2023   10:30 AM 10/06/2021    3:18 PM  CBC  WBC 4.0 - 10.5 K/uL 6.2  5.3  4.6   Hemoglobin 12.0 - 15.0 g/dL 24.4  01.0  27.2   Hematocrit 36.0 - 46.0 % 39.2  38.3  42.6   Platelets 150 - 400 K/uL 438  424  365       Latest Ref Rng & Units 01/20/2023    2:24 PM 01/18/2023   10:30 AM 10/06/2021    3:18 PM  BMP  Glucose 70 - 99 mg/dL 536  99  97   BUN 6 - 20 mg/dL 9  10  7    Creatinine 0.44 - 1.00 mg/dL 6.44  0.34  7.42   Sodium 135 - 145 mmol/L 138  135  139   Potassium 3.5 - 5.1 mmol/L 3.6  3.6  3.5   Chloride 98 - 111 mmol/L 105  105  107   CO2 22 - 32 mmol/L 25  22  24    Calcium 8.9 - 10.3 mg/dL 9.4  8.7  9.2       Latest Ref Rng & Units 01/20/2023    2:24 PM 01/18/2023   10:30 AM 05/27/2020   12:11 PM  Hepatic Function  Total Protein 6.5 - 8.1 g/dL 8.5  7.8  7.9   Albumin 3.5 - 5.0 g/dL 4.0  3.6  4.4   AST 15 - 41 U/L 35  38  21   ALT 0 - 44  U/L 18  19  9    Alk Phosphatase 38 - 126 U/L 152  137  175   Total Bilirubin 0.3 - 1.2 mg/dL 0.7  0.4  0.5   Mildly elevated AP   Pathology A. LUNG, RIGHT MAINSTEM, ENDOBRONCHIAL BIOPSY:  Benign bronchial mucosa with acute and chronic inflammation  Negative for alveolated lung  Negative for tumor  and granulomas   STATION 7 FINAL MICROSCOPIC DIAGNOSIS:  - No malignant cells identified  - Consistent with a lymph node with granulomatous inflammation and  necrosis  - Acid-fast bacilli and fungal stains negative     Assessment & Plan:   Discussion: 57 year old never smoker female with sarcoid, HTN, migraines who presents to establish care for sarcoid management.  We discussed the clinical course of sarcoid and management including serial PFTs, labs, eye exam, and EKG and chest imaging if indicated. If symptoms suggest sarcoid flare in the future, we would manage with steroids +/- biologics. Adverse effects of steroids discussed including bruising, weight gain, fluid retention, Cushingoid features, hypertension, cardiac arrhythmias, osteoporosis, gastric ulcers, increased risk of infection, insomnia and hyperglycemia.  Pulmonary sarcoidosis Cough - improving --Dx in 01/2023 via endobronchial bx --CONTINUE Breo ONE puff ONCE a day --Provided handout on sarcoid and potential medications for management  History of immunosuppression High risk medication management --START prednisone 20 mg x 6 weeks, then prednisone 10 mg x 6 weeks --ORDER CT Chest without contrast in 2 months. Pending results may consider methotrexate --Recommend PPI daily when taking steroids  Sarcoid Monitoring --Recent chest imaging reviewed.  --Annual PFTs.  Last PFTs 02/2023 --Annual ophthalmology exam.   --Recent EKG and TTE reviewed. No evidence of conduction abnormalities. --Routine labs as needed: CBC with diff, CMET, 1, 25 and 25 hydroxy vitamin D, urinary calcium  Will complete FMLA paperwork for  patient   Health Maintenance Immunization History  Administered Date(s) Administered   Influenza-Unspecified 06/09/2021, 06/17/2022   PFIZER(Purple Top)SARS-COV-2 Vaccination 12/09/2019, 12/30/2019   PPD Test 05/28/2017   CT Lung Screen - not qualified  Orders Placed This Encounter  Procedures   CT Chest Wo Contrast    Standing Status:   Future    Standing Expiration Date:   04/21/2024    Order Specific Question:   Is patient pregnant?    Answer:   No    Order Specific Question:   Preferred imaging location?    Answer:   [facility]   Hepatic function panel    Standing Status:   Future    Standing Expiration Date:   04/21/2024   [facility]    Standing Status:   Future    Standing Expiration Date:   04/21/2024   Meds ordered this encounter  Medications   predniSONE (DELTASONE) 10 MG tablet    Sig: Take 2 tablets (20 mg total) by mouth daily with breakfast for 42 days, THEN 1 tablet (10 mg total) daily with breakfast.    Dispense:  20 tablet    Refill:  0    Return in about 3 months (around 07/23/2023).  I have spent a total time of 45-minutes on the day of the appointment reviewing prior documentation, coordinating care and discussing medical diagnosis and plan with the patient/family. Imaging, labs and tests included in this note have been reviewed and interpreted independently by me.  Karol Skarzynski Mechele Collin, MD [facility] 04/22/2023 4:23 PM  Office Number [phone]

## 2023-04-22 NOTE — Patient Instructions (Signed)
High risk medication management --START prednisone 20 mg x 6 weeks, then prednisone 10 mg x 6 weeks --ORDER CT Chest without contrast in 2 months. Pending results may consider methotrexate --Recommend omeprazole 20 mg daily when taking steroids. This helps prevents ulcers

## 2023-04-26 MED ORDER — PREDNISONE 10 MG PO TABS: ORAL_TABLET | ORAL | 0 refills | Status: AC

## 2023-04-26 NOTE — Addendum Note (Signed)
Addended by: Luciano Cutter on: 04/26/2023 02:10 AM   Modules accepted: Orders

## 2023-04-28 NOTE — Telephone Encounter (Signed)
Signed FMLA form was faxed to [employer] - fax# [phone].  Mailed hard copy to patient.

## 2023-04-29 ENCOUNTER — Other Ambulatory Visit: Payer: Managed Care, Other (non HMO)

## 2023-05-06 ENCOUNTER — Other Ambulatory Visit: Payer: Self-pay | Admitting: Physician Assistant

## 2023-05-06 DIAGNOSIS — Z0000 Encounter for general adult medical examination without abnormal findings: Secondary | ICD-10-CM

## 2023-05-25 ENCOUNTER — Ambulatory Visit: Payer: Managed Care, Other (non HMO)

## 2023-05-27 ENCOUNTER — Telehealth: Payer: Self-pay | Admitting: *Deleted

## 2023-05-27 ENCOUNTER — Other Ambulatory Visit: Payer: Self-pay | Admitting: *Deleted

## 2023-05-27 DIAGNOSIS — D869 Sarcoidosis, unspecified: Secondary | ICD-10-CM

## 2023-05-30 ENCOUNTER — Encounter: Payer: Self-pay | Admitting: Physician Assistant

## 2023-05-31 ENCOUNTER — Other Ambulatory Visit: Payer: Self-pay | Admitting: Physician Assistant

## 2023-05-31 ENCOUNTER — Other Ambulatory Visit: Payer: Managed Care, Other (non HMO)

## 2023-05-31 MED ORDER — LORAZEPAM 0.5 MG PO TABS: 0.5000 mg | ORAL_TABLET | Freq: Once | ORAL | 0 refills | Status: AC

## 2023-05-31 NOTE — Telephone Encounter (Signed)
Duplicate encounter

## 2023-06-04 ENCOUNTER — Other Ambulatory Visit: Payer: Managed Care, Other (non HMO)

## 2023-06-04 ENCOUNTER — Other Ambulatory Visit (HOSPITAL_COMMUNITY)
Admission: RE | Admit: 2023-06-04 | Discharge: 2023-06-04 | Disposition: A | Discharge status: Home / Self Care | Payer: Managed Care, Other (non HMO) | Source: Ambulatory Visit | Attending: Student | Admitting: Student

## 2023-06-04 ENCOUNTER — Ambulatory Visit
Admission: RE | Admit: 2023-06-04 | Discharge: 2023-06-04 | Disposition: A | Discharge status: Home / Self Care | Payer: Managed Care, Other (non HMO) | Source: Ambulatory Visit | Attending: Physician Assistant | Admitting: Physician Assistant

## 2023-06-04 DIAGNOSIS — E041 Nontoxic single thyroid nodule: Secondary | ICD-10-CM | POA: Diagnosis present

## 2023-06-08 ENCOUNTER — Telehealth: Payer: Self-pay | Admitting: Physician Assistant

## 2023-06-08 LAB — CYTOLOGY - NON PAP

## 2023-06-08 NOTE — Telephone Encounter (Signed)
I notified Jackie Reed by phone regarding thyroid biopsy results. Findings are consistent with Benign follicular nodule (Bethesda category II). Will notify PCP of results and who can continue to monitor this. All of patient's questions were answered and she expressed understanding of the plan provided.

## 2023-06-15 ENCOUNTER — Other Ambulatory Visit (HOSPITAL_BASED_OUTPATIENT_CLINIC_OR_DEPARTMENT_OTHER): Payer: Self-pay

## 2023-06-21 ENCOUNTER — Encounter (HOSPITAL_BASED_OUTPATIENT_CLINIC_OR_DEPARTMENT_OTHER): Payer: Managed Care, Other (non HMO)

## 2023-06-21 ENCOUNTER — Encounter (HOSPITAL_BASED_OUTPATIENT_CLINIC_OR_DEPARTMENT_OTHER): Payer: Self-pay

## 2023-06-21 ENCOUNTER — Ambulatory Visit (HOSPITAL_BASED_OUTPATIENT_CLINIC_OR_DEPARTMENT_OTHER)
Admission: RE | Admit: 2023-06-21 | Discharge: 2023-06-21 | Disposition: A | Discharge status: Home / Self Care | Payer: Managed Care, Other (non HMO) | Source: Ambulatory Visit | Attending: Pulmonary Disease | Admitting: Pulmonary Disease

## 2023-06-21 DIAGNOSIS — D869 Sarcoidosis, unspecified: Secondary | ICD-10-CM | POA: Insufficient documentation

## 2023-07-23 ENCOUNTER — Ambulatory Visit (HOSPITAL_BASED_OUTPATIENT_CLINIC_OR_DEPARTMENT_OTHER): Payer: Managed Care, Other (non HMO) | Admitting: Pulmonary Disease

## 2023-07-28 ENCOUNTER — Ambulatory Visit (HOSPITAL_BASED_OUTPATIENT_CLINIC_OR_DEPARTMENT_OTHER): Payer: Managed Care, Other (non HMO) | Admitting: Pulmonary Disease

## 2023-07-28 ENCOUNTER — Encounter (HOSPITAL_BASED_OUTPATIENT_CLINIC_OR_DEPARTMENT_OTHER): Payer: Self-pay | Admitting: Pulmonary Disease

## 2023-07-28 VITALS — BP 140/88 | HR 62 | Resp 98 | Ht 65.0 in | Wt 211.8 lb

## 2023-07-28 DIAGNOSIS — D869 Sarcoidosis, unspecified: Secondary | ICD-10-CM | POA: Diagnosis not present

## 2023-07-28 NOTE — Patient Instructions (Signed)
--  Reviewed CT scan with increased pulmonary sarcoid involvement. Discussed risks and benefits of steroids and methotrexate --Possible adverse effects including but not limited to increased risk of infection, liver toxicity, bone marrow suppression, pneumonitis and oral ulcers. --Order lab:CBC w diff, CMP, Hep B core antibody IgM, Hep B surface antigen, HIV (if risk factors), Hep C antibody, TB gold --Will message pharmacy team for initiation of methotrexate with goal 20 mg weekly

## 2023-07-28 NOTE — Progress Notes (Signed)
Subjective:   PATIENT ID: Julieta Gutting GENDER: female DOB: 06/04/1966, MRN: [id number]  Chief Complaint  Patient presents with   Follow-up    Sarcoidosis-Breathing is ok with inhaler. Still SOB with stairs or moving too fast but other than that ok.     Reason for Visit: Follow-up  Ms. Fryda Peinado is a 57 year old never smoker female with sarcoid, HTN, migraines who presents for follow-up for sarcoid management.  Initial consult She was initially seen by Dr. Tonia Brooms for abnormal PET scan for bulky adenopathy. She underwent EBUS on 02/19/23 with sampling station 7 and right mainstem endobronchial biopsies consistent with sarcoidosis. She was seen by Dr. Judeth Horn on 04/01/23 for acute visit for shortness of breath and cough. She was prescribed Breo and albuterol which she takes nightly and helps with her shortness of breath. Coughing is unchanged and starting to have chest pain. Cough has improved by 50% with Breo. Shortness of breath worsened with activity. Fatigue with activity but improves with rest. Denies fever, fatigue, weight loss, visual disturbance, palpitations, abdominal pain, numbness, tingling, lightheadedness, syncope.  07/28/23 She is almost completed with the prednisone taper. Compliant with Breo. Denies shortness of breath cough or wheezing. Some SOB with exertion but inhaler has improved her symptoms.  Social History: Never smoker  Past Medical History:  Diagnosis Date   Dyspnea    occasional   E-coli UTI 08/2019   Hepatitis 05/2020   Hepatitis B core antibody positive   Hypertension    Migraine    Plantar fibromatosis    PONV (postoperative nausea and vomiting)    Pre-diabetes    Thyroid nodule 01/29/2023   seen on PET scan   Vitamin B12 deficiency 06/2019   Vitamin D deficiency 06/2019     Family History  Problem Relation Age of Onset   Heart failure Mother    Diabetes Mother    Heart failure Father    Hypertension Father    Hyperlipidemia Father     Colon cancer Neg Hx    Colon polyps Neg Hx    Breast cancer Neg Hx    Crohn's disease Neg Hx    Esophageal cancer Neg Hx    Rectal cancer Neg Hx    Stomach cancer Neg Hx    Ulcerative colitis Neg Hx      Social History   Occupational History   Not on file  Tobacco Use   Smoking status: Never   Smokeless tobacco: Never  Vaping Use   Vaping status: Never Used  Substance and Sexual Activity   Alcohol use: No    Alcohol/week: 0.0 standard drinks of alcohol   Drug use: No   Sexual activity: Yes    Birth control/protection: Surgical    Comment: Hysterectomy    Allergies  Allergen Reactions   Amoxicillin Shortness Of Breath   Morphine And Codeine Rash    At injection site   Tramadol Nausea And Vomiting     Outpatient Medications Prior to Visit  Medication Sig Dispense Refill   amLODipine (NORVASC) 5 MG tablet Take 2 tablets (10 mg total) by mouth daily. 90 tablet 3   fluticasone furoate-vilanterol (BREO ELLIPTA) 100-25 MCG/ACT AEPB Inhale 1 puff into the lungs daily. 30 each 5   ibuprofen (ADVIL) 800 MG tablet Take 1 tablet (800 mg total) by mouth 3 (three) times daily. 21 tablet 0   losartan (COZAAR) 25 MG tablet Take 1 tablet (25 mg total) by mouth daily. 90 tablet 3  promethazine-dextromethorphan (PROMETHAZINE-DM) 6.25-15 MG/5ML syrup Take 5 mLs by mouth 4 (four) times daily as needed for cough. 180 mL 0   No facility-administered medications prior to visit.    Review of Systems  Constitutional:  Negative for chills, diaphoresis, fever, malaise/fatigue and weight loss.  HENT:  Negative for congestion.   Respiratory:  Negative for cough, hemoptysis, sputum production, shortness of breath and wheezing.   Cardiovascular:  Negative for chest pain, palpitations and leg swelling.     Objective:   Vitals:   07/28/23 0946  BP: (!) 140/88  Pulse: 62  Resp: (!) 98  Weight: 211 lb 12.8 oz (96.1 kg)  Height: 5\' 5"  (1.651 m)     Physical Exam: General:  Well-appearing, no acute distress HENT: [place], AT Eyes: EOMI, no scleral icterus Respiratory: Clear to auscultation bilaterally.  No crackles, wheezing or rales Cardiovascular: RRR, -M/R/G, no JVD Extremities:-Edema,-tenderness Neuro: AAO x4, CNII-XII grossly intact Psych: Normal mood, normal affect  Data Reviewed:  Imaging: CTA 01/18/23 - Neg for PE. Bulky right hilar and mediastinal lymphadenopathy. Interlobular septal thickening right right lung base. Multiple small pulmonary nodules in the right lower lobe PET/CT 01/29/23 - Hypermetabolic central right lower lobe mass with multiple hypermetabolic mediastinal, bilateral hilar and right supraclavicular lympho nodes. Two focal areas of hypermetabolic activity in subpleural RLLL. Small hypermetabolic lymph nodules in upper abdomen. Hypermetabolic right thyroid CT Chest 06/21/23 - Interval development of perilymphatic nodularity. Unchanged mediastinal and hilar lymphadenopathy  PFT: 03/17/23 FVC 2.12 (63%) FEV1 1.79 (68%) Ratio 78  TLC 112% DLCO 75% Interpretation: Reduced FVC and FEV1 however normal spirometry and TLC. No obstructive or restrictive defect presents.  Labs:    Latest Ref Rng & Units 01/20/2023    2:24 PM 01/18/2023   10:30 AM 10/06/2021    3:18 PM  CBC  WBC 4.0 - 10.5 K/uL 6.2  5.3  4.6   Hemoglobin 12.0 - 15.0 g/dL 16.1  09.6  04.5   Hematocrit 36.0 - 46.0 % 39.2  38.3  42.6   Platelets 150 - 400 K/uL 438  424  365       Latest Ref Rng & Units 01/20/2023    2:24 PM 01/18/2023   10:30 AM 10/06/2021    3:18 PM  BMP  Glucose 70 - 99 mg/dL 409  99  97   BUN 6 - 20 mg/dL 9  10  7    Creatinine 0.44 - 1.00 mg/dL 8.11  9.14  7.82   Sodium 135 - 145 mmol/L 138  135  139   Potassium 3.5 - 5.1 mmol/L 3.6  3.6  3.5   Chloride 98 - 111 mmol/L 105  105  107   CO2 22 - 32 mmol/L 25  22  24    Calcium 8.9 - 10.3 mg/dL 9.4  8.7  9.2       Latest Ref Rng & Units 01/20/2023    2:24 PM 01/18/2023   10:30 AM 05/27/2020   12:11 PM   Hepatic Function  Total Protein 6.5 - 8.1 g/dL 8.5  7.8  7.9   Albumin 3.5 - 5.0 g/dL 4.0  3.6  4.4   AST 15 - 41 U/L 35  38  21   ALT 0 - 44 U/L 18  19  9    Alk Phosphatase 38 - 126 U/L 152  137  175   Total Bilirubin 0.3 - 1.2 mg/dL 0.7  0.4  0.5   Mildly elevated AP   Pathology A. LUNG, RIGHT  MAINSTEM, ENDOBRONCHIAL BIOPSY:  Benign bronchial mucosa with acute and chronic inflammation  Negative for alveolated lung  Negative for tumor and granulomas   STATION 7 FINAL MICROSCOPIC DIAGNOSIS:  - No malignant cells identified  - Consistent with a lymph node with granulomatous inflammation and  necrosis  - Acid-fast bacilli and fungal stains negative     Assessment & Plan:   Discussion: 57 year old never smoker female with sarcoid, HTN, migraines who presents for sarcoid management. Reviewed CT scan with interval worsening of sarcoid findings in lung parenchyma. Discussed recommendation for prolonged immunosuppression. Adverse effects of steroids discussed including bruising, weight gain, fluid retention, Cushingoid features, hypertension, cardiac arrhythmias, osteoporosis, gastric ulcers, increased risk of infection, insomnia and hyperglycemia. Recommended methotrexate and discussed risks as noted below. After addressing questions and concerns, patient would like to pursue methotrexate.  Pulmonary sarcoidosis Cough - improving --Dx in 01/2023 via endobronchial bx --CONTINUE Breo ONE puff ONCE a day  History of immunosuppression High risk medication management --04/22/23-07/29/23 Prednisone 20 mg x 6 weeks, 10 mg x 6 weeks --Reviewed CT scan with increased pulmonary sarcoid involvement. Discussed risks and benefits of steroids and methotrexate --Possible adverse effects including but not limited to increased risk of infection, liver toxicity, bone marrow suppression, pneumonitis and oral ulcers. --Order lab:CBC w diff, CMP, Hep B core antibody IgM, Hep B surface antigen, HIV (if risk  factors), Hep C antibody, TB gold --Will message pharmacy team for initiation of methotrexate with goal 20 mg weekly  Sarcoid Monitoring --Recent chest imaging reviewed.  --Annual PFTs.  Last PFTs 02/2023 --Annual ophthalmology exam.   --Recent EKG and TTE reviewed. No evidence of conduction abnormalities. --Routine labs as needed: CBC with diff, CMET, 1, 25 and 25 hydroxy vitamin D, urinary calcium   Health Maintenance Immunization History  Administered Date(s) Administered   Influenza-Unspecified 06/09/2021, 06/17/2022   PFIZER(Purple Top)SARS-COV-2 Vaccination 12/09/2019, 12/30/2019   PPD Test 05/28/2017   CT Lung Screen - not qualified  Orders Placed This Encounter  Procedures   Hepatitis B core antibody, IgM   Hepatitis B surface antigen   CBC with Differential/Platelet   Comprehensive metabolic panel   Hepatitis C antibody   QuantiFERON-TB Gold Plus   No orders of the defined types were placed in this encounter.   Return in about 2 months (around 09/28/2023).  I have spent a total time of 40-minutes on the day of the appointment including chart review, data review, collecting history, coordinating care and discussing medical diagnosis and plan with the patient/family. Past medical history, allergies, medications were reviewed. Pertinent imaging, labs and tests included in this note have been reviewed and interpreted independently by me.  Lexx Monte Mechele Collin, MD [facility] 07/28/2023 10:07 AM  Office Number [phone]

## 2023-07-31 LAB — COMPREHENSIVE METABOLIC PANEL
ALT: 13 [IU]/L (ref 0–32)
AST: 22 [IU]/L (ref 0–40)
Albumin: 4.1 g/dL (ref 3.8–4.9)
Alkaline Phosphatase: 207 [IU]/L — ABNORMAL HIGH (ref 44–121)
BUN/Creatinine Ratio: 17 (ref 9–23)
BUN: 13 mg/dL (ref 6–24)
Bilirubin Total: 0.5 mg/dL (ref 0.0–1.2)
CO2: 23 mmol/L (ref 20–29)
Calcium: 9.4 mg/dL (ref 8.7–10.2)
Chloride: 104 mmol/L (ref 96–106)
Creatinine, Ser: 0.75 mg/dL (ref 0.57–1.00)
Globulin, Total: 3.8 g/dL (ref 1.5–4.5)
Glucose: 83 mg/dL (ref 70–99)
Potassium: 4.2 mmol/L (ref 3.5–5.2)
Sodium: 139 mmol/L (ref 134–144)
Total Protein: 7.9 g/dL (ref 6.0–8.5)
eGFR: 92 mL/min/{1.73_m2} (ref >59)

## 2023-07-31 LAB — CBC WITH DIFFERENTIAL/PLATELET
Basophils Absolute: 0 10*3/uL (ref 0.0–0.2)
Basos: 1 %
EOS (ABSOLUTE): 0.2 10*3/uL (ref 0.0–0.4)
Eos: 6 %
Hematocrit: 40.9 % (ref 34.0–46.6)
Hemoglobin: 12 g/dL (ref 11.1–15.9)
Immature Grans (Abs): 0 10*3/uL (ref 0.0–0.1)
Immature Granulocytes: 1 %
Lymphocytes Absolute: 1 10*3/uL (ref 0.7–3.1)
Lymphs: 27 %
MCH: 21.8 pg — ABNORMAL LOW (ref 26.6–33.0)
MCHC: 29.3 g/dL — ABNORMAL LOW (ref 31.5–35.7)
MCV: 74 fL — ABNORMAL LOW (ref 79–97)
Monocytes Absolute: 0.7 10*3/uL (ref 0.1–0.9)
Monocytes: 18 %
Neutrophils Absolute: 1.8 10*3/uL (ref 1.4–7.0)
Neutrophils: 47 %
Platelets: 385 10*3/uL (ref 150–450)
RBC: 5.51 x10E6/uL — ABNORMAL HIGH (ref 3.77–5.28)
RDW: 15.3 % (ref 11.7–15.4)
WBC: 3.7 10*3/uL (ref 3.4–10.8)

## 2023-07-31 LAB — HEPATITIS B CORE ANTIBODY, IGM: Hep B C IgM: NEGATIVE

## 2023-07-31 LAB — QUANTIFERON-TB GOLD PLUS
QuantiFERON Mitogen Value: >10 [IU]/mL
QuantiFERON Nil Value: 0.05 [IU]/mL
QuantiFERON TB1 Ag Value: 0.67 [IU]/mL
QuantiFERON TB2 Ag Value: 0.88 [IU]/mL
QuantiFERON-TB Gold Plus: POSITIVE — AB

## 2023-07-31 LAB — HEPATITIS B SURFACE ANTIGEN: Hepatitis B Surface Ag: NEGATIVE

## 2023-07-31 LAB — HEPATITIS C ANTIBODY: Hep C Virus Ab: NONREACTIVE

## 2023-08-09 ENCOUNTER — Encounter (HOSPITAL_BASED_OUTPATIENT_CLINIC_OR_DEPARTMENT_OTHER): Payer: Self-pay | Admitting: Pulmonary Disease

## 2023-08-09 DIAGNOSIS — R7612 Nonspecific reaction to cell mediated immunity measurement of gamma interferon antigen response without active tuberculosis: Secondary | ICD-10-CM

## 2023-08-09 NOTE — Telephone Encounter (Signed)
Patient has been notified that results were reviewed including + quantiferon test and recommending referral to ID clinic before initiation of methotrexate for active sarcoid flare.  On phone call she reports: Previously exposed to someone with TB in her teens. Denies shortness of breath, hemoptysis, weight loss, night sweats. She was seen by health dept but determined not to need to be treated.  Will hold on methotrexate until cleared by ID to start. Closing encounter.

## 2023-08-09 NOTE — Telephone Encounter (Signed)
No result notes attached to labs from 07/28/23. OV mentions you were going to send message to pharm team [id number] start but no further documentation in chart.  Please advise, thank you!

## 2023-08-16 ENCOUNTER — Ambulatory Visit: Payer: Managed Care, Other (non HMO) | Admitting: Internal Medicine

## 2023-08-19 NOTE — Telephone Encounter (Signed)
Error

## 2023-08-27 ENCOUNTER — Encounter: Payer: Self-pay | Admitting: Internal Medicine

## 2023-08-27 ENCOUNTER — Other Ambulatory Visit: Payer: Self-pay

## 2023-08-27 ENCOUNTER — Ambulatory Visit: Payer: BLUE CROSS/BLUE SHIELD | Admitting: Internal Medicine

## 2023-08-27 VITALS — BP 164/101 | HR 75 | Temp 98.0°F | Wt 209.0 lb

## 2023-08-27 DIAGNOSIS — Z227 Latent tuberculosis: Secondary | ICD-10-CM

## 2023-08-27 NOTE — Progress Notes (Signed)
 [facility]      Reason for Consult:  Positive Maya Cera   Referring Physician: Dr. Kassie    Patient ID: Jackie Reed, female    DOB: 11-29-1965, 58 y.o.   MRN: [id number]  HPI:   Jackie Reed is here for evaluation of a positive QuantiFERON gold test. He was initially seen by pulmonary in the spring and underwent bronchoscopy for evaluation of adenopathy and diagnosis consistent with sarcoidosis.  She has had ongoing shortness of breath though controlled with inhalers and recently completed steroid taper.  Ovulation last month she had a positive quant to Farren gold test and is here for evaluation of that.  She does recall a history of a positive PPD test years ago and was evaluated by the health department but never given latent TB treatment as I did not feel it was indicated.  He otherwise does not recall any history of exposure to tuberculosis.  She is having some shortness of breath related to her sarcoidosis but no night sweats, no hemoptysis, no weight loss.  Notes of the bronchoscopy noted path with no tumor or granulomas.  Past Medical History:  Diagnosis Date   Dyspnea    occasional   E-coli UTI 08/2019   Hepatitis 05/2020   Hepatitis B core antibody positive   Hypertension    Migraine    Plantar fibromatosis    PONV (postoperative nausea and vomiting)    Pre-diabetes    Thyroid  nodule 01/29/2023   seen on PET scan   Vitamin B12 deficiency 06/2019   Vitamin D  deficiency 06/2019    Prior to Admission medications   Medication Sig Start Date End Date Taking? Authorizing Provider  fluticasone  furoate-vilanterol (BREO ELLIPTA ) 100-25 MCG/ACT AEPB Inhale 1 puff into the lungs daily. 04/07/23  Yes Kara Dorn NOVAK, MD  ibuprofen  (ADVIL ) 800 MG tablet Take 1 tablet (800 mg total) by mouth 3 (three) times daily. 12/30/22  Yes Rising, Asberry, PA-C  losartan  (COZAAR ) 25 MG tablet Take 1 tablet (25 mg total) by mouth daily. 10/06/21  Yes Doretha Folks, MD  amLODipine  (NORVASC ) 5 MG tablet Take 2 tablets (10 mg total) by mouth daily. Patient not taking: Reported on 08/27/2023 01/18/23   Dreama Longs, MD  promethazine -dextromethorphan (PROMETHAZINE -DM) 6.25-15 MG/5ML syrup Take 5 mLs by mouth 4 (four) times daily as needed for cough. Patient not taking: Reported on 08/27/2023 12/30/22   Rising, Asberry, PA-C    Allergies  Allergen Reactions   Amoxicillin  Shortness Of Breath   Morphine  And Codeine Rash    At injection site   Tramadol  Nausea And Vomiting    Social History   Tobacco Use   Smoking status: Never   Smokeless tobacco: Never  Vaping Use   Vaping status: Never Used  Substance Use Topics   Alcohol use: No    Alcohol/week: 0.0 standard drinks of alcohol   Drug use: No    Family History  Problem Relation Age of Onset   Heart failure Mother    Diabetes Mother    Heart failure Father    Hypertension Father    Hyperlipidemia Father    Colon cancer Neg Hx    Colon polyps Neg Hx    Breast cancer Neg Hx    Crohn's disease Neg Hx    Esophageal cancer Neg Hx    Rectal cancer Neg Hx    Stomach cancer Neg Hx    Ulcerative colitis Neg Hx     Review of  Systems  Constitutional: negative for fevers and chills Respiratory: negative for sputum, hemoptysis, or pleurisy/chest pain All other systems reviewed and are negative    Constitutional: in no apparent distress There were no vitals filed for this visit. EYES: anicteric Respiratory: normal respiratory effort   Labs: Lab Results  Component Value Date   WBC 3.7 07/28/2023   HGB 12.0 07/28/2023   HCT 40.9 07/28/2023   MCV 74 (L) 07/28/2023   PLT 385 07/28/2023    Lab Results  Component Value Date   CREATININE 0.75 07/28/2023   BUN 13 07/28/2023   NA 139 07/28/2023   K 4.2 07/28/2023   CL 104 07/28/2023   CO2 23 07/28/2023    Lab Results  Component Value Date   ALT 13 07/28/2023   AST 22 07/28/2023   ALKPHOS 207 (H) 07/28/2023   BILITOT 0.5  07/28/2023   INR 1.1 10/21/2018     Assessment: She has a positive QuantiFERON gold history of a positive PPD confirming likelihood of exposure and latent TB.  With her lymphadenopathy, this is consistent with sarcoidosis both due to radiographic image as well as previous biopsy results.  Is not really making much sputum and likelihood of active TB is pretty minimal.  However I will discuss with Dr. Kassie if bronchoscopy sampling is to be done we can check the AFB.  Plan: 1) consider AFB testing via bronchoscopy 2) look at treatment for latent TB with isoniazid  along with B6 after testing done.  Will use isoniazid  in order to avoid interactions with methotrexate and she can go ahead and start methotrexate concomitantly with isoniazid . Reach out to Dr. Kassie. Care will require ongoing management

## 2023-09-01 ENCOUNTER — Other Ambulatory Visit: Payer: Self-pay | Admitting: Internal Medicine

## 2023-09-01 ENCOUNTER — Telehealth: Payer: Self-pay

## 2023-09-01 MED ORDER — PYRIDOXINE HCL 50 MG PO TABS: 50.0000 mg | ORAL_TABLET | Freq: Every day | ORAL | 8 refills | Status: DC

## 2023-09-01 MED ORDER — ISONIAZID 300 MG PO TABS: 300.0000 mg | ORAL_TABLET | Freq: Every day | ORAL | 8 refills | Status: DC

## 2023-09-01 NOTE — Telephone Encounter (Signed)
 Pt called back; relayed providers message. No questions at this time. Juanita Laster, RMA

## 2023-09-01 NOTE — Telephone Encounter (Signed)
 Let voicemail requesting call back. Juanita Laster, RMA

## 2023-09-01 NOTE — Telephone Encounter (Signed)
-----   Message from Lamar ORN Comer sent at 09/01/2023  3:04 PM EST ----- Please let her know I discussed her case with Dr. Kassie and we are both in agreement that she should go ahead and start the INH + B6 treatment for latent Tb and does not need a bronchoscopy at this time.  This was discussed at the visit that I would check with Dr. Kassie first. It has been sent to her pharmacy.  thanks

## 2023-10-01 ENCOUNTER — Encounter: Payer: Self-pay | Admitting: Internal Medicine

## 2023-10-01 ENCOUNTER — Ambulatory Visit (INDEPENDENT_AMBULATORY_CARE_PROVIDER_SITE_OTHER): Payer: No Typology Code available for payment source | Admitting: Internal Medicine

## 2023-10-01 ENCOUNTER — Other Ambulatory Visit: Payer: Self-pay

## 2023-10-01 VITALS — BP 162/73 | HR 57 | Temp 97.8°F | Ht 64.0 in | Wt 211.0 lb

## 2023-10-01 DIAGNOSIS — Z227 Latent tuberculosis: Secondary | ICD-10-CM | POA: Diagnosis not present

## 2023-10-01 DIAGNOSIS — D869 Sarcoidosis, unspecified: Secondary | ICD-10-CM | POA: Diagnosis not present

## 2023-10-01 DIAGNOSIS — Z5181 Encounter for therapeutic drug level monitoring: Secondary | ICD-10-CM | POA: Insufficient documentation

## 2023-10-01 NOTE — Assessment & Plan Note (Signed)
 He is now on treatment for latent TB.  Will plan on 6 months of isoniazid  with B6.  Will an HIV antibody routinely.  Vies can follow-up in 3 months

## 2023-10-01 NOTE — Assessment & Plan Note (Addendum)
 Followed by pulmonary with consideration of methotrexate treatment.  She is on isoniazid  and from an infectious disease standpoint is okay to start methotrexate when felt to be appropriate

## 2023-10-01 NOTE — Assessment & Plan Note (Signed)
 Now on treatment and will have a hepatic function panel checked to assure no issues.

## 2023-10-01 NOTE — Progress Notes (Signed)
   Subjective:    Patient ID: Jackie Reed, female    DOB: [birth year], 58 y.o.   MRN: [id number]  HPI Jackie Reed is here for follow up of latent Tb. He is here for follow-up and treatment for latent TB with isoniazid .  She has a remote history of a positive PPD and recent QuantiFERON gold positivity and followed by pulmonary as well and I started her on isoniazid  along with vitamin B6.  She is doing well with that with no concerns.  Taking daily though had not started the B6 yet.  Rash or stomach issues.   Review of Systems  Constitutional:  Negative for fatigue and fever.  Respiratory:  Negative for cough and shortness of breath.        Objective:   Physical Exam Eyes:     General: No scleral icterus. Pulmonary:     Effort: Pulmonary effort is normal.  Neurological:     Mental Status: She is alert.   SH: no tobacco        Assessment & Plan:

## 2023-10-02 LAB — HEPATIC FUNCTION PANEL
AG Ratio: 1 (calc) (ref 1.0–2.5)
ALT: 14 U/L (ref 6–29)
AST: 24 U/L (ref 10–35)
Albumin: 3.9 g/dL (ref 3.6–5.1)
Alkaline phosphatase (APISO): 186 U/L — ABNORMAL HIGH (ref 37–153)
Bilirubin, Direct: 0.1 mg/dL (ref 0.0–0.2)
Globulin: 3.9 g/dL — ABNORMAL HIGH (ref 1.9–3.7)
Indirect Bilirubin: 0.3 mg/dL (ref 0.2–1.2)
Total Bilirubin: 0.4 mg/dL (ref 0.2–1.2)
Total Protein: 7.8 g/dL (ref 6.1–8.1)

## 2023-10-02 LAB — HIV ANTIBODY (ROUTINE TESTING W REFLEX): HIV 1&2 Ab, 4th Generation: NONREACTIVE

## 2023-10-13 ENCOUNTER — Ambulatory Visit (HOSPITAL_BASED_OUTPATIENT_CLINIC_OR_DEPARTMENT_OTHER): Payer: Managed Care, Other (non HMO) | Admitting: Pulmonary Disease

## 2024-01-18 ENCOUNTER — Ambulatory Visit: Payer: BLUE CROSS/BLUE SHIELD | Admitting: Internal Medicine

## 2024-01-19 ENCOUNTER — Inpatient Hospital Stay (HOSPITAL_COMMUNITY)
Admission: EM | Admit: 2024-01-19 | Discharge: 2024-01-28 | DRG: 305 | Disposition: A | Discharge status: Rehabilitation Facility | Attending: Neurology | Admitting: Neurology

## 2024-01-19 ENCOUNTER — Ambulatory Visit: Admitting: Internal Medicine

## 2024-01-19 ENCOUNTER — Inpatient Hospital Stay (HOSPITAL_COMMUNITY)

## 2024-01-19 ENCOUNTER — Emergency Department (HOSPITAL_COMMUNITY)

## 2024-01-19 ENCOUNTER — Other Ambulatory Visit: Payer: Self-pay

## 2024-01-19 ENCOUNTER — Encounter (HOSPITAL_COMMUNITY): Payer: Self-pay

## 2024-01-19 DIAGNOSIS — K5901 Slow transit constipation: Secondary | ICD-10-CM | POA: Diagnosis not present

## 2024-01-19 DIAGNOSIS — I10 Essential (primary) hypertension: Secondary | ICD-10-CM | POA: Diagnosis present

## 2024-01-19 DIAGNOSIS — M47816 Spondylosis without myelopathy or radiculopathy, lumbar region: Secondary | ICD-10-CM | POA: Diagnosis not present

## 2024-01-19 DIAGNOSIS — E66812 Obesity, class 2: Secondary | ICD-10-CM | POA: Diagnosis present

## 2024-01-19 DIAGNOSIS — R29702 NIHSS score 2: Secondary | ICD-10-CM

## 2024-01-19 DIAGNOSIS — I639 Cerebral infarction, unspecified: Principal | ICD-10-CM | POA: Diagnosis present

## 2024-01-19 DIAGNOSIS — R7303 Prediabetes: Secondary | ICD-10-CM | POA: Diagnosis present

## 2024-01-19 DIAGNOSIS — Z91148 Patient's other noncompliance with medication regimen for other reason: Secondary | ICD-10-CM | POA: Diagnosis not present

## 2024-01-19 DIAGNOSIS — G43909 Migraine, unspecified, not intractable, without status migrainosus: Secondary | ICD-10-CM | POA: Diagnosis present

## 2024-01-19 DIAGNOSIS — Z6837 Body mass index (BMI) 37.0-37.9, adult: Secondary | ICD-10-CM | POA: Diagnosis not present

## 2024-01-19 DIAGNOSIS — G894 Chronic pain syndrome: Secondary | ICD-10-CM | POA: Diagnosis not present

## 2024-01-19 DIAGNOSIS — F09 Unspecified mental disorder due to known physiological condition: Secondary | ICD-10-CM | POA: Diagnosis not present

## 2024-01-19 DIAGNOSIS — R29705 NIHSS score 5: Secondary | ICD-10-CM | POA: Diagnosis not present

## 2024-01-19 DIAGNOSIS — G8192 Hemiplegia, unspecified affecting left dominant side: Secondary | ICD-10-CM | POA: Diagnosis present

## 2024-01-19 DIAGNOSIS — G4489 Other headache syndrome: Secondary | ICD-10-CM | POA: Diagnosis not present

## 2024-01-19 DIAGNOSIS — G8190 Hemiplegia, unspecified affecting unspecified side: Secondary | ICD-10-CM | POA: Diagnosis not present

## 2024-01-19 DIAGNOSIS — Z90711 Acquired absence of uterus with remaining cervical stump: Secondary | ICD-10-CM

## 2024-01-19 DIAGNOSIS — I674 Hypertensive encephalopathy: Secondary | ICD-10-CM | POA: Diagnosis present

## 2024-01-19 DIAGNOSIS — I739 Peripheral vascular disease, unspecified: Secondary | ICD-10-CM | POA: Diagnosis not present

## 2024-01-19 DIAGNOSIS — F444 Conversion disorder with motor symptom or deficit: Secondary | ICD-10-CM | POA: Diagnosis not present

## 2024-01-19 DIAGNOSIS — R2990 Unspecified symptoms and signs involving the nervous system: Secondary | ICD-10-CM | POA: Diagnosis not present

## 2024-01-19 DIAGNOSIS — R29818 Other symptoms and signs involving the nervous system: Secondary | ICD-10-CM | POA: Diagnosis present

## 2024-01-19 DIAGNOSIS — K5900 Constipation, unspecified: Secondary | ICD-10-CM | POA: Diagnosis not present

## 2024-01-19 DIAGNOSIS — Z79899 Other long term (current) drug therapy: Secondary | ICD-10-CM

## 2024-01-19 DIAGNOSIS — E785 Hyperlipidemia, unspecified: Secondary | ICD-10-CM | POA: Diagnosis present

## 2024-01-19 DIAGNOSIS — R569 Unspecified convulsions: Secondary | ICD-10-CM | POA: Diagnosis not present

## 2024-01-19 DIAGNOSIS — R531 Weakness: Secondary | ICD-10-CM | POA: Diagnosis not present

## 2024-01-19 DIAGNOSIS — R310 Gross hematuria: Secondary | ICD-10-CM | POA: Diagnosis not present

## 2024-01-19 DIAGNOSIS — N179 Acute kidney failure, unspecified: Secondary | ICD-10-CM | POA: Diagnosis not present

## 2024-01-19 DIAGNOSIS — I69354 Hemiplegia and hemiparesis following cerebral infarction affecting left non-dominant side: Secondary | ICD-10-CM | POA: Diagnosis not present

## 2024-01-19 DIAGNOSIS — Z227 Latent tuberculosis: Secondary | ICD-10-CM | POA: Diagnosis not present

## 2024-01-19 DIAGNOSIS — Z8249 Family history of ischemic heart disease and other diseases of the circulatory system: Secondary | ICD-10-CM

## 2024-01-19 DIAGNOSIS — Z743 Need for continuous supervision: Secondary | ICD-10-CM | POA: Diagnosis not present

## 2024-01-19 DIAGNOSIS — I160 Hypertensive urgency: Principal | ICD-10-CM | POA: Diagnosis present

## 2024-01-19 DIAGNOSIS — G8194 Hemiplegia, unspecified affecting left nondominant side: Secondary | ICD-10-CM | POA: Diagnosis not present

## 2024-01-19 DIAGNOSIS — N1330 Unspecified hydronephrosis: Secondary | ICD-10-CM | POA: Diagnosis not present

## 2024-01-19 DIAGNOSIS — G9340 Encephalopathy, unspecified: Secondary | ICD-10-CM | POA: Diagnosis not present

## 2024-01-19 DIAGNOSIS — D869 Sarcoidosis, unspecified: Secondary | ICD-10-CM | POA: Diagnosis present

## 2024-01-19 DIAGNOSIS — F449 Dissociative and conversion disorder, unspecified: Secondary | ICD-10-CM | POA: Diagnosis not present

## 2024-01-19 HISTORY — DX: Sarcoidosis, unspecified: D86.9

## 2024-01-19 HISTORY — DX: Latent tuberculosis: Z22.7

## 2024-01-19 LAB — CBC
HCT: 42.3 % (ref 36.0–46.0)
Hemoglobin: 12.7 g/dL (ref 12.0–15.0)
MCH: 22.5 pg — ABNORMAL LOW (ref 26.0–34.0)
MCHC: 30 g/dL (ref 30.0–36.0)
MCV: 74.9 fL — ABNORMAL LOW (ref 80.0–100.0)
Platelets: 395 10*3/uL (ref 150–400)
RBC: 5.65 MIL/uL — ABNORMAL HIGH (ref 3.87–5.11)
RDW: 15.9 % — ABNORMAL HIGH (ref 11.5–15.5)
WBC: 5.1 10*3/uL (ref 4.0–10.5)
nRBC: 0 % (ref 0.0–0.2)

## 2024-01-19 LAB — COMPREHENSIVE METABOLIC PANEL WITH GFR
ALT: 21 U/L (ref 0–44)
AST: 36 U/L (ref 15–41)
Albumin: 4 g/dL (ref 3.5–5.0)
Alkaline Phosphatase: 213 U/L — ABNORMAL HIGH (ref 38–126)
Anion gap: 11 (ref 5–15)
BUN: 9 mg/dL (ref 6–20)
CO2: 25 mmol/L (ref 22–32)
Calcium: 9.4 mg/dL (ref 8.9–10.3)
Chloride: 100 mmol/L (ref 98–111)
Creatinine, Ser: 0.87 mg/dL (ref 0.44–1.00)
GFR, Estimated: >60 mL/min (ref >60)
Glucose, Bld: 101 mg/dL — ABNORMAL HIGH (ref 70–99)
Potassium: 3.1 mmol/L — ABNORMAL LOW (ref 3.5–5.1)
Sodium: 136 mmol/L (ref 135–145)
Total Bilirubin: 0.7 mg/dL (ref 0.0–1.2)
Total Protein: 8.7 g/dL — ABNORMAL HIGH (ref 6.5–8.1)

## 2024-01-19 LAB — RAPID URINE DRUG SCREEN, HOSP PERFORMED
Amphetamines: NOT DETECTED
Barbiturates: NOT DETECTED
Benzodiazepines: NOT DETECTED
Cocaine: NOT DETECTED
Opiates: NOT DETECTED
Tetrahydrocannabinol: NOT DETECTED

## 2024-01-19 LAB — APTT: aPTT: 34 s (ref 24–36)

## 2024-01-19 LAB — DIFFERENTIAL
Abs Immature Granulocytes: 0.02 10*3/uL (ref 0.00–0.07)
Basophils Absolute: 0 10*3/uL (ref 0.0–0.1)
Basophils Relative: 1 %
Eosinophils Absolute: 0.3 10*3/uL (ref 0.0–0.5)
Eosinophils Relative: 6 %
Immature Granulocytes: 0 %
Lymphocytes Relative: 27 %
Lymphs Abs: 1.4 10*3/uL (ref 0.7–4.0)
Monocytes Absolute: 0.8 10*3/uL (ref 0.1–1.0)
Monocytes Relative: 15 %
Neutro Abs: 2.6 10*3/uL (ref 1.7–7.7)
Neutrophils Relative %: 51 %

## 2024-01-19 LAB — PROTIME-INR
INR: 1.1 (ref 0.8–1.2)
Prothrombin Time: 14.6 s (ref 11.4–15.2)

## 2024-01-19 LAB — CBG MONITORING, ED: Glucose-Capillary: 85 mg/dL (ref 70–99)

## 2024-01-19 LAB — I-STAT CHEM 8, ED
BUN: 7 mg/dL (ref 6–20)
Calcium, Ion: 1.23 mmol/L (ref 1.15–1.40)
Chloride: 102 mmol/L (ref 98–111)
Creatinine, Ser: 0.9 mg/dL (ref 0.44–1.00)
Glucose, Bld: 101 mg/dL — ABNORMAL HIGH (ref 70–99)
HCT: 43 % (ref 36.0–46.0)
Hemoglobin: 14.6 g/dL (ref 12.0–15.0)
Potassium: 3.3 mmol/L — ABNORMAL LOW (ref 3.5–5.1)
Sodium: 140 mmol/L (ref 135–145)
TCO2: 27 mmol/L (ref 22–32)

## 2024-01-19 LAB — MRSA NEXT GEN BY PCR, NASAL: MRSA by PCR Next Gen: NOT DETECTED

## 2024-01-19 LAB — ETHANOL: Alcohol, Ethyl (B): <15 mg/dL (ref <15)

## 2024-01-19 MED ORDER — ORAL CARE MOUTH RINSE
15.0000 mL | OROMUCOSAL | Status: DC | PRN
Start: 2024-01-19 — End: 2024-01-28

## 2024-01-19 MED ORDER — MAGNESIUM SULFATE 2 GM/50ML IV SOLN
2.0000 g | Freq: Once | INTRAVENOUS | Status: AC
  Administered 2024-01-19: 2 g via INTRAVENOUS
  Filled 2024-01-19: qty 50

## 2024-01-19 MED ORDER — ONDANSETRON HCL 4 MG/2ML IJ SOLN
INTRAMUSCULAR | Status: AC
  Filled 2024-01-19: qty 2

## 2024-01-19 MED ORDER — FLUTICASONE FUROATE-VILANTEROL 100-25 MCG/ACT IN AEPB
1.0000 | INHALATION_SPRAY | Freq: Every day | RESPIRATORY_TRACT | Status: DC
  Administered 2024-01-20 – 2024-01-28 (×8): 1 via RESPIRATORY_TRACT
  Filled 2024-01-19 (×2): qty 28

## 2024-01-19 MED ORDER — TENECTEPLASE FOR STROKE
0.2500 mg/kg | PACK | Freq: Once | INTRAVENOUS | Status: AC
  Administered 2024-01-19: 24 mg via INTRAVENOUS

## 2024-01-19 MED ORDER — SODIUM CHLORIDE 0.9 % IV SOLN: INTRAVENOUS | Status: DC

## 2024-01-19 MED ORDER — POTASSIUM CHLORIDE 20 MEQ PO PACK
40.0000 meq | PACK | Freq: Once | ORAL | Status: AC
  Administered 2024-01-19: 40 meq via ORAL
  Filled 2024-01-19: qty 2

## 2024-01-19 MED ORDER — METOCLOPRAMIDE HCL 5 MG/ML IJ SOLN
10.0000 mg | Freq: Once | INTRAMUSCULAR | Status: AC
  Administered 2024-01-19: 10 mg via INTRAVENOUS
  Filled 2024-01-19: qty 2

## 2024-01-19 MED ORDER — ONDANSETRON HCL 4 MG/2ML IJ SOLN
4.0000 mg | Freq: Once | INTRAMUSCULAR | Status: AC
  Administered 2024-01-19: 4 mg via INTRAVENOUS

## 2024-01-19 MED ORDER — CLEVIDIPINE BUTYRATE 0.5 MG/ML IV EMUL
0.0000 mg/h | INTRAVENOUS | Status: DC
  Filled 2024-01-19 (×2): qty 50

## 2024-01-19 MED ORDER — IOHEXOL 350 MG/ML SOLN
75.0000 mL | Freq: Once | INTRAVENOUS | Status: AC | PRN
  Administered 2024-01-19: 75 mL via INTRAVENOUS

## 2024-01-19 MED ORDER — ISONIAZID 300 MG PO TABS
300.0000 mg | ORAL_TABLET | Freq: Every day | ORAL | Status: DC
  Administered 2024-01-20 – 2024-01-24 (×5): 300 mg via ORAL
  Filled 2024-01-19 (×5): qty 1

## 2024-01-19 MED ORDER — SODIUM CHLORIDE (PF) 0.9 % IJ SOLN
INTRAMUSCULAR | Status: AC
Start: 2024-01-19 — End: ?
  Filled 2024-01-19: qty 50

## 2024-01-19 MED ORDER — VITAMIN B-6 25 MG PO TABS
50.0000 mg | ORAL_TABLET | Freq: Every day | ORAL | Status: DC
  Administered 2024-01-20 – 2024-01-28 (×9): 50 mg via ORAL
  Filled 2024-01-19 (×9): qty 2

## 2024-01-19 MED ORDER — HYDRALAZINE HCL 20 MG/ML IJ SOLN
10.0000 mg | Freq: Once | INTRAMUSCULAR | Status: AC
  Administered 2024-01-19: 10 mg via INTRAVENOUS
  Filled 2024-01-19: qty 1

## 2024-01-19 MED ORDER — LABETALOL HCL 5 MG/ML IV SOLN
10.0000 mg | Freq: Once | INTRAVENOUS | Status: AC
  Administered 2024-01-19: 10 mg via INTRAVENOUS

## 2024-01-19 MED ORDER — LABETALOL HCL 5 MG/ML IV SOLN
INTRAVENOUS | Status: AC
  Administered 2024-01-19: 10 mg via INTRAVENOUS
  Filled 2024-01-19: qty 4

## 2024-01-19 MED ORDER — ACETAMINOPHEN 325 MG PO TABS
650.0000 mg | ORAL_TABLET | Freq: Four times a day (QID) | ORAL | Status: DC | PRN
  Administered 2024-01-19 – 2024-01-20 (×2): 650 mg via ORAL
  Filled 2024-01-19 (×3): qty 2

## 2024-01-19 MED ORDER — LABETALOL HCL 5 MG/ML IV SOLN: 10.0000 mg | Freq: Once | INTRAVENOUS | Status: AC

## 2024-01-19 MED ORDER — CHLORHEXIDINE GLUCONATE CLOTH 2 % EX PADS
6.0000 | MEDICATED_PAD | Freq: Every day | CUTANEOUS | Status: DC
  Administered 2024-01-19 – 2024-01-20 (×2): 6 via TOPICAL

## 2024-01-19 NOTE — H&P (Signed)
 NEUROLOGY H&P NOTE   Date of service: Jan 19, 2024 Patient Name: Jackie Reed MRN:  [id number] DOB:  May 13, 1966 Chief Complaint: "CODE STROKE"  History of Present Illness  Jackie Reed is a 58 y.o. female with hx of HTN, sarcoidosis, latent TB currently undergoing treatment, migraines, pre-DM, medication noncompliance who presented to [facility] ED 5/28 due to sudden onset of left arm and leg weakness.  Code stroke was activated and patient was evaluated via tele-neurology. NIH: 2 for left arm weakness and left leg weakness. Patient did endorse a headache at the time, does have history of migraines. CT Head negative. She was extremely hypertensive on assessment, required 1 dose of labetalol and 1 dose of hydralazine prior to administration of TNK (@ 1523).  Patient then transferred to [facility] ICU for post-TNK monitoring and tight BP control.   Admission to [facility] ICU assessment NIHSS components Score: Comment  1a Level of Conscious 0[x]  1[]  2[]  3[]      1b LOC Questions 0[x]  1[]  2[]       1c LOC Commands 0[x]  1[]  2[]       2 Best Gaze 0[x]  1[]  2[]       3 Visual 0[x]  1[]  2[]  3[]      4 Facial Palsy 0[]  1[x]  2[]  3[]      5a Motor Arm - left 0[]  1[x]  2[]  3[]  4[]  UN[]    5b Motor Arm - Right 0[x]  1[]  2[]  3[]  4[]  UN[]    6a Motor Leg - Left 0[]  1[x]  2[]  3[]  4[]  UN[]    6b Motor Leg - Right 0[x]  1[]  2[]  3[]  4[]  UN[]    7 Limb Ataxia 0[]  1[x]  2[]  UN[]     Dysmetria LUE  8 Sensory 0[]  1[x]  2[]  UN[]      9 Best Language 0[x]  1[]  2[]  3[]      10 Dysarthria 0[x]  1[]  2[]  UN[]      11 Extinct. and Inattention 0[x]  1[]  2[]       TOTAL:   5      ROS  Comprehensive ROS performed and pertinent positives documented in the HPI  Past History   Past Medical History:  Diagnosis Date   Dyspnea    occasional   E-coli UTI 08/2019   Hepatitis 05/2020   Hepatitis B core antibody positive   Hypertension    Migraine    Plantar fibromatosis    PONV (postoperative nausea and vomiting)    Pre-diabetes    Thyroid  nodule  01/29/2023   seen on PET scan   Vitamin B12 deficiency 06/2019   Vitamin D  deficiency 06/2019   Past Surgical History:  Procedure Laterality Date   ABDOMINAL HYSTERECTOMY     FINE NEEDLE ASPIRATION  02/19/2023   Procedure: FINE NEEDLE ASPIRATION (FNA) LINEAR;  Surgeon: Prudy Brownie, DO;  Location: [facility];  Service: Cardiopulmonary;;   FOOT SURGERY Bilateral 1985   MASS EXCISION Right 05/19/2018   Procedure: EXCISION PALMAR NODULES RIGHT HAND;  Surgeon: Brunilda Capra, MD;  Location: [facility];  Service: Orthopedics;  Laterality: Right;   PARTIAL HYSTERECTOMY  1997   VIDEO BRONCHOSCOPY WITH ENDOBRONCHIAL ULTRASOUND Bilateral 02/19/2023   Procedure: VIDEO BRONCHOSCOPY WITH ENDOBRONCHIAL ULTRASOUND;  Surgeon: Prudy Brownie, DO;  Location: [facility];  Service: Cardiopulmonary;  Laterality: Bilateral;   Family History  Problem Relation Age of Onset   Heart failure Mother    Diabetes Mother    Heart failure Father    Hypertension Father    Hyperlipidemia Father    Colon cancer Neg Hx    Colon  polyps Neg Hx    Breast cancer Neg Hx    Crohn's disease Neg Hx    Esophageal cancer Neg Hx    Rectal cancer Neg Hx    Stomach cancer Neg Hx    Ulcerative colitis Neg Hx    Social History   Socioeconomic History   Marital status: Widowed    Spouse name: Not on file   Number of children: Not on file   Years of education: Not on file   Highest education level: Not on file  Occupational History   Not on file  Tobacco Use   Smoking status: Never   Smokeless tobacco: Never  Vaping Use   Vaping status: Never Used  Substance and Sexual Activity   Alcohol use: No    Alcohol/week: 0.0 standard drinks of alcohol   Drug use: No   Sexual activity: Yes    Birth control/protection: Surgical    Comment: Hysterectomy  Other Topics Concern   Not on file  Social History Narrative   Not on file   Social Drivers of Health   Financial Resource Strain: Low Risk   (09/28/2023)   Received from [facility]   Overall Financial Resource Strain (CARDIA)    Difficulty of Paying Living Expenses: Not very hard  Food Insecurity: No Food Insecurity (09/28/2023)   [facility]   Hunger Vital Sign    Worried About Running Out of Food in the Last Year: Never true    Ran Out of Food in the Last Year: Never true  Transportation Needs: No Transportation Needs (09/28/2023)   Received from [facility] - Transportation    Lack of Transportation (Medical): No    Lack of Transportation (Non-Medical): No  Physical Activity: Not on file  Stress: Not on file  Social Connections: Unknown (12/22/2021)   Received from [facility]   Social Network    Social Network: Not on file   Allergies  Allergen Reactions   Amoxicillin  Shortness Of Breath   Morphine  And Codeine Rash    At injection site   Tramadol  Nausea And Vomiting    Medications   Current Facility-Administered Medications:    0.9 %  sodium chloride  infusion, , Intravenous, Continuous, Lehner, Erin C, NP   acetaminophen  (TYLENOL ) tablet 650 mg, 650 mg, Oral, Q6H PRN, Lehner, Erin C, NP   Chlorhexidine  Gluconate Cloth 2 % PADS 6 each, 6 each, Topical, Daily, Augustin Leber, MD, 6 each at 01/19/24 1731   clevidipine (CLEVIPREX) infusion 0.5 mg/mL, 0-21 mg/hr, Intravenous, Continuous, Tona Francis, MD, Held at 01/19/24 1611   magnesium sulfate IVPB 2 g 50 mL, 2 g, Intravenous, Once, Amelie Baize, Erin C, NP   ondansetron  (ZOFRAN ) 4 MG/2ML injection, , , ,    Oral care mouth rinse, 15 mL, Mouth Rinse, PRN, Augustin Leber, MD   potassium chloride (KLOR-CON) packet 40 mEq, 40 mEq, Oral, Once, Audrene Lease, NP   Vitals   Vitals:   01/19/24 1630 01/19/24 1645 01/19/24 1651 01/19/24 1735  BP: (!) 150/84  (!) 151/88   Pulse: 73 78 78   Resp: (!) 23 (!) 24 18   Temp:    98.4 F (36.9 C)  TempSrc:    Oral  SpO2: 100% 99% 100%   Weight:    99.6 kg  Height:          Body mass index is 37.69 kg/m.  Physical Exam   Constitutional: Appears well-developed and well-nourished.  Cardiovascular: Normal rate and regular rhythm.  Respiratory: Effort normal, non-labored breathing.   Neurologic Examination   Neuro: Mental Status: Patient is awake, alert, oriented to person, place, month, year, and situation. Patient is able to give a clear and coherent history. No signs of aphasia or neglect Cranial Nerves: II: Visual Fields are full. Pupils are equal, round, and reactive to light.   III,IV, VI: EOMI without ptosis or diploplia.  V: Facial sensation is symmetric to temperature VII: Facial movement is symmetric.  VIII: hearing is intact to voice X: Uvula elevates symmetrically XI: Shoulder shrug is symmetric. XII: tongue is midline without atrophy or fasciculations.  Motor: Tone is normal. Bulk is normal.  LUE: mild drift, 4/5 UJW:JXBJ drift, 4/5 Sensory: Sensation is reduced on left arm and leg.  Cerebellar: FNF with dysmetria on left.  Unable to complete HKS with left.    Labs   CBC:  Recent Labs  Lab 01/19/24 1514 01/19/24 1526  WBC 5.1  --   NEUTROABS 2.6  --   HGB 12.7 14.6  HCT 42.3 43.0  MCV 74.9*  --   PLT 395  --    Basic Metabolic Panel:  Lab Results  Component Value Date   NA 140 01/19/2024   K 3.3 (L) 01/19/2024   CO2 25 01/19/2024   GLUCOSE 101 (H) 01/19/2024   BUN 7 01/19/2024   CREATININE 0.90 01/19/2024   CALCIUM 9.4 01/19/2024   GFRNONAA >60 01/19/2024   GFRAA 113 05/27/2020   Lipid Panel:  Lab Results  Component Value Date   LDLCALC 98 05/27/2020   HgbA1c:  Lab Results  Component Value Date   HGBA1C 5.5 05/27/2020   HGBA1C 5.5 05/27/2020   HGBA1C 5.5 (A) 05/27/2020   HGBA1C 5.5 05/27/2020   Urine Drug Screen:     Component Value Date/Time   LABOPIA NONE DETECTED 01/19/2024 1601   COCAINSCRNUR NONE DETECTED 01/19/2024 1601   LABBENZ NONE DETECTED 01/19/2024 1601   AMPHETMU NONE  DETECTED 01/19/2024 1601   THCU NONE DETECTED 01/19/2024 1601   LABBARB NONE DETECTED 01/19/2024 1601    Alcohol Level     Component Value Date/Time   ETH <15 01/19/2024 1514   INR  Lab Results  Component Value Date   INR 1.1 01/19/2024   APTT  Lab Results  Component Value Date   APTT 34 01/19/2024    CT Head without contrast(Personally reviewed): No evidence of acute intracranial abnormality ASPECTS 10.  CT angio Head and Neck with contrast(Personally reviewed): No LVO  MRI Brain(Personally reviewed): pending  Assessment   Jackie Reed is a 58 y.o. female with PMH significant for HTN, Pre-DM, Migraines, Medication non-compliance who presented with sudden onset of left arm and leg weakness (she is left-hand dominant). NIH: 2 on tele-neurology assessment. Extremely hypertensive on arrival and c/o headache. CTH negative.  Given TNK @ 1523 and transferred to [facility] ICU.   Impression: Acute left-sided deficits, stroke suspected  Recommendations  - Frequent Neuro checks per stroke unit protocol - MRI Brain stroke protocol - 24hour post-TNK CT Head - TTE w/bubble study  - Lipid panel - Statin - will be started if LDL>70 or otherwise medically indicated - A1C - Antithrombotic - Hold until 24 hour post-TNK imaging is stable and negative for bleed. Patient was not on any antithrombotics or blood thinners PTA.  - DVT ppx - SCDs until 24hour post-TNK imaging is stable and negative for bleed.  - BP goal - <180/105 PRN labetalol if HR>60  and PRN Hydralazine if HR<60 Cleviprex gtt if needed - Telemetry monitoring for arrhythmia  - Swallow screen - will be performed prior to PO intake - Stroke education - will be given - PT/OT/SLP - Dispo: Admit to ICU  _____________________________________________________________   Signed, Audrene Lease, NP [facility]  I have seen the patient reviewed the above note.  She is on isoniazid , started in January of this year with  the plan to continue for 6 months.  She also has a history of sarcoidosis and it was in anticipation of treating this that the latent TB was found.  She presented with left-sided weakness and numbness and was evaluated by telepresence and had a IV TNK administered.  I will continue the isoniazid  for latent TB and inhaler for sarcoidosis.  She will receive post TNK care and evaluation for secondary stroke prevention.  Ann Keto, MD [facility]   If 7pm- 7am, please page neurology on call as listed in AMION.

## 2024-01-19 NOTE — ED Triage Notes (Signed)
 Pt has been non-compliant with blood pressure medications. Took dose last night and this AM, but has not been taking it like she should. BP has been 220 systolic at home today. C/o left sided weakness that started at 1100 today.

## 2024-01-19 NOTE — Progress Notes (Signed)
 Telestroke cart was activated at 1505. Per treatment team, pt's LKW was at 1100 with c/o L sided weakness and ataxia to L hand. Dr. Nora Beal, EDP already assessed pt prior to cart activation. Pt transported to CT at 1506 and returned to room at 1517. TSMD was paged for code stroke at 1506. Dr. Arora, TSMD appeared on telestroke cart at 1507 to assess the patient. Per Dr. Bonnita Buttner, TSMD, pt met criteria for TNK administration. TSMD explained the risks/benefits of TNK adminstration to pt at 1521. Pt indicated that they understood and consented to TNK administration at 1521. A time out was called at 1521. Pt's actual weight of 95.7kg was confirmed by primary RN. Pt was given a TNK bolus of 24mg /4.54ml by Franky Ivanoff, RN at [phone]. Pt was informed to notify treatment team of worsening of symptoms, headache, nausea, or signs of an allergic reaction. Pt indicated that they understood. Pt was transported to and from CT for advanced imaging. Dr. Arora was made aware. Results pending. Telestroke RN remained on telestroke cart with pt for post TNK adminstration monitoring. Pt remains on continuous cardiac, BP, and pulse ox monitoring for ongoing assessment. Telestroke RN completed post TNK monitoring of patient. NAD was noted at this time. Primary RN remains at bedside for ongoing monitoring. Pt pending transfer to [facility] for an ICU bed at this time. No further needs from [employer]. Telestroke cart was disconnected at 1601.     mRS-0

## 2024-01-19 NOTE — Consult Note (Signed)
 [facility] Telemedicine Consult   Requesting Provider: Dr Nora Beal Consult Participants: Dr. Anastasia Balo, Telespecialist RN [place]   Bedside RN Clare Critchley Location of the provider: [facility] Location of the patient: [facility]  This consult was provided via telemedicine with 2-way video and audio communication. The patient/family was informed that care would be provided in this way and agreed to receive care in this manner.   Chief Complaint:left arm and leg weakness of sudden onset  HPI: 57/F with HTN noncompliant to medications for the past few days, history of migraines, prediabetes, presented to the emerged department with sudden onset of left arm and leg weakness. She works from home and was trying to type something on her computer and trying to grab her pen-she is left-handed-when she noted that she could not operate the keyboard or use the pen without hand.  When she tried to stand up, her left leg gave out and she could not support weight on that left leg. Came into the emergency department for evaluation.  Noted to have left-sided weakness-within time window for TNK-code stroke was activated.  Reports of a headache for the past few days which has been off-and-on, had a bad headache yesterday but did not have any severe headaches this morning.  Does get migraines.  Never with these current symptoms.  Been extremely hypertensive-required 1 dose of labetalol 1 dose of hydralazine prior to TNK administration.  Blood pressure went out of goal after TNK administration again requiring additional dose of labetalol.  Cleviprex was also ordered and as the nursing staff was getting ready to start Cleviprex, the blood pressure came back into goal range.  Cleviprex will be at bedside    Past Medical History:  Diagnosis Date   Dyspnea    occasional   E-coli UTI 08/2019   Hepatitis 05/2020   Hepatitis B core antibody positive   Hypertension    Migraine    Plantar fibromatosis    PONV  (postoperative nausea and vomiting)    Pre-diabetes    Thyroid  nodule 01/29/2023   seen on PET scan   Vitamin B12 deficiency 06/2019   Vitamin D  deficiency 06/2019     Current Facility-Administered Medications:    clevidipine (CLEVIPREX) infusion 0.5 mg/mL, 0-21 mg/hr, Intravenous, Continuous, Malana Eberwein, MD   hydrALAZINE (APRESOLINE) injection 10 mg, 10 mg, Intravenous, Once, Tona Francis, MD   labetalol (NORMODYNE) 5 MG/ML injection, , , ,    labetalol (NORMODYNE) injection 10 mg, 10 mg, Intravenous, Once, Alliya Marcon, MD   labetalol (NORMODYNE) injection 10 mg, 10 mg, Intravenous, Once, Chay Mazzoni, MD   tenecteplase [facility]) injection for Stroke 24 mg, 0.25 mg/kg, Intravenous, Once, Tona Francis, MD  Current Outpatient Medications:    amLODipine  (NORVASC ) 5 MG tablet, Take 2 tablets (10 mg total) by mouth daily. (Patient not taking: Reported on 10/01/2023), Disp: 90 tablet, Rfl: 3   fluticasone  furoate-vilanterol (BREO ELLIPTA ) 100-25 MCG/ACT AEPB, Inhale 1 puff into the lungs daily., Disp: 30 each, Rfl: 5   hydrochlorothiazide (HYDRODIURIL) 25 MG tablet, Take by mouth., Disp: , Rfl:    ibuprofen  (ADVIL ) 800 MG tablet, Take 1 tablet (800 mg total) by mouth 3 (three) times daily., Disp: 21 tablet, Rfl: 0   isoniazid  (NYDRAZID ) 300 MG tablet, Take 1 tablet (300 mg total) by mouth daily., Disp: 30 tablet, Rfl: 8   losartan  (COZAAR ) 25 MG tablet, Take 1 tablet (25 mg total) by mouth daily. (Patient not taking: Reported on 10/01/2023), Disp: 90 tablet, Rfl: 3  promethazine -dextromethorphan (PROMETHAZINE -DM) 6.25-15 MG/5ML syrup, Take 5 mLs by mouth 4 (four) times daily as needed for cough. (Patient not taking: Reported on 10/01/2023), Disp: 180 mL, Rfl: 0   pyridOXINE  (B-6) 50 MG tablet, Take 1 tablet (50 mg total) by mouth daily. (Patient not taking: Reported on 10/01/2023), Disp: 30 tablet, Rfl: 8    LKW: 11 AM IV thrombolysis given?:  Yes IR Thrombectomy? No, clinically not  consistent with ELVO.  Vessel imaging pending. Modified Rankin Scale: 0-Completely asymptomatic and back to baseline post- stroke Time of teleneurologist evaluation: 1508 hrs.  Exam: Vitals:   01/19/24 1530 01/19/24 1535  BP: (!) 184/88 (!) 166/92  Pulse:    Resp: (!) 22 (!) 25  Temp:    SpO2: 100% 100%    Exam Awake alert oriented x 3, no dysarthria, no aphasia, cranial nerves II to XII intact.  Motor examination with left upper and lower extremity drift where the upper extremity drifts in a bobbing fashion, lower extremity also has a drift and somewhat of a bobbing fashion.  Right side is full strength.  Sensation is intact without extinction on both sides.  Coordination examination with mild dysmetria on the left-which is proportionate to the weakness.   NIHSS 1A: Level of Consciousness - 0 1B: Ask Month and Age - 0 1C: 'Blink Eyes' & 'Squeeze Hands' - 0 2: Test Horizontal Extraocular Movements - 0 3: Test Visual Fields - 0 4: Test Facial Palsy - 0 5A: Test Left Arm Motor Drift - 1 5B: Test Right Arm Motor Drift - 0 6A: Test Left Leg Motor Drift - 1 6B: Test Right Leg Motor Drift - 0 7: Test Limb Ataxia - 0 8: Test Sensation - 0 9: Test Language/Aphasia- 0 10: Test Dysarthria - 0 11: Test Extinction/Inattention - 0 NIHSS score: 2   Imaging Reviewed: CT head-aspects 10.  No bleed.  Labs reviewed in epic and pertinent values follow: CBC    Component Value Date/Time   WBC 3.7 07/28/2023 1017   WBC 6.2 01/20/2023 1424   WBC 5.3 01/18/2023 1030   RBC 5.51 (H) 07/28/2023 1017   RBC 5.51 (H) 01/20/2023 1424   HGB 14.6 01/19/2024 1526   HGB 12.0 07/28/2023 1017   HCT 43.0 01/19/2024 1526   HCT 40.9 07/28/2023 1017   PLT 385 07/28/2023 1017   MCV 74 (L) 07/28/2023 1017   MCH 21.8 (L) 07/28/2023 1017   MCH 22.5 (L) 01/20/2023 1424   MCHC 29.3 (L) 07/28/2023 1017   MCHC 31.6 01/20/2023 1424   RDW 15.3 07/28/2023 1017   LYMPHSABS 1.0 07/28/2023 1017   MONOABS 0.9  01/20/2023 1424   EOSABS 0.2 07/28/2023 1017   BASOSABS 0.0 07/28/2023 1017   CMP     Component Value Date/Time   NA 140 01/19/2024 1526   NA 139 07/28/2023 1017   K 3.3 (L) 01/19/2024 1526   CL 102 01/19/2024 1526   CO2 23 07/28/2023 1017   GLUCOSE 101 (H) 01/19/2024 1526   BUN 7 01/19/2024 1526   BUN 13 07/28/2023 1017   CREATININE 0.90 01/19/2024 1526   CREATININE 0.64 01/20/2023 1424   CALCIUM 9.4 07/28/2023 1017   PROT 7.8 10/01/2023 0903   PROT 7.9 07/28/2023 1017   ALBUMIN 4.1 07/28/2023 1017   AST 24 10/01/2023 0903   AST 35 01/20/2023 1424   ALT 14 10/01/2023 0903   ALT 18 01/20/2023 1424   ALKPHOS 207 (H) 07/28/2023 1017   BILITOT 0.4 10/01/2023 0903  BILITOT 0.5 07/28/2023 1017   BILITOT 0.7 01/20/2023 1424   GFRNONAA >60 01/20/2023 1424   GFRAA 113 05/27/2020 1211     Assessment: 59 year old woman with noncompliance to medication, history of migraines, prediabetes presenting to the emergency department with sudden onset of left arm and leg weakness that started while she was typing on her computer at her her job which she does from home. She is left-hand dominant NIH stroke scale 2 on exam-although mild from a stroke scale standpoint, for her being left-handed, this weakness is very disabling. Pure motor lacunar infarction is the highest on the differential given risk factors. Complex migraine as well as hypertensive urgency is also possible. Ideally I would have wanted to get an MRI to see if there is any evidence of DWI abnormality, but since we did not have enough time to obtain that, and the fact that she was running out of time to receive TNK-given the disabling nature,  discussions of risks, benefits and alternatives of IV TNKase were had with her over the camera.  She agreed to proceed. CT head was reviewed prior to TNK administration.  Impression: Acute left upper and lower extremity weakness-differentials include acute ischemic stroke.  Given mild  headache, complex migraine also possible.  Came in extremely hypertensive with a systolic in the 230s-hypertensive urgency also possible as the cause of the symptoms.  Recommendations:  Transfer to [facility] Admit to neurological ICU-Dr. Alecia Ames is the accepting physician Post TNK vitals and neurochecks-orders placed. No antiplatelets or anticoagulants for 24 hours-SCD for DVT prophylaxis CT angiogram head and neck-stat-ordered-will follow. MRI brain at 24 hours Stat CT head if there is any neurochange. 2D echo, A1c, lipid panel. Therapy assessments Correction of electrolyte derangements as needed. Neurohospitalist team to evaluate and proceed with the admission H&P and admission orders upon arrival to [facility] neuro ICU.  Plan was discussed with the patient over the camera, Dr. Nora Beal over the camera and Dr. Alecia Ames over secure chat.    TNK administration was delayed due to blood pressure being out of the goal range and requiring multiple medications to get it under the goal range.   -- Tona Francis, MD Neurologist [facility] Pager: [phone]  CRITICAL CARE ATTESTATION Performed by: Tona Francis, MD Total critical care time: 32 minutes Critical care time was exclusive of separately billable procedures and treating other patients and/or supervising APPs/Residents/Students Critical care was necessary to treat or prevent imminent or life-threatening deterioration. This patient is critically ill and at significant risk for neurological worsening and/or death and care requires constant monitoring. Critical care was time spent personally by me on the following activities: development of treatment plan with patient and/or surrogate as well as nursing, discussions with consultants, evaluation of patient's response to treatment, examination of patient, obtaining history from patient or surrogate, ordering and performing treatments and interventions,  ordering and review of laboratory studies, ordering and review of radiographic studies, pulse oximetry, re-evaluation of patient's condition, participation in multidisciplinary rounds and medical decision making of high complexity in the care of this patient.

## 2024-01-19 NOTE — ED Provider Notes (Signed)
 [place] EMERGENCY DEPARTMENT AT [facility] Provider Note   CSN: [id number] Arrival date & time: 01/19/24  1450     History  Chief Complaint  Patient presents with   Weakness    Jackie Reed is a 58 y.o. female.  Patient is a 58 year old female with a past medical history of hypertension with medication noncompliance and migraine headaches presenting to the emergency department with left-sided weakness.  Patient states that she was having a headache last night and noticed that her blood pressure was in the 220s systolic.  She states when she woke up this morning her headache had resolved but her blood pressure was still elevated.  She states around 11 AM this morning she had sudden onset of left-sided weakness involving her left arm and left leg.  She denies any numbness.  The history is provided by the patient.  Weakness      Home Medications Prior to Admission medications   Medication Sig Start Date End Date Taking? Authorizing Provider  amLODipine  (NORVASC ) 5 MG tablet Take 2 tablets (10 mg total) by mouth daily. Patient not taking: Reported on 10/01/2023 01/18/23   Scarlette Currier, MD  fluticasone  furoate-vilanterol (BREO ELLIPTA ) 100-25 MCG/ACT AEPB Inhale 1 puff into the lungs daily. 04/07/23   Wilfredo Hanly, MD  hydrochlorothiazide (HYDRODIURIL) 25 MG tablet Take by mouth. 09/09/23   [provider]  ibuprofen  (ADVIL ) 800 MG tablet Take 1 tablet (800 mg total) by mouth 3 (three) times daily. 12/30/22   Rising, Ivette Marks, PA-C  isoniazid  (NYDRAZID ) 300 MG tablet Take 1 tablet (300 mg total) by mouth daily. 09/01/23   Lina Render, MD  losartan  (COZAAR ) 25 MG tablet Take 1 tablet (25 mg total) by mouth daily. Patient not taking: Reported on 10/01/2023 10/06/21   Almond Army, MD  promethazine -dextromethorphan (PROMETHAZINE -DM) 6.25-15 MG/5ML syrup Take 5 mLs by mouth 4 (four) times daily as needed for cough. Patient not taking: Reported on 10/01/2023  12/30/22   Rising, Ivette Marks, PA-C  pyridOXINE  (B-6) 50 MG tablet Take 1 tablet (50 mg total) by mouth daily. Patient not taking: Reported on 10/01/2023 09/01/23   Lina Render, MD      Allergies    Amoxicillin , Morphine  and codeine, and Tramadol     Review of Systems   Review of Systems  Neurological:  Positive for weakness.    Physical Exam Updated Vital Signs BP (!) 231/124 (BP Location: Right Arm)   Pulse 80   Temp 98.1 F (36.7 C) (Oral)   Resp 16   Ht 5\' 4"  (1.626 m)   Wt 95.7 kg   SpO2 100%   BMI 36.21 kg/m  Physical Exam Vitals and nursing note reviewed.  Constitutional:      General: She is not in acute distress.    Appearance: Normal appearance.  HENT:     Head: Normocephalic and atraumatic.     Nose: Nose normal.     Mouth/Throat:     Mouth: Mucous membranes are moist.     Pharynx: Oropharynx is clear.  Eyes:     Extraocular Movements: Extraocular movements intact.     Conjunctiva/sclera: Conjunctivae normal.     Pupils: Pupils are equal, round, and reactive to light.  Cardiovascular:     Rate and Rhythm: Normal rate.  Pulmonary:     Effort: Pulmonary effort is normal.  Abdominal:     General: Abdomen is flat.  Musculoskeletal:        General: Normal range of motion.  Cervical back: Normal range of motion.  Skin:    General: Skin is warm and dry.  Neurological:     Mental Status: She is alert and oriented to person, place, and time.     Cranial Nerves: No cranial nerve deficit.     Sensory: No sensory deficit.     Motor: Weakness (Mild drift in LUE/LLE, 4/5 grip strength in LUE) present.     Coordination: Coordination abnormal (LUE in finger to nose).     Comments: Normal speech  Psychiatric:        Mood and Affect: Mood normal.        Behavior: Behavior normal.     ED Results / Procedures / Treatments   Labs (all labs ordered are listed, but only abnormal results are displayed) Labs Reviewed  I-STAT CHEM 8, ED - Abnormal; Notable for the  following components:      Result Value   Potassium 3.3 (*)    Glucose, Bld 101 (*)    All other components within normal limits  ETHANOL  PROTIME-INR  APTT  CBC  DIFFERENTIAL  COMPREHENSIVE METABOLIC PANEL WITH GFR  RAPID URINE DRUG SCREEN, HOSP PERFORMED  CBG MONITORING, ED    EKG None  Radiology CT HEAD CODE STROKE WO CONTRAST Result Date: 01/19/2024 CLINICAL DATA:  Code stroke.  Neuro deficit, left-sided weakness. EXAM: CT HEAD WITHOUT CONTRAST TECHNIQUE: Contiguous axial images were obtained from the base of the skull through the vertex without intravenous contrast. RADIATION DOSE REDUCTION: This exam was performed according to the departmental dose-optimization program which includes automated exposure control, adjustment of the mA and/or kV according to patient size and/or use of iterative reconstruction technique. COMPARISON:  MRI head 01/18/2023. FINDINGS: Brain: No acute intracranial hemorrhage. No CT evidence of acute infarct. Nonspecific hypoattenuation in the periventricular and subcortical white matter favored to reflect chronic microvascular ischemic changes. No edema, mass effect, or midline shift. The basilar cisterns are patent. Ventricles: The ventricles are normal. Vascular: No hyperdense vessel or unexpected calcification. Skull: No acute or aggressive finding. Orbits: Orbits are symmetric. Sinuses: The visualized paranasal sinuses are clear. Other: Mastoid air cells are clear. ASPECTS [facility] Stroke Program Early CT Score) - Ganglionic level infarction (caudate, lentiform nuclei, internal capsule, insula, M1-M3 cortex): 7 - Supraganglionic infarction (M4-M6 cortex): 3 Total score (0-10 with 10 being normal): 10 IMPRESSION: 1. No CT evidence of acute intracranial abnormality. 2. Mild chronic microvascular ischemic changes. 3. ASPECTS is 10 These results were communicated to Dr. Arora at 3:21 pm on 01/19/2024 by text page via the [facility] messaging system. Electronically Signed    By: Denny Flack M.D.   On: 01/19/2024 15:21    Procedures .Critical Care  Performed by: Kingsley, Caspar Favila K, DO Authorized by: Nolberto Batty, DO   Critical care provider statement:    Critical care time (minutes):  30   Critical care was necessary to treat or prevent imminent or life-threatening deterioration of the following conditions:  CNS failure or compromise   Critical care was time spent personally by me on the following activities:  Development of treatment plan with patient or surrogate, discussions with consultants, evaluation of patient's response to treatment, examination of patient, ordering and review of laboratory studies, ordering and review of radiographic studies, ordering and performing treatments and interventions, pulse oximetry, re-evaluation of patient's condition and review of old charts     Medications Ordered in ED Medications  labetalol (NORMODYNE) injection 10 mg (has no administration in time range)  hydrALAZINE (APRESOLINE) injection 10 mg (has no administration in time range)  tenecteplase (TNKASE) injection for Stroke 24 mg (has no administration in time range)  labetalol (NORMODYNE) 5 MG/ML injection (has no administration in time range)  clevidipine (CLEVIPREX) infusion 0.5 mg/mL (has no administration in time range)  labetalol (NORMODYNE) injection 10 mg (has no administration in time range)  ondansetron  (ZOFRAN ) injection 4 mg (4 mg Intravenous Given 01/19/24 1538)    ED Course/ Medical Decision Making/ A&P                                 Medical Decision Making This patient presents to the ED with chief complaint(s) of L-sided weakness with pertinent past medical history of HTN with med noncompliance, migraines which further complicates the presenting complaint. The complaint involves an extensive differential diagnosis and also carries with it a high risk of complications and morbidity.    The differential diagnosis includes CVA, TIA,  hypertensive emergency, ICH, mass effect, complex migraine, hypo or hyperglycemia, electrolyte abnormality  Additional history obtained: Additional history obtained from N/A Records reviewed outpatient pulm and ID records  ED Course and Reassessment: On patient's arrival she was hypertensive and otherwise hemodynamically stable in no acute distress. I was initially called by RN in triage to evaluate the patient for possible code stroke with concern of new left-sided weakness with last known well at 11 AM.  I immediately evaluated the patient and she was notable to have 4-5 strength in left upper extremity with drift in left upper and lower extremity.  Patient is within the TNK window and code stroke was called and she was immediately transferred to CT scanner and teleneurology was paged.  CT head showed no acute abnormality.  She was evaluated by Dr. Marigny Reed who determined that she is a TNK candidate.  She was given labetalol for BP control prior to receiving TNK.  Plan will be admission to the neuro ICU. Patient will be started on Cleviprex drip if needed for BP goal.  Independent labs interpretation:  The following labs were independently interpreted: glucose and istat within normal range  Independent visualization of imaging: - I independently visualized the following imaging with scope of interpretation limited to determining acute life threatening conditions related to emergency care: CTH, which revealed no acute disease  Consultation: - Consulted or discussed management/test interpretation w/ external professional: neurology  Consideration for admission or further workup: patient requires admission for acute stroke Social Determinants of health: N/A    Amount and/or Complexity of Data Reviewed Labs: ordered. Radiology: ordered.  Risk Prescription drug management. Decision regarding hospitalization.          Final Clinical Impression(s) / ED Diagnoses Final diagnoses:   Cerebrovascular accident (CVA), unspecified mechanism [facility])    Rx / DC Orders ED Discharge Orders     None         Kingsley, Hau Sanor K, DO 01/19/24 1538

## 2024-01-19 NOTE — ED Notes (Signed)
 Code stroke called by Fairy Homer,  Verbal given. Operator notified

## 2024-01-19 NOTE — Progress Notes (Addendum)
 PHARMACIST CODE STROKE RESPONSE  Notified to mix TNK at approximately 1515 by Dr. Bonnita Buttner TNK preparation completed at 1520  TNK dose = 24 mg IV over 5 seconds  Issues/delays encountered (if applicable): BP elevated on presentation. Pt received PRN hydralazine, labetalol. Initial improvement in BP to appropriate parameters for TNK administration. TNK administered. BP elevated above goal, given an additional dose of labetalol. Prior to initiating clevidipine, BP back in goal - remains at bedside as needed.   Lolita Rise, PharmD, BCPS Clinical Pharmacist 01/19/2024 3:40 PM

## 2024-01-20 ENCOUNTER — Inpatient Hospital Stay (HOSPITAL_COMMUNITY)

## 2024-01-20 DIAGNOSIS — I10 Essential (primary) hypertension: Secondary | ICD-10-CM | POA: Diagnosis not present

## 2024-01-20 DIAGNOSIS — I674 Hypertensive encephalopathy: Secondary | ICD-10-CM | POA: Diagnosis not present

## 2024-01-20 DIAGNOSIS — R2990 Unspecified symptoms and signs involving the nervous system: Secondary | ICD-10-CM | POA: Diagnosis not present

## 2024-01-20 DIAGNOSIS — G43909 Migraine, unspecified, not intractable, without status migrainosus: Secondary | ICD-10-CM | POA: Diagnosis not present

## 2024-01-20 DIAGNOSIS — F449 Dissociative and conversion disorder, unspecified: Secondary | ICD-10-CM

## 2024-01-20 DIAGNOSIS — E785 Hyperlipidemia, unspecified: Secondary | ICD-10-CM

## 2024-01-20 LAB — BASIC METABOLIC PANEL WITH GFR
Anion gap: 8 (ref 5–15)
BUN: 11 mg/dL (ref 6–20)
CO2: 24 mmol/L (ref 22–32)
Calcium: 9 mg/dL (ref 8.9–10.3)
Chloride: 106 mmol/L (ref 98–111)
Creatinine, Ser: 0.78 mg/dL (ref 0.44–1.00)
GFR, Estimated: >60 mL/min (ref >60)
Glucose, Bld: 100 mg/dL — ABNORMAL HIGH (ref 70–99)
Potassium: 3.7 mmol/L (ref 3.5–5.1)
Sodium: 138 mmol/L (ref 135–145)

## 2024-01-20 LAB — CBC
HCT: 37.8 % (ref 36.0–46.0)
Hemoglobin: 11.8 g/dL — ABNORMAL LOW (ref 12.0–15.0)
MCH: 22.3 pg — ABNORMAL LOW (ref 26.0–34.0)
MCHC: 31.2 g/dL (ref 30.0–36.0)
MCV: 71.3 fL — ABNORMAL LOW (ref 80.0–100.0)
Platelets: 368 10*3/uL (ref 150–400)
RBC: 5.3 MIL/uL — ABNORMAL HIGH (ref 3.87–5.11)
RDW: 15.8 % — ABNORMAL HIGH (ref 11.5–15.5)
WBC: 4.5 10*3/uL (ref 4.0–10.5)
nRBC: 0 % (ref 0.0–0.2)

## 2024-01-20 LAB — HEMOGLOBIN A1C
Hgb A1c MFr Bld: 5.2 % (ref 4.8–5.6)
Mean Plasma Glucose: 102.54 mg/dL

## 2024-01-20 LAB — LIPID PANEL
Cholesterol: 155 mg/dL (ref 0–200)
HDL: 43 mg/dL (ref >40)
LDL Cholesterol: 84 mg/dL (ref 0–99)
Total CHOL/HDL Ratio: 3.6 ratio
Triglycerides: 139 mg/dL (ref <150)
VLDL: 28 mg/dL (ref 0–40)

## 2024-01-20 LAB — ECHOCARDIOGRAM COMPLETE
Area-P 1/2: 3.65 cm2
Height: 64 in
S' Lateral: 2.9 cm
Weight: 3513.25 [oz_av]

## 2024-01-20 LAB — MAGNESIUM: Magnesium: 2.4 mg/dL (ref 1.7–2.4)

## 2024-01-20 MED ORDER — AMLODIPINE BESYLATE 5 MG PO TABS
10.0000 mg | ORAL_TABLET | Freq: Every day | ORAL | Status: DC
  Administered 2024-01-20 – 2024-01-28 (×9): 10 mg via ORAL
  Filled 2024-01-20 (×7): qty 2
  Filled 2024-01-20: qty 1
  Filled 2024-01-20: qty 2

## 2024-01-20 MED ORDER — HYDROCHLOROTHIAZIDE 25 MG PO TABS
25.0000 mg | ORAL_TABLET | Freq: Every day | ORAL | Status: DC
  Administered 2024-01-20 – 2024-01-28 (×9): 25 mg via ORAL
  Filled 2024-01-20 (×9): qty 1

## 2024-01-20 MED ORDER — PERFLUTREN LIPID MICROSPHERE
1.0000 mL | INTRAVENOUS | Status: AC | PRN
  Administered 2024-01-20: 3 mL via INTRAVENOUS

## 2024-01-20 MED ORDER — STROKE: EARLY STAGES OF RECOVERY BOOK
Freq: Once | Status: AC
  Filled 2024-01-20: qty 1

## 2024-01-20 MED ORDER — ACETAMINOPHEN 325 MG PO TABS
975.0000 mg | ORAL_TABLET | Freq: Three times a day (TID) | ORAL | Status: DC | PRN
  Administered 2024-01-20 – 2024-01-28 (×13): 975 mg via ORAL
  Filled 2024-01-20 (×13): qty 3

## 2024-01-20 MED ORDER — ROSUVASTATIN CALCIUM 20 MG PO TABS
20.0000 mg | ORAL_TABLET | Freq: Every day | ORAL | Status: DC
  Administered 2024-01-20 – 2024-01-28 (×9): 20 mg via ORAL
  Filled 2024-01-20 (×9): qty 1

## 2024-01-20 NOTE — Care Management (Signed)
 Transition of Care [facility]) - Inpatient Brief Assessment   Patient Details  Name: IAN CAVEY MRN: [id number] Date of Birth: 07/13/1966  Transition of [facility]) CM/SW Contact:    Ronni Colace, RN Phone Number: 01/20/2024, 11:29 AM   Clinical Narrative: Presented left side weakness, Has some noncompliance with BP medications, hypertensive upon presentation. Is being treated for latent TB. Received TNK, monitoring in ICU. Have not had evaluations by PT OT, not ordered at present No needs identified, The patient  will be discussed in daily progressive rounds, if a need is identified, please place a TOC consult   Transition of Care Asessment:   Patient has primary care physician: Yes Home environment has been reviewed: SIngle  swelling Prior level of function:: Indepndent Prior/Current Home Services: No current home services Social Drivers of Health Review: SDOH reviewed no interventions necessary Readmission risk has been reviewed: Yes Transition of care needs: no transition of care needs at this time

## 2024-01-20 NOTE — TOC CAGE-AID Note (Signed)
 Transition of Care [facility]) - CAGE-AID Screening   Patient Details  Name: Jackie Reed MRN: [id number] Date of Birth: 28-Jun-1966  Transition of Care [facility]) CM/SW Contact:    Neelie Welshans E Arneta Mahmood, LCSW Phone Number: 01/20/2024, 8:58 AM   Clinical Narrative:    CAGE-AID Screening:    Have You Ever Felt You Ought to Cut Down on Your Drinking or Drug Use?: No Have People Annoyed You By [employer] Your Drinking Or Drug Use?: No Have You Felt Bad Or Guilty About Your Drinking Or Drug Use?: No Have You Ever Had a Drink or Used Drugs First Thing In The Morning to Steady Your Nerves or to Get Rid of a Hangover?: No CAGE-AID Score: 0  Substance Abuse Education Offered: No

## 2024-01-20 NOTE — Progress Notes (Addendum)
 STROKE TEAM PROGRESS NOTE    SIGNIFICANT HOSPITAL EVENTS 5/28-patient admitted and given TNK for left-sided weakness  INTERIM HISTORY/SUBJECTIVE Patient has remained hemodynamically stable and afebrile overnight.  MRI was negative for acute infarct, but some left-sided weakness remains.  Awaiting PT evaluation.  OBJECTIVE  CBC    Component Value Date/Time   WBC 4.5 01/20/2024 0502   RBC 5.30 (H) 01/20/2024 0502   HGB 11.8 (L) 01/20/2024 0502   HGB 12.0 07/28/2023 1017   HCT 37.8 01/20/2024 0502   HCT 40.9 07/28/2023 1017   PLT 368 01/20/2024 0502   PLT 385 07/28/2023 1017   MCV 71.3 (L) 01/20/2024 0502   MCV 74 (L) 07/28/2023 1017   MCH 22.3 (L) 01/20/2024 0502   MCHC 31.2 01/20/2024 0502   RDW 15.8 (H) 01/20/2024 0502   RDW 15.3 07/28/2023 1017   LYMPHSABS 1.4 01/19/2024 1514   LYMPHSABS 1.0 07/28/2023 1017   MONOABS 0.8 01/19/2024 1514   EOSABS 0.3 01/19/2024 1514   EOSABS 0.2 07/28/2023 1017   BASOSABS 0.0 01/19/2024 1514   BASOSABS 0.0 07/28/2023 1017    BMET    Component Value Date/Time   NA 138 01/20/2024 0502   NA 139 07/28/2023 1017   K 3.7 01/20/2024 0502   CL 106 01/20/2024 0502   CO2 24 01/20/2024 0502   GLUCOSE 100 (H) 01/20/2024 0502   BUN 11 01/20/2024 0502   BUN 13 07/28/2023 1017   CREATININE 0.78 01/20/2024 0502   CREATININE 0.64 01/20/2023 1424   CALCIUM 9.0 01/20/2024 0502   EGFR 92 07/28/2023 1017   GFRNONAA >60 01/20/2024 0502   GFRNONAA >60 01/20/2023 1424    IMAGING past 24 hours MR BRAIN WO CONTRAST Result Date: 01/20/2024 CLINICAL DATA:  Stroke follow-up EXAM: MRI HEAD WITHOUT CONTRAST TECHNIQUE: Multiplanar, multiecho pulse sequences of the brain and surrounding structures were obtained without intravenous contrast. COMPARISON:  01/18/2023 FINDINGS: Brain: No acute infarct, mass effect or extra-axial collection. No acute or chronic hemorrhage. There is multifocal hyperintense T2-weighted signal within the white matter. Generalized  volume loss. The midline structures are normal. Vascular: Normal flow voids. Skull and upper cervical spine: Normal calvarium and skull base. Visualized upper cervical spine and soft tissues are normal. Sinuses/Orbits:No paranasal sinus fluid levels or advanced mucosal thickening. No mastoid or middle ear effusion. Normal orbits. IMPRESSION: 1. No acute intracranial abnormality. 2. Findings of chronic small vessel ischemia and volume loss. Electronically Signed   By: Juanetta Nordmann M.D.   On: 01/20/2024 01:30   CT ANGIO HEAD NECK W WO CM (CODE STROKE) Result Date: 01/19/2024 CLINICAL DATA:  Stroke, follow up.  Left-sided weakness. EXAM: CT ANGIOGRAPHY HEAD AND NECK WITH AND WITHOUT CONTRAST TECHNIQUE: Multidetector CT imaging of the head and neck was performed using the standard protocol during bolus administration of intravenous contrast. Multiplanar CT image reconstructions and MIPs were obtained to evaluate the vascular anatomy. Carotid stenosis measurements (when applicable) are obtained utilizing NASCET criteria, using the distal internal carotid diameter as the denominator. RADIATION DOSE REDUCTION: This exam was performed according to the departmental dose-optimization program which includes automated exposure control, adjustment of the mA and/or kV according to patient size and/or use of iterative reconstruction technique. CONTRAST:  75mL OMNIPAQUE  IOHEXOL  350 MG/ML SOLN COMPARISON:  Head CT earlier today and MRI 01/18/2023 FINDINGS: CT HEAD FINDINGS Brain: There is no evidence of an acute infarct, intracranial hemorrhage, mass, midline shift, or extra-axial fluid collection. Cerebral volume is within normal limits. The ventricles are normal in size.  Cerebral white matter hypodensities are unchanged and nonspecific but compatible with mild chronic small vessel ischemic disease. Vascular: No hyperdense vessel. Skull: No fracture or suspicious lesion. Sinuses/Orbits: Small mucous retention cyst in the left  maxillary sinus. Clear mastoid air cells. Unremarkable orbits. Other: None. Review of the MIP images confirms the above findings CTA NECK FINDINGS Aortic arch: Standard branching. Widely patent brachiocephalic and subclavian arteries. Right carotid system: Patent without evidence of stenosis, dissection, or significant atherosclerosis. Left carotid system: Patent without evidence of stenosis, dissection, or significant atherosclerosis. Vertebral arteries: Patent and codominant without evidence of stenosis, dissection, or significant atherosclerosis. Skeleton: No acute osseous abnormality or suspicious lesion. Other neck: No evidence of cervical lymphadenopathy or mass. Upper chest: Clear lung apices. Review of the MIP images confirms the above findings CTA HEAD FINDINGS Anterior circulation: The internal carotid arteries are widely patent from skull base to carotid termini. ACAs and MCAs are patent without evidence of a proximal branch occlusion or significant proximal stenosis. No aneurysm is identified. Posterior circulation: The intracranial vertebral arteries are widely patent to the basilar. Patent AICA and SCA origins are visualized bilaterally. The basilar artery is widely patent. There is a large right posterior communicating artery with hypoplasia of the right P1 segment. Both PCAs are patent without evidence of a significant proximal stenosis. No aneurysm is identified. Venous sinuses: As permitted by contrast timing, patent. Anatomic variants: Significant fetal type supply of the right PCA. Review of the MIP images confirms the above findings These results were communicated to Dr. Arora at 4:04 pm on 01/19/2024 by text page via the [facility] messaging system. IMPRESSION: 1. No evidence of acute intracranial abnormality. 2. No large vessel occlusion or significant stenosis in the head and neck. Electronically Signed   By: Aundra Lee M.D.   On: 01/19/2024 16:10   CT HEAD CODE STROKE WO CONTRAST Result Date:  01/19/2024 CLINICAL DATA:  Code stroke.  Neuro deficit, left-sided weakness. EXAM: CT HEAD WITHOUT CONTRAST TECHNIQUE: Contiguous axial images were obtained from the base of the skull through the vertex without intravenous contrast. RADIATION DOSE REDUCTION: This exam was performed according to the departmental dose-optimization program which includes automated exposure control, adjustment of the mA and/or kV according to patient size and/or use of iterative reconstruction technique. COMPARISON:  MRI head 01/18/2023. FINDINGS: Brain: No acute intracranial hemorrhage. No CT evidence of acute infarct. Nonspecific hypoattenuation in the periventricular and subcortical white matter favored to reflect chronic microvascular ischemic changes. No edema, mass effect, or midline shift. The basilar cisterns are patent. Ventricles: The ventricles are normal. Vascular: No hyperdense vessel or unexpected calcification. Skull: No acute or aggressive finding. Orbits: Orbits are symmetric. Sinuses: The visualized paranasal sinuses are clear. Other: Mastoid air cells are clear. ASPECTS [facility] Stroke Program Early CT Score) - Ganglionic level infarction (caudate, lentiform nuclei, internal capsule, insula, M1-M3 cortex): 7 - Supraganglionic infarction (M4-M6 cortex): 3 Total score (0-10 with 10 being normal): 10 IMPRESSION: 1. No CT evidence of acute intracranial abnormality. 2. Mild chronic microvascular ischemic changes. 3. ASPECTS is 10 These results were communicated to Dr. Arora at 3:21 pm on 01/19/2024 by text page via the [facility] messaging system. Electronically Signed   By: Denny Flack M.D.   On: 01/19/2024 15:21    Vitals:   01/20/24 0800 01/20/24 0900 01/20/24 1000 01/20/24 1153  BP: 134/82 (!) 152/82 (!) 166/78   Pulse: 87 78 73   Resp: 19 17 16    Temp:    98.5 F (36.9  C)  TempSrc:    Oral  SpO2: 95% 94% 97%   Weight:      Height:         PHYSICAL EXAM General:  Alert, well-nourished, well-developed  patient in no acute distress Psych:  Mood and affect appropriate for situation CV: Regular rate and rhythm on monitor Respiratory:  Regular, unlabored respirations on room air GI: Abdomen soft and nontender   NEURO:  Mental Status: AA&Ox3, patient is able to give clear and coherent history Speech/Language: speech is without dysarthria or aphasia.    Cranial Nerves:  II: PERRL. Visual fields full.  III, IV, VI: EOMI. Eyelids elevate symmetrically.  V: Sensation is intact to light touch and symmetrical to face.  VII: Face is symmetrical resting and smiling VIII: hearing intact to voice. IX, X: Palate elevates symmetrically. Phonation is normal.  VW:[id number] shrug weaker on the left XII: tongue is midline without fasciculations. Motor: 5/5 strength to right upper and lower extremities, 4/5 strength to left upper extremity and 3/5 strength to left lower extremity Tone: is normal and bulk is normal Sensation- Intact to light touch bilaterally.  Coordination: FTN intact on the right, some ataxia on the left Gait- deferred  Most Recent NIH  1a Level of Conscious.: 0 1b LOC Questions: 0 1c LOC Commands: 0 2 Best Gaze: 0 3 Visual: 0 4 Facial Palsy: 0 5a Motor Arm - left: 1 5b Motor Arm - Right: 0 6a Motor Leg - Left: 2 6b Motor Leg - Right: 0 7 Limb Ataxia: 1 8 Sensory: 0 9 Best Language: 0 10 Dysarthria: 0 11 Extinct. and Inatten.: 0 TOTAL: 4   ASSESSMENT/PLAN  Ms. SHANAYAH KAFFENBERGER is a 58 y.o. female with history of, sarcoidosis, latent TB, migraines and prediabetes admitted for acute onset left arm and leg weakness.  Patient received TNK at [facility] with some delays in administration due to hypertension and was transferred here for post TNK care.  MRI was negative for acute infarct, but some left-sided weakness remains.  NIH on Admission 5  Strokelike episode status post TNK Concerning for hypertensive encephalopathy versus conversion disorder Patient had significant left  upper and lower extremity giveaway weakness and positive Hoover signs Code Stroke CT head No acute abnormality. Small vessel disease. ASPECTS 10.    CTA head & neck no LVO or hemodynamically significant stenosis MRI no acute abnormality, chronic small vessel ischemic disease and volume loss CT at 24 hours pending 2D Echo EF 60 to 65% LDL 84 HgbA1c 5.5 UDS negative VTE prophylaxis -SCDs No antithrombotic prior to admission, now on No antithrombotic as she is less than 24 hours from TNK administration Therapy recommendations:  Pending Disposition: Pending  Hypertensive encephalopathy Home meds: Hydrochlorothiazide  25 mg daily Extremely high BP on presentation Currently stable on high end Resume home HCTZ 25 At amlodipine  10 Blood Pressure Goal: BP less than 180/105  Long-term BP goal normotensive  Hyperlipidemia Home meds: None LDL 84, goal < 70 Add rosuvastatin  20 mg daily Continue statin at discharge  Other Stroke Risk Factors Obesity, Body mass index is 37.69 kg/m., BMI >/= 30 associated with increased stroke risk, recommend weight loss, diet and exercise as appropriate  Migraine  Other Active Problems Latent TB-continue home isoniazid  and pyridoxine  (of note, patient was taking this medication as needed at home, will need counseling on proper administration of this medication) Sarcoidosis, follow-up with pulmonary, not on steroids  Hospital day # 1  Patient seen by NP with MD, MD to  edit note as needed. Cortney E Bucky Cardinal , MSN, AGACNP-BC [facility] See Amion for schedule and pager information 01/20/2024 12:26 PM   ATTENDING NOTE: I reviewed above note and agree with the assessment and plan. Pt was seen and examined.   No family at bedside.  Patient still complaining of left-sided weakness although MRI negative.  On exam showed significant left-sided giveaway weakness and positive Hoover sign.  Educated on distress and BP management.  Pending PT and  OT.  BP still on the higher end, resume home HCTZ and add amlodipine .  Also put on Crestor 20.  CT 24 hours post TNK pending.  For detailed assessment and plan, please refer to above as I have made changes wherever appropriate.   Consuelo Denmark, MD PhD Stroke Neurology 01/20/2024 6:53 PM  This patient is critically ill due to strokelike symptoms status post TNK and at significant risk of neurological worsening, death form bleeding from TNK. This patient's care requires constant monitoring of vital signs, hemodynamics, respiratory and cardiac monitoring, review of multiple databases, neurological assessment, discussion with family, other specialists and medical decision making of high complexity. I spent 35 minutes of neurocritical care time in the care of this patient.    To contact Stroke Continuity provider, please refer to [email or web address]. After hours, contact General Neurology

## 2024-01-20 NOTE — Progress Notes (Signed)
   01/20/24 1000  Spiritual Encounters  Type of Visit Initial  Care provided to: Patient  Reason for visit Routine spiritual support  OnCall Visit No    While rounding, chaplain visited the patient Jackie Reed. She stated that while she was working at home she felt numbness on her left side. She went to [facility] and then she was transferred to [facility]. She had a slight stroke. She mentioned that she had never experienced anything like that before.  She said that her husband was with her yesterday, came to [facility] and brought her a stuffed animal. Chaplain was present, listened to her attentively, and provided support. Chaplain will be available if needed.  M.Kubra Welby Hale Resident [phone]

## 2024-01-21 DIAGNOSIS — R2990 Unspecified symptoms and signs involving the nervous system: Secondary | ICD-10-CM | POA: Diagnosis not present

## 2024-01-21 DIAGNOSIS — F449 Dissociative and conversion disorder, unspecified: Secondary | ICD-10-CM | POA: Diagnosis not present

## 2024-01-21 DIAGNOSIS — G43909 Migraine, unspecified, not intractable, without status migrainosus: Secondary | ICD-10-CM | POA: Diagnosis not present

## 2024-01-21 DIAGNOSIS — I674 Hypertensive encephalopathy: Secondary | ICD-10-CM | POA: Diagnosis not present

## 2024-01-21 MED ORDER — ASPIRIN 81 MG PO TBEC
81.0000 mg | DELAYED_RELEASE_TABLET | Freq: Every day | ORAL | Status: DC
  Administered 2024-01-21 – 2024-01-28 (×8): 81 mg via ORAL
  Filled 2024-01-21 (×9): qty 1

## 2024-01-21 NOTE — Progress Notes (Signed)
 Inpatient Rehab Admissions Coordinator:   Per therapy recommendations pt was screened for CIR by Loye Rumble, PT, DPT.  Discussed with Dr. Rachel Budds who feels we may could pursue with diagnosis of hypertensive encephalopathy.  I will place a consult order per our protocol.    Loye Rumble, PT, DPT Admissions Coordinator [phone] 01/21/24  12:25 PM

## 2024-01-21 NOTE — Progress Notes (Addendum)
 STROKE TEAM PROGRESS NOTE    SIGNIFICANT HOSPITAL EVENTS 5/28-patient admitted and given TNK for left-sided weakness  INTERIM HISTORY/SUBJECTIVE Patient has remained hemodynamically stable and afebrile overnight.  MRI was negative for acute infarct, but some left-sided giveaway weakness remains.  PT recommending CIR  OBJECTIVE  CBC    Component Value Date/Time   WBC 4.5 01/20/2024 0502   RBC 5.30 (H) 01/20/2024 0502   HGB 11.8 (L) 01/20/2024 0502   HGB 12.0 07/28/2023 1017   HCT 37.8 01/20/2024 0502   HCT 40.9 07/28/2023 1017   PLT 368 01/20/2024 0502   PLT 385 07/28/2023 1017   MCV 71.3 (L) 01/20/2024 0502   MCV 74 (L) 07/28/2023 1017   MCH 22.3 (L) 01/20/2024 0502   MCHC 31.2 01/20/2024 0502   RDW 15.8 (H) 01/20/2024 0502   RDW 15.3 07/28/2023 1017   LYMPHSABS 1.4 01/19/2024 1514   LYMPHSABS 1.0 07/28/2023 1017   MONOABS 0.8 01/19/2024 1514   EOSABS 0.3 01/19/2024 1514   EOSABS 0.2 07/28/2023 1017   BASOSABS 0.0 01/19/2024 1514   BASOSABS 0.0 07/28/2023 1017    BMET    Component Value Date/Time   NA 138 01/20/2024 0502   NA 139 07/28/2023 1017   K 3.7 01/20/2024 0502   CL 106 01/20/2024 0502   CO2 24 01/20/2024 0502   GLUCOSE 100 (H) 01/20/2024 0502   BUN 11 01/20/2024 0502   BUN 13 07/28/2023 1017   CREATININE 0.78 01/20/2024 0502   CREATININE 0.64 01/20/2023 1424   CALCIUM 9.0 01/20/2024 0502   EGFR 92 07/28/2023 1017   GFRNONAA >60 01/20/2024 0502   GFRNONAA >60 01/20/2023 1424    IMAGING past 24 hours CT HEAD WO CONTRAST ( ) Result Date: 01/20/2024 CLINICAL DATA:  Stroke follow-up 24 hours after tPA administration EXAM: CT HEAD WITHOUT CONTRAST TECHNIQUE: Contiguous axial images were obtained from the base of the skull through the vertex without intravenous contrast. RADIATION DOSE REDUCTION: This exam was performed according to the departmental dose-optimization program which includes automated exposure control, adjustment of the mA and/or kV  according to patient size and/or use of iterative reconstruction technique. COMPARISON:  01/19/2024 FINDINGS: Brain: There is no mass, hemorrhage or extra-axial collection. There is generalized atrophy without lobar predilection. Hypodensity of the white matter is most commonly associated with chronic microvascular disease. Vascular: No hyperdense vessel or unexpected vascular calcification. Skull: The visualized skull base, calvarium and extracranial soft tissues are normal. Sinuses/Orbits: No fluid levels or advanced mucosal thickening of the visualized paranasal sinuses. No mastoid or middle ear effusion. Normal orbits. Other: None. IMPRESSION: 1. No acute intracranial abnormality. 2. Generalized atrophy and findings of chronic microvascular disease. Electronically Signed   By: Juanetta Nordmann M.D.   On: 01/20/2024 20:13   ECHOCARDIOGRAM COMPLETE Result Date: 01/20/2024    ECHOCARDIOGRAM REPORT   Patient Name:   Jackie Reed Date of Exam: 01/20/2024 Medical Rec #:  [id number]     Height:       64.0 in Accession #:    [id number]    Weight:       219.6 lb Date of Birth:  05-14-1966     BSA:          2.036 m Patient Age:    58 years      BP:           142/83 mmHg Patient Gender: F             HR:  71 bpm. Exam Location:  Inpatient Procedure: 2D Echo, Cardiac Doppler, Color Doppler and Intracardiac            Opacification Agent (Both Spectral and Color Flow Doppler were            utilized during procedure). Indications:    Stroke  History:        Patient has no prior history of Echocardiogram examinations.                 Risk Factors:Hypertension.  Sonographer:    Janette Medley Referring Phys: ERIN C LEHNER IMPRESSIONS  1. Left ventricular ejection fraction, by estimation, is 60 to 65%. The left ventricle has normal function. The left ventricle has no regional wall motion abnormalities. Left ventricular diastolic parameters were normal.  2. Right ventricular systolic function is normal. The right  ventricular size is normal.  3. The mitral valve is normal in structure. No evidence of mitral valve regurgitation. No evidence of mitral stenosis.  4. The aortic valve is tricuspid. Aortic valve regurgitation is not visualized. Aortic valve sclerosis/calcification is present, without any evidence of aortic stenosis.  5. The inferior vena cava is normal in size with greater than 50% respiratory variability, suggesting right atrial pressure of 3 mmHg. Conclusion(s)/Recommendation(s): No intracardiac source of embolism detected on this transthoracic study. Consider a transesophageal echocardiogram to exclude cardiac source of embolism if clinically indicated. FINDINGS  Left Ventricle: Left ventricular ejection fraction, by estimation, is 60 to 65%. The left ventricle has normal function. The left ventricle has no regional wall motion abnormalities. The left ventricular internal cavity size was normal in size. There is  no left ventricular hypertrophy. Left ventricular diastolic parameters were normal. Normal left ventricular filling pressure. Right Ventricle: The right ventricular size is normal. No increase in right ventricular wall thickness. Right ventricular systolic function is normal. Left Atrium: Left atrial size was normal in size. Right Atrium: Right atrial size was normal in size. Pericardium: There is no evidence of pericardial effusion. Mitral Valve: The mitral valve is normal in structure. No evidence of mitral valve regurgitation. No evidence of mitral valve stenosis. Tricuspid Valve: The tricuspid valve is normal in structure. Tricuspid valve regurgitation is not demonstrated. No evidence of tricuspid stenosis. Aortic Valve: The aortic valve is tricuspid. Aortic valve regurgitation is not visualized. Aortic valve sclerosis/calcification is present, without any evidence of aortic stenosis. Pulmonic Valve: The pulmonic valve was normal in structure. Pulmonic valve regurgitation is not visualized. No  evidence of pulmonic stenosis. Aorta: The aortic root is normal in size and structure. Venous: The inferior vena cava is normal in size with greater than 50% respiratory variability, suggesting right atrial pressure of 3 mmHg. IAS/Shunts: No atrial level shunt detected by color flow Doppler.  LEFT VENTRICLE PLAX 2D LVIDd:         4.60 cm   Diastology LVIDs:         2.90 cm   LV e' medial:    8.38 cm/s LV PW:         1.00 cm   LV E/e' medial:  10.4 LV IVS:        1.00 cm   LV e' lateral:   9.36 cm/s LVOT diam:     2.00 cm   LV E/e' lateral: 9.3 LV SV:         59 LV SV Index:   29 LVOT Area:     3.14 cm  RIGHT VENTRICLE  IVC RV S prime:     18.30 cm/s  IVC diam: 1.70 cm TAPSE (M-mode): 2.4 cm LEFT ATRIUM             Index        RIGHT ATRIUM           Index LA diam:        2.90 cm 1.42 cm/m   RA Area:     12.50 cm LA Vol (A2C):   37.3 ml 18.32 ml/m  RA Volume:   26.80 ml  13.17 ml/m LA Vol (A4C):   23.6 ml 11.59 ml/m LA Biplane Vol: 31.0 ml 15.23 ml/m  AORTIC VALVE LVOT Vmax:   91.20 cm/s LVOT Vmean:  58.700 cm/s LVOT VTI:    0.187 m  AORTA Ao Root diam: 2.70 cm Ao Asc diam:  3.50 cm MITRAL VALVE MV Area (PHT): 3.65 cm    SHUNTS MV Decel Time: 208 msec    Systemic VTI:  0.19 m MV E velocity: 87.50 cm/s  Systemic Diam: 2.00 cm MV A velocity: 94.30 cm/s MV E/A ratio:  0.93 Gaylyn Keas MD Electronically signed by Gaylyn Keas MD Signature Date/Time: 01/20/2024/12:47:29 PM    Final     Vitals:   01/21/24 2952 01/21/24 0420 01/21/24 0749 01/21/24 0930  BP: 126/77 126/77 (!) 153/88   Pulse: 74 74 78   Resp:   16 16  Temp: 98.4 F (36.9 C) 98.4 F (36.9 C) (!) 97.5 F (36.4 C)   TempSrc: Oral Oral Oral   SpO2: 98% 98% 98%   Weight:      Height:         PHYSICAL EXAM General:  Alert, well-nourished, well-developed patient in no acute distress Psych:  Mood and affect appropriate for situation CV: Regular rate and rhythm on monitor Respiratory:  Regular, unlabored respirations on room  air GI: Abdomen soft and nontender   NEURO:  Mental Status: AA&Ox3, patient is able to give clear and coherent history Speech/Language: speech is without dysarthria or aphasia.    Cranial Nerves:  II: PERRL. Visual fields full.  III, IV, VI: EOMI. Eyelids elevate symmetrically.  V: Sensation is intact to light touch and symmetrical to face.  VII: Face is symmetrical resting and smiling VIII: hearing intact to voice. IX, X: Palate elevates symmetrically. Phonation is normal.  WU:[id number] shrug weaker on the left XII: tongue is midline without fasciculations. Motor: 5/5 strength to right upper and lower extremities, 4/5 strength to left upper extremity and 3/5 strength to left lower extremity with giveaway weakness with positive hoover's sign Tone: is normal and bulk is normal Sensation- Intact to light touch bilaterally.  Coordination: FTN intact on the right, some ataxia on the left Gait- deferred  Most Recent NIH  1a Level of Conscious.: 0 1b LOC Questions: 0 1c LOC Commands: 0 2 Best Gaze: 0 3 Visual: 0 4 Facial Palsy: 0 5a Motor Arm - left: 1 5b Motor Arm - Right: 0 6a Motor Leg - Left: 2 6b Motor Leg - Right: 0 7 Limb Ataxia: 1 8 Sensory: 0 9 Best Language: 0 10 Dysarthria: 0 11 Extinct. and Inatten.: 0 TOTAL: 4   ASSESSMENT/PLAN  Ms. DONNALEE CELLUCCI is a 58 y.o. female with history of, sarcoidosis, latent TB, migraines and prediabetes admitted for acute onset left arm and leg weakness.  Patient received TNK at [facility] with some delays in administration due to hypertension and was transferred here for post TNK care.  MRI  was negative for acute infarct, but some left-sided weakness remains.  NIH on Admission 5  Strokelike episode status post TNK Concerning for hypertensive encephalopathy versus conversion disorder Patient had significant left upper and lower extremity giveaway weakness and positive Hoover signs Code Stroke CT head No acute abnormality. Small  vessel disease. ASPECTS 10.    CTA head & neck no LVO or hemodynamically significant stenosis MRI no acute abnormality, chronic small vessel ischemic disease and volume loss CT Head 24 hr- no acute intracranial abnormality  2D Echo EF 60 to 65% LDL 84 HgbA1c 5.5 UDS negative VTE prophylaxis -SCDs No antithrombotic prior to admission, now on ASA 81mg . Continue on discharge Therapy recommendations:  CIR Disposition: Pending  Hypertensive encephalopathy Home meds: Hydrochlorothiazide 25 mg daily Extremely high BP on presentation Currently stable on high end Resume home HCTZ 25 Add amlodipine  10 Long-term BP goal normotensive  Hyperlipidemia Home meds: None LDL 84, goal < 70 Add rosuvastatin 20 mg daily Continue statin at discharge  Other Stroke Risk Factors Obesity, Body mass index is 37.69 kg/m., BMI >/= 30 associated with increased stroke risk, recommend weight loss, diet and exercise as appropriate  Migraine  Other Active Problems Latent TB-continue home isoniazid  and pyridoxine  (of note, patient was taking this medication as needed at home, will need counseling on proper administration of this medication) Sarcoidosis, follow-up with pulmonary, not on steroids  Hospital day # 2  Patient seen and examined by NP/APP with MD. MD to update note as needed.   Imogene Mana, DNP, FNP-BC [facility] Pager: [phone]  ATTENDING NOTE: I reviewed above note and agree with the assessment and plan. Pt was seen and examined.   Pt still has left sided giveaway weakness,concerning for conversion disorder. But she did have continued high BP, add amlodipine  to hydrochlorothiazide. PT and OT recommend CIR.   For detailed assessment and plan, please refer to above as I have made changes wherever appropriate.   Consuelo Denmark, MD PhD Stroke Neurology 01/21/2024 4:40 PM     To contact Stroke Continuity provider, please refer to [email or web address]. After hours, contact General  Neurology

## 2024-01-21 NOTE — PMR Pre-admission (Signed)
 PMR Admission Coordinator Pre-Admission Assessment  Patient: Jackie Reed is an 58 y.o., female MRN: [id number] DOB: 01-13-1966 Height: 5\' 4"  (162.6 cm) Weight: 99.6 kg  Insurance Information HMO:     PPO: yes     PCP:      IPA:      80/20:      OTHER:  PRIMARY: [facility]      Policy#: [id number]      Subscriber: patient CM Name: Montgomery Apgar      Phone#: [phone]     Fax#: [phone] Pre-Cert#: [id number]  approved for 7 days 01/28/24 until 02/03/24. Updates due 6/13.     Employer: [employer] Benefits:  Phone #: [phone]     Name: 6/3 Eff. Date: 08/25/23     Deduct: $2800      Out of Pocket Max: $5600      Life Max: none CIR: 80%      SNF: 80% Outpatient: 80%     Co-Pay: 20% Home Health: 80%      Co-Pay: 20% DME: 80%     Co-Pay: 20% Providers: in-network  SECONDARY:       Policy#:      Phone#:   Artist:       Phone#:   The Data processing manager" for patients in Inpatient Rehabilitation Facilities with attached "Privacy Act Statement-Health Care Records" was provided and verbally reviewed with: Patient and Family  Emergency Contact Information Contact Information     Name Relation Home Work Mobile   [place] Spouse   [phone]   Lei Pump [phone]  [phone]   French,Charles Brother [phone]  [phone]      Other Contacts   None on File    Current Medical History  Patient Admitting Diagnosis: hypertensive encephalopathy  History of Present Illness:  Jackie Reed is a 58 y.o. female with PMH of HTN, sarcoidosis, latent TB undergoing treatment, who presented to [facility] with sudden onset left arm and leg weakness on 5/28. Presented to [facility].She was noted to be extremely hypertensive with systolic >220's and received labetalol  and hydralazine  prior to administration of TNK. She was then transferred to [facility] ICU on 01/19/24 for monitoring and blood pressure control.   MRI completed 5/29 which was negative for acute  infarct. Concerning for HTN encephalopathy vs conversion disorder. Placed on ASA. Home HCTZ resumed. Added amlodipine . LDL 84 and added rosuvastatin .    She was noted to have left upper extremity tremors with concerns for focal motor seizure, however spot EEG was negative. With concern for possible cervical spine pathology contributing to weakness MRI was completed which showed minor cord flattening without cord signal changes or significant spinal stenosis.  Complete NIHSS TOTAL: 2  Patient's medical record from [facility] has been reviewed by the rehabilitation admission coordinator and physician.  Past Medical History  Past Medical History:  Diagnosis Date   Dyspnea    occasional   E-coli UTI 08/2019   Hepatitis 05/2020   Hepatitis B core antibody positive   Hypertension    Migraine    Plantar fibromatosis    PONV (postoperative nausea and vomiting)    Pre-diabetes    Thyroid  nodule 01/29/2023   seen on PET scan   Vitamin B12 deficiency 06/2019   Vitamin D  deficiency 06/2019   Has the patient had major surgery during 100 days prior to admission? No  Family History   family history includes Diabetes in her mother; Heart failure in  her father and mother; Hyperlipidemia in her father; Hypertension in her father.  Current Medications  Current Facility-Administered Medications:    acetaminophen  (TYLENOL ) tablet 975 mg, 975 mg, Oral, Q8H PRN, de Thayne Fine, Cortney E, NP, 975 mg at 01/28/24 1013   amLODipine  (NORVASC ) tablet 10 mg, 10 mg, Oral, Daily, Consuelo Denmark, MD, 10 mg at 01/28/24 1006   aspirin  EC tablet 81 mg, 81 mg, Oral, Daily, Consuelo Denmark, MD, 81 mg at 01/28/24 1006   docusate sodium (COLACE) capsule 100 mg, 100 mg, Oral, BID, Palikh, Gaurang M, MD, 100 mg at 01/28/24 1006   enoxaparin (LOVENOX) injection 50 mg, 50 mg, Subcutaneous, Q24H, Sethi, Pramod S, MD, 50 mg at 01/28/24 1223   fluticasone  furoate-vilanterol (BREO ELLIPTA ) 100-25  MCG/ACT 1 puff, 1 puff, Inhalation, Daily, Augustin Leber, MD, 1 puff at 01/28/24 9604   hydrochlorothiazide  (HYDRODIURIL ) tablet 25 mg, 25 mg, Oral, Daily, Consuelo Denmark, MD, 25 mg at 01/28/24 1006   isoniazid  (NYDRAZID ) tablet 300 mg, 300 mg, Oral, q AM, Paytes, Austin A, RPH, 300 mg at 01/28/24 1006   Oral care mouth rinse, 15 mL, Mouth Rinse, PRN, Augustin Leber, MD   pyridOXINE  (VITAMIN B6) tablet 50 mg, 50 mg, Oral, Daily, Augustin Leber, MD, 50 mg at 01/28/24 1006   rosuvastatin  (CRESTOR ) tablet 20 mg, 20 mg, Oral, Daily, de Thayne Fine, Sedgwick E, NP, 20 mg at 01/28/24 1006  Patients Current Diet:  Diet Order             Diet regular Room service appropriate? Yes; Fluid consistency: Thin  Diet effective now                  Precautions / Restrictions Precautions Precautions: Fall Restrictions Weight Bearing Restrictions Per Provider Order: No   Has the patient had 2 or more falls or a fall with injury in the past year? No  Prior Activity Level Community (5-7x/wk): gets out of hosue ~3 days/week; drives  Prior Functional Level Self Care: Did the patient need help bathing, dressing, using the toilet or eating? Independent  Indoor Mobility: Did the patient need assistance with walking from room to room (with or without device)? Independent  Stairs: Did the patient need assistance with internal or external stairs (with or without device)? Independent  Functional Cognition: Did the patient need help planning regular tasks such as shopping or remembering to take medications? Independent  Patient Information Are you of Hispanic, Latino/a,or Spanish origin?: A. No, not of Hispanic, Latino/a, or Spanish origin What is your race?: B. Black or African American Do you need or want an interpreter to communicate with a doctor or health care staff?: 0. No  Patient's Response To:  Health Literacy and Transportation Is the patient able to respond to health  literacy and transportation needs?: Yes Health Literacy - How often do you need to have someone help you when you read instructions, pamphlets, or other written material from your doctor or pharmacy?: Never In the past 12 months, has lack of transportation kept you from medical appointments or from getting medications?: No In the past 12 months, has lack of transportation kept you from meetings, work, or from getting things needed for daily living?: No  Home Assistive Devices / Equipment Home Equipment: None  Prior Device Use: Indicate devices/aids used by the patient prior to current illness, exacerbation or injury? None of the above  Current Functional Level Cognition  Arousal/Alertness: Awake/alert Overall Cognitive Status: Impaired/Different from baseline Orientation Level:  Oriented X4 Attention: Sustained Sustained Attention: Impaired Sustained Attention Impairment: Verbal complex Memory: Appears intact Awareness: Appears intact Problem Solving: Impaired Problem Solving Impairment: Verbal complex Safety/Judgment: Appears intact    Extremity Assessment (includes Sensation/Coordination)  Upper Extremity Assessment: LUE deficits/detail LUE Deficits / Details: continued ataxic movements, appears to exacerbate when stressed or overwhelmed LUE Sensation: WNL LUE Coordination: decreased gross motor, decreased fine motor  Lower Extremity Assessment: Overall WFL for tasks assessed, LLE deficits/detail LLE Deficits / Details: ataxic movement noted in the LLE intermittently during gait with 2+/5 L hip strength intermittently  and knee flexion with low floor clearance.    ADLs  Overall ADL's : Needs assistance/impaired Eating/Feeding: Sitting, Minimal assistance Eating/Feeding Details (indicate cue type and reason): min A, unable to hold utensils with dominant LUE. LUE ataxic, has been using RUE to eat- ordering simple hand held meals. Grooming: Wash/dry hands, Wash/dry face, Oral care,  Sitting, Standing, Minimal assistance Grooming Details (indicate cue type and reason): pt. able to stand for washing hands, guiding L hand and allowing it to reduce to minimal ataxic movements then guiding R hand to help wash it, pt. able to also take L hand and guide it to wash R also.  washed face primarily with R hand.  for oral care pt. performed in sitting due to fatigue from standing.  utilized basin, and cup for rinsing. Upper Body Bathing: Sitting, Minimal assistance Lower Body Bathing: Moderate assistance, Sitting/lateral leans Upper Body Dressing : Moderate assistance, Sitting Lower Body Dressing: Contact guard assist, Bed level Lower Body Dressing Details (indicate cue type and reason): able to don L sock Toilet Transfer: Minimal assistance, Rolling walker (2 wheels), Ambulation, Cueing for sequencing, Cueing for safety Toilet Transfer Details (indicate cue type and reason): bed to chair Toileting- Clothing Manipulation and Hygiene: Moderate assistance, Sit to/from stand Functional mobility during ADLs: Minimal assistance, Rolling walker (2 wheels) General ADL Comments: Patient making steady progress towards goals. Patient session focus on cognition and further assessment for functional independence.Patient completing "Mixed Up Grocery List" activity, and able to sort through word bank, complete process of elimination and fix one error without OT assist. Of note, patient is using R hand to write but is typically L handed. OT noting minimal to no tremors when patient was completing activity using L index as a pointer in the wordbank, but tremulous with attempting to write her name. Strategies provided to promote [facility] throughout the day.  Patient continues to be an excellent candidate for intensive rehab >3 hours. OT will continue to follow.    Mobility  Overal bed mobility: Needs Assistance Bed Mobility: Supine to Sit Supine to sit: Contact guard General bed mobility comments: received in  recliner    Transfers  Overall transfer level: Needs assistance Equipment used: Rolling walker (2 wheels) Transfers: Sit to/from Stand Sit to Stand: Min assist Bed to/from chair/wheelchair/BSC transfer type:: Step pivot Step pivot transfers: Min assist General transfer comment: Cues for hand placement, able to complete min A solely for safety, increased time required    Ambulation / Gait / Stairs / Wheelchair Mobility  Ambulation/Gait Ambulation/Gait assistance: Min assist, +2 safety/equipment, Mod assist Gait Distance (Feet): 105 Feet (75+30) Assistive device: Rolling walker (2 wheels) Gait Pattern/deviations: Step-to pattern, Ataxic, Decreased step length - left, Decreased stance time - left General Gait Details: Very slow gait with poor floor clearance, step to gait pattern with decreased step length and stance time on the L with intermittent ataxic movements of the LLE with poor progression  anteriorly.  Pt with tremor at times in left LE and UE which worsened the longer she ambulated. Mod A once fatigued. Cues for longer step length on LLE. Trialed mirror in front of pt for longer step length however only slight improvement noted in step length. Gait velocity: decreased Gait velocity interpretation: <1.31 ft/sec, indicative of household ambulator    Posture / Balance Dynamic Sitting Balance Sitting balance - Comments: CGA sitting EOB Balance Overall balance assessment: Needs assistance Sitting-balance support: No upper extremity supported, Feet supported Sitting balance-Leahy Scale: Fair Sitting balance - Comments: CGA sitting EOB Standing balance support: Reliant on assistive device for balance, Bilateral upper extremity supported, During functional activity Standing balance-Leahy Scale: Poor Standing balance comment: reliant on RW    Special needs/care consideration External Urinary Catheter   Previous Home Environment Living Arrangements: Spouse/significant other  Lives  With: Spouse Available Help at Discharge: Family, Available 24 hours/day Type of Home: House Home Layout: Other (Comment) (split level) Alternate Level Stairs-Number of Steps: 5 stairs down to den; 6 stairs up to bedroom Home Access: Stairs to enter Entrance Stairs-Rails: None Entrance Stairs-Number of Steps: 2-higher steps Bathroom Shower/Tub: Engineer, manufacturing systems: Standard Bathroom Accessibility: Yes How Accessible: Accessible via walker Home Care Services: No 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, Sports coach  Discharge Living Setting Plans for Discharge Living Setting: Patient's home Type of Home at Discharge: House Discharge Home Layout: Other (Comment) (split level) Discharge Home Access: Stairs to enter Entrance Stairs-Rails: None Entrance Stairs-Number of Steps: 2 Discharge Bathroom Shower/Tub: Tub/shower unit Discharge Bathroom Toilet: Standard Discharge Bathroom Accessibility: Yes How Accessible: Accessible via walker  Social/Family/Support Systems Anticipated Caregiver: [name] (husband) and Satira Curet (sister-in-law) Anticipated Caregiver's Contact Information: [name]: [phone] Silvers: [phone] Caregiver Availability: Other (Comment) (close to 24/7 support) Discharge Plan Discussed with Primary Caregiver: Yes Is Caregiver In Agreement with Plan?: Yes Does Caregiver/Family have Issues with Lodging/Transportation while Pt is in Rehab?: No  Her sister in Law can provide assistance when her spouse works. He works as a Investment banker, operational at [employer].   Goals Patient/Family Goal for Rehab: Mod I-Supervision: PT/OT, Mod I: ST Expected length of stay: 7-10 days Pt/Family Agrees to Admission and willing to participate: Yes Program Orientation Provided & Reviewed with Pt/Caregiver Including Roles  & Responsibilities: Yes  Decrease burden of Care through IP rehab admission: NA  Possible need for SNF placement upon discharge: Not anticipated  Patient  Condition: This patient's condition remains as documented in the consult dated 01/24/2024, in which the Rehabilitation Physician determined and documented that the patient's condition is appropriate for intensive rehabilitative care in an inpatient rehabilitation facility. Will admit to inpatient rehab today.  Preadmission Screen Completed By:  Jeannetta Millman RN MSN, 01/28/2024 1:40 PM ______________________________________________________________________   Discussed status with Dr. Dorn Gaskins on 01/28/24 at 1343 and received approval for admission today.  Admission Coordinator:  Jeannetta Millman RN MSN, time 1343 Date 01/28/24    MD Signature:  Bea Lime, DO 01/28/2024

## 2024-01-21 NOTE — Evaluation (Signed)
 Physical Therapy Evaluation Patient Details Name: Jackie Reed MRN: [id number] DOB: 1965/10/14 Today's Date: 01/21/2024  History of Present Illness  Pt is a 58 yo female admitted to [facility] on 01/19/24 for acute onset L sided weakness. She received TNK at [facility], MRI was negative for acute infarcts. PMH of  sarcoidosis, latent TB, migraines and prediabetes.  Clinical Impression  Pt is presenting below baseline level of functioning. Prior to hospitalization pt was ind with mobility. Currently pt requires Min - mod A for sit to stand and Min to CGA for short distance gait with RW. Pt takes significant increase in time with gait ambulating 25 ft in 24 min with ataxic movements sporadically at the LUE/LLE. Due to pt current functional status, home set up and available assistance at home recommending skilled physical therapy services > 3 hours/day in order to address strength, balance and functional mobility to decrease risk for falls, injury, immobility, skin break down and re-hospitalization.         If plan is discharge home, recommend the following: A little help with walking and/or transfers;Assistance with cooking/housework;Assist for transportation;Help with stairs or ramp for entrance     Equipment Recommendations Rolling walker (2 wheels);BSC/3in1  Recommendations for Other Services  Rehab consult    Functional Status Assessment Patient has had a recent decline in their functional status and demonstrates the ability to make significant improvements in function in a reasonable and predictable amount of time.     Precautions / Restrictions Precautions Precautions: Fall Recall of Precautions/Restrictions: Intact Restrictions Weight Bearing Restrictions Per Provider Order: No      Mobility  Bed Mobility               General bed mobility comments: Pt in recliner on arrival and departure    Transfers Overall transfer level: Needs assistance Equipment used: Rolling walker (2  wheels) Transfers: Sit to/from Stand Sit to Stand: Min assist           General transfer comment: mod a to boost into standing.    Ambulation/Gait Ambulation/Gait assistance: Contact guard assist, Min assist Gait Distance (Feet): 25 Feet Assistive device: Rolling walker (2 wheels) Gait Pattern/deviations: Step-to pattern, Ataxic, Decreased step length - left, Decreased stance time - left Gait velocity: decreased Gait velocity interpretation: <1.31 ft/sec, indicative of household ambulator   General Gait Details: Very slow gait with poor floor clearance, step to gait pattern with decreased step length and stance time on the L with intermittent ataxic movements of the LLE with poor progression anteriorly.     Balance Overall balance assessment: Needs assistance Sitting-balance support: No upper extremity supported, Feet supported Sitting balance-Leahy Scale: Fair     Standing balance support: Reliant on assistive device for balance, Bilateral upper extremity supported, During functional activity Standing balance-Leahy Scale: Poor Standing balance comment: reliant on RW         Pertinent Vitals/Pain Pain Assessment Pain Assessment: No/denies pain    Home Living Family/patient expects to be discharged to:: Private residence Living Arrangements: Spouse/significant other Available Help at Discharge: Family;Available PRN/intermittently Type of Home: House (split) Home Access: Stairs to enter Entrance Stairs-Rails: Right (upstairs to bedroom only) Entrance Stairs-Number of Steps: 2-higher steps Alternate Level Stairs-Number of Steps: 5-6 up to bed rooms (R) rail going up, 5-6 to living Home Layout: Other (Comment) (split level) Home Equipment: None 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 biceps flex 40 deg, ataxic. LUE Sensation: WNL LUE Coordination: decreased gross motor;decreased fine motor    Lower Extremity Assessment Lower Extremity Assessment: Overall WFL for tasks assessed;LLE deficits/detail LLE Deficits / Details: ataxic movement noted in the LLE intermittently during gait with 2+/5 L hip strength intermittently  and knee flexion with low floor clearance.    Cervical / Trunk Assessment Cervical / Trunk Assessment: Normal  Communication   Communication Communication: No apparent difficulties    Cognition Arousal: Alert Behavior During Therapy: WFL for tasks assessed/performed   PT - Cognitive impairments: No apparent impairments     Following commands: Intact       Cueing Cueing Techniques: Verbal cues     General Comments General comments (skin integrity, edema, etc.): No noted sign/symptoms of cardiac/respiratory distress during session        Assessment/Plan    PT Assessment Patient needs continued PT services  PT Problem List Decreased strength;Decreased balance;Decreased mobility;Decreased coordination       PT Treatment Interventions DME instruction;Balance training;Gait training;Neuromuscular re-education;Stair training;Functional mobility training;Therapeutic activities;Therapeutic exercise;Patient/family education    PT Goals (Current goals can be found in the Care Plan section)  Acute Rehab PT Goals Patient Stated Goal: to improve mobility and get back to baseline. PT Goal Formulation: With patient Time For Goal Achievement: 02/04/24 Potential to Achieve Goals: Good    Frequency Min 2X/week        AM-PAC PT "6 Clicks" Mobility  Outcome Measure Help  needed turning from your back to your side while in a flat bed without using bedrails?: A Little Help needed moving from lying on your back to sitting on the side of a flat bed without using bedrails?: A Little Help needed moving to and from a bed to a chair (including a wheelchair)?: A Little Help needed standing up from a chair using your arms (e.g., wheelchair or bedside chair)?: A Little Help needed to walk in hospital room?: A Little Help needed climbing 3-5 steps with a railing? : A Lot 6 Click Score: 17    End of Session Equipment Utilized During Treatment: Gait belt Activity Tolerance: Patient tolerated treatment well Patient left: in chair;with call bell/phone within reach;with chair alarm set Nurse Communication: Mobility status PT Visit Diagnosis: Unsteadiness on feet (R26.81);Other abnormalities of gait and mobility (R26.89)    Time: 8119-1478 PT Time Calculation (min) (ACUTE ONLY): 25 min   Charges:   PT Evaluation $PT Eval Low Complexity: 1 Low PT Treatments $Therapeutic Activity: 8-22 mins PT General Charges $$ ACUTE PT VISIT: 1 Visit         Sloan Duncans, DPT, CLT  Acute Rehabilitation Services Office: [phone] (Secure chat preferred)   Jenice Mitts 01/21/2024, 12:33 PM

## 2024-01-21 NOTE — Progress Notes (Signed)
 Inpatient Rehab Admissions:  Inpatient Rehab Consult received.  I met with patient at the bedside for rehabilitation assessment and to discuss goals and expectations of an inpatient rehab admission. Also spoke with pt's husband, [name] on speakerphone. Discussed average length of stay, insurance authorization requirement and discharge home after completion of CIR. Both acknowledged understanding. Pt interested in pursuing CIR and [name] supportive. [name] confirmed that family will be able to provide support for pt after discharge. Will continue to follow.  Signed: Artemus Larsen, MS, CCC-SLP Admissions Coordinator [phone]

## 2024-01-21 NOTE — TOC Initial Note (Signed)
 Transition of Care [facility]) - Initial/Assessment Note    Patient Details  Name: Jackie Reed MRN: [id number] Date of Birth: 01/09/1966  Transition of Care [facility]) CM/SW Contact:    Jonathan Neighbor, RN Phone Number: 01/21/2024, 10:12 AM  Clinical Narrative:                  Pt is s/p TNK. Pt is from home with her spouse. She works from home and her spouse works outside the home from 6 am -2 pm.  No DME at home. Pt and spouse drive.  Pt manages her own medications and denies issues but CM notes some mention of noncompliance.  Pt states their are 2 sets of stairs in the home and stairs to enter the home.  Current recommendations are for CIR. TOC following.  Expected Discharge Plan: IP Rehab Facility Barriers to Discharge: Continued Medical Work up   Patient Goals and CMS Choice   CMS Medicare.gov Compare Post Acute Care list provided to:: Patient Choice offered to / list presented to : Patient      Expected Discharge Plan and Services   Discharge Planning Services: CM Consult Post Acute Care Choice: IP Rehab Living arrangements for the past 2 months: Single Family Home                                      Prior Living Arrangements/Services Living arrangements for the past 2 months: Single Family Home Lives with:: Spouse Patient language and need for interpreter reviewed:: Yes Do you feel safe going back to the place where you live?: Yes            Criminal Activity/Legal Involvement Pertinent to Current Situation/Hospitalization: No - Comment as needed  Activities of Daily Living      Permission Sought/Granted                  Emotional Assessment Appearance:: Appears stated age Attitude/Demeanor/Rapport: Engaged Affect (typically observed): Accepting Orientation: : Oriented to Self, Oriented to Place, Oriented to  Time, Oriented to Situation   Psych Involvement: No (comment)  Admission diagnosis:  CVA (cerebral vascular accident) (HCC)  [I63.9] Cerebrovascular accident (CVA), unspecified mechanism (HCC) [I63.9] Patient Active Problem List   Diagnosis Date Noted   Hypertensive encephalopathy 01/20/2024   Medication monitoring encounter 10/01/2023   TB lung, latent 08/27/2023   Sarcoidosis 04/22/2023   Adenopathy 02/09/2023   History of COVID-19 10/03/2020   Hepatitis B carrier (HCC) 07/03/2020   Mass of left hand 10/02/2019   Elevated blood pressure reading in office with diagnosis of hypertension 09/11/2019   Pre-diabetes 06/14/2019   Low back pain without sciatica 03/07/2019   Back muscle spasm 03/07/2019   History of recent fall 12/31/2018   Sprain of right ankle 11/18/2018   Class 2 severe obesity due to excess calories with serious comorbidity and body mass index (BMI) of 35.0 to 35.9 in adult [facility]) 10/31/2018   Essential hypertension, benign 11/08/2015   Tendon calcification 11/08/2015   Adjustment disorder with mixed anxiety and depressed mood 11/08/2015   Insomnia 11/08/2015   PCP:  Aleta Anda, MD Pharmacy:   [facility][id number] Jonette Nestle, [place] - [address] AT [facility] & [address] [place] [address] Phone: [phone] Fax: [phone]     Social Drivers of Health (SDOH) Social History: SDOH Screenings  Food Insecurity: No Food Insecurity (01/19/2024)  Housing: Unknown (01/19/2024)  Transportation Needs: No Transportation Needs (01/19/2024)  Utilities: Not At Risk (01/19/2024)  Depression (PHQ2-9): Low Risk  (08/27/2023)  Financial Resource Strain: Low Risk  (09/28/2023)   Received from [facility]  Social Connections: Unknown (12/22/2021)   Received from [facility]  Tobacco Use: Low Risk  (01/19/2024)   SDOH Interventions:     Readmission Risk Interventions     No data to display

## 2024-01-21 NOTE — Evaluation (Signed)
 Speech Language Pathology Evaluation Patient Details Name: Jackie Reed MRN: [id number] DOB: 1966/08/10 Today's Date: 01/21/2024 Time: 8295-6213 SLP Time Calculation (min) (ACUTE ONLY): 15 min  Problem List:  Patient Active Problem List   Diagnosis Date Noted   Hypertensive encephalopathy 01/20/2024   Medication monitoring encounter 10/01/2023   TB lung, latent 08/27/2023   Sarcoidosis 04/22/2023   Adenopathy 02/09/2023   History of COVID-19 10/03/2020   Hepatitis B carrier (HCC) 07/03/2020   Mass of left hand 10/02/2019   Elevated blood pressure reading in office with diagnosis of hypertension 09/11/2019   Pre-diabetes 06/14/2019   Low back pain without sciatica 03/07/2019   Back muscle spasm 03/07/2019   History of recent fall 12/31/2018   Sprain of right ankle 11/18/2018   Class 2 severe obesity due to excess calories with serious comorbidity and body mass index (BMI) of 35.0 to 35.9 in adult [facility]) 10/31/2018   Essential hypertension, benign 11/08/2015   Tendon calcification 11/08/2015   Adjustment disorder with mixed anxiety and depressed mood 11/08/2015   Insomnia 11/08/2015   Past Medical History:  Past Medical History:  Diagnosis Date   Dyspnea    occasional   E-coli UTI 08/2019   Hepatitis 05/2020   Hepatitis B core antibody positive   Hypertension    Migraine    Plantar fibromatosis    PONV (postoperative nausea and vomiting)    Pre-diabetes    Thyroid  nodule 01/29/2023   seen on PET scan   Vitamin B12 deficiency 06/2019   Vitamin D  deficiency 06/2019   Past Surgical History:  Past Surgical History:  Procedure Laterality Date   ABDOMINAL HYSTERECTOMY     FINE NEEDLE ASPIRATION  02/19/2023   Procedure: FINE NEEDLE ASPIRATION (FNA) LINEAR;  Surgeon: Prudy Brownie, DO;  Location: [facility];  Service: Cardiopulmonary;;   FOOT SURGERY Bilateral 1985   MASS EXCISION Right 05/19/2018   Procedure: EXCISION PALMAR NODULES RIGHT HAND;  Surgeon: Brunilda Capra, MD;  Location: [facility];  Service: Orthopedics;  Laterality: Right;   PARTIAL HYSTERECTOMY  1997   VIDEO BRONCHOSCOPY WITH ENDOBRONCHIAL ULTRASOUND Bilateral 02/19/2023   Procedure: VIDEO BRONCHOSCOPY WITH ENDOBRONCHIAL ULTRASOUND;  Surgeon: Prudy Brownie, DO;  Location: [facility];  Service: Cardiopulmonary;  Laterality: Bilateral;   HPI:  Pt is a 58 yo female admitted to [facility] on 01/19/24 for acute onset L sided weakness. She received TNK at [facility], MRI was negative for acute infarcts. PMH of  sarcoidosis, latent TB, migraines and prediabetes.   Assessment / Plan / Recommendation Clinical Impression  Pt presents with functional communication but mild cognitive deficits, scoring 21/30 on the SLUMS (21-26 suggestive of mild impairment). She often needed additional processing time or asked for repeated instructions. She had significant difficulty with calculations, although does share that this is not a strength of hers even at baseline. She seemed to have fairly good awareness of her errors when reviewing testing at the end of this eval, but did not seem to be aware of numbers that she wrote in mirror image when drawing the clock. 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 As this likely represents an acute decline, recommend ongoing SLP f/u to maximize safety and independence.      SLP Assessment  SLP Recommendation/Assessment: Patient needs continued Speech Lanaguage Pathology Services SLP Visit Diagnosis: Cognitive communication deficit (Y86.578)    Recommendations for  follow up therapy are one component of a multi-disciplinary discharge planning process, led by the attending physician.  Recommendations may be updated based on patient status, additional functional criteria and insurance authorization.    Follow Up Recommendations  Acute inpatient rehab  (3hours/day)    Assistance Recommended at Discharge  Intermittent Supervision/Assistance  Functional Status Assessment Patient has had a recent decline in their functional status and demonstrates the ability to make significant improvements in function in a reasonable and predictable amount of time.  Frequency and Duration min 2x/week  2 weeks      SLP Evaluation Cognition  Overall Cognitive Status: Impaired/Different from baseline Arousal/Alertness: Awake/alert Orientation Level: Oriented X4 Attention: Sustained Sustained Attention: Impaired Sustained Attention Impairment: Verbal complex Memory: Appears intact Awareness: Appears intact Problem Solving: Impaired Problem Solving Impairment: Verbal complex Safety/Judgment: Appears intact       Comprehension  Auditory Comprehension Overall Auditory Comprehension: Impaired Commands: Within Functional Limits Conversation: Simple Interfering Components: Attention;Processing speed    Expression Expression Primary Mode of Expression: Verbal Verbal Expression Overall Verbal Expression: Appears within functional limits for tasks assessed   Oral / Motor  Motor Speech Overall Motor Speech: Appears within functional limits for tasks assessed            Beth Brooke., M.A. CCC-SLP Acute Rehabilitation Services Office: [phone]  Secure chat preferred  01/21/2024, 2:22 PM

## 2024-01-21 NOTE — Evaluation (Signed)
 Occupational Therapy Evaluation Patient Details Name: Jackie Reed MRN: [id number] DOB: [birth year] Today's Date: 01/21/2024   History of Present Illness   Pt is a 58 yo female admitted to [facility] on 01/19/24 for acute onset L sided weakness. She received TNK at [facility], MRI was negative for acute infarcts. PMH of  sarcoidosis, latent TB, migraines and prediabetes.     Clinical Impressions Pt admitted for above, PTA pt reports being ind with ADLs/iADLs and no challenges with medication management. Pt currently presenting with LUE/LLE weakness and impaired coordination impacting her functional mobility and performance in ADLs. Pt needing mod A to min A + RW to ambulate 75ft in room with cues for stride to reduce risk of buckling, dominant LUE unable to coordinate and has very little assist in ADLs. OT to continue following pt acutely to address listed deficits and help transition to next level of care. Patient has the potential to reach Mod I and demos the ability to tolerate 3 hours of therapy. Pt would benefit from an intensive rehab program to help maximize functional independence.      If plan is discharge home, recommend the following:   A little help with walking and/or transfers;A lot of help with bathing/dressing/bathroom;Assistance with cooking/housework;Assist for transportation     Functional Status Assessment   Patient has had a recent decline in their functional status and demonstrates the ability to make significant improvements in function in a reasonable and predictable amount of time.     Equipment Recommendations   Other (comment);Tub/shower seat (RW)     Recommendations for Other Services   Rehab consult     Precautions/Restrictions   Precautions Precautions: Fall Recall of Precautions/Restrictions: Intact Restrictions Weight Bearing Restrictions Per Provider Order: No     Mobility Bed Mobility Overal bed mobility: Needs Assistance Bed Mobility: Supine to  Sit     Supine to sit: Min assist     General bed mobility comments: min A to assist with LLE movement    Transfers Overall transfer level: Needs assistance Equipment used: Rolling walker (2 wheels) Transfers: Sit to/from Stand Sit to Stand: Mod assist           General transfer comment: mod a to boost into standing.      Balance Overall balance assessment: Needs assistance Sitting-balance support: No upper extremity supported, Feet supported Sitting balance-Leahy Scale: Fair Sitting balance - Comments: CGA sitting EOB   Standing balance support: Reliant on assistive device for balance, Bilateral upper extremity supported, During functional activity Standing balance-Leahy Scale: Poor Standing balance comment: reliant on RW                           ADL either performed or assessed with clinical judgement   ADL Overall ADL's : Needs assistance/impaired Eating/Feeding: Sitting;Minimal assistance Eating/Feeding Details (indicate cue type and reason): min A, unable to hold utensils with dominant LUE. LUE ataxic, has been using RUE to eat- ordering simple hand held meals. Grooming: Sitting;Wash/dry face;Contact guard assist Grooming Details (indicate cue type and reason): mainly using RUE Upper Body Bathing: Sitting;Minimal assistance   Lower Body Bathing: Moderate assistance;Sitting/lateral leans   Upper Body Dressing : Moderate assistance;Sitting   Lower Body Dressing: Maximal assistance;Sitting/lateral leans Lower Body Dressing Details (indicate cue type and reason): don bilat socks, educated pt on one handed dressing techniques. LLE ataxia and weakness now unable to obtain figure four position now Toilet Transfer: Minimal assistance;Rolling walker (2 wheels) Toilet Transfer  Details (indicate cue type and reason): min to mod A, short distance amb Toileting- Clothing Manipulation and Hygiene: Sit to/from stand;Maximal assistance       Functional mobility  during ADLs: Minimal assistance;Rolling walker (2 wheels)       Vision Baseline Vision/History: 0 No visual deficits Ability to See in Adequate Light: 0 Adequate Vision Assessment?: No apparent visual deficits;Wears glasses for reading     Perception Perception: Within Functional Limits       Praxis Praxis: [facility]       Pertinent Vitals/Pain Pain Assessment Pain Assessment: No/denies pain     Extremity/Trunk Assessment Upper Extremity Assessment Upper Extremity Assessment: LUE deficits/detail;Left hand dominant LUE Deficits / Details: min shoulder shrug, shoulder flexion 10 deg, weak grip but able to hold RW with assist for positioning. biceps flex 40 deg, ataxic. LUE Sensation: WNL LUE Coordination: decreased gross motor;decreased fine motor           Communication Communication Communication: No apparent difficulties   Cognition Arousal: Alert Behavior During Therapy: WFL for tasks assessed/performed Cognition: No apparent impairments             OT - Cognition Comments: no noted cognitive deficits. would be good to perform medi cog given notes of medication non compliance                 Following commands: Intact       Cueing  General Comments   Cueing Techniques: Verbal cues  VSS RA,   Exercises     Shoulder Instructions      Home Living Family/patient expects to be discharged to:: Private residence Living Arrangements: Spouse/significant other Available Help at Discharge: Family;Available PRN/intermittently Type of Home: House (split) Home Access: Stairs to enter Entrance Stairs-Number of Steps: 2-higher steps Entrance Stairs-Rails: Right (upstairs to bedroom only) Home Layout: Other (Comment) (split level) Alternate Level Stairs-Number of Steps: 5-6 up to bed rooms (R) rail going up, 5-6 to living   Bathroom Shower/Tub: Chief Strategy Officer: Standard     Home Equipment: None   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,  Sports coach      Prior Functioning/Environment Prior Level of Function : Independent/Modified Independent;Driving;Working/employed;History of Falls (last six months)             Mobility Comments: ind ADLs Comments: ind    OT Problem List: Impaired UE functional use;Impaired tone;Decreased coordination;Impaired balance (sitting and/or standing);Decreased strength;Decreased range of motion   OT Treatment/Interventions: Self-care/ADL training;Visual/perceptual remediation/compensation;Therapeutic exercise;Patient/family education;Balance training;Therapeutic activities;DME and/or AE instruction      OT Goals(Current goals can be found in the care plan section)   Acute Rehab OT Goals Patient Stated Goal: To get better OT Goal Formulation: With patient Time For Goal Achievement: 02/04/24 Potential to Achieve Goals: Good   OT Frequency:  Min 2X/week    Co-evaluation              AM-PAC OT "6 Clicks" Daily Activity     Outcome Measure Help from another person eating meals?: A Little Help from another person taking care of personal grooming?: A Little Help from another person toileting, which includes using toliet, bedpan, or urinal?: A Lot Help from another person bathing (including washing, rinsing, drying)?: A Lot Help from another person to put on and taking off regular upper body clothing?: A Lot Help from another person to put on and taking off regular lower body clothing?: A Lot 6 Click Score: 14   End of Session Equipment  Utilized During Treatment: Gait belt;Rolling walker (2 wheels) Nurse Communication: Mobility status  Activity Tolerance: Patient tolerated treatment well Patient left: in chair;with call bell/phone within reach  OT Visit Diagnosis: Unsteadiness on feet (R26.81);Other abnormalities of gait and mobility (R26.89);Ataxia, unspecified (R27.0);Muscle weakness (generalized) (M62.81);Hemiplegia and hemiparesis Hemiplegia - Right/Left:  Left Hemiplegia - dominant/non-dominant: Dominant Hemiplegia - caused by: Unspecified                Time: 4098-1191 OT Time Calculation (min): 45 min Charges:  OT General Charges $OT Visit: 1 Visit OT Evaluation $OT Eval Moderate Complexity: 1 Mod OT Treatments $Self Care/Home Management : 8-22 mins $Therapeutic Activity: 8-22 mins  01/21/2024  AB, OTR/L  Acute Rehabilitation Services  Office: [phone]   Jorene New 01/21/2024, 10:56 AM

## 2024-01-22 ENCOUNTER — Inpatient Hospital Stay (HOSPITAL_COMMUNITY)

## 2024-01-22 DIAGNOSIS — I674 Hypertensive encephalopathy: Secondary | ICD-10-CM | POA: Diagnosis not present

## 2024-01-22 DIAGNOSIS — E785 Hyperlipidemia, unspecified: Secondary | ICD-10-CM | POA: Diagnosis not present

## 2024-01-22 DIAGNOSIS — D869 Sarcoidosis, unspecified: Secondary | ICD-10-CM | POA: Diagnosis not present

## 2024-01-22 DIAGNOSIS — R569 Unspecified convulsions: Secondary | ICD-10-CM

## 2024-01-22 DIAGNOSIS — F444 Conversion disorder with motor symptom or deficit: Secondary | ICD-10-CM

## 2024-01-22 LAB — CBC
HCT: 44.7 % (ref 36.0–46.0)
Hemoglobin: 13.7 g/dL (ref 12.0–15.0)
MCH: 22.1 pg — ABNORMAL LOW (ref 26.0–34.0)
MCHC: 30.6 g/dL (ref 30.0–36.0)
MCV: 72.2 fL — ABNORMAL LOW (ref 80.0–100.0)
Platelets: 393 10*3/uL (ref 150–400)
RBC: 6.19 MIL/uL — ABNORMAL HIGH (ref 3.87–5.11)
RDW: 16.6 % — ABNORMAL HIGH (ref 11.5–15.5)
WBC: 5.2 10*3/uL (ref 4.0–10.5)
nRBC: 0 % (ref 0.0–0.2)

## 2024-01-22 LAB — CREATININE, SERUM
Creatinine, Ser: 0.84 mg/dL (ref 0.44–1.00)
GFR, Estimated: >60 mL/min (ref >60)

## 2024-01-22 MED ORDER — ENOXAPARIN SODIUM 40 MG/0.4ML IJ SOSY
40.0000 mg | PREFILLED_SYRINGE | INTRAMUSCULAR | Status: DC
  Administered 2024-01-22 – 2024-01-24 (×3): 40 mg via SUBCUTANEOUS
  Filled 2024-01-22 (×3): qty 0.4

## 2024-01-22 NOTE — Progress Notes (Signed)
 EEG complete - results pending

## 2024-01-22 NOTE — Progress Notes (Signed)
 Occupational Therapy Treatment Patient Details Name: Jackie Reed MRN: [id number] DOB: [birth year] Today's Date: 01/22/2024   History of present illness Pt is a 58 yo female admitted to [facility] on 01/19/24 for acute onset L sided weakness. She received TNK at [facility], MRI was negative for acute infarcts. PMH of  sarcoidosis, latent TB, migraines and prediabetes.   OT comments  Pt. Seen for skilled OT treatment session.  Pt. Able to complete bed mobility with MIN A.  Ambulation to b.room. slow with cues for rw management and initiation of steps. Long delays noted with and in between each step.  Ataxia with initial attempt at movement of LUE but pt. Guides the extremity to hold RW/grab bars/ arm rests.  Several grooming tasks completed in standing and seated at sink.  LB dressing of socks with MIN A.  Pt. Incorporating use of L hand without cues and does a good job using B hands together during tasks.  Pt. Motivated for participation and continued progress with mobility and ADLs.  Remains excellent candidate for >3hrs/day continued therapies.        If plan is discharge home, recommend the following:  A little help with walking and/or transfers;A lot of help with bathing/dressing/bathroom;Assistance with cooking/housework;Assist for transportation   Equipment Recommendations  Other (comment);Tub/shower seat    Recommendations for Other Services Rehab consult    Precautions / Restrictions Precautions Precautions: Fall Recall of Precautions/Restrictions: Intact       Mobility Bed Mobility Overal bed mobility: Needs Assistance Bed Mobility: Supine to Sit     Supine to sit: Min assist          Transfers Overall transfer level: Needs assistance Equipment used: Rolling walker (2 wheels) Transfers: Sit to/from Stand, Bed to chair/wheelchair/BSC Sit to Stand: Min assist, Mod assist     Step pivot transfers: Min assist     General transfer comment: mod a to boost into standing.      Balance                                           ADL either performed or assessed with clinical judgement   ADL Overall ADL's : Needs assistance/impaired     Grooming: Wash/dry hands;Wash/dry face;Oral care;Sitting;Standing;Minimal assistance Grooming Details (indicate cue type and reason): pt. able to stand for washing hands, guiding L hand and allowing it to reduce to minimal ataxic movements then guiding R hand to help wash it, pt. able to also take L hand and guide it to wash R also.  washed face primarily with R hand.  for oral care pt. performed in sitting due to fatigue from standing.  utilized basin, and cup for rinsing.               Lower Body Dressing Details (indicate cue type and reason): MIN A don bilat socks, LLE ataxia and weakness -started in a modified long sitting and then scooted towards eob and had LLE bent sideways at the knee and was able to hold sock with B hands and hook over great toe then guide sock on slowly with B hands Toilet Transfer: Minimal assistance;Rolling walker (2 wheels);Grab bars;BSC/3in1;Regular Toilet;Ambulation;Cueing for sequencing;Cueing for safety Toilet Transfer Details (indicate cue type and reason): ambulated from eob into b.room. cues for hand placement L hand on grab bar, R hand on armrest then able to sit slow and controlled. Toileting- Clothing  Manipulation and Hygiene: Moderate assistance;Sit to/from stand       Functional mobility during ADLs: Minimal assistance;Rolling walker (2 wheels)      Extremity/Trunk Assessment              Vision       Perception     Praxis     Communication Communication Communication: No apparent difficulties   Cognition Arousal: Alert Behavior During Therapy: WFL for tasks assessed/performed Cognition: No apparent impairments                               Following commands: Intact        Cueing      Exercises      Shoulder Instructions        General Comments      Pertinent Vitals/ Pain       Pain Assessment Pain Assessment: No/denies pain  Home Living                                          Prior Functioning/Environment              Frequency  Min 2X/week        Progress Toward Goals  OT Goals(current goals can now be found in the care plan section)  Progress towards OT goals: Progressing toward goals  ADL Goals Pt Will Perform Grooming: with modified independence;standing Pt Will Perform Lower Body Bathing: with set-up;sitting/lateral leans Pt Will Perform Upper Body Dressing: with set-up;sitting Pt Will Perform Lower Body Dressing: with set-up;sit to/from stand;with supervision Pt Will Transfer to Toilet: with supervision;ambulating Additional ADL Goal #1: Pt will  Plan      Co-evaluation                 AM-PAC OT "6 Clicks" Daily Activity     Outcome Measure   Help from another person eating meals?: A Little Help from another person taking care of personal grooming?: A Little Help from another person toileting, which includes using toliet, bedpan, or urinal?: A Lot Help from another person bathing (including washing, rinsing, drying)?: A Lot Help from another person to put on and taking off regular upper body clothing?: A Lot Help from another person to put on and taking off regular lower body clothing?: A Lot 6 Click Score: 14    End of Session Equipment Utilized During Treatment: Gait belt;Rolling walker (2 wheels)  OT Visit Diagnosis: Unsteadiness on feet (R26.81);Other abnormalities of gait and mobility (R26.89);Ataxia, unspecified (R27.0);Muscle weakness (generalized) (M62.81);Hemiplegia and hemiparesis Hemiplegia - Right/Left: Left Hemiplegia - dominant/non-dominant: Dominant Hemiplegia - caused by: Unspecified   Activity Tolerance Patient tolerated treatment well   Patient Left in chair;with call bell/phone within reach   Nurse Communication Other  (comment) (rn states ok to work with pt., updated cna at end of session need for new pure wik placement)        Time: 1610-9604 OT Time Calculation (min): 37 min  Charges: OT General Charges $OT Visit: 1 Visit OT Treatments $Self Care/Home Management : 23-37 mins  Howell Macintosh, COTA/L Acute Rehabilitation [phone]   Leory Rands [place]-COTA/L  01/22/2024, 10:40 AM

## 2024-01-22 NOTE — Plan of Care (Signed)
  Problem: Clinical Measurements: Goal: Ability to maintain clinical measurements within normal limits will improve Outcome: Progressing Goal: Will remain free from infection Outcome: Progressing Goal: Diagnostic test results will improve Outcome: Progressing Goal: Respiratory complications will improve Outcome: Progressing Goal: Cardiovascular complication will be avoided Outcome: Progressing   Problem: Nutrition: Goal: Adequate nutrition will be maintained Outcome: Progressing   Problem: Activity: Goal: Risk for activity intolerance will decrease Outcome: Progressing   Problem: Elimination: Goal: Will not experience complications related to bowel motility Outcome: Progressing Goal: Will not experience complications related to urinary retention Outcome: Progressing   Problem: Safety: Goal: Ability to remain free from injury will improve Outcome: Progressing   Problem: Pain Managment: Goal: General experience of comfort will improve and/or be controlled Outcome: Progressing   Problem: Skin Integrity: Goal: Risk for impaired skin integrity will decrease Outcome: Progressing

## 2024-01-22 NOTE — Progress Notes (Signed)
 STROKE TEAM PROGRESS NOTE    SIGNIFICANT HOSPITAL EVENTS 5/28-patient admitted and given TNK for left-sided weakness  INTERIM HISTORY/SUBJECTIVE  No issues overnight.  Still has left arm leg weakness.  Sometimes she has spasms and shaking of her left upper extremity that comes on intermittently.  She denies any facial numbness or weakness.  No neck pain.  Will get EEG and cervical spine MRI to ensure there is no cervical spine pathology leading to her weakness.  OBJECTIVE  CBC    Component Value Date/Time   WBC 4.5 01/20/2024 0502   RBC 5.30 (H) 01/20/2024 0502   HGB 11.8 (L) 01/20/2024 0502   HGB 12.0 07/28/2023 1017   HCT 37.8 01/20/2024 0502   HCT 40.9 07/28/2023 1017   PLT 368 01/20/2024 0502   PLT 385 07/28/2023 1017   MCV 71.3 (L) 01/20/2024 0502   MCV 74 (L) 07/28/2023 1017   MCH 22.3 (L) 01/20/2024 0502   MCHC 31.2 01/20/2024 0502   RDW 15.8 (H) 01/20/2024 0502   RDW 15.3 07/28/2023 1017   LYMPHSABS 1.4 01/19/2024 1514   LYMPHSABS 1.0 07/28/2023 1017   MONOABS 0.8 01/19/2024 1514   EOSABS 0.3 01/19/2024 1514   EOSABS 0.2 07/28/2023 1017   BASOSABS 0.0 01/19/2024 1514   BASOSABS 0.0 07/28/2023 1017    BMET    Component Value Date/Time   NA 138 01/20/2024 0502   NA 139 07/28/2023 1017   K 3.7 01/20/2024 0502   CL 106 01/20/2024 0502   CO2 24 01/20/2024 0502   GLUCOSE 100 (H) 01/20/2024 0502   BUN 11 01/20/2024 0502   BUN 13 07/28/2023 1017   CREATININE 0.78 01/20/2024 0502   CREATININE 0.64 01/20/2023 1424   CALCIUM  9.0 01/20/2024 0502   EGFR 92 07/28/2023 1017   GFRNONAA >60 01/20/2024 0502   GFRNONAA >60 01/20/2023 1424    IMAGING past 24 hours No results found.   Vitals:   01/22/24 0039 01/22/24 0321 01/22/24 0827 01/22/24 0937  BP: 108/66 (!) 106/53  (!) 129/92  Pulse: 83 82    Resp:    15  Temp: 98.3 F (36.8 C) 98.2 F (36.8 C)  97.7 F (36.5 C)  TempSrc: Oral Oral  Oral  SpO2: 94% 95% 98% 96%  Weight:      Height:          PHYSICAL EXAM General:  Alert, well-nourished, well-developed patient in no acute distress Psych:  Mood and affect appropriate for situation CV: Regular rate and rhythm on monitor Respiratory:  Regular, unlabored respirations on room air GI: Abdomen soft and nontender   NEURO:  Mental Status: AA&Ox3, patient is able to give clear and coherent history Speech/Language: speech is without dysarthria or aphasia.    Cranial Nerves:  II: PERRL. Visual fields full.  III, IV, VI: EOMI. Eyelids elevate symmetrically.  V: Sensation is intact to light touch and symmetrical to face.  VII: Face is symmetrical resting and smiling VIII: hearing intact to voice. IX, X: Palate elevates symmetrically. Phonation is normal.  RU:[id number] shrug weaker on the left XII: tongue is midline without fasciculations. Motor: 5/5 strength to right upper and lower extremities, 4/5 strength to left upper extremity and 3/5 strength to left lower extremity with giveaway weakness with positive hoover's sign Tone: is normal and bulk is normal Sensation- Intact to light touch bilaterally.  Coordination: FTN intact on the right, some ataxia on the left Gait- deferred  Most Recent NIH  1a Level of Conscious.: 0 1b LOC  Questions: 0 1c LOC Commands: 0 2 Best Gaze: 0 3 Visual: 0 4 Facial Palsy: 0 5a Motor Arm - left: 1 5b Motor Arm - Right: 0 6a Motor Leg - Left: 2 6b Motor Leg - Right: 0 7 Limb Ataxia: 1 8 Sensory: 0 9 Best Language: 0 10 Dysarthria: 0 11 Extinct. and Inatten.: 0 TOTAL: 4   ASSESSMENT/PLAN  [name] is a 58 y.o. female with history of, sarcoidosis, latent TB, migraines and prediabetes admitted for acute onset left arm and leg weakness.  Patient received TNK at [facility] with some delays in administration due to hypertension and was transferred here for post TNK care.  MRI was negative for acute infarct, but some left-sided weakness remains.  NIH on Admission 5  Strokelike  episode status post TNK Concerning for hypertensive encephalopathy versus conversion disorder Patient had significant left upper and lower extremity giveaway weakness and positive Hoover signs Code Stroke CT head No acute abnormality. Small vessel disease. ASPECTS 10.    CTA head & neck no LVO or hemodynamically significant stenosis MRI no acute abnormality, chronic small vessel ischemic disease and volume loss CT Head 24 hr- no acute intracranial abnormality  2D Echo EF 60 to 65% LDL 84 HgbA1c 5.5 UDS negative VTE prophylaxis -SCDs No antithrombotic prior to admission, now on ASA 81mg . Continue on discharge Therapy recommendations:  CIR Disposition: Pending  Hypertensive encephalopathy Home meds: Hydrochlorothiazide  25 mg daily Extremely high BP on presentation Currently stable on high end Resume home HCTZ 25 Add amlodipine  10 Long-term BP goal normotensive  Hyperlipidemia Home meds: None LDL 84, goal < 70 Add rosuvastatin  20 mg daily Continue statin at discharge  Other Stroke Risk Factors Obesity, Body mass index is 37.69 kg/m., BMI >/= 30 associated with increased stroke risk, recommend weight loss, diet and exercise as appropriate  Migraine  Other Active Problems Latent TB-continue home isoniazid  and pyridoxine  (of note, patient was taking this medication as needed at home, will need counseling on proper administration of this medication) Sarcoidosis, follow-up with pulmonary, not on steroids  Hospital day # 3  She appears to be having left upper extremity spasms/tremors concerning for possible focal motor seizure.  Will get EEG to rule this out.  Also with left arm leg weakness and no facial involvement MRI of cervical spine is reasonable to ensure there is no spinal stroke noted.  If this is negative this is likely possible conversion disorder or MRI negative stroke.  On exam she is weak in the left upper and lower extremity.  Sensory is normal.  Continue PT OT and  possible rehab.   Total of 35 mins spent reviewing chart, discussion with patient and family on prognosis, Dx and plan. Discussed case with patient's nurse. Reviewed Imaging personally.      To contact Stroke Continuity provider, please refer to [email or web address]. After hours, contact General Neurology

## 2024-01-22 NOTE — Procedures (Signed)
 Patient Name: Jackie Reed  MRN: [id number]  Epilepsy Attending: Arleene Lack  Referring Physician/Provider: Donalynn Fry, MD  Date: 01/22/2024 Duration: 22.50 mins  Patient history: 58yo F with left sided weakness. EEG to evaluate for seizure  Level of alertness: Awake, asleep  AEDs during EEG study: None  Technical aspects: This EEG study was done with scalp electrodes positioned according to the 10-20 International system of electrode placement. Electrical activity was reviewed with band pass filter of 1-70Hz , sensitivity of 7 uV/mm, display speed of 26mm/sec with a 60Hz  notched filter applied as appropriate. EEG data were recorded continuously and digitally stored.  Video monitoring was available and reviewed as appropriate.  Description: No clear posterior dominant rhythm was seen. Sleep was characterized by vertex waves, sleep spindles (12 to 14 Hz), maximal frontocentral region. There is an excessive amount of 15 to 18 Hz beta activity distributed symmetrically and diffusely.  Physiologic photic driving was seen during photic stimulation.  Hyperventilation  was not performed.     ABNORMALITY - Excessive beta, generalized  IMPRESSION: This study is within normal limits. The excessive beta activity seen in the background is most likely due to the effect of benzodiazepine and is a benign EEG pattern. No seizures or epileptiform discharges were seen throughout the recording.  A normal interictal EEG does not exclude the diagnosis of epilepsy.   Faydra Korman O Tiffiney Sparrow

## 2024-01-22 NOTE — Progress Notes (Signed)
 Physical Therapy Treatment Patient Details Name: Jackie Reed MRN: [id number] DOB: December 16, 1965 Today's Date: 01/22/2024   History of Present Illness Pt is a 58 yo female admitted to [facility] on 01/19/24 for acute onset L sided weakness. She received TNK at [facility], MRI was negative for acute infarcts. PMH of  sarcoidosis, latent TB, migraines and prediabetes.    PT Comments  Pt admitted with above diagnosis. Pt progressed distance today. Still needs assist and cues for safety.  Max cues to sequence steps and RW with slow gait pattern.  Continue acute PT.  Pt currently with functional limitations due to the deficits listed below (see PT Problem List). Pt will benefit from acute skilled PT to increase their independence and safety with mobility to allow discharge.       If plan is discharge home, recommend the following: A little help with walking and/or transfers;Assistance with cooking/housework;Assist for transportation;Help with stairs or ramp for entrance   Can travel by private vehicle        Equipment Recommendations  Rolling walker (2 wheels);BSC/3in1    Recommendations for Other Services Rehab consult     Precautions / Restrictions Precautions Precautions: Fall Recall of Precautions/Restrictions: Intact Restrictions Weight Bearing Restrictions Per Provider Order: No     Mobility  Bed Mobility               General bed mobility comments: Pt in recliner on arrival and departure    Transfers Overall transfer level: Needs assistance Equipment used: Rolling walker (2 wheels) Transfers: Sit to/from Stand, Bed to chair/wheelchair/BSC Sit to Stand: Min assist, +2 safety/equipment           General transfer comment: min a to boost into standing with second person for safety with pt taking incr time to rise    Ambulation/Gait Ambulation/Gait assistance: Min assist, +2 safety/equipment Gait Distance (Feet): 75 Feet Assistive device: Rolling walker (2 wheels) Gait  Pattern/deviations: Step-to pattern, Ataxic, Decreased step length - left, Decreased stance time - left Gait velocity: decreased Gait velocity interpretation: <1.31 ft/sec, indicative of household ambulator   General Gait Details: Very slow gait with poor floor clearance, step to gait pattern with decreased step length and stance time on the L with intermittent ataxic movements of the LLE with poor progression anteriorly.  Pt with tremor at times in left LE and UE which worsened the longer she ambulated.  Followed pt with chair and after 75 feet, pt wanted to rest.   Stairs             Wheelchair Mobility     Tilt Bed    Modified Rankin (Stroke Patients Only)       Balance           Standing balance support: Reliant on assistive device for balance, Bilateral upper extremity supported, During functional activity Standing balance-Leahy Scale: Poor Standing balance comment: reliant on RW                            Communication Communication Communication: No apparent difficulties  Cognition Arousal: Alert Behavior During Therapy: WFL for tasks assessed/performed   PT - Cognitive impairments: No apparent impairments                         Following commands: Intact      Cueing Cueing Techniques: Verbal cues  Exercises General Exercises - Upper Extremity Shoulder Flexion: AROM, Both, 10 reps,  Seated Elbow Flexion: AROM, Both, 10 reps, Seated General Exercises - Lower Extremity Ankle Circles/Pumps: AROM, Both, 10 reps, Supine Quad Sets: AROM, Both, 10 reps, Supine Gluteal Sets: AROM, Both, 10 reps, Supine Long Arc Quad: AROM, Both, 10 reps, Seated Hip Flexion/Marching: AROM, Both, 5 reps, Seated    General Comments        Pertinent Vitals/Pain Pain Assessment Pain Assessment: No/denies pain    Home Living                          Prior Function            PT Goals (current goals can now be found in the care plan  section) Acute Rehab PT Goals Patient Stated Goal: to improve mobility and get back to baseline. Progress towards PT goals: Progressing toward goals    Frequency    Min 2X/week      PT Plan      Co-evaluation              AM-PAC PT "6 Clicks" Mobility   Outcome Measure  Help needed turning from your back to your side while in a flat bed without using bedrails?: A Little Help needed moving from lying on your back to sitting on the side of a flat bed without using bedrails?: A Little Help needed moving to and from a bed to a chair (including a wheelchair)?: A Little Help needed standing up from a chair using your arms (e.g., wheelchair or bedside chair)?: A Little Help needed to walk in hospital room?: Total Help needed climbing 3-5 steps with a railing? : A Lot 6 Click Score: 15    End of Session Equipment Utilized During Treatment: Gait belt Activity Tolerance: Patient tolerated treatment well;Patient limited by fatigue Patient left: in chair;with call bell/phone within reach;with chair alarm set Nurse Communication: Mobility status PT Visit Diagnosis: Unsteadiness on feet (R26.81);Other abnormalities of gait and mobility (R26.89)     Time: 1610-9604 PT Time Calculation (min) (ACUTE ONLY): 27 min  Charges:    $Gait Training: 8-22 mins $Therapeutic Exercise: 8-22 mins PT General Charges $$ ACUTE PT VISIT: 1 Visit                     Ady Heimann M,PT Acute Rehab Services [phone]    Jackie Reed 01/22/2024, 3:08 PM

## 2024-01-23 DIAGNOSIS — I674 Hypertensive encephalopathy: Secondary | ICD-10-CM | POA: Diagnosis not present

## 2024-01-23 DIAGNOSIS — E785 Hyperlipidemia, unspecified: Secondary | ICD-10-CM | POA: Diagnosis not present

## 2024-01-23 DIAGNOSIS — D869 Sarcoidosis, unspecified: Secondary | ICD-10-CM | POA: Diagnosis not present

## 2024-01-23 DIAGNOSIS — F444 Conversion disorder with motor symptom or deficit: Secondary | ICD-10-CM | POA: Diagnosis not present

## 2024-01-23 MED ORDER — MAGNESIUM CITRATE PO SOLN
1.0000 | Freq: Once | ORAL | Status: DC
  Filled 2024-01-23: qty 296

## 2024-01-23 MED ORDER — DOCUSATE SODIUM 100 MG PO CAPS
100.0000 mg | ORAL_CAPSULE | Freq: Two times a day (BID) | ORAL | Status: DC
  Administered 2024-01-23 – 2024-01-28 (×9): 100 mg via ORAL
  Filled 2024-01-23 (×11): qty 1

## 2024-01-23 MED ORDER — MAGNESIUM CITRATE PO SOLN
1.0000 | Freq: Once | ORAL | Status: AC
  Administered 2024-01-23: 1 via ORAL
  Filled 2024-01-23: qty 296

## 2024-01-23 NOTE — Progress Notes (Signed)
 STROKE TEAM PROGRESS NOTE    SIGNIFICANT HOSPITAL EVENTS 5/28-patient admitted and given TNK for left-sided weakness  INTERIM HISTORY/SUBJECTIVE Members from her church are visiting.  Patient is constipated.  Colace and mag citrate added today.  EEG and MRI C-spine negative    OBJECTIVE  CBC    Component Value Date/Time   WBC 5.2 01/22/2024 1228   RBC 6.19 (H) 01/22/2024 1228   HGB 13.7 01/22/2024 1228   HGB 12.0 07/28/2023 1017   HCT 44.7 01/22/2024 1228   HCT 40.9 07/28/2023 1017   PLT 393 01/22/2024 1228   PLT 385 07/28/2023 1017   MCV 72.2 (L) 01/22/2024 1228   MCV 74 (L) 07/28/2023 1017   MCH 22.1 (L) 01/22/2024 1228   MCHC 30.6 01/22/2024 1228   RDW 16.6 (H) 01/22/2024 1228   RDW 15.3 07/28/2023 1017   LYMPHSABS 1.4 01/19/2024 1514   LYMPHSABS 1.0 07/28/2023 1017   MONOABS 0.8 01/19/2024 1514   EOSABS 0.3 01/19/2024 1514   EOSABS 0.2 07/28/2023 1017   BASOSABS 0.0 01/19/2024 1514   BASOSABS 0.0 07/28/2023 1017    BMET    Component Value Date/Time   NA 138 01/20/2024 0502   NA 139 07/28/2023 1017   K 3.7 01/20/2024 0502   CL 106 01/20/2024 0502   CO2 24 01/20/2024 0502   GLUCOSE 100 (H) 01/20/2024 0502   BUN 11 01/20/2024 0502   BUN 13 07/28/2023 1017   CREATININE 0.84 01/22/2024 1228   CREATININE 0.64 01/20/2023 1424   CALCIUM  9.0 01/20/2024 0502   EGFR 92 07/28/2023 1017   GFRNONAA >60 01/22/2024 1228   GFRNONAA >60 01/20/2023 1424    IMAGING past 24 hours MR CERVICAL SPINE WO CONTRAST Result Date: 01/22/2024 CLINICAL DATA:  Initial evaluation for left upper extremity weakness. EXAM: MRI CERVICAL SPINE WITHOUT CONTRAST TECHNIQUE: Multiplanar, multisequence MR imaging of the cervical spine was performed. No intravenous contrast was administered. COMPARISON:  CT from 10/21/2018 FINDINGS: Alignment: Straightening of the normal cervical lordosis. No listhesis. Vertebrae: Vertebral body height maintained without acute or chronic fracture. Bone marrow  signal intensity within normal limits. No worrisome osseous lesions. No abnormal marrow edema. Cord: Normal signal and morphology. Posterior Fossa, vertebral arteries, paraspinal tissues: Unremarkable. Disc levels: C2-C3: Small central disc protrusion mildly indents the ventral thecal sac. No spinal stenosis. Foramina remain patent. C3-C4: Small left paracentral disc protrusion indents the ventral thecal sac. Minimal cord flattening without cord signal changes or significant spinal stenosis. Foramina remain patent. C4-C5: Small central to left paracentral disc protrusion indents the ventral thecal sac. Minimal cord flattening without cord signal changes or significant spinal stenosis. Foramina remain patent. C5-C6: Left paracentral disc protrusion indents and partially faces the ventral thecal sac. Mild flattening of the left hemi cord without cord signal changes or significant spinal stenosis. Foramina remain patent. C6-C7: Small right paracentral disc protrusion indents the right ventral thecal sac. Minimal cord flattening without cord signal changes or significant spinal stenosis. Foramina remain patent. C7-T1: Negative interspace. Mild left-sided facet hypertrophy. No canal or foraminal stenosis. IMPRESSION: 1. Small central to left paracentral disc protrusions at C2-3 through C5-6 as above. Secondary minor cord flattening without cord signal changes or significant spinal stenosis. Findings could contribute to left upper extremity symptoms. 2. Small right paracentral disc protrusion at C6-7 without significant stenosis. Electronically Signed   By: Virgia Griffins M.D.   On: 01/22/2024 18:07   EEG adult Result Date: 01/22/2024 Arleene Lack, MD     01/22/2024  1:06  PM Patient Name: Jackie Reed MRN: [id number] Epilepsy Attending: Arleene Lack Referring Physician/Provider: Donalynn Fry, MD Date: 01/22/2024 Duration: 22.50 mins Patient history: 58yo F with left sided weakness. EEG to evaluate for  seizure Level of alertness: Awake, asleep AEDs during EEG study: None Technical aspects: This EEG study was done with scalp electrodes positioned according to the 10-20 International system of electrode placement. Electrical activity was reviewed with band pass filter of 1-70Hz , sensitivity of 7 uV/mm, display speed of 61mm/sec with a 60Hz  notched filter applied as appropriate. EEG data were recorded continuously and digitally stored.  Video monitoring was available and reviewed as appropriate. Description: No clear posterior dominant rhythm was seen. Sleep was characterized by vertex waves, sleep spindles (12 to 14 Hz), maximal frontocentral region. There is an excessive amount of 15 to 18 Hz beta activity distributed symmetrically and diffusely.  Physiologic photic driving was seen during photic stimulation.  Hyperventilation  was not performed.   ABNORMALITY - Excessive beta, generalized IMPRESSION: This study is within normal limits. The excessive beta activity seen in the background is most likely due to the effect of benzodiazepine and is a benign EEG pattern. No seizures or epileptiform discharges were seen throughout the recording. A normal interictal EEG does not exclude the diagnosis of epilepsy. Arleene Lack     Vitals:   01/22/24 2015 01/22/24 2309 01/23/24 0436 01/23/24 0926  BP: 121/77 116/68 106/67 (!) 124/58  Pulse: 93 85 84 85  Resp: 18   20  Temp: 98.3 F (36.8 C) 98.5 F (36.9 C) 97.6 F (36.4 C) 97.7 F (36.5 C)  TempSrc: Oral Oral Oral Oral  SpO2: 98% 96% 97% 99%  Weight:      Height:         PHYSICAL EXAM General:  Alert, well-nourished, well-developed patient in no acute distress Psych:  Mood and affect appropriate for situation CV: Regular rate and rhythm on monitor Respiratory:  Regular, unlabored respirations on room air GI: Abdomen soft and nontender   NEURO:  Mental Status: AA&Ox3, patient is able to give clear and coherent history Speech/Language:  speech is without dysarthria or aphasia.    Cranial Nerves:  II: PERRL. Visual fields full.  III, IV, VI: EOMI. Eyelids elevate symmetrically.  V: Sensation is intact to light touch and symmetrical to face.  VII: Face is symmetrical resting and smiling VIII: hearing intact to voice. IX, X: Palate elevates symmetrically. Phonation is normal.  QM:VHQIONGE shrug weaker on the left XII: tongue is midline without fasciculations. Motor: 5/5 strength to right upper and lower extremities, 4/5 strength to left upper extremity and 3/5 strength to left lower extremity with giveaway weakness with positive hoover's sign Tone: is normal and bulk is normal Sensation- Intact to light touch bilaterally.  Coordination: FTN intact on the right, some ataxia on the left Gait- deferred  Most Recent NIH  1a Level of Conscious.: 0 1b LOC Questions: 0 1c LOC Commands: 0 2 Best Gaze: 0 3 Visual: 0 4 Facial Palsy: 0 5a Motor Arm - left: 1 5b Motor Arm - Right: 0 6a Motor Leg - Left: 2 6b Motor Leg - Right: 0 7 Limb Ataxia: 1 8 Sensory: 0 9 Best Language: 0 10 Dysarthria: 0 11 Extinct. and Inatten.: 0 TOTAL: 4   ASSESSMENT/PLAN  Jackie Reed is a 57 y.o. female with history of, sarcoidosis, latent TB, migraines and prediabetes admitted for acute onset left arm and leg weakness.  Patient received TNK  at [facility] with some delays in administration due to hypertension and was transferred here for post TNK care.  MRI was negative for acute infarct, but some left-sided weakness remains.  NIH on Admission 5  Strokelike episode status post TNK Concerning for hypertensive encephalopathy versus conversion disorder Patient had significant left upper and lower extremity giveaway weakness and positive Hoover signs Code Stroke CT head No acute abnormality. Small vessel disease. ASPECTS 10.    CTA head & neck no LVO or hemodynamically significant stenosis MRI no acute abnormality, chronic small vessel  ischemic disease and volume loss CT Head 24 hr- no acute intracranial abnormality  2D Echo EF 60 to 65% LDL 84 HgbA1c 5.5 UDS negative VTE prophylaxis -SCDs No antithrombotic prior to admission, now on ASA 81mg . Continue on discharge Therapy recommendations:  CIR Disposition: Pending  Hypertensive encephalopathy Home meds: Hydrochlorothiazide  25 mg daily Extremely high BP on presentation Currently stable on high end Resume home HCTZ 25 Add amlodipine  10 Long-term BP goal normotensive  Hyperlipidemia Home meds: None LDL 84, goal < 70  rosuvastatin  20 mg daily Continue statin at discharge  Other Stroke Risk Factors Obesity, Body mass index is 37.69 kg/m., BMI >/= 30 associated with increased stroke risk, recommend weight loss, diet and exercise as appropriate  Migraine  Other Active Problems Latent TB-continue home isoniazid  and pyridoxine  (of note, patient was taking this medication as needed at home, will need counseling on proper administration of this medication) Sarcoidosis, follow-up with pulmonary, not on steroids  Hospital day # 4  MRI negative stroke.  PT OT and possible CIR.  Bowel regimen added.     To contact Stroke Continuity provider, please refer to [email or web address]. After hours, contact General Neurology

## 2024-01-23 NOTE — Plan of Care (Signed)
  Problem: Clinical Measurements: Goal: Ability to maintain clinical measurements within normal limits will improve Outcome: Progressing Goal: Will remain free from infection Outcome: Progressing Goal: Diagnostic test results will improve Outcome: Progressing Goal: Respiratory complications will improve Outcome: Progressing Goal: Cardiovascular complication will be avoided Outcome: Progressing   Problem: Nutrition: Goal: Adequate nutrition will be maintained Outcome: Progressing   Problem: Activity: Goal: Risk for activity intolerance will decrease Outcome: Progressing   Problem: Pain Managment: Goal: General experience of comfort will improve and/or be controlled Outcome: Progressing   Problem: Elimination: Goal: Will not experience complications related to bowel motility Outcome: Progressing Goal: Will not experience complications related to urinary retention Outcome: Progressing   Problem: Safety: Goal: Ability to remain free from injury will improve Outcome: Progressing   Problem: Skin Integrity: Goal: Risk for impaired skin integrity will decrease Outcome: Progressing

## 2024-01-24 DIAGNOSIS — E785 Hyperlipidemia, unspecified: Secondary | ICD-10-CM | POA: Diagnosis not present

## 2024-01-24 DIAGNOSIS — I674 Hypertensive encephalopathy: Secondary | ICD-10-CM | POA: Diagnosis not present

## 2024-01-24 DIAGNOSIS — R2990 Unspecified symptoms and signs involving the nervous system: Secondary | ICD-10-CM | POA: Diagnosis not present

## 2024-01-24 DIAGNOSIS — F449 Dissociative and conversion disorder, unspecified: Secondary | ICD-10-CM | POA: Diagnosis not present

## 2024-01-24 MED ORDER — ISONIAZID 300 MG PO TABS
300.0000 mg | ORAL_TABLET | Freq: Every morning | ORAL | Status: DC
  Administered 2024-01-25 – 2024-01-28 (×4): 300 mg via ORAL
  Filled 2024-01-24 (×4): qty 1

## 2024-01-24 MED ORDER — ENOXAPARIN SODIUM 60 MG/0.6ML IJ SOSY
50.0000 mg | PREFILLED_SYRINGE | INTRAMUSCULAR | Status: DC
  Administered 2024-01-25 – 2024-01-28 (×4): 50 mg via SUBCUTANEOUS
  Filled 2024-01-24 (×4): qty 0.6

## 2024-01-24 NOTE — Plan of Care (Signed)
  Problem: Education: Goal: Knowledge of General Education information will improve Description: Including pain rating scale, medication(s)/side effects and non-pharmacologic comfort measures Outcome: Progressing   Problem: Health Behavior/Discharge Planning: Goal: Ability to manage health-related needs will improve Outcome: Progressing   Problem: Clinical Measurements: Goal: Ability to maintain clinical measurements within normal limits will improve Outcome: Progressing Goal: Will remain free from infection Outcome: Progressing Goal: Diagnostic test results will improve Outcome: Progressing   Problem: Nutrition: Goal: Adequate nutrition will be maintained Outcome: Progressing   Problem: Pain Managment: Goal: General experience of comfort will improve and/or be controlled Outcome: Progressing   Problem: Safety: Goal: Ability to remain free from injury will improve Outcome: Progressing   Problem: Skin Integrity: Goal: Risk for impaired skin integrity will decrease Outcome: Progressing   Problem: Coping: Goal: Level of anxiety will decrease Outcome: Not Progressing

## 2024-01-24 NOTE — Discharge Instructions (Signed)
 Isoniazid  should be taken daily and administered on an empty stomach (1 hour before meals or 2 hours after a meal with water) in order to ensure there is adequate absorption of the medication. Take with pyridoxine  (Vitamin B6 daily) to reduce the risk of side effects.

## 2024-01-24 NOTE — Progress Notes (Signed)
 Occupational Therapy Treatment Patient Details Name: Jackie Reed MRN: [id number] DOB: [birth year] Today's Date: 01/24/2024   History of present illness Pt is a 58 yo female admitted to [facility] on 01/19/24 for acute onset L sided weakness. She received TNK at [facility], MRI was negative for acute infarcts EEG with no acute findings. PMH of  sarcoidosis, latent TB, migraines and prediabetes.   OT comments  Patient making steady progress towards goals. Patient session focus on cognition and further assessment for functional independence. Patient scoring a 2/5 on Mini-Cog, indicative of cognitive impairments, and then 4/5 on Medi-Cog however needing significant time to complete instruction set #2 (upwards of 5 minutes and reading the prompt each time to fill in correctly for every day of the week). Patient receptive to a Journalist, newspaper as well as placing an alarm on her phone to improve medication management. Patient does demonstrate awareness that she has not been taking her medications appropriately, leading to this hospital stay. Patient continues to be an excellent candidate for intensive rehab >3 hours. OT will continue to follow.       If plan is discharge home, recommend the following:  A little help with walking and/or transfers;A lot of help with bathing/dressing/bathroom;Assistance with cooking/housework;Assist for transportation   Equipment Recommendations  Other (comment);Tub/shower seat (RW)    Recommendations for Other Services      Precautions / Restrictions Precautions Precautions: Fall Recall of Precautions/Restrictions: Intact Restrictions Weight Bearing Restrictions Per Provider Order: No       Mobility Bed Mobility               General bed mobility comments: Pt in recliner on arrival and departure    Transfers                   General transfer comment: did not assess     Balance                                           ADL either  performed or assessed with clinical judgement   ADL Overall ADL's : Needs assistance/impaired                                     Functional mobility during ADLs: Minimal assistance;Rolling walker (2 wheels) General ADL Comments: Patient making steady progress towards goals. Patient session focus on cognition and further assessment for functional independence. Patient scoring a 2/5 on Mini-Cog, indicative of cognitive impairments, and then 4/5 on Medi-Cog however needing significant time to complete instruction set #2 (upwards of 5 minutes and reading the prompt each time to fill in correctly for every day of the week). Patient receptive to a Journalist, newspaper as well as placing an alarm on her phone to improve medication management. Patient does demonstrate awareness that she has not been taking her medications appropriately, leading to this hospital stay. Patient continues to be an excellent candidate for intensive rehab >3 hours. OT will continue to follow.    Extremity/Trunk Assessment Upper Extremity Assessment Upper Extremity Assessment: LUE deficits/detail LUE Deficits / Details: continued ataxic movements, appears to exacerbate when stressed or overwhelmed LUE Sensation: WNL LUE Coordination: decreased gross motor;decreased fine motor            Vision       Perception  Praxis     Communication Communication Communication: No apparent difficulties   Cognition Arousal: Alert Behavior During Therapy: WFL for tasks assessed/performed Cognition: Cognition impaired     Awareness: Intellectual awareness intact, Online awareness impaired Memory impairment (select all impairments): Short-term memory Attention impairment (select first level of impairment): Selective attention Executive functioning impairment (select all impairments): Problem solving, Reasoning OT - Cognition Comments: Unable to complete 3 word recall, able to complete clock draw, increased  time and errors to complete Medi-Cog assessment                 Following commands: Intact        Cueing   Cueing Techniques: Verbal cues  Exercises      Shoulder Instructions       General Comments VSS on RA    Pertinent Vitals/ Pain       Pain Assessment Pain Assessment: No/denies pain  Home Living                                          Prior Functioning/Environment              Frequency  Min 2X/week        Progress Toward Goals  OT Goals(current goals can now be found in the care plan section)  Progress towards OT goals: Progressing toward goals  Acute Rehab OT Goals Patient Stated Goal: to get to rehab OT Goal Formulation: With patient Time For Goal Achievement: 02/04/24 Potential to Achieve Goals: Good  Plan      Co-evaluation                 AM-PAC OT "6 Clicks" Daily Activity     Outcome Measure   Help from another person eating meals?: A Little Help from another person taking care of personal grooming?: A Little Help from another person toileting, which includes using toliet, bedpan, or urinal?: A Lot Help from another person bathing (including washing, rinsing, drying)?: A Lot Help from another person to put on and taking off regular upper body clothing?: A Little Help from another person to put on and taking off regular lower body clothing?: A Lot 6 Click Score: 15    End of Session    OT Visit Diagnosis: Unsteadiness on feet (R26.81);Other abnormalities of gait and mobility (R26.89);Ataxia, unspecified (R27.0);Muscle weakness (generalized) (M62.81);Hemiplegia and hemiparesis Hemiplegia - Right/Left: Left Hemiplegia - dominant/non-dominant: Dominant Hemiplegia - caused by: Unspecified   Activity Tolerance Patient tolerated treatment well   Patient Left in chair;with call bell/phone within reach   Nurse Communication Mobility status        Time: 8657-8469 OT Time Calculation (min): 29  min  Charges: OT General Charges $OT Visit: 1 Visit OT Treatments $Self Care/Home Management : 23-37 mins  Mollie Anger E. Laurina Fischl, OTR/L Acute Rehabilitation Services [phone]   Vincent Greek 01/24/2024, 2:15 PM

## 2024-01-24 NOTE — Progress Notes (Addendum)
 STROKE TEAM PROGRESS NOTE    SIGNIFICANT HOSPITAL EVENTS 5/28-patient admitted and given TNK for left-sided weakness  INTERIM HISTORY/SUBJECTIVE Patient has remained hemodynamically stable and afebrile overnight.  Her neurological exam is stable, and she is awaiting placement in CIR. patient continues to have nonorganic features with overlying variable expiratory testing and left-sided strength with giveaway weakness OBJECTIVE  CBC    Component Value Date/Time   WBC 5.2 01/22/2024 1228   RBC 6.19 (H) 01/22/2024 1228   HGB 13.7 01/22/2024 1228   HGB 12.0 07/28/2023 1017   HCT 44.7 01/22/2024 1228   HCT 40.9 07/28/2023 1017   PLT 393 01/22/2024 1228   PLT 385 07/28/2023 1017   MCV 72.2 (L) 01/22/2024 1228   MCV 74 (L) 07/28/2023 1017   MCH 22.1 (L) 01/22/2024 1228   MCHC 30.6 01/22/2024 1228   RDW 16.6 (H) 01/22/2024 1228   RDW 15.3 07/28/2023 1017   LYMPHSABS 1.4 01/19/2024 1514   LYMPHSABS 1.0 07/28/2023 1017   MONOABS 0.8 01/19/2024 1514   EOSABS 0.3 01/19/2024 1514   EOSABS 0.2 07/28/2023 1017   BASOSABS 0.0 01/19/2024 1514   BASOSABS 0.0 07/28/2023 1017    BMET    Component Value Date/Time   NA 138 01/20/2024 0502   NA 139 07/28/2023 1017   K 3.7 01/20/2024 0502   CL 106 01/20/2024 0502   CO2 24 01/20/2024 0502   GLUCOSE 100 (H) 01/20/2024 0502   BUN 11 01/20/2024 0502   BUN 13 07/28/2023 1017   CREATININE 0.84 01/22/2024 1228   CREATININE 0.64 01/20/2023 1424   CALCIUM  9.0 01/20/2024 0502   EGFR 92 07/28/2023 1017   GFRNONAA >60 01/22/2024 1228   GFRNONAA >60 01/20/2023 1424    IMAGING past 24 hours No results found.    Vitals:   01/24/24 0323 01/24/24 0346 01/24/24 0809 01/24/24 1228  BP: 112/68 113/71 134/66 (!) 140/89  Pulse:  77 98 94  Resp:  14 17 (!) 24  Temp:  97.9 F (36.6 C) 97.9 F (36.6 C) 97.8 F (36.6 C)  TempSrc:  Oral Oral Oral  SpO2:  97% 99% 99%  Weight:      Height:         PHYSICAL EXAM General:  Alert,  well-nourished, well-developed patient in no acute distress Psych:  Mood and affect appropriate for situation CV: Regular rate and rhythm on monitor Respiratory:  Regular, unlabored respirations on room air  NEURO:  Mental Status: AA&Ox3, patient is able to give clear and coherent history Speech/Language: speech is without dysarthria or aphasia.    Cranial Nerves:  II: PERRL. Visual fields full.  III, IV, VI: EOMI. Eyelids elevate symmetrically.  V: Sensation is intact to light touch and symmetrical to face.  VII: Face is symmetrical resting and smiling VIII: hearing intact to voice. IX, X: Phonation is normal.  XII: tongue is midline without fasciculations. Motor: 5/5 strength to right upper and lower extremities, 4/5 strength to left upper extremity and 3/5 strength to left lower extremity with giveaway weakness with positive hoover's sign, drift in left arm and leg.  Giveaway weakness with poor and variable effort on the left Tone: is normal and bulk is normal Sensation- Intact to light touch bilaterally.  Gait- deferred  Most Recent NIH  1a Level of Conscious.: 0 1b LOC Questions: 0 1c LOC Commands: 0 2 Best Gaze: 0 3 Visual: 0 4 Facial Palsy: 0 5a Motor Arm - left: 1 5b Motor Arm - Right: 0 6a Motor  Leg - Left: 2 6b Motor Leg - Right: 0 7 Limb Ataxia: 1 8 Sensory: 0 9 Best Language: 0 10 Dysarthria: 0 11 Extinct. and Inatten.: 0 TOTAL: 4   ASSESSMENT/PLAN  Jackie Reed is a 58 y.o. female with history of, sarcoidosis, latent TB, migraines and prediabetes admitted for acute onset left arm and leg weakness.  Patient received TNK at [facility] with some delays in administration due to hypertension and was transferred here for post TNK care.  MRI was negative for acute infarct, but some left-sided weakness remains.  NIH on Admission 5  Strokelike episode status post TNK Concerning for hypertensive encephalopathy versus conversion disorder Patient had significant  left upper and lower extremity giveaway weakness and positive Hoover signs Code Stroke CT head No acute abnormality. Small vessel disease. ASPECTS 10.    CTA head & neck no LVO or hemodynamically significant stenosis MRI no acute abnormality, chronic small vessel ischemic disease and volume loss CT Head 24 hr- no acute intracranial abnormality  2D Echo EF 60 to 65% LDL 84 HgbA1c 5.5 UDS negative VTE prophylaxis -SCDs No antithrombotic prior to admission, now on ASA 81mg . Continue on discharge Therapy recommendations:  CIR Disposition: Pending  Hypertensive encephalopathy Home meds: Hydrochlorothiazide  25 mg daily Extremely high BP on presentation Currently stable on high end Resume home HCTZ 25 Add amlodipine  10 Long-term BP goal normotensive  Hyperlipidemia Home meds: None LDL 84, goal < 70  rosuvastatin  20 mg daily Continue statin at discharge  Other Stroke Risk Factors Obesity, Body mass index is 37.69 kg/m., BMI >/= 30 associated with increased stroke risk, recommend weight loss, diet and exercise as appropriate  Migraine  Other Active Problems Latent TB-continue home isoniazid  and pyridoxine  (of note, patient was taking this medication as needed at home, will need counseling on proper administration of this medication) Sarcoidosis, follow-up with pulmonary, not on steroids  Hospital day # 5  Patient seen by NP with MD, MD to edit note as needed. Jackie Reed , MSN, AGACNP-BC [facility] See Amion for schedule and pager information 01/24/2024 1:03 PM   I have personally obtained history,examined this patient, reviewed notes, independently viewed imaging studies, participated in medical decision making and plan of care.ROS completed by me personally and pertinent positives fully documented  I have made any additions or clarifications directly to the above note. Agree with note above.  Patient continues to have subjective left-sided weakness with  poor, variable effort and giveaway weakness.  Continue to mobilize out of bed.  Therapy consults.  Transfer to inpatient rehab when bed available.  Medically stable to be discharged to rehab.  Greater than 50% time during this 35-minute visit was spent in counseling and coordination of care and discussion with patient and care team and answering questions  Jackie Beaver, MD Medical Director [facility] Pager: [phone] 01/24/2024 2:16 PM

## 2024-01-24 NOTE — Consult Note (Signed)
 Physical Medicine and Rehabilitation Consult Reason for Consult: evaluate for inpatient appropriateness Referring Physician: Stroke Team   HPI: Jackie Reed is a 58 y.o. female with PMH of HTN, sarcoidosis, latent TB undergoing treatment, who presented to [facility] with sudden onset left arm and leg weakness on 5/28. She was noted to be extremely hypertensive with systolics >220s and received labetolol and hydralazine  prior to administration of TNK. She was then transferred to [facility] ICU for monitoring and blood pressure control. MRI completed 5/29 which was negative for acute infarct.   She was noted to have left upper extremity tremors with concerns for focal motor seizure, however spot EEG was negative. With concern for possible cervical spine pathology contributing to weakness MRI was completed which showed minor cord flattening without cord signal changes or significant spinal stenosis.  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, Sports coach  Lives With: Spouse  Functional History: Prior Function Prior Level of Function : Independent/Modified Independent, Driving, Working/employed, History of Falls (last six months) Mobility Comments: ind ADLs Comments: ind Functional Status:  Mobility: Bed Mobility Overal bed mobility: Needs Assistance Bed Mobility: Supine to Sit Supine to sit: Min assist General bed mobility comments: Pt in  recliner on arrival and departure Transfers Overall transfer level: Needs assistance Equipment used: Rolling walker (2 wheels) Transfers: Sit to/from Stand, Bed to chair/wheelchair/BSC Sit to Stand: Min assist, +2 safety/equipment Bed to/from chair/wheelchair/BSC transfer type:: Step pivot Step pivot transfers: Min assist General transfer comment: did not assess Ambulation/Gait Ambulation/Gait assistance: Min assist, +2 safety/equipment Gait Distance (Feet): 75 Feet Assistive device: Rolling walker (2 wheels) Gait Pattern/deviations: Step-to pattern, Ataxic, Decreased step length - left, Decreased stance time - left General Gait Details: Very slow gait with poor floor clearance, step to gait pattern with decreased step length and stance time on the L with intermittent ataxic movements of the LLE with poor progression anteriorly.  Pt with tremor at times in left LE and UE which worsened the longer she ambulated.  Followed pt with chair and after 75 feet, pt wanted to rest. Gait velocity: decreased Gait velocity interpretation: <1.31 ft/sec, indicative of household ambulator    ADL: ADL Overall ADL's : Needs assistance/impaired Eating/Feeding: Sitting, Minimal assistance Eating/Feeding Details (indicate cue type and reason): min A, unable to hold utensils with dominant LUE. LUE ataxic, has been using RUE to eat- ordering simple hand held meals. Grooming: Wash/dry hands, Wash/dry face, Oral care, Sitting, Standing, Minimal assistance Grooming Details (indicate cue type and reason): pt. able to stand for washing hands, guiding L hand and allowing it to reduce to minimal ataxic movements then guiding R hand to help wash it, pt. able to also take L hand and guide it to wash R also.  washed face primarily with R hand.  for oral care pt. performed in sitting due to fatigue from standing.  utilized basin, and cup for rinsing. Upper Body Bathing: Sitting, Minimal assistance Lower Body Bathing:  Moderate assistance, Sitting/lateral leans Upper Body Dressing : Moderate assistance, Sitting Lower Body Dressing: Maximal assistance, Sitting/lateral  leans Lower Body Dressing Details (indicate cue type and reason): don bilat socks, educated pt on one handed dressing techniques. LLE ataxia and weakness now unable to obtain figure four position now-started in a modified long sitting and then scooted towards eob and had LLE bent sideways at the knee and was able to hold sock with B hands and hook over great toe then guide sock on slowly with B hands Toilet Transfer: Minimal assistance, Rolling walker (2 wheels), Grab bars, BSC/3in1, Regular Toilet, Ambulation, Cueing for sequencing, Cueing for safety Toilet Transfer Details (indicate cue type and reason): ambulated from eob into b.room. cues for hand placement L hand on grab bar, R hand on armrest then able to sit slow and controlled. Toileting- Clothing Manipulation and Hygiene: Moderate assistance, Sit to/from stand Functional mobility during ADLs: Minimal assistance, Rolling walker (2 wheels) General ADL Comments: Patient making steady progress towards goals. Patient session focus on cognition and further assessment for functional independence. Patient scoring a 2/5 on Mini-Cog, indicative of cognitive impairments, and then 4/5 on Medi-Cog however needing significant time to complete instruction set #2 (upwards of 5 minutes and reading the prompt each time to fill in correctly for every day of the week). Patient receptive to a Journalist, newspaper as well as placing an alarm on her phone to improve medication management. Patient does demonstrate awareness that she has not been taking her medications appropriately, leading to this hospital stay. Patient continues to be an excellent candidate for intensive rehab >3 hours. OT will continue to follow.  Cognition: Cognition Overall Cognitive Status: Impaired/Different from baseline Arousal/Alertness:  Awake/alert Orientation Level: Oriented X4 Attention: Sustained Sustained Attention: Impaired Sustained Attention Impairment: Verbal complex Memory: Appears intact Awareness: Appears intact Problem Solving: Impaired Problem Solving Impairment: Verbal complex Safety/Judgment: Appears intact Cognition Arousal: Alert Behavior During Therapy: WFL for tasks assessed/performed Overall Cognitive Status: Impaired/Different from baseline   Review of Systems  Constitutional:  Negative for chills and fever.  HENT:  Negative for ear discharge and nosebleeds.   Eyes:  Negative for discharge and redness.  Respiratory:  Negative for cough and hemoptysis.   Cardiovascular:  Negative for chest pain and palpitations.  Gastrointestinal:  Negative for abdominal pain, heartburn, nausea and vomiting.  Genitourinary:  Negative for flank pain.  Musculoskeletal:  Negative for joint pain and neck pain.  Skin:  Negative for rash.   Past Medical History:  Diagnosis Date   Dyspnea    occasional   E-coli UTI 08/2019   Hepatitis 05/2020   Hepatitis B core antibody positive   Hypertension    Migraine    Plantar fibromatosis    PONV (postoperative nausea and vomiting)    Pre-diabetes    Thyroid  nodule 01/29/2023   seen on PET scan   Vitamin B12 deficiency 06/2019   Vitamin D  deficiency 06/2019   Past Surgical History:  Procedure Laterality Date   ABDOMINAL HYSTERECTOMY     FINE NEEDLE ASPIRATION  02/19/2023   Procedure: FINE NEEDLE ASPIRATION (FNA) LINEAR;  Surgeon: Prudy Brownie, DO;  Location: [facility];  Service: Cardiopulmonary;;   FOOT SURGERY Bilateral 1985   MASS EXCISION Right 05/19/2018   Procedure: EXCISION PALMAR NODULES RIGHT HAND;  Surgeon: Brunilda Capra, MD;  Location: [facility];  Service: Orthopedics;  Laterality: Right;   PARTIAL HYSTERECTOMY  1997   VIDEO BRONCHOSCOPY WITH ENDOBRONCHIAL ULTRASOUND Bilateral 02/19/2023   Procedure: VIDEO BRONCHOSCOPY WITH  ENDOBRONCHIAL ULTRASOUND;  Surgeon: Prudy Brownie, DO;  Location: [facility];  Service:  Cardiopulmonary;  Laterality: Bilateral;   Family History  Problem Relation Age of Onset   Heart failure Mother    Diabetes Mother    Heart failure Father    Hypertension Father    Hyperlipidemia Father    Colon cancer Neg Hx    Colon polyps Neg Hx    Breast cancer Neg Hx    Crohn's disease Neg Hx    Esophageal cancer Neg Hx    Rectal cancer Neg Hx    Stomach cancer Neg Hx    Ulcerative colitis Neg Hx    Social History:  reports that she has never smoked. She has never used smokeless tobacco. She reports that she does not drink alcohol and does not use drugs. Allergies:  Allergies  Allergen Reactions   Amoxil  [Amoxicillin ] Shortness Of Breath and Palpitations   Morphine  And Codeine Hives and Rash   Ultram  [Tramadol ] Nausea And Vomiting   Medications Prior to Admission  Medication Sig Dispense Refill   fluticasone  furoate-vilanterol (BREO ELLIPTA ) 100-25 MCG/ACT AEPB Inhale 1 puff into the lungs daily. 30 each 5   hydrochlorothiazide  (HYDRODIURIL ) 25 MG tablet Take by mouth.     ibuprofen  (ADVIL ) 800 MG tablet Take 1 tablet (800 mg total) by mouth 3 (three) times daily. (Patient taking differently: Take 800 mg by mouth 3 (three) times daily as needed (back pain).) 21 tablet 0   isoniazid  (NYDRAZID ) 300 MG tablet Take 1 tablet (300 mg total) by mouth daily. (Patient taking differently: Take 300 mg by mouth daily as needed ("lung flares").) 30 tablet 8     Blood pressure (!) 140/89, pulse 94, temperature 97.8 F (36.6 C), temperature source Oral, resp. rate (!) 24, height 5\' 4"  (1.626 m), weight 99.6 kg, SpO2 99%. Physical Exam Constitutional:      Appearance: She is obese.  HENT:     Head: Normocephalic and atraumatic.     Mouth/Throat:     Mouth: Mucous membranes are moist.  Cardiovascular:     Rate and Rhythm: Normal rate and regular rhythm.     Heart sounds: Normal heart sounds.  No murmur heard. Pulmonary:     Effort: Pulmonary effort is normal. No respiratory distress.     Breath sounds: Normal breath sounds. No stridor.  Abdominal:     General: Abdomen is flat. Bowel sounds are normal. There is no distension.     Palpations: Abdomen is soft. There is no mass.     Tenderness: There is no abdominal tenderness.  Musculoskeletal:     Right lower leg: No edema.     Left lower leg: No edema.  Skin:    General: Skin is warm and dry.  Neurological:     Mental Status: She is alert and oriented to person, place, and time.     Comments: Coarse tremor LUE  Give away weakness left upper  and LLE At least 4- Left delt bi tri grip HF, KE and ankle DF  RIght side 5/5 strength   Sensation intact LT in BUE and BLE    Psychiatric:        Mood and Affect: Mood normal.        Behavior: Behavior normal.   Increased latency of response , also required cuing to participate in sensory exam , left vs right, big vs little toe  No results found for this or any previous visit (from the past 24 hours). MR CERVICAL SPINE WO CONTRAST Result Date: 01/22/2024 CLINICAL DATA:  Initial evaluation for left  upper extremity weakness. EXAM: MRI CERVICAL SPINE WITHOUT CONTRAST TECHNIQUE: Multiplanar, multisequence MR imaging of the cervical spine was performed. No intravenous contrast was administered. COMPARISON:  CT from 10/21/2018 FINDINGS: Alignment: Straightening of the normal cervical lordosis. No listhesis. Vertebrae: Vertebral body height maintained without acute or chronic fracture. Bone marrow signal intensity within normal limits. No worrisome osseous lesions. No abnormal marrow edema. Cord: Normal signal and morphology. Posterior Fossa, vertebral arteries, paraspinal tissues: Unremarkable. Disc levels: C2-C3: Small central disc protrusion mildly indents the ventral thecal sac. No spinal stenosis. Foramina remain patent. C3-C4: Small left paracentral disc protrusion indents the ventral  thecal sac. Minimal cord flattening without cord signal changes or significant spinal stenosis. Foramina remain patent. C4-C5: Small central to left paracentral disc protrusion indents the ventral thecal sac. Minimal cord flattening without cord signal changes or significant spinal stenosis. Foramina remain patent. C5-C6: Left paracentral disc protrusion indents and partially faces the ventral thecal sac. Mild flattening of the left hemi cord without cord signal changes or significant spinal stenosis. Foramina remain patent. C6-C7: Small right paracentral disc protrusion indents the right ventral thecal sac. Minimal cord flattening without cord signal changes or significant spinal stenosis. Foramina remain patent. C7-T1: Negative interspace. Mild left-sided facet hypertrophy. No canal or foraminal stenosis. IMPRESSION: 1. Small central to left paracentral disc protrusions at C2-3 through C5-6 as above. Secondary minor cord flattening without cord signal changes or significant spinal stenosis. Findings could contribute to left upper extremity symptoms. 2. Small right paracentral disc protrusion at C6-7 without significant stenosis. Electronically Signed   By: Virgia Griffins M.D.   On: 01/22/2024 18:07    Assessment/Plan: Diagnosis: Hypertensive encephalopathy Does the need for close, 24 hr/day medical supervision in concert with the patient's rehab needs make it unreasonable for this patient to be served in a less intensive setting? Yes Co-Morbidities requiring supervision/potential complications:  -morbid obesity, hypertension uncontrolled  Due to bladder management, bowel management, safety, skin/wound care, disease management, medication administration, pain management, and patient education, does the patient require 24 hr/day rehab nursing? Yes Does the patient require coordinated care of a physician, rehab nurse, therapy disciplines of PT, OT, SLP  to address physical and functional deficits in  the context of the above medical diagnosis(es)? Yes Addressing deficits in the following areas: balance, endurance, locomotion, strength, transferring, bowel/bladder control, bathing, dressing, feeding, grooming, toileting, and psychosocial support Can the patient actively participate in an intensive therapy program of at least 3 hrs of therapy per day at least 5 days per week? Yes The potential for patient to make measurable gains while on inpatient rehab is good Anticipated functional outcomes upon discharge from inpatient rehab are modified independent and supervision  with PT, modified independent and supervision with OT, modified independent with SLP. Estimated rehab length of stay to reach the above functional goals is: 7-10d Anticipated discharge destination: Home Overall Rehab/Functional Prognosis: good  POST ACUTE RECOMMENDATIONS: This patient's condition is appropriate for continued rehabilitative care in the following setting: CIR Patient has agreed to participate in recommended program. Yes Note that insurance prior authorization may be required for reimbursement for recommended care.  Comment: has steps to get in home plus split level configuration     I have personally performed a face to face diagnostic evaluation of this patient. Additionally, I have examined the patient's medical record including any pertinent labs and radiographic images.    Thanks,  Karalee Oscar, DO 01/24/2024 "I have personally performed a face to face diagnostic evaluation of this  patient.  Additionally, I have reviewed and concur with the physician assistant's documentation above." Genetta Kenning M.D. [facility] Fellow Am Acad of Phys Med and Rehab Diplomate Am Board of Electrodiagnostic Med Fellow Am Board of Interventional Pain

## 2024-01-24 NOTE — Progress Notes (Signed)
 Inpatient Rehab Admissions Coordinator:  Saw pt at bedside. Informed her that awaiting physiatrist consult. A colleague will follow up pending consult. Will continue to follow.   Artemus Larsen, MS, CCC-SLP Admissions Coordinator [phone]

## 2024-01-24 NOTE — Plan of Care (Signed)

## 2024-01-25 DIAGNOSIS — E785 Hyperlipidemia, unspecified: Secondary | ICD-10-CM | POA: Diagnosis not present

## 2024-01-25 DIAGNOSIS — I674 Hypertensive encephalopathy: Secondary | ICD-10-CM | POA: Diagnosis not present

## 2024-01-25 DIAGNOSIS — R2990 Unspecified symptoms and signs involving the nervous system: Secondary | ICD-10-CM | POA: Diagnosis not present

## 2024-01-25 DIAGNOSIS — F449 Dissociative and conversion disorder, unspecified: Secondary | ICD-10-CM | POA: Diagnosis not present

## 2024-01-25 NOTE — Plan of Care (Signed)
  Problem: Education: Goal: Knowledge of General Education information will improve Description: Including pain rating scale, medication(s)/side effects and non-pharmacologic comfort measures Outcome: Progressing   Problem: Health Behavior/Discharge Planning: Goal: Ability to manage health-related needs will improve Outcome: Progressing   Problem: Clinical Measurements: Goal: Will remain free from infection Outcome: Progressing   Problem: Activity: Goal: Risk for activity intolerance will decrease Outcome: Progressing   Problem: Nutrition: Goal: Adequate nutrition will be maintained Outcome: Progressing   Problem: Pain Managment: Goal: General experience of comfort will improve and/or be controlled Outcome: Progressing   Problem: Safety: Goal: Ability to remain free from injury will improve Outcome: Progressing   Problem: Skin Integrity: Goal: Risk for impaired skin integrity will decrease Outcome: Progressing

## 2024-01-25 NOTE — TOC Progression Note (Signed)
 Transition of Care [facility]) - Progression Note    Patient Details  Name: OCTAVIE WESTERHOLD MRN: [id number] Date of Birth: 03/29/1966  [facility] CM/SW Contact  Jonathan Neighbor, RN Phone Number: 01/25/2024, 1:33 PM  Clinical Narrative:     Awaiting insurance approval for CIR.  TOC following.  Expected Discharge Plan: IP Rehab Facility Barriers to Discharge: Continued Medical Work up  Expected Discharge Plan and Services   Discharge Planning Services: CM Consult Post Acute Care Choice: IP Rehab Living arrangements for the past 2 months: Single Family Home                                       Social Determinants of Health (SDOH) Interventions SDOH Screenings   Food Insecurity: No Food Insecurity (01/19/2024)  Housing: Unknown (01/19/2024)  Transportation Needs: No Transportation Needs (01/19/2024)  Utilities: Not At Risk (01/19/2024)  Depression (PHQ2-9): Low Risk  (08/27/2023)  Financial Resource Strain: Low Risk  (09/28/2023)   Received from [facility]  Social Connections: Unknown (12/22/2021)   Received from [facility]  Tobacco Use: Low Risk  (01/19/2024)    Readmission Risk Interventions     No data to display

## 2024-01-25 NOTE — Progress Notes (Signed)
 Physical Therapy Treatment Patient Details Name: Jackie Reed MRN: [id number] DOB: 11/26/1965 Today's Date: 01/25/2024   History of Present Illness Pt is a 58 yo female admitted to [facility] on 01/19/24 for acute onset L sided weakness. She received TNK at [facility], MRI was negative for acute infarcts EEG with no acute findings. PMH of  sarcoidosis, latent TB, migraines and prediabetes.    PT Comments  Pt tolerated treatment well today. Pt today was able to progress ambulation distance in hallway with RW Min A and a chair follow progressing to Mod A once fatigued. No change in DC/DME recs at this time. PT will continue to follow.     If plan is discharge home, recommend the following: A little help with walking and/or transfers;Assistance with cooking/housework;Assist for transportation;Help with stairs or ramp for entrance   Can travel by private vehicle        Equipment Recommendations  Rolling walker (2 wheels);BSC/3in1    Recommendations for Other Services       Precautions / Restrictions Precautions Precautions: Fall Recall of Precautions/Restrictions: Intact Restrictions Weight Bearing Restrictions Per Provider Order: No     Mobility  Bed Mobility Overal bed mobility: Needs Assistance Bed Mobility: Supine to Sit     Supine to sit: Contact guard     General bed mobility comments: Increased time however pt able to perform on her own.    Transfers Overall transfer level: Needs assistance Equipment used: Rolling walker (2 wheels) Transfers: Sit to/from Stand Sit to Stand: Min assist, +2 safety/equipment           General transfer comment: Cues for hand placement. Min A to boost up.    Ambulation/Gait Ambulation/Gait assistance: Min assist, +2 safety/equipment, Mod assist (Chair follow) Gait Distance (Feet): 90 Feet Assistive device: Rolling walker (2 wheels) Gait Pattern/deviations: Step-to pattern, Ataxic, Decreased step length - left, Decreased stance time -  left Gait velocity: decreased     General Gait Details: Very slow gait with poor floor clearance, step to gait pattern with decreased step length and stance time on the L with intermittent ataxic movements of the LLE with poor progression anteriorly.  Pt with tremor at times in left LE and UE which worsened the longer she ambulated. Mod A once fatigued. Cues for longer step length on LLE.   Stairs             Wheelchair Mobility     Tilt Bed    Modified Rankin (Stroke Patients Only)       Balance Overall balance assessment: Needs assistance Sitting-balance support: No upper extremity supported, Feet supported Sitting balance-Leahy Scale: Fair Sitting balance - Comments: CGA sitting EOB   Standing balance support: Reliant on assistive device for balance, Bilateral upper extremity supported, During functional activity Standing balance-Leahy Scale: Poor Standing balance comment: reliant on RW                            Communication Communication Communication: No apparent difficulties  Cognition Arousal: Alert Behavior During Therapy: WFL for tasks assessed/performed                             Following commands: Intact      Cueing Cueing Techniques: Verbal cues  Exercises      General Comments General comments (skin integrity, edema, etc.): VSS      Pertinent Vitals/Pain Pain Assessment Pain Assessment: No/denies  pain    Home Living                          Prior Function            PT Goals (current goals can now be found in the care plan section) Progress towards PT goals: Progressing toward goals    Frequency    Min 2X/week      PT Plan      Co-evaluation              AM-PAC PT "6 Clicks" Mobility   Outcome Measure  Help needed turning from your back to your side while in a flat bed without using bedrails?: A Little Help needed moving from lying on your back to sitting on the side of a flat  bed without using bedrails?: A Little Help needed moving to and from a bed to a chair (including a wheelchair)?: A Little Help needed standing up from a chair using your arms (e.g., wheelchair or bedside chair)?: A Little Help needed to walk in hospital room?: A Lot Help needed climbing 3-5 steps with a railing? : Total 6 Click Score: 15    End of Session Equipment Utilized During Treatment: Gait belt Activity Tolerance: Patient tolerated treatment well;Patient limited by fatigue Patient left: in chair;with call bell/phone within reach;with chair alarm set Nurse Communication: Mobility status PT Visit Diagnosis: Unsteadiness on feet (R26.81);Other abnormalities of gait and mobility (R26.89)     Time: 1610-9604 PT Time Calculation (min) (ACUTE ONLY): 28 min  Charges:    $Gait Training: 23-37 mins PT General Charges $$ ACUTE PT VISIT: 1 Visit                     Rodgers Clack, PT, DPT Acute Rehab Services [phone]    Aryannah Mohon 01/25/2024, 11:24 AM

## 2024-01-25 NOTE — Progress Notes (Signed)
 Inpatient Rehabilitation Admissions Coordinator   I await insurance approval for possible CIR admit. I met with patient and her spouse at bedside  Jeannetta Millman, RN, MSN Rehab Admissions Coordinator [phone] 01/25/2024 2:40 PM

## 2024-01-25 NOTE — Progress Notes (Deleted)
 Inpatient Rehabilitation Admissions Coordinator   I met with patient and Mom at bedside after she worked with therapy today. She is no longer in need of AIR level rehab. Cognition issues noted. Patient plans to stay with boyfriend and Mom states he will be working half days. I again reiterated to Mom and Patient the need for initial 24/7 supervision at home. We will sign off at this time. TOC made aware.  Jeannetta Millman, RN, MSN Rehab Admissions Coordinator [phone] 01/25/2024 2:41 PM

## 2024-01-25 NOTE — Progress Notes (Addendum)
 STROKE TEAM PROGRESS NOTE    SIGNIFICANT HOSPITAL EVENTS 5/28-patient admitted and given TNK for left-sided weakness  INTERIM HISTORY/SUBJECTIVE Patient continues to remain hemodynamically stable and afebrile.  Neurological exam is unchanged, and Jackie Reed is hoping to have some rehabilitation here before returning home.  CIR evaluation likely recommending that patient does not meet criteria.  Patient was able to get up and ambulate with therapy earlier today.  Jackie Reed still feels her leg is dragging and heavy and Jackie Reed is not comfortable going home since Jackie Reed has to walk up several steps OBJECTIVE  CBC    Component Value Date/Time   WBC 5.2 01/22/2024 1228   RBC 6.19 (H) 01/22/2024 1228   HGB 13.7 01/22/2024 1228   HGB 12.0 07/28/2023 1017   HCT 44.7 01/22/2024 1228   HCT 40.9 07/28/2023 1017   PLT 393 01/22/2024 1228   PLT 385 07/28/2023 1017   MCV 72.2 (L) 01/22/2024 1228   MCV 74 (L) 07/28/2023 1017   MCH 22.1 (L) 01/22/2024 1228   MCHC 30.6 01/22/2024 1228   RDW 16.6 (H) 01/22/2024 1228   RDW 15.3 07/28/2023 1017   LYMPHSABS 1.4 01/19/2024 1514   LYMPHSABS 1.0 07/28/2023 1017   MONOABS 0.8 01/19/2024 1514   EOSABS 0.3 01/19/2024 1514   EOSABS 0.2 07/28/2023 1017   BASOSABS 0.0 01/19/2024 1514   BASOSABS 0.0 07/28/2023 1017    BMET    Component Value Date/Time   NA 138 01/20/2024 0502   NA 139 07/28/2023 1017   K 3.7 01/20/2024 0502   CL 106 01/20/2024 0502   CO2 24 01/20/2024 0502   GLUCOSE 100 (H) 01/20/2024 0502   BUN 11 01/20/2024 0502   BUN 13 07/28/2023 1017   CREATININE 0.84 01/22/2024 1228   CREATININE 0.64 01/20/2023 1424   CALCIUM  9.0 01/20/2024 0502   EGFR 92 07/28/2023 1017   GFRNONAA >60 01/22/2024 1228   GFRNONAA >60 01/20/2023 1424    IMAGING past 24 hours No results found.    Vitals:   01/25/24 0815 01/25/24 0836 01/25/24 1129 01/25/24 1129  BP:  115/83 119/83 119/83  Pulse:  91 90 88  Resp:   18 18  Temp:  97.7 F (36.5 C) 98.2 F (36.8 C)  98.2 F (36.8 C)  TempSrc:  Oral Oral Oral  SpO2: 99% 99% 100% 100%  Weight:      Height:         PHYSICAL EXAM General:  Alert, well-nourished, well-developed patient in no acute distress Psych:  Mood and affect appropriate for situation CV: Regular rate and rhythm on monitor Respiratory:  Regular, unlabored respirations on room air  NEURO:  Mental Status: AA&Ox3, patient is able to give clear and coherent history Speech/Language: speech is without dysarthria or aphasia.    Cranial Nerves:  II: PERRL. Visual fields full.  III, IV, VI: EOMI. Eyelids elevate symmetrically.  V: Sensation is intact to light touch and symmetrical to face.  VII: Face is symmetrical resting and smiling VIII: hearing intact to voice. IX, X: Phonation is normal.  XII: tongue is midline without fasciculations. Motor: 5/5 strength to right upper and lower extremities, 4/5 strength to left upper extremity with shaking drift.  Giveaway weakness with poor and variable effort on the left Tone: is normal and bulk is normal Sensation- Intact to light touch bilaterally.  Gait- deferred  Most Recent NIH  1a Level of Conscious.: 0 1b LOC Questions: 0 1c LOC Commands: 0 2 Best Gaze: 0 3 Visual: 0 4  Facial Palsy: 0 5a Motor Arm - left: 1 5b Motor Arm - Right: 0 6a Motor Leg - Left: 2 6b Motor Leg - Right: 0 7 Limb Ataxia: 1 8 Sensory: 0 9 Best Language: 0 10 Dysarthria: 0 11 Extinct. and Inatten.: 0 TOTAL: 4   ASSESSMENT/PLAN  Jackie Reed is a 58 y.o. female with history of, sarcoidosis, latent TB, migraines and prediabetes admitted for acute onset left arm and leg weakness.  Patient received TNK at [facility] with some delays in administration due to hypertension and was transferred here for post TNK care.  MRI was negative for acute infarct, but some left-sided weakness remains.  NIH on Admission 5  Strokelike episode status post TNK Concerning for hypertensive encephalopathy versus  conversion disorder Patient had significant left upper and lower extremity giveaway weakness and positive Hoover signs Code Stroke CT head No acute abnormality. Small vessel disease. ASPECTS 10.    CTA head & neck no LVO or hemodynamically significant stenosis MRI no acute abnormality, chronic small vessel ischemic disease and volume loss CT Head 24 hr- no acute intracranial abnormality  2D Echo EF 60 to 65% LDL 84 HgbA1c 5.5 UDS negative VTE prophylaxis -SCDs No antithrombotic prior to admission, now on ASA 81mg . Continue on discharge Therapy recommendations:  CIR Disposition: Pending  Hypertensive encephalopathy Home meds: Hydrochlorothiazide  25 mg daily Extremely high BP on presentation Currently stable on high end Resume home HCTZ 25 Add amlodipine  10 Long-term BP goal normotensive  Hyperlipidemia Home meds: None LDL 84, goal < 70  rosuvastatin  20 mg daily Continue statin at discharge  Other Stroke Risk Factors Obesity, Body mass index is 37.69 kg/m., BMI >/= 30 associated with increased stroke risk, recommend weight loss, diet and exercise as appropriate  Migraine  Other Active Problems Latent TB-continue home isoniazid  and pyridoxine  (of note, patient was taking this medication as needed at home, has been advised to take this medication daily and not as needed) Sarcoidosis, follow-up with pulmonary, not on steroids  Hospital day # 6  Patient seen by NP with MD, MD to edit note as needed. Cortney E Bucky Cardinal , MSN, AGACNP-BC [facility] See Amion for schedule and pager information 01/25/2024 12:47 PM   I have personally obtained history,examined this patient, reviewed notes, independently viewed imaging studies, participated in medical decision making and plan of care.ROS completed by me personally and pertinent positives fully documented  I have made any additions or clarifications directly to the above note. Agree with note above.  Patient encouraged  to ambulate with assistance.  Jackie Reed would like to go to inpatient rehab but unfortunately Jackie Reed does not meet the criteria.  Explained to the patient that Jackie Reed likely needs to go home and get some therapy or go to the nursing home if Jackie Reed feels Jackie Reed is not safe at home.  Discussed with the rehab team and case manager.  Greater than 50% time during this 35-minute visit was spent in counseling and coordination of care discussion patient care team and answering questions.  Ardella Beaver, MD Medical Director [facility] Pager: [phone] 01/25/2024 3:11 PM

## 2024-01-25 NOTE — Progress Notes (Signed)
 Speech Language Pathology Treatment: Cognitive-Linquistic  Patient Details Name: Jackie Reed MRN: [id number] DOB: 1965-11-12 Today's Date: 01/25/2024 Time: 1001-1019 SLP Time Calculation (min) (ACUTE ONLY): 18 min  Assessment / Plan / Recommendation Clinical Impression  Pt was seen with medication management task. She recalled strategies offered by OT on previous date with Mod I. SLP practiced with simulated pill box, with pt needing Min cues and repetition for comprehension of instructions as well as self-monitoring. Once errors were identified, she was able to problem solve to correct them with Mod I. Pt came up with an additional strategy to use at home to help her with remembering to take her meds accurately, and voices good motivation. SLP also offered feedback on additional strategies to use. Pt will benefit from ongoing therapy to maximize safety and independence.   HPI HPI: Pt is a 58 yo female admitted to [facility] on 01/19/24 for acute onset L sided weakness. She received TNK at [facility], MRI was negative for acute infarcts. PMH of  sarcoidosis, latent TB, migraines and prediabetes.      SLP Plan  Continue with current plan of care      Recommendations for follow up therapy are one component of a multi-disciplinary discharge planning process, led by the attending physician.  Recommendations may be updated based on patient status, additional functional criteria and insurance authorization.    Recommendations                         Intermittent Supervision/Assistance Cognitive communication deficit (N62.952)     Continue with current plan of care     Beth Brooke., M.A. CCC-SLP Acute Rehabilitation Services Office: [phone]  Secure chat preferred   01/25/2024, 10:22 AM

## 2024-01-26 ENCOUNTER — Other Ambulatory Visit: Payer: Self-pay | Admitting: Neurology

## 2024-01-26 DIAGNOSIS — E785 Hyperlipidemia, unspecified: Secondary | ICD-10-CM | POA: Diagnosis not present

## 2024-01-26 DIAGNOSIS — R2990 Unspecified symptoms and signs involving the nervous system: Secondary | ICD-10-CM | POA: Diagnosis not present

## 2024-01-26 DIAGNOSIS — I674 Hypertensive encephalopathy: Secondary | ICD-10-CM | POA: Diagnosis not present

## 2024-01-26 DIAGNOSIS — F449 Dissociative and conversion disorder, unspecified: Secondary | ICD-10-CM | POA: Diagnosis not present

## 2024-01-26 NOTE — Progress Notes (Signed)
 Occupational Therapy Treatment Patient Details Name: Jackie Reed MRN: [id number] DOB: 11-29-1965 Today's Date: 01/26/2024   History of present illness Pt is a 58 yo female admitted to [facility] on 01/19/24 for acute onset L sided weakness. She received TNK at [facility], MRI was negative for acute infarcts EEG with no acute findings. PMH of  sarcoidosis, latent TB, migraines and prediabetes.   OT comments  Patient making steady progress towards goals. Patient session focus on cognition and further assessment for functional independence. Patient completing LUE [facility] task while sorting card, with no errors noted to sort. Patient with need for increased time due to LUE ataxia. Further cognitive education provided with regard to [facility] and recall, with patient in agreement. Patient continues to be an excellent candidate for intensive rehab >3 hours. OT will continue to follow.       If plan is discharge home, recommend the following:  A little help with walking and/or transfers;A lot of help with bathing/dressing/bathroom;Assistance with cooking/housework;Assist for transportation   Equipment Recommendations  Other (comment);Tub/shower seat (RW)    Recommendations for Other Services      Precautions / Restrictions Precautions Precautions: Fall Recall of Precautions/Restrictions: Intact Restrictions Weight Bearing Restrictions Per Provider Order: No       Mobility Bed Mobility Overal bed mobility: Needs Assistance Bed Mobility: Supine to Sit     Supine to sit: Contact guard     General bed mobility comments: minimal increased time required    Transfers Overall transfer level: Needs assistance Equipment used: Rolling walker (2 wheels) Transfers: Sit to/from Stand Sit to Stand: Min assist           General transfer comment: Cues for hand placement, able to complete min A solely for safety, increased time required     Balance Overall balance assessment: Needs assistance Sitting-balance  support: No upper extremity supported, Feet supported Sitting balance-Leahy Scale: Fair Sitting balance - Comments: CGA sitting EOB   Standing balance support: Reliant on assistive device for balance, Bilateral upper extremity supported, During functional activity Standing balance-Leahy Scale: Poor Standing balance comment: reliant on RW                           ADL either performed or assessed with clinical judgement   ADL Overall ADL's : Needs assistance/impaired                     Lower Body Dressing: Contact guard assist;Bed level Lower Body Dressing Details (indicate cue type and reason): able to don L sock Toilet Transfer: Minimal assistance;Rolling walker (2 wheels);Ambulation;Cueing for sequencing;Cueing for safety Toilet Transfer Details (indicate cue type and reason): bed to chair         Functional mobility during ADLs: Minimal assistance;Rolling walker (2 wheels) General ADL Comments: Patient making steady progress towards goals. Patient session focus on cognition and further assessment for functional independence. Patient completing LUE [facility] task while sorting card, with no errors noted to sort. Patient with need for increased time due to LUE ataxia. Further cognitive education provided with regard to [facility] and recall, with patient in agreement.  Patient continues to be an excellent candidate for intensive rehab >3 hours. OT will continue to follow.    Extremity/Trunk Assessment Upper Extremity Assessment LUE Deficits / Details: continued ataxic movements, appears to exacerbate when stressed or overwhelmed LUE Sensation: WNL LUE Coordination: decreased gross motor;decreased fine motor  Vision       Perception     Praxis     Communication Communication Communication: No apparent difficulties   Cognition Arousal: Alert Behavior During Therapy: WFL for tasks assessed/performed Cognition: Cognition impaired     Awareness:  Intellectual awareness intact, Online awareness impaired Memory impairment (select all impairments): Short-term memory Attention impairment (select first level of impairment): Selective attention Executive functioning impairment (select all impairments): Problem solving, Reasoning OT - Cognition Comments: Able to sort cards by suit without issue                 Following commands: Intact        Cueing   Cueing Techniques: Verbal cues  Exercises      Shoulder Instructions       General Comments      Pertinent Vitals/ Pain       Pain Assessment Pain Assessment: No/denies pain  Home Living                                          Prior Functioning/Environment              Frequency  Min 2X/week        Progress Toward Goals  OT Goals(current goals can now be found in the care plan section)  Progress towards OT goals: Progressing toward goals  Acute Rehab OT Goals Patient Stated Goal: to get to rehab OT Goal Formulation: With patient Time For Goal Achievement: 02/04/24 Potential to Achieve Goals: Good  Plan      Co-evaluation                 AM-PAC OT "6 Clicks" Daily Activity     Outcome Measure   Help from another person eating meals?: A Little Help from another person taking care of personal grooming?: A Little Help from another person toileting, which includes using toliet, bedpan, or urinal?: A Lot Help from another person bathing (including washing, rinsing, drying)?: A Lot Help from another person to put on and taking off regular upper body clothing?: A Little Help from another person to put on and taking off regular lower body clothing?: A Little 6 Click Score: 16    End of Session Equipment Utilized During Treatment: Gait belt;Rolling walker (2 wheels)  OT Visit Diagnosis: Unsteadiness on feet (R26.81);Other abnormalities of gait and mobility (R26.89);Ataxia, unspecified (R27.0);Muscle weakness (generalized)  (M62.81);Hemiplegia and hemiparesis Hemiplegia - Right/Left: Left Hemiplegia - dominant/non-dominant: Dominant Hemiplegia - caused by: Unspecified   Activity Tolerance Patient tolerated treatment well   Patient Left in chair;with call bell/phone within reach;with family/visitor present   Nurse Communication Mobility status        Time: 1610-9604 OT Time Calculation (min): 24 min  Charges: OT General Charges $OT Visit: 1 Visit OT Treatments $Self Care/Home Management : 23-37 mins  Jackie Reed, OTR/L Acute Rehabilitation Services [phone]   Vincent Greek 01/26/2024, 12:07 PM

## 2024-01-26 NOTE — Progress Notes (Addendum)
 STROKE TEAM PROGRESS NOTE    SIGNIFICANT HOSPITAL EVENTS 5/28-patient admitted and given TNK for left-sided weakness  INTERIM HISTORY/SUBJECTIVE Patient continues to remain hemodynamically stable and afebrile.  Neurological exam is unchanged, and she is hoping to have some rehabilitation here before returning home.  CIR evaluation likely recommending that patient does not meet criteria.  Patient was able to get up and ambulate with therapy earlier today.  She still feels her leg is dragging and heavy and she is not comfortable going home since she has to walk up several steps  OBJECTIVE  CBC    Component Value Date/Time   WBC 5.2 01/22/2024 1228   RBC 6.19 (H) 01/22/2024 1228   HGB 13.7 01/22/2024 1228   HGB 12.0 07/28/2023 1017   HCT 44.7 01/22/2024 1228   HCT 40.9 07/28/2023 1017   PLT 393 01/22/2024 1228   PLT 385 07/28/2023 1017   MCV 72.2 (L) 01/22/2024 1228   MCV 74 (L) 07/28/2023 1017   MCH 22.1 (L) 01/22/2024 1228   MCHC 30.6 01/22/2024 1228   RDW 16.6 (H) 01/22/2024 1228   RDW 15.3 07/28/2023 1017   LYMPHSABS 1.4 01/19/2024 1514   LYMPHSABS 1.0 07/28/2023 1017   MONOABS 0.8 01/19/2024 1514   EOSABS 0.3 01/19/2024 1514   EOSABS 0.2 07/28/2023 1017   BASOSABS 0.0 01/19/2024 1514   BASOSABS 0.0 07/28/2023 1017    BMET    Component Value Date/Time   NA 138 01/20/2024 0502   NA 139 07/28/2023 1017   K 3.7 01/20/2024 0502   CL 106 01/20/2024 0502   CO2 24 01/20/2024 0502   GLUCOSE 100 (H) 01/20/2024 0502   BUN 11 01/20/2024 0502   BUN 13 07/28/2023 1017   CREATININE 0.84 01/22/2024 1228   CREATININE 0.64 01/20/2023 1424   CALCIUM  9.0 01/20/2024 0502   EGFR 92 07/28/2023 1017   GFRNONAA >60 01/22/2024 1228   GFRNONAA >60 01/20/2023 1424    IMAGING past 24 hours No results found.    Vitals:   01/26/24 0323 01/26/24 0326 01/26/24 0844 01/26/24 0903  BP: 114/69 114/69 122/73   Pulse:  90 84 84  Resp:    (!) 22  Temp:   97.9 F (36.6 C)   TempSrc:    Oral   SpO2:  99% 99% 99%  Weight:      Height:         PHYSICAL EXAM General:  Alert, well-nourished, well-developed patient in no acute distress Psych:  Mood and affect appropriate for situation CV: Regular rate and rhythm on monitor Respiratory:  Regular, unlabored respirations on room air  NEURO:  Mental Status: AA&Ox3, patient is able to give clear and coherent history Speech/Language: speech is without dysarthria or aphasia.    Cranial Nerves:  II: PERRL. Visual fields full.  III, IV, VI: EOMI. Eyelids elevate symmetrically.  V: Sensation is intact to light touch and symmetrical to face.  VII: Face is symmetrical resting and smiling VIII: hearing intact to voice. IX, X: Phonation is normal.  XII: tongue is midline without fasciculations. Motor: 5/5 strength to right upper and lower extremity, 4/5 strength to left upper extremity with shaking drift.  3-4/5 strength in left lower extremity Giveaway weakness with poor and variable effort on the le Tone: is normal and bulk is normal Sensation- Intact to light touch bilaterally.  Gait- deferred  Most Recent NIH  1a Level of Conscious.: 0 1b LOC Questions: 0 1c LOC Commands: 0 2 Best Gaze: 0 3 Visual:  0 4 Facial Palsy: 0 5a Motor Arm - left: 1 5b Motor Arm - Right: 0 6a Motor Leg - Left: 2 6b Motor Leg - Right: 0 7 Limb Ataxia: 1 8 Sensory: 0 9 Best Language: 0 10 Dysarthria: 0 11 Extinct. and Inatten.: 0 TOTAL: 4   ASSESSMENT/PLAN  Jackie Reed is a 58 y.o. female with history of, sarcoidosis, latent TB, migraines and prediabetes admitted for acute onset left arm and leg weakness.  Patient received TNK at [facility] with some delays in administration due to hypertension and was transferred here for post TNK care.  MRI was negative for acute infarct, but some left-sided weakness remains.  NIH on Admission 5  Strokelike episode status post TNK Concerning for hypertensive encephalopathy versus conversion  disorder Patient had significant left upper and lower extremity giveaway weakness and positive Hoover signs Code Stroke CT head No acute abnormality. Small vessel disease. ASPECTS 10.    CTA head & neck no LVO or hemodynamically significant stenosis MRI no acute abnormality, chronic small vessel ischemic disease and volume loss CT Head 24 hr- no acute intracranial abnormality  2D Echo EF 60 to 65% LDL 84 HgbA1c 5.5 UDS negative VTE prophylaxis -SCDs No antithrombotic prior to admission, now on ASA 81mg . Continue on discharge Therapy recommendations:  CIR Disposition: Pending  Hypertensive encephalopathy Home meds: Hydrochlorothiazide  25 mg daily Extremely high BP on presentation Currently stable on high end Resume home HCTZ 25 Add amlodipine  10 Long-term BP goal normotensive  Hyperlipidemia Home meds: None LDL 84, goal < 70  rosuvastatin  20 mg daily Continue statin at discharge  Other Stroke Risk Factors Obesity, Body mass index is 37.69 kg/m., BMI >/= 30 associated with increased stroke risk, recommend weight loss, diet and exercise as appropriate  Migraine  Other Active Problems Latent TB-continue home isoniazid  and pyridoxine  (of note, patient was taking this medication as needed at home, has been advised to take this medication daily and not as needed) Sarcoidosis, follow-up with pulmonary, not on steroids  Hospital day # 7   01/26/2024 10:34 AM  Patient seen and examined by NP/APP with MD. MD to update note as needed.   Jackie Mana, DNP, FNP-BC [facility] Pager: [phone] I have personally obtained history,examined this patient, reviewed notes, independently viewed imaging studies, participated in medical decision making and plan of care.ROS completed by me personally and pertinent positives fully documented  I have made any additions or clarifications directly to the above note. Agree with note above.  Patient neurologically stable to be  transferred to inpatient rehab when bed available after insurance approval.  Jackie Beaver, MD Medical Director [facility] St[facility] Center Pager: [phone] 01/26/2024 12:30 PM

## 2024-01-27 DIAGNOSIS — I10 Essential (primary) hypertension: Secondary | ICD-10-CM | POA: Diagnosis not present

## 2024-01-27 DIAGNOSIS — I674 Hypertensive encephalopathy: Secondary | ICD-10-CM | POA: Diagnosis not present

## 2024-01-27 DIAGNOSIS — I739 Peripheral vascular disease, unspecified: Secondary | ICD-10-CM

## 2024-01-27 DIAGNOSIS — R29818 Other symptoms and signs involving the nervous system: Secondary | ICD-10-CM | POA: Diagnosis not present

## 2024-01-27 DIAGNOSIS — F449 Dissociative and conversion disorder, unspecified: Secondary | ICD-10-CM | POA: Diagnosis not present

## 2024-01-27 NOTE — [place] FL2 (Signed)
 [place]  MEDICAID FL2 LEVEL OF CARE FORM     IDENTIFICATION  Patient Name: Jackie Reed Birthdate: Sep 30, 1965 Sex: female Admission Date (Current Location): 01/19/2024  [facility] and [place] Number:  Producer, television/film/video and Address:  [facility] [place], [address] [place], [place] [address]      Provider Number: [id number]  Attending Physician Name and Address:  Stroke, Md, MD  Relative Name and Phone Number:       Current Level of Care: Hospital Recommended Level of Care: Skilled Nursing Facility Prior Approval Number:    Date Approved/Denied:   PASRR Number: [id number]  Discharge Plan: SNF    Current Diagnoses: Patient Active Problem List   Diagnosis Date Noted   Hypertensive encephalopathy 01/20/2024   Medication monitoring encounter 10/01/2023   TB lung, latent 08/27/2023   Sarcoidosis 04/22/2023   Adenopathy 02/09/2023   History of COVID-19 10/03/2020   Hepatitis B carrier (HCC) 07/03/2020   Mass of left hand 10/02/2019   Elevated blood pressure reading in office with diagnosis of hypertension 09/11/2019   Pre-diabetes 06/14/2019   Low back pain without sciatica 03/07/2019   Back muscle spasm 03/07/2019   History of recent fall 12/31/2018   Sprain of right ankle 11/18/2018   Class 2 severe obesity due to excess calories with serious comorbidity and body mass index (BMI) of 35.0 to 35.9 in adult [facility]) 10/31/2018   Essential hypertension, benign 11/08/2015   Tendon calcification 11/08/2015   Adjustment disorder with mixed anxiety and depressed mood 11/08/2015   Insomnia 11/08/2015    Orientation RESPIRATION BLADDER Height & Weight     Self, Time, Situation, Place  Normal Continent, External catheter Weight: 219 lb 9.3 oz (99.6 kg) Height:  5\' 4"  (162.6 cm)  BEHAVIORAL SYMPTOMS/MOOD NEUROLOGICAL BOWEL NUTRITION STATUS      Continent Diet (See dc summary)  AMBULATORY STATUS COMMUNICATION OF NEEDS Skin   Limited Assist Verbally  Normal                       Personal Care Assistance Level of Assistance  Bathing, Feeding, Dressing Bathing Assistance: Limited assistance Feeding assistance: Independent Dressing Assistance: Limited assistance     Functional Limitations Info  Sight Sight Info: Impaired (glasses)        SPECIAL CARE FACTORS FREQUENCY  PT (By licensed PT), OT (By licensed OT)     PT Frequency: 5x/week OT Frequency: 5x/week            Contractures Contractures Info: Not present    Additional Factors Info  Code Status, Allergies Code Status Info: Not on file Allergies Info: Amoxil  (Amoxicillin ), Morphine  And Codeine, Ultram  (Tramadol )           Current Medications (01/27/2024):  This is the current hospital active medication list Current Facility-Administered Medications  Medication Dose Route Frequency Provider Last Rate Last Admin   acetaminophen  (TYLENOL ) tablet 975 mg  975 mg Oral Q8H PRN de Thayne Fine, Cortney E, NP   975 mg at 01/27/24 1032   amLODipine  (NORVASC ) tablet 10 mg  10 mg Oral Daily Consuelo Denmark, MD   10 mg at 01/27/24 1028   aspirin  EC tablet 81 mg  81 mg Oral Daily Consuelo Denmark, MD   81 mg at 01/27/24 1028   docusate sodium (COLACE) capsule 100 mg  100 mg Oral BID Donalynn Fry, MD   100 mg at 01/27/24 1028   enoxaparin (LOVENOX) injection 50 mg  50  mg Subcutaneous Q24H Sethi, Pramod S, MD   50 mg at 01/27/24 1340   fluticasone  furoate-vilanterol (BREO ELLIPTA ) 100-25 MCG/ACT 1 puff  1 puff Inhalation Daily Augustin Leber, MD   1 puff at 01/27/24 1610   hydrochlorothiazide  (HYDRODIURIL ) tablet 25 mg  25 mg Oral Daily Consuelo Denmark, MD   25 mg at 01/27/24 1028   isoniazid  (NYDRAZID ) tablet 300 mg  300 mg Oral q AM Paytes, Austin A, RPH   300 mg at 01/27/24 1029   Oral care mouth rinse  15 mL Mouth Rinse PRN Augustin Leber, MD       pyridOXINE  (VITAMIN B6) tablet 50 mg  50 mg Oral Daily Augustin Leber, MD   50 mg at 01/27/24 1029    rosuvastatin  (CRESTOR ) tablet 20 mg  20 mg Oral Daily de Thayne Fine, Cortney E, NP   20 mg at 01/27/24 1028     Discharge Medications: Please see discharge summary for a list of discharge medications.  Relevant Imaging Results:  Relevant Lab Results:   Additional Information SSn: [id number]  Sherah Lund S Arpi Diebold, LCSW

## 2024-01-27 NOTE — Progress Notes (Signed)
  Inpatient Rehabilitation Admissions Coordinator   I met with patient to review estimated cost of care if insurance approves CIR. I re faxed clinicals to payor this am. I explained to patient that payor may not approve.She is open to SNF if denied. Her sister in law can provide care when her spouse works. She lives in a split level home and needs to mobilize on stairs.  Jeannetta Millman, RN, MSN Rehab Admissions Coordinator [phone] 01/27/2024 10:48 AM

## 2024-01-27 NOTE — Plan of Care (Deleted)
  Problem: Education: Goal: Knowledge of General Education information will improve Description: Including pain rating scale, medication(s)/side effects and non-pharmacologic comfort measures 01/27/2024 1902 by Waylon Hahn, RN Outcome: Progressing 01/27/2024 1901 by Waylon Hahn, RN Outcome: Progressing   Problem: Activity: Goal: Risk for activity intolerance will decrease 01/27/2024 1902 by Waylon Hahn, RN Outcome: Not Progressing 01/27/2024 1901 by Waylon Hahn, RN Outcome: Progressing   Problem: Nutrition: Goal: Adequate nutrition will be maintained Outcome: Not Progressing   Problem: Pain Managment: Goal: General experience of comfort will improve and/or be controlled Outcome: Progressing   Problem: Safety: Goal: Ability to remain free from injury will improve 01/27/2024 1902 by Waylon Hahn, RN Outcome: Not Progressing 01/27/2024 1901 by Waylon Hahn, RN Outcome: Progressing

## 2024-01-27 NOTE — Progress Notes (Signed)
 Physical Therapy Treatment Patient Details Name: TRYSTEN BERTI MRN: [id number] DOB: [birth year] Today's Date: 01/27/2024   History of Present Illness Pt is a 58 yo female admitted to [facility] on 01/19/24 for acute onset L sided weakness. She received TNK at [facility], MRI was negative for acute infarcts EEG with no acute findings. PMH of  sarcoidosis, latent TB, migraines and prediabetes.    PT Comments  Pt tolerated treatment well today. Pt today was able to progress ambulation distance in hallway with RW Min/Mod A with +2 for chair follow. Trialed mirror in front of pt for visual cue to increase step length on LLE. Pt only noted with slight improvement in step length with mirror however noted with greatest improvement with verbal distractions. No change in DC/DME recs at this time. PT will continue to follow.     If plan is discharge home, recommend the following: A little help with walking and/or transfers;Assistance with cooking/housework;Assist for transportation;Help with stairs or ramp for entrance   Can travel by private vehicle        Equipment Recommendations  Rolling walker (2 wheels);BSC/3in1    Recommendations for Other Services       Precautions / Restrictions Precautions Precautions: Fall Recall of Precautions/Restrictions: Intact Restrictions Weight Bearing Restrictions Per Provider Order: No     Mobility  Bed Mobility               General bed mobility comments: Pt up in recliner    Transfers Overall transfer level: Needs assistance Equipment used: Rolling walker (2 wheels) Transfers: Sit to/from Stand Sit to Stand: Min assist           General transfer comment: Cues for hand placement, able to complete min A solely for safety, increased time required    Ambulation/Gait Ambulation/Gait assistance: Min assist, +2 safety/equipment, Mod assist Gait Distance (Feet): 105 Feet (75+30) Assistive device: Rolling walker (2 wheels) Gait Pattern/deviations: Step-to  pattern, Ataxic, Decreased step length - left, Decreased stance time - left Gait velocity: decreased     General Gait Details: Very slow gait with poor floor clearance, step to gait pattern with decreased step length and stance time on the L with intermittent ataxic movements of the LLE with poor progression anteriorly.  Pt with tremor at times in left LE and UE which worsened the longer she ambulated. Mod A once fatigued. Cues for longer step length on LLE. Trialed mirror in front of pt for longer step length however only slight improvement noted in step length.   Stairs             Wheelchair Mobility     Tilt Bed    Modified Rankin (Stroke Patients Only)       Balance Overall balance assessment: Needs assistance Sitting-balance support: No upper extremity supported, Feet supported Sitting balance-Leahy Scale: Fair     Standing balance support: Reliant on assistive device for balance, Bilateral upper extremity supported, During functional activity Standing balance-Leahy Scale: Poor Standing balance comment: reliant on RW                            Communication Communication Communication: No apparent difficulties  Cognition Arousal: Alert Behavior During Therapy: WFL for tasks assessed/performed   PT - Cognitive impairments: No apparent impairments                         Following commands: Intact  Cueing Cueing Techniques: Verbal cues  Exercises      General Comments General comments (skin integrity, edema, etc.): VSS      Pertinent Vitals/Pain Pain Assessment Pain Assessment: No/denies pain    Home Living                          Prior Function            PT Goals (current goals can now be found in the care plan section) Progress towards PT goals: Progressing toward goals    Frequency    Min 2X/week      PT Plan      Co-evaluation              AM-PAC PT "6 Clicks" Mobility   Outcome  Measure  Help needed turning from your back to your side while in a flat bed without using bedrails?: A Little Help needed moving from lying on your back to sitting on the side of a flat bed without using bedrails?: A Little Help needed moving to and from a bed to a chair (including a wheelchair)?: A Little Help needed standing up from a chair using your arms (e.g., wheelchair or bedside chair)?: A Little Help needed to walk in hospital room?: A Lot Help needed climbing 3-5 steps with a railing? : Total 6 Click Score: 15    End of Session Equipment Utilized During Treatment: Gait belt Activity Tolerance: Patient tolerated treatment well;Patient limited by fatigue Patient left: in chair;with call bell/phone within reach;with chair alarm set;with family/visitor present Nurse Communication: Mobility status PT Visit Diagnosis: Unsteadiness on feet (R26.81);Other abnormalities of gait and mobility (R26.89)     Time: 1610-9604 PT Time Calculation (min) (ACUTE ONLY): 34 min  Charges:    $Gait Training: 23-37 mins PT General Charges $$ ACUTE PT VISIT: 1 Visit                     Oksana Deberry B, PT, DPT Acute Rehab Services [phone]    Kaushik Maul 01/27/2024, 3:16 PM

## 2024-01-27 NOTE — Progress Notes (Addendum)
 STROKE TEAM PROGRESS NOTE    SIGNIFICANT HOSPITAL EVENTS 5/28-patient admitted and given TNK for left-sided weakness  INTERIM HISTORY/SUBJECTIVE Awaiting approval for CIR vs SNF. Still having left sided weakness. Able to ambulate with assistance. Hemodynamically and neurologically stable.   OBJECTIVE  CBC    Component Value Date/Time   WBC 5.2 01/22/2024 1228   RBC 6.19 (H) 01/22/2024 1228   HGB 13.7 01/22/2024 1228   HGB 12.0 07/28/2023 1017   HCT 44.7 01/22/2024 1228   HCT 40.9 07/28/2023 1017   PLT 393 01/22/2024 1228   PLT 385 07/28/2023 1017   MCV 72.2 (L) 01/22/2024 1228   MCV 74 (L) 07/28/2023 1017   MCH 22.1 (L) 01/22/2024 1228   MCHC 30.6 01/22/2024 1228   RDW 16.6 (H) 01/22/2024 1228   RDW 15.3 07/28/2023 1017   LYMPHSABS 1.4 01/19/2024 1514   LYMPHSABS 1.0 07/28/2023 1017   MONOABS 0.8 01/19/2024 1514   EOSABS 0.3 01/19/2024 1514   EOSABS 0.2 07/28/2023 1017   BASOSABS 0.0 01/19/2024 1514   BASOSABS 0.0 07/28/2023 1017    BMET    Component Value Date/Time   NA 138 01/20/2024 0502   NA 139 07/28/2023 1017   K 3.7 01/20/2024 0502   CL 106 01/20/2024 0502   CO2 24 01/20/2024 0502   GLUCOSE 100 (H) 01/20/2024 0502   BUN 11 01/20/2024 0502   BUN 13 07/28/2023 1017   CREATININE 0.84 01/22/2024 1228   CREATININE 0.64 01/20/2023 1424   CALCIUM  9.0 01/20/2024 0502   EGFR 92 07/28/2023 1017   GFRNONAA >60 01/22/2024 1228   GFRNONAA >60 01/20/2023 1424    IMAGING past 24 hours No results found.    Vitals:   01/27/24 0358 01/27/24 0749 01/27/24 0853 01/27/24 1148  BP: 104/72 110/78  123/85  Pulse: 78 90  88  Resp:  17  16  Temp: 98.5 F (36.9 C) 98.5 F (36.9 C)  98.3 F (36.8 C)  TempSrc: Oral Oral  Oral  SpO2: 99% 99% 99% 100%  Weight:      Height:         PHYSICAL EXAM General:  Alert, well-nourished, well-developed patient in no acute distress Psych:  Mood and affect appropriate for situation CV: Regular rate and rhythm on  monitor Respiratory:  Regular, unlabored respirations on room air  NEURO:  Mental Status: AA&Ox3, patient is able to give clear and coherent history Speech/Language: speech is without dysarthria or aphasia.    Cranial Nerves:  II: PERRL. Visual fields full.  III, IV, VI: EOMI. Eyelids elevate symmetrically.  V: Sensation is intact to light touch and symmetrical to face.  VII: Face is symmetrical resting and smiling VIII: hearing intact to voice. IX, X: Phonation is normal.  XII: tongue is midline without fasciculations. Motor: 5/5 strength to right upper and lower extremity, 4/5 strength to left upper extremity with shaking drift.  3-4/5 strength in left lower extremity Giveaway weakness with poor and variable effort on the le Tone: is normal and bulk is normal Sensation- Intact to light touch bilaterally.  Gait- deferred  Most Recent NIH  1a Level of Conscious.: 0 1b LOC Questions: 0 1c LOC Commands: 0 2 Best Gaze: 0 3 Visual: 0 4 Facial Palsy: 0 5a Motor Arm - left: 1 5b Motor Arm - Right: 0 6a Motor Leg - Left: 1 6b Motor Leg - Right: 0 7 Limb Ataxia: 1 8 Sensory: 0 9 Best Language: 0 10 Dysarthria: 0 11 Extinct. and Inatten.: 0  TOTAL: 3   ASSESSMENT/PLAN  Jackie Reed is a 58 y.o. female with history of, sarcoidosis, latent TB, migraines and prediabetes admitted for acute onset left arm and leg weakness.  Patient received TNK at [facility] with some delays in administration due to hypertension and was transferred here for post TNK care.  MRI was negative for acute infarct, but some left-sided weakness remains.  NIH on Admission 5  Strokelike episode status post TNK Concerning for hypertensive encephalopathy versus conversion disorder Patient had significant left upper and lower extremity giveaway weakness and positive Hoover signs Code Stroke CT head No acute abnormality. Small vessel disease. ASPECTS 10.    CTA head & neck no LVO or hemodynamically  significant stenosis MRI no acute abnormality, chronic small vessel ischemic disease and volume loss CT Head 24 hr- no acute intracranial abnormality  2D Echo EF 60 to 65% LDL 84 HgbA1c 5.5 UDS negative VTE prophylaxis -SCDs No antithrombotic prior to admission, now on ASA 81mg . Continue on discharge Therapy recommendations:  CIR Disposition: Pending  Hypertensive encephalopathy Home meds: Hydrochlorothiazide  25 mg daily Extremely high BP on presentation Currently stable on high end Resume home HCTZ 25 Add amlodipine  10 Long-term BP goal normotensive  Hyperlipidemia Home meds: None LDL 84, goal < 70  rosuvastatin  20 mg daily Continue statin at discharge  Other Stroke Risk Factors Obesity, Body mass index is 37.69 kg/m., BMI >/= 30 associated with increased stroke risk, recommend weight loss, diet and exercise as appropriate  Migraine  Other Active Problems Latent TB-continue home isoniazid  and pyridoxine  (of note, patient was taking this medication as needed at home, has been advised to take this medication daily and not as needed) Sarcoidosis, follow-up with pulmonary, not on steroids  Hospital day # 8   01/27/2024 12:00 PM  Patient seen and examined by NP/APP with MD. MD to update note as needed.   Imogene Mana, DNP, FNP-BC [facility] Pager: [phone]  I have personally obtained history,examined this patient, reviewed notes, independently viewed imaging studies, participated in medical decision making and plan of care.ROS completed by me personally and pertinent positives fully documented  I have made any additions or clarifications directly to the above note. Agree with note above.    Ardella Beaver, MD Medical Director [facility] Pager: [phone] 01/27/2024 1:47 PM

## 2024-01-27 NOTE — Plan of Care (Signed)
  Problem: Education: Goal: Knowledge of General Education information will improve Description: Including pain rating scale, medication(s)/side effects and non-pharmacologic comfort measures 01/27/2024 1902 by Waylon Hahn, RN Outcome: Progressing 01/27/2024 1902 by Waylon Hahn, RN Outcome: Progressing 01/27/2024 1901 by Waylon Hahn, RN Outcome: Progressing   Problem: Activity: Goal: Risk for activity intolerance will decrease 01/27/2024 1902 by Waylon Hahn, RN Outcome: Progressing 01/27/2024 1902 by Waylon Hahn, RN Outcome: Not Progressing 01/27/2024 1901 by Waylon Hahn, RN Outcome: Progressing   Problem: Nutrition: Goal: Adequate nutrition will be maintained Outcome: Not Progressing   Problem: Pain Managment: Goal: General experience of comfort will improve and/or be controlled Outcome: Progressing   Problem: Skin Integrity: Goal: Risk for impaired skin integrity will decrease 01/27/2024 1902 by Waylon Hahn, RN Outcome: Progressing 01/27/2024 1902 by Waylon Hahn, RN Outcome: Not Progressing 01/27/2024 1901 by Waylon Hahn, RN Outcome: Progressing   Problem: Safety: Goal: Ability to remain free from injury will improve 01/27/2024 1902 by Waylon Hahn, RN Outcome: Progressing 01/27/2024 1902 by Waylon Hahn, RN Outcome: Not Progressing 01/27/2024 1901 by Waylon Hahn, RN Outcome: Progressing

## 2024-01-27 NOTE — Plan of Care (Deleted)

## 2024-01-27 NOTE — Plan of Care (Signed)
  Problem: Health Behavior/Discharge Planning: Goal: Ability to manage health-related needs will improve Outcome: Progressing   Problem: Clinical Measurements: Goal: Ability to maintain clinical measurements within normal limits will improve Outcome: Progressing Goal: Will remain free from infection Outcome: Progressing Goal: Diagnostic test results will improve Outcome: Progressing   Problem: Skin Integrity: Goal: Risk for impaired skin integrity will decrease Outcome: Progressing

## 2024-01-28 ENCOUNTER — Encounter (HOSPITAL_COMMUNITY): Payer: Self-pay | Admitting: Neurology

## 2024-01-28 ENCOUNTER — Encounter (HOSPITAL_COMMUNITY): Payer: Self-pay | Admitting: Physical Medicine and Rehabilitation

## 2024-01-28 ENCOUNTER — Other Ambulatory Visit: Payer: Self-pay

## 2024-01-28 ENCOUNTER — Inpatient Hospital Stay (HOSPITAL_COMMUNITY)
Admission: AD | Admit: 2024-01-28 | Discharge: 2024-02-08 | DRG: 057 | Disposition: A | Discharge status: Home / Self Care | Source: Intra-hospital | Attending: Physical Medicine and Rehabilitation | Admitting: Physical Medicine and Rehabilitation

## 2024-01-28 DIAGNOSIS — M4316 Spondylolisthesis, lumbar region: Secondary | ICD-10-CM | POA: Diagnosis present

## 2024-01-28 DIAGNOSIS — Z7951 Long term (current) use of inhaled steroids: Secondary | ICD-10-CM | POA: Diagnosis not present

## 2024-01-28 DIAGNOSIS — M545 Low back pain, unspecified: Secondary | ICD-10-CM | POA: Diagnosis present

## 2024-01-28 DIAGNOSIS — N1330 Unspecified hydronephrosis: Secondary | ICD-10-CM | POA: Diagnosis not present

## 2024-01-28 DIAGNOSIS — E876 Hypokalemia: Secondary | ICD-10-CM | POA: Diagnosis present

## 2024-01-28 DIAGNOSIS — E66812 Obesity, class 2: Secondary | ICD-10-CM | POA: Diagnosis present

## 2024-01-28 DIAGNOSIS — Z8249 Family history of ischemic heart disease and other diseases of the circulatory system: Secondary | ICD-10-CM

## 2024-01-28 DIAGNOSIS — K5900 Constipation, unspecified: Secondary | ICD-10-CM | POA: Diagnosis not present

## 2024-01-28 DIAGNOSIS — D509 Iron deficiency anemia, unspecified: Secondary | ICD-10-CM | POA: Diagnosis present

## 2024-01-28 DIAGNOSIS — D869 Sarcoidosis, unspecified: Secondary | ICD-10-CM | POA: Diagnosis present

## 2024-01-28 DIAGNOSIS — R4189 Other symptoms and signs involving cognitive functions and awareness: Secondary | ICD-10-CM

## 2024-01-28 DIAGNOSIS — Z833 Family history of diabetes mellitus: Secondary | ICD-10-CM

## 2024-01-28 DIAGNOSIS — I10 Essential (primary) hypertension: Secondary | ICD-10-CM | POA: Diagnosis present

## 2024-01-28 DIAGNOSIS — I674 Hypertensive encephalopathy: Secondary | ICD-10-CM | POA: Diagnosis present

## 2024-01-28 DIAGNOSIS — F09 Unspecified mental disorder due to known physiological condition: Secondary | ICD-10-CM | POA: Diagnosis not present

## 2024-01-28 DIAGNOSIS — G9340 Encephalopathy, unspecified: Principal | ICD-10-CM | POA: Diagnosis present

## 2024-01-28 DIAGNOSIS — F4323 Adjustment disorder with mixed anxiety and depressed mood: Secondary | ICD-10-CM

## 2024-01-28 DIAGNOSIS — Z888 Allergy status to other drugs, medicaments and biological substances status: Secondary | ICD-10-CM | POA: Diagnosis not present

## 2024-01-28 DIAGNOSIS — N136 Pyonephrosis: Secondary | ICD-10-CM | POA: Diagnosis present

## 2024-01-28 DIAGNOSIS — I69354 Hemiplegia and hemiparesis following cerebral infarction affecting left non-dominant side: Secondary | ICD-10-CM | POA: Diagnosis present

## 2024-01-28 DIAGNOSIS — Z83438 Family history of other disorder of lipoprotein metabolism and other lipidemia: Secondary | ICD-10-CM

## 2024-01-28 DIAGNOSIS — G8194 Hemiplegia, unspecified affecting left nondominant side: Secondary | ICD-10-CM

## 2024-01-28 DIAGNOSIS — I69393 Ataxia following cerebral infarction: Secondary | ICD-10-CM | POA: Diagnosis not present

## 2024-01-28 DIAGNOSIS — Z880 Allergy status to penicillin: Secondary | ICD-10-CM | POA: Diagnosis not present

## 2024-01-28 DIAGNOSIS — N179 Acute kidney failure, unspecified: Secondary | ICD-10-CM | POA: Diagnosis not present

## 2024-01-28 DIAGNOSIS — R310 Gross hematuria: Secondary | ICD-10-CM | POA: Diagnosis not present

## 2024-01-28 DIAGNOSIS — Z885 Allergy status to narcotic agent status: Secondary | ICD-10-CM | POA: Diagnosis not present

## 2024-01-28 DIAGNOSIS — M4726 Other spondylosis with radiculopathy, lumbar region: Secondary | ICD-10-CM | POA: Diagnosis present

## 2024-01-28 DIAGNOSIS — G894 Chronic pain syndrome: Secondary | ICD-10-CM | POA: Diagnosis not present

## 2024-01-28 DIAGNOSIS — Z6837 Body mass index (BMI) 37.0-37.9, adult: Secondary | ICD-10-CM

## 2024-01-28 DIAGNOSIS — F449 Dissociative and conversion disorder, unspecified: Secondary | ICD-10-CM | POA: Diagnosis not present

## 2024-01-28 DIAGNOSIS — E669 Obesity, unspecified: Secondary | ICD-10-CM | POA: Diagnosis present

## 2024-01-28 DIAGNOSIS — M47816 Spondylosis without myelopathy or radiculopathy, lumbar region: Secondary | ICD-10-CM | POA: Diagnosis not present

## 2024-01-28 DIAGNOSIS — G8929 Other chronic pain: Secondary | ICD-10-CM | POA: Diagnosis present

## 2024-01-28 DIAGNOSIS — Z8615 Personal history of latent tuberculosis infection: Secondary | ICD-10-CM | POA: Diagnosis not present

## 2024-01-28 DIAGNOSIS — Z79899 Other long term (current) drug therapy: Secondary | ICD-10-CM | POA: Diagnosis not present

## 2024-01-28 DIAGNOSIS — R7303 Prediabetes: Secondary | ICD-10-CM | POA: Diagnosis present

## 2024-01-28 DIAGNOSIS — K5901 Slow transit constipation: Secondary | ICD-10-CM | POA: Diagnosis not present

## 2024-01-28 DIAGNOSIS — M62838 Other muscle spasm: Secondary | ICD-10-CM | POA: Diagnosis present

## 2024-01-28 LAB — BASIC METABOLIC PANEL WITH GFR
Anion gap: 12 (ref 5–15)
BUN: 26 mg/dL — ABNORMAL HIGH (ref 6–20)
CO2: 22 mmol/L (ref 22–32)
Calcium: 10 mg/dL (ref 8.9–10.3)
Chloride: 104 mmol/L (ref 98–111)
Creatinine, Ser: 0.85 mg/dL (ref 0.44–1.00)
GFR, Estimated: >60 mL/min (ref >60)
Glucose, Bld: 130 mg/dL — ABNORMAL HIGH (ref 70–99)
Potassium: 3.6 mmol/L (ref 3.5–5.1)
Sodium: 138 mmol/L (ref 135–145)

## 2024-01-28 MED ORDER — FLEET ENEMA RE ENEM: 1.0000 | ENEMA | Freq: Once | RECTAL | Status: DC | PRN

## 2024-01-28 MED ORDER — HYDROCHLOROTHIAZIDE 25 MG PO TABS
25.0000 mg | ORAL_TABLET | Freq: Every day | ORAL | Status: DC
  Administered 2024-01-29 – 2024-02-08 (×11): 25 mg via ORAL
  Filled 2024-01-28 (×11): qty 1

## 2024-01-28 MED ORDER — ENOXAPARIN SODIUM 60 MG/0.6ML IJ SOSY
50.0000 mg | PREFILLED_SYRINGE | INTRAMUSCULAR | Status: DC
  Administered 2024-01-29 – 2024-02-01 (×3): 50 mg via SUBCUTANEOUS
  Filled 2024-01-28 (×4): qty 0.6

## 2024-01-28 MED ORDER — AMLODIPINE BESYLATE 10 MG PO TABS
10.0000 mg | ORAL_TABLET | Freq: Every day | ORAL | Status: DC
  Administered 2024-01-29 – 2024-02-08 (×11): 10 mg via ORAL
  Filled 2024-01-28 (×11): qty 1

## 2024-01-28 MED ORDER — ALUM & MAG HYDROXIDE-SIMETH 200-200-20 MG/5ML PO SUSP
30.0000 mL | ORAL | Status: AC | PRN
Start: 2024-01-28 — End: ?
  Administered 2024-01-31 – 2024-02-01 (×2): 30 mL via ORAL
  Filled 2024-01-28 (×3): qty 30

## 2024-01-28 MED ORDER — DIPHENHYDRAMINE HCL 25 MG PO CAPS
25.0000 mg | ORAL_CAPSULE | Freq: Four times a day (QID) | ORAL | Status: DC | PRN
  Administered 2024-02-04 – 2024-02-06 (×3): 25 mg via ORAL
  Filled 2024-01-28 (×4): qty 1

## 2024-01-28 MED ORDER — MELATONIN 5 MG PO TABS
5.0000 mg | ORAL_TABLET | Freq: Every evening | ORAL | Status: AC | PRN
Start: 2024-01-28 — End: ?
  Administered 2024-02-03 – 2024-02-05 (×3): 5 mg via ORAL
  Filled 2024-01-28 (×3): qty 1

## 2024-01-28 MED ORDER — ROSUVASTATIN CALCIUM 20 MG PO TABS
20.0000 mg | ORAL_TABLET | Freq: Every day | ORAL | Status: DC
  Administered 2024-01-29 – 2024-02-08 (×11): 20 mg via ORAL
  Filled 2024-01-28 (×11): qty 1

## 2024-01-28 MED ORDER — VITAMIN B-6 25 MG PO TABS
50.0000 mg | ORAL_TABLET | Freq: Every day | ORAL | Status: DC
  Administered 2024-01-29 – 2024-02-08 (×11): 50 mg via ORAL
  Filled 2024-01-28 (×12): qty 2

## 2024-01-28 MED ORDER — ACETAMINOPHEN 325 MG PO TABS
325.0000 mg | ORAL_TABLET | ORAL | Status: DC | PRN
  Administered 2024-01-28 – 2024-02-06 (×11): 650 mg via ORAL
  Filled 2024-01-28 (×11): qty 2

## 2024-01-28 MED ORDER — PROCHLORPERAZINE MALEATE 5 MG PO TABS
5.0000 mg | ORAL_TABLET | Freq: Four times a day (QID) | ORAL | Status: DC | PRN
  Administered 2024-02-03: 10 mg via ORAL
  Filled 2024-01-28: qty 2

## 2024-01-28 MED ORDER — ROSUVASTATIN CALCIUM 20 MG PO TABS
20.0000 mg | ORAL_TABLET | Freq: Every day | ORAL | 0 refills | Status: AC
Start: 2024-01-29 — End: ?

## 2024-01-28 MED ORDER — GUAIFENESIN-DM 100-10 MG/5ML PO SYRP: 5.0000 mL | ORAL_SOLUTION | Freq: Four times a day (QID) | ORAL | Status: DC | PRN

## 2024-01-28 MED ORDER — PROCHLORPERAZINE EDISYLATE 10 MG/2ML IJ SOLN: 5.0000 mg | Freq: Four times a day (QID) | INTRAMUSCULAR | Status: DC | PRN

## 2024-01-28 MED ORDER — POTASSIUM CHLORIDE CRYS ER 20 MEQ PO TBCR
20.0000 meq | EXTENDED_RELEASE_TABLET | Freq: Every day | ORAL | Status: DC
  Administered 2024-01-28 – 2024-02-06 (×9): 20 meq via ORAL
  Filled 2024-01-28 (×12): qty 1

## 2024-01-28 MED ORDER — PYRIDOXINE HCL 50 MG PO TABS
50.0000 mg | ORAL_TABLET | Freq: Every day | ORAL | 0 refills | Status: AC
Start: 2024-01-29 — End: ?

## 2024-01-28 MED ORDER — ISONIAZID 300 MG PO TABS
300.0000 mg | ORAL_TABLET | Freq: Every morning | ORAL | Status: DC
  Administered 2024-01-29 – 2024-02-07 (×10): 300 mg via ORAL
  Filled 2024-01-28 (×11): qty 1

## 2024-01-28 MED ORDER — ASPIRIN 81 MG PO TBEC
81.0000 mg | DELAYED_RELEASE_TABLET | Freq: Every day | ORAL | Status: DC
  Administered 2024-01-29 – 2024-02-08 (×11): 81 mg via ORAL
  Filled 2024-01-28 (×11): qty 1

## 2024-01-28 MED ORDER — FLUTICASONE FUROATE-VILANTEROL 100-25 MCG/ACT IN AEPB
1.0000 | INHALATION_SPRAY | Freq: Every day | RESPIRATORY_TRACT | Status: DC
  Administered 2024-01-29 – 2024-02-08 (×9): 1 via RESPIRATORY_TRACT
  Filled 2024-01-28: qty 28

## 2024-01-28 MED ORDER — DOCUSATE SODIUM 100 MG PO CAPS
100.0000 mg | ORAL_CAPSULE | Freq: Two times a day (BID) | ORAL | 0 refills | Status: AC
Start: 2024-01-28 — End: ?

## 2024-01-28 MED ORDER — ASPIRIN 81 MG PO TBEC: 81.0000 mg | DELAYED_RELEASE_TABLET | Freq: Every day | ORAL | 12 refills | Status: DC

## 2024-01-28 MED ORDER — AMLODIPINE BESYLATE 10 MG PO TABS
10.0000 mg | ORAL_TABLET | Freq: Every day | ORAL | 0 refills | Status: AC
Start: 2024-01-29 — End: ?

## 2024-01-28 MED ORDER — BACLOFEN 10 MG PO TABS
10.0000 mg | ORAL_TABLET | Freq: Three times a day (TID) | ORAL | Status: DC | PRN
  Administered 2024-02-03 – 2024-02-06 (×4): 10 mg via ORAL
  Filled 2024-01-28 (×4): qty 1

## 2024-01-28 MED ORDER — ACETAMINOPHEN 325 MG PO TABS: 975.0000 mg | ORAL_TABLET | Freq: Three times a day (TID) | ORAL | 0 refills | Status: DC | PRN

## 2024-01-28 MED ORDER — PROCHLORPERAZINE 25 MG RE SUPP
12.5000 mg | Freq: Four times a day (QID) | RECTAL | Status: AC | PRN
Start: 2024-01-28 — End: ?

## 2024-01-28 MED ORDER — ISONIAZID 300 MG PO TABS
300.0000 mg | ORAL_TABLET | Freq: Every morning | ORAL | 0 refills | Status: AC
Start: 2024-01-29 — End: ?

## 2024-01-28 MED ORDER — DOCUSATE SODIUM 100 MG PO CAPS
100.0000 mg | ORAL_CAPSULE | Freq: Two times a day (BID) | ORAL | Status: DC
  Administered 2024-01-28 – 2024-02-03 (×12): 100 mg via ORAL
  Filled 2024-01-28 (×12): qty 1

## 2024-01-28 MED ORDER — BISACODYL 10 MG RE SUPP
10.0000 mg | Freq: Every day | RECTAL | Status: AC | PRN
Start: 2024-01-28 — End: ?

## 2024-01-28 MED ORDER — DICLOFENAC SODIUM 1 % EX GEL
2.0000 g | Freq: Four times a day (QID) | CUTANEOUS | Status: DC
  Administered 2024-01-29 – 2024-02-08 (×25): 2 g via TOPICAL
  Filled 2024-01-28: qty 100

## 2024-01-28 NOTE — Progress Notes (Signed)
 Occupational Therapy Treatment Patient Details Name: Jackie Reed MRN: [id number] DOB: April 18, 1966 Today's Date: 01/28/2024   History of present illness Pt is a 58 yo female admitted to [facility] on 01/19/24 for acute onset L sided weakness. She received TNK at [facility], MRI was negative for acute infarcts EEG with no acute findings. PMH of  sarcoidosis, latent TB, migraines and prediabetes.   OT comments  Patient making steady progress towards goals. Patient session focus on cognition and further assessment for functional independence.Patient completing "Mixed Up Grocery List" activity, and able to sort through word bank, complete process of elimination and fix one error without OT assist. Of note, patient is using R hand to write but is typically L handed. OT noting minimal to no tremors when patient was completing activity using L index as a pointer in the wordbank, but tremulous with attempting to write her name. Strategies provided to promote [facility] throughout the day. Patient continues to be an excellent candidate for intensive rehab >3 hours. OT will continue to follow.       If plan is discharge home, recommend the following:  A little help with walking and/or transfers;A lot of help with bathing/dressing/bathroom;Assistance with cooking/housework;Assist for transportation   Equipment Recommendations  Other (comment);Tub/shower seat (RW)    Recommendations for Other Services      Precautions / Restrictions Precautions Precautions: Fall Recall of Precautions/Restrictions: Intact Restrictions Weight Bearing Restrictions Per Provider Order: No       Mobility Bed Mobility               General bed mobility comments: received in recliner    Transfers Overall transfer level: Needs assistance                       Balance Overall balance assessment: Needs assistance Sitting-balance support: No upper extremity supported, Feet supported Sitting balance-Leahy Scale: Fair Sitting  balance - Comments: CGA sitting EOB   Standing balance support: Reliant on assistive device for balance, Bilateral upper extremity supported, During functional activity Standing balance-Leahy Scale: Poor Standing balance comment: reliant on RW                           ADL either performed or assessed with clinical judgement   ADL Overall ADL's : Needs assistance/impaired                                     Functional mobility during ADLs: Minimal assistance;Rolling walker (2 wheels) General ADL Comments: Patient making steady progress towards goals. Patient session focus on cognition and further assessment for functional independence.Patient completing "Mixed Up Grocery List" activity, and able to sort through word bank, complete process of elimination and fix one error without OT assist. Of note, patient is using R hand to write but is typically L handed. OT noting minimal to no tremors when patient was completing activity using L index as a pointer in the wordbank, but tremulous with attempting to write her name. Strategies provided to promote [facility] throughout the day.  Patient continues to be an excellent candidate for intensive rehab >3 hours. OT will continue to follow.    Extremity/Trunk Assessment Upper Extremity Assessment LUE Deficits / Details: continued ataxic movements, appears to exacerbate when stressed or overwhelmed LUE Sensation: WNL LUE Coordination: decreased gross motor;decreased fine motor  Vision       Perception     Praxis     Communication Communication Communication: No apparent difficulties   Cognition Arousal: Alert Behavior During Therapy: WFL for tasks assessed/performed Cognition: Cognition impaired     Awareness: Intellectual awareness intact, Online awareness impaired Memory impairment (select all impairments): Short-term memory Attention impairment (select first level of impairment): Selective  attention Executive functioning impairment (select all impairments): Reasoning OT - Cognition Comments: Completing "Mixed Up Grocery List" activity with only one error, but able to correct without OT prompting                 Following commands: Intact        Cueing   Cueing Techniques: Verbal cues  Exercises      Shoulder Instructions       General Comments      Pertinent Vitals/ Pain       Pain Assessment Pain Assessment: No/denies pain  Home Living                                          Prior Functioning/Environment              Frequency  Min 2X/week        Progress Toward Goals  OT Goals(current goals can now be found in the care plan section)  Progress towards OT goals: Progressing toward goals  Acute Rehab OT Goals Patient Stated Goal: to get to rehab OT Goal Formulation: With patient Time For Goal Achievement: 02/04/24 Potential to Achieve Goals: Good  Plan      Co-evaluation                 AM-PAC OT "6 Clicks" Daily Activity     Outcome Measure   Help from another person eating meals?: A Little Help from another person taking care of personal grooming?: A Little Help from another person toileting, which includes using toliet, bedpan, or urinal?: A Little Help from another person bathing (including washing, rinsing, drying)?: A Lot Help from another person to put on and taking off regular upper body clothing?: A Little Help from another person to put on and taking off regular lower body clothing?: A Little 6 Click Score: 17    End of Session    OT Visit Diagnosis: Unsteadiness on feet (R26.81);Other abnormalities of gait and mobility (R26.89);Ataxia, unspecified (R27.0);Muscle weakness (generalized) (M62.81);Hemiplegia and hemiparesis Hemiplegia - Right/Left: Left Hemiplegia - dominant/non-dominant: Dominant Hemiplegia - caused by: Unspecified   Activity Tolerance Patient tolerated treatment well    Patient Left in chair;with call bell/phone within reach;with family/visitor present   Nurse Communication Mobility status        Time: 1150-1210 OT Time Calculation (min): 20 min  Charges: OT General Charges $OT Visit: 1 Visit OT Treatments $Self Care/Home Management : 8-22 mins  Mollie Anger E. Amaury Kuzel, OTR/L Acute Rehabilitation Services [phone]   Vincent Greek 01/28/2024, 12:50 PM

## 2024-01-28 NOTE — Progress Notes (Signed)
 STROKE TEAM PROGRESS NOTE    SIGNIFICANT HOSPITAL EVENTS 5/28-patient admitted and given TNK for left-sided weakness  INTERIM HISTORY/SUBJECTIVE Patient remains hemodynamically stable and afebrile.  She continues to await approval for CIR.  She reports improvement in left-sided weakness and arm tremor.  OBJECTIVE  CBC    Component Value Date/Time   WBC 5.2 01/22/2024 1228   RBC 6.19 (H) 01/22/2024 1228   HGB 13.7 01/22/2024 1228   HGB 12.0 07/28/2023 1017   HCT 44.7 01/22/2024 1228   HCT 40.9 07/28/2023 1017   PLT 393 01/22/2024 1228   PLT 385 07/28/2023 1017   MCV 72.2 (L) 01/22/2024 1228   MCV 74 (L) 07/28/2023 1017   MCH 22.1 (L) 01/22/2024 1228   MCHC 30.6 01/22/2024 1228   RDW 16.6 (H) 01/22/2024 1228   RDW 15.3 07/28/2023 1017   LYMPHSABS 1.4 01/19/2024 1514   LYMPHSABS 1.0 07/28/2023 1017   MONOABS 0.8 01/19/2024 1514   EOSABS 0.3 01/19/2024 1514   EOSABS 0.2 07/28/2023 1017   BASOSABS 0.0 01/19/2024 1514   BASOSABS 0.0 07/28/2023 1017    BMET    Component Value Date/Time   NA 138 01/20/2024 0502   NA 139 07/28/2023 1017   K 3.7 01/20/2024 0502   CL 106 01/20/2024 0502   CO2 24 01/20/2024 0502   GLUCOSE 100 (H) 01/20/2024 0502   BUN 11 01/20/2024 0502   BUN 13 07/28/2023 1017   CREATININE 0.84 01/22/2024 1228   CREATININE 0.64 01/20/2023 1424   CALCIUM  9.0 01/20/2024 0502   EGFR 92 07/28/2023 1017   GFRNONAA >60 01/22/2024 1228   GFRNONAA >60 01/20/2023 1424    IMAGING past 24 hours No results found.    Vitals:   01/28/24 0324 01/28/24 0741 01/28/24 1006 01/28/24 1105  BP: 116/71 132/78 133/89 (!) 146/66  Pulse: 76 79 98 91  Resp: 17 17 20 17   Temp: 98 F (36.7 C) 97.9 F (36.6 C)  98.7 F (37.1 C)  TempSrc: Oral Oral  Oral  SpO2: 95% 100% 99% 97%  Weight:      Height:         PHYSICAL EXAM General:  Alert, well-nourished, well-developed patient in no acute distress Psych:  Mood and affect appropriate for situation CV: Regular  rate and rhythm on monitor Respiratory:  Regular, unlabored respirations on room air  NEURO:  Mental Status: AA&Ox3, patient is able to give clear and coherent history Speech/Language: speech is without dysarthria or aphasia.    Cranial Nerves:  II: PERRL. Visual fields full.  III, IV, VI: EOMI. Eyelids elevate symmetrically.  V: Sensation is intact to light touch and symmetrical to face.  VII: Face is symmetrical resting and smiling VIII: hearing intact to voice. IX, X: Phonation is normal.  XII: tongue is midline without fasciculations. Motor: 5/5 strength to right upper and lower extremity, 4/5 strength to left upper extremity with shaking drift.  3-4/5 strength in left lower extremity Giveaway weakness with poor and variable effort on the le Tone: is normal and bulk is normal Sensation- Intact to light touch bilaterally.  Gait- deferred  Most Recent NIH  1a Level of Conscious.: 0 1b LOC Questions: 0 1c LOC Commands: 0 2 Best Gaze: 0 3 Visual: 0 4 Facial Palsy: 0 5a Motor Arm - left: 1 5b Motor Arm - Right: 0 6a Motor Leg - Left: 1 6b Motor Leg - Right: 0 7 Limb Ataxia: 1 8 Sensory: 0 9 Best Language: 0 10 Dysarthria: 0 11  Extinct. and Inatten.: 0 TOTAL: 3   ASSESSMENT/PLAN  [name] is a 58 y.o. female with history of, sarcoidosis, latent TB, migraines and prediabetes admitted for acute onset left arm and leg weakness.  Patient received TNK at [facility] with some delays in administration due to hypertension and was transferred here for post TNK care.  MRI was negative for acute infarct, but some left-sided weakness remains.  NIH on Admission 5  Strokelike episode status post TNK Concerning for hypertensive encephalopathy versus conversion disorder Patient had significant left upper and lower extremity giveaway weakness and positive Hoover signs Code Stroke CT head No acute abnormality. Small vessel disease. ASPECTS 10.    CTA head & neck no LVO or  hemodynamically significant stenosis MRI no acute abnormality, chronic small vessel ischemic disease and volume loss CT Head 24 hr- no acute intracranial abnormality  2D Echo EF 60 to 65% LDL 84 HgbA1c 5.5 UDS negative VTE prophylaxis -SCDs No antithrombotic prior to admission, now on ASA 81mg . Continue on discharge Therapy recommendations:  CIR Disposition: Pending  Hypertensive encephalopathy Home meds: Hydrochlorothiazide  25 mg daily Extremely high BP on presentation Currently normotensive Resume home HCTZ 25 Add amlodipine  10 Long-term BP goal normotensive  Hyperlipidemia Home meds: None LDL 84, goal < 70  rosuvastatin  20 mg daily Continue statin at discharge  Other Stroke Risk Factors Obesity, Body mass index is 37.69 kg/m., BMI >/= 30 associated with increased stroke risk, recommend weight loss, diet and exercise as appropriate  Migraine  Other Active Problems Latent TB-continue home isoniazid  and pyridoxine  (of note, patient was taking this medication as needed at home, has been advised to take this medication daily and not as needed) Sarcoidosis, follow-up with pulmonary, not on steroids  Hospital day # 9   01/28/2024 12:32 PM  Patient seen and examined by NP/APP with MD. MD to update note as needed.   Dhruvi Crenshaw E Bucky Cardinal , MSN, AGACNP-BC [facility] See Amion for schedule and pager information 01/28/2024 12:34 PM

## 2024-01-28 NOTE — Discharge Summary (Addendum)
 Stroke Discharge Summary  Patient ID: Jackie Reed   MRN: [id number]      DOB: 01-04-1966  Date of Admission: 01/19/2024 Date of Discharge: 01/28/2024  Attending Physician:  Stroke, Md, MD Consultant(s):    None  Patient's PCP:  Aleta Anda, MD  DISCHARGE PRIMARY DIAGNOSIS:  Strokelike episode status post TNK presenting with left hemiparesis Concerning for hypertensive encephalopathy versus conversion disorder  Patient Active Problem List   Diagnosis Date Noted   Hypertensive encephalopathy 01/20/2024   Medication monitoring encounter 10/01/2023   TB lung, latent 08/27/2023   Sarcoidosis 04/22/2023   Adenopathy 02/09/2023   History of COVID-19 10/03/2020   Hepatitis B carrier (HCC) 07/03/2020   Mass of left hand 10/02/2019   Elevated blood pressure reading in office with diagnosis of hypertension 09/11/2019   Pre-diabetes 06/14/2019   Low back pain without sciatica 03/07/2019   Back muscle spasm 03/07/2019   History of recent fall 12/31/2018   Sprain of right ankle 11/18/2018   Class 2 severe obesity due to excess calories with serious comorbidity and body mass index (BMI) of 35.0 to 35.9 in adult [facility]) 10/31/2018   Essential hypertension, benign 11/08/2015   Tendon calcification 11/08/2015   Adjustment disorder with mixed anxiety and depressed mood 11/08/2015   Insomnia 11/08/2015     Allergies as of 01/28/2024       Reactions   Amoxil  [amoxicillin ] Shortness Of Breath, Palpitations   Morphine  And Codeine Hives, Rash   Ultram  [tramadol ] Nausea And Vomiting        Medication List     TAKE these medications    acetaminophen  325 MG tablet Commonly known as: TYLENOL  Take 3 tablets (975 mg total) by mouth every 8 (eight) hours as needed for headache or moderate pain (pain score 4-6).   amLODipine  10 MG tablet Commonly known as: NORVASC  Take 1 tablet (10 mg total) by mouth daily. Start taking on: January 29, 2024   aspirin  EC 81 MG tablet Take 1 tablet (81  mg total) by mouth daily. Swallow whole. Start taking on: January 29, 2024   docusate sodium 100 MG capsule Commonly known as: COLACE Take 1 capsule (100 mg total) by mouth 2 (two) times daily.   fluticasone  furoate-vilanterol 100-25 MCG/ACT Aepb Commonly known as: Breo Ellipta  Inhale 1 puff into the lungs daily.   hydrochlorothiazide  25 MG tablet Commonly known as: HYDRODIURIL  Take by mouth.   ibuprofen  800 MG tablet Commonly known as: ADVIL  Take 1 tablet (800 mg total) by mouth 3 (three) times daily. What changed:  when to take this reasons to take this   isoniazid  300 MG tablet Commonly known as: NYDRAZID  Take 1 tablet (300 mg total) by mouth in the morning. Start taking on: January 29, 2024 What changed: when to take this   pyridOXINE  50 MG tablet Commonly known as: B-6 Take 1 tablet (50 mg total) by mouth daily. Start taking on: January 29, 2024   rosuvastatin  20 MG tablet Commonly known as: CRESTOR  Take 1 tablet (20 mg total) by mouth daily. Start taking on: January 29, 2024        LABORATORY STUDIES CBC    Component Value Date/Time   WBC 5.2 01/22/2024 1228   RBC 6.19 (H) 01/22/2024 1228   HGB 13.7 01/22/2024 1228   HGB 12.0 07/28/2023 1017   HCT 44.7 01/22/2024 1228   HCT 40.9 07/28/2023 1017   PLT 393 01/22/2024 1228   PLT 385 07/28/2023 1017  MCV 72.2 (L) 01/22/2024 1228   MCV 74 (L) 07/28/2023 1017   MCH 22.1 (L) 01/22/2024 1228   MCHC 30.6 01/22/2024 1228   RDW 16.6 (H) 01/22/2024 1228   RDW 15.3 07/28/2023 1017   LYMPHSABS 1.4 01/19/2024 1514   LYMPHSABS 1.0 07/28/2023 1017   MONOABS 0.8 01/19/2024 1514   EOSABS 0.3 01/19/2024 1514   EOSABS 0.2 07/28/2023 1017   BASOSABS 0.0 01/19/2024 1514   BASOSABS 0.0 07/28/2023 1017   CMP    Component Value Date/Time   NA 138 01/20/2024 0502   NA 139 07/28/2023 1017   K 3.7 01/20/2024 0502   CL 106 01/20/2024 0502   CO2 24 01/20/2024 0502   GLUCOSE 100 (H) 01/20/2024 0502   BUN 11 01/20/2024 0502   BUN  13 07/28/2023 1017   CREATININE 0.84 01/22/2024 1228   CREATININE 0.64 01/20/2023 1424   CALCIUM  9.0 01/20/2024 0502   PROT 8.7 (H) 01/19/2024 1514   PROT 7.9 07/28/2023 1017   ALBUMIN 4.0 01/19/2024 1514   ALBUMIN 4.1 07/28/2023 1017   AST 36 01/19/2024 1514   AST 35 01/20/2023 1424   ALT 21 01/19/2024 1514   ALT 18 01/20/2023 1424   ALKPHOS 213 (H) 01/19/2024 1514   BILITOT 0.7 01/19/2024 1514   BILITOT 0.5 07/28/2023 1017   BILITOT 0.7 01/20/2023 1424   GFRNONAA >60 01/22/2024 1228   GFRNONAA >60 01/20/2023 1424   GFRAA 113 05/27/2020 1211   COAGS Lab Results  Component Value Date   INR 1.1 01/19/2024   INR 1.1 10/21/2018   INR 1.26 11/18/2015   Lipid Panel    Component Value Date/Time   CHOL 155 01/20/2024 0502   CHOL 174 05/27/2020 1211   TRIG 139 01/20/2024 0502   HDL 43 01/20/2024 0502   HDL 50 05/27/2020 1211   CHOLHDL 3.6 01/20/2024 0502   VLDL 28 01/20/2024 0502   LDLCALC 84 01/20/2024 0502   LDLCALC 98 05/27/2020 1211   HgbA1C  Lab Results  Component Value Date   HGBA1C 5.2 01/20/2024   Alcohol Level    Component Value Date/Time   [facility] <15 01/19/2024 1514     SIGNIFICANT DIAGNOSTIC STUDIES MR CERVICAL SPINE WO CONTRAST Result Date: 01/22/2024 CLINICAL DATA:  Initial evaluation for left upper extremity weakness. EXAM: MRI CERVICAL SPINE WITHOUT CONTRAST TECHNIQUE: Multiplanar, multisequence MR imaging of the cervical spine was performed. No intravenous contrast was administered. COMPARISON:  CT from 10/21/2018 FINDINGS: Alignment: Straightening of the normal cervical lordosis. No listhesis. Vertebrae: Vertebral body height maintained without acute or chronic fracture. Bone marrow signal intensity within normal limits. No worrisome osseous lesions. No abnormal marrow edema. Cord: Normal signal and morphology. Posterior Fossa, vertebral arteries, paraspinal tissues: Unremarkable. Disc levels: C2-C3: Small central disc protrusion mildly indents the ventral  thecal sac. No spinal stenosis. Foramina remain patent. C3-C4: Small left paracentral disc protrusion indents the ventral thecal sac. Minimal cord flattening without cord signal changes or significant spinal stenosis. Foramina remain patent. C4-C5: Small central to left paracentral disc protrusion indents the ventral thecal sac. Minimal cord flattening without cord signal changes or significant spinal stenosis. Foramina remain patent. C5-C6: Left paracentral disc protrusion indents and partially faces the ventral thecal sac. Mild flattening of the left hemi cord without cord signal changes or significant spinal stenosis. Foramina remain patent. C6-C7: Small right paracentral disc protrusion indents the right ventral thecal sac. Minimal cord flattening without cord signal changes or significant spinal stenosis. Foramina remain patent. C7-T1: Negative interspace. Mild  left-sided facet hypertrophy. No canal or foraminal stenosis. IMPRESSION: 1. Small central to left paracentral disc protrusions at C2-3 through C5-6 as above. Secondary minor cord flattening without cord signal changes or significant spinal stenosis. Findings could contribute to left upper extremity symptoms. 2. Small right paracentral disc protrusion at C6-7 without significant stenosis. Electronically Signed   By: Virgia Griffins M.D.   On: 01/22/2024 18:07   EEG adult Result Date: 01/22/2024 Arleene Lack, MD     01/22/2024  1:06 PM Patient Name: Jackie Reed MRN: [id number] Epilepsy Attending: Arleene Lack Referring Physician/Provider: Donalynn Fry, MD Date: 01/22/2024 Duration: 22.50 mins Patient history: 58yo F with left sided weakness. EEG to evaluate for seizure Level of alertness: Awake, asleep AEDs during EEG study: None Technical aspects: This EEG study was done with scalp electrodes positioned according to the 10-20 International system of electrode placement. Electrical activity was reviewed with band pass filter of  1-70Hz , sensitivity of 7 uV/mm, display speed of 72mm/sec with a 60Hz  notched filter applied as appropriate. EEG data were recorded continuously and digitally stored.  Video monitoring was available and reviewed as appropriate. Description: No clear posterior dominant rhythm was seen. Sleep was characterized by vertex waves, sleep spindles (12 to 14 Hz), maximal frontocentral region. There is an excessive amount of 15 to 18 Hz beta activity distributed symmetrically and diffusely.  Physiologic photic driving was seen during photic stimulation.  Hyperventilation  was not performed.   ABNORMALITY - Excessive beta, generalized IMPRESSION: This study is within normal limits. The excessive beta activity seen in the background is most likely due to the effect of benzodiazepine and is a benign EEG pattern. No seizures or epileptiform discharges were seen throughout the recording. A normal interictal EEG does not exclude the diagnosis of epilepsy. Priyanka Suzanne Erps   CT HEAD WO CONTRAST ( ) Result Date: 01/20/2024 CLINICAL DATA:  Stroke follow-up 24 hours after tPA administration EXAM: CT HEAD WITHOUT CONTRAST TECHNIQUE: Contiguous axial images were obtained from the base of the skull through the vertex without intravenous contrast. RADIATION DOSE REDUCTION: This exam was performed according to the departmental dose-optimization program which includes automated exposure control, adjustment of the mA and/or kV according to patient size and/or use of iterative reconstruction technique. COMPARISON:  01/19/2024 FINDINGS: Brain: There is no mass, hemorrhage or extra-axial collection. There is generalized atrophy without lobar predilection. Hypodensity of the white matter is most commonly associated with chronic microvascular disease. Vascular: No hyperdense vessel or unexpected vascular calcification. Skull: The visualized skull base, calvarium and extracranial soft tissues are normal. Sinuses/Orbits: No fluid levels or  advanced mucosal thickening of the visualized paranasal sinuses. No mastoid or middle ear effusion. Normal orbits. Other: None. IMPRESSION: 1. No acute intracranial abnormality. 2. Generalized atrophy and findings of chronic microvascular disease. Electronically Signed   By: Juanetta Nordmann M.D.   On: 01/20/2024 20:13   ECHOCARDIOGRAM COMPLETE Result Date: 01/20/2024    ECHOCARDIOGRAM REPORT   Patient Name:   Jackie Reed Date of Exam: 01/20/2024 Medical Rec #:  [id number]     Height:       64.0 in Accession #:    [id number]    Weight:       219.6 lb Date of Birth:  March 27, 1966     BSA:          2.036 m Patient Age:    57 years      BP:  142/83 mmHg Patient Gender: F             HR:           71 bpm. Exam Location:  Inpatient Procedure: 2D Echo, Cardiac Doppler, Color Doppler and Intracardiac            Opacification Agent (Both Spectral and Color Flow Doppler were            utilized during procedure). Indications:    Stroke  History:        Patient has no prior history of Echocardiogram examinations.                 Risk Factors:Hypertension.  Sonographer:    Janette Medley Referring Phys: ERIN C LEHNER IMPRESSIONS  1. Left ventricular ejection fraction, by estimation, is 60 to 65%. The left ventricle has normal function. The left ventricle has no regional wall motion abnormalities. Left ventricular diastolic parameters were normal.  2. Right ventricular systolic function is normal. The right ventricular size is normal.  3. The mitral valve is normal in structure. No evidence of mitral valve regurgitation. No evidence of mitral stenosis.  4. The aortic valve is tricuspid. Aortic valve regurgitation is not visualized. Aortic valve sclerosis/calcification is present, without any evidence of aortic stenosis.  5. The inferior vena cava is normal in size with greater than 50% respiratory variability, suggesting right atrial pressure of 3 mmHg. Conclusion(s)/Recommendation(s): No intracardiac source of embolism  detected on this transthoracic study. Consider a transesophageal echocardiogram to exclude cardiac source of embolism if clinically indicated. FINDINGS  Left Ventricle: Left ventricular ejection fraction, by estimation, is 60 to 65%. The left ventricle has normal function. The left ventricle has no regional wall motion abnormalities. The left ventricular internal cavity size was normal in size. There is  no left ventricular hypertrophy. Left ventricular diastolic parameters were normal. Normal left ventricular filling pressure. Right Ventricle: The right ventricular size is normal. No increase in right ventricular wall thickness. Right ventricular systolic function is normal. Left Atrium: Left atrial size was normal in size. Right Atrium: Right atrial size was normal in size. Pericardium: There is no evidence of pericardial effusion. Mitral Valve: The mitral valve is normal in structure. No evidence of mitral valve regurgitation. No evidence of mitral valve stenosis. Tricuspid Valve: The tricuspid valve is normal in structure. Tricuspid valve regurgitation is not demonstrated. No evidence of tricuspid stenosis. Aortic Valve: The aortic valve is tricuspid. Aortic valve regurgitation is not visualized. Aortic valve sclerosis/calcification is present, without any evidence of aortic stenosis. Pulmonic Valve: The pulmonic valve was normal in structure. Pulmonic valve regurgitation is not visualized. No evidence of pulmonic stenosis. Aorta: The aortic root is normal in size and structure. Venous: The inferior vena cava is normal in size with greater than 50% respiratory variability, suggesting right atrial pressure of 3 mmHg. IAS/Shunts: No atrial level shunt detected by color flow Doppler.  LEFT VENTRICLE PLAX 2D LVIDd:         4.60 cm   Diastology LVIDs:         2.90 cm   LV e' medial:    8.38 cm/s LV PW:         1.00 cm   LV E/e' medial:  10.4 LV IVS:        1.00 cm   LV e' lateral:   9.36 cm/s LVOT diam:     2.00 cm    LV E/e' lateral: 9.3 LV SV:  59 LV SV Index:   29 LVOT Area:     3.14 cm  RIGHT VENTRICLE             IVC RV S prime:     18.30 cm/s  IVC diam: 1.70 cm TAPSE (M-mode): 2.4 cm LEFT ATRIUM             Index        RIGHT ATRIUM           Index LA diam:        2.90 cm 1.42 cm/m   RA Area:     12.50 cm LA Vol (A2C):   37.3 ml 18.32 ml/m  RA Volume:   26.80 ml  13.17 ml/m LA Vol (A4C):   23.6 ml 11.59 ml/m LA Biplane Vol: 31.0 ml 15.23 ml/m  AORTIC VALVE LVOT Vmax:   91.20 cm/s LVOT Vmean:  58.700 cm/s LVOT VTI:    0.187 m  AORTA Ao Root diam: 2.70 cm Ao Asc diam:  3.50 cm MITRAL VALVE MV Area (PHT): 3.65 cm    SHUNTS MV Decel Time: 208 msec    Systemic VTI:  0.19 m MV E velocity: 87.50 cm/s  Systemic Diam: 2.00 cm MV A velocity: 94.30 cm/s MV E/A ratio:  0.93 Gaylyn Keas MD Electronically signed by Gaylyn Keas MD Signature Date/Time: 01/20/2024/12:47:29 PM    Final    MR BRAIN WO CONTRAST Result Date: 01/20/2024 CLINICAL DATA:  Stroke follow-up EXAM: MRI HEAD WITHOUT CONTRAST TECHNIQUE: Multiplanar, multiecho pulse sequences of the brain and surrounding structures were obtained without intravenous contrast. COMPARISON:  01/18/2023 FINDINGS: Brain: No acute infarct, mass effect or extra-axial collection. No acute or chronic hemorrhage. There is multifocal hyperintense T2-weighted signal within the white matter. Generalized volume loss. The midline structures are normal. Vascular: Normal flow voids. Skull and upper cervical spine: Normal calvarium and skull base. Visualized upper cervical spine and soft tissues are normal. Sinuses/Orbits:No paranasal sinus fluid levels or advanced mucosal thickening. No mastoid or middle ear effusion. Normal orbits. IMPRESSION: 1. No acute intracranial abnormality. 2. Findings of chronic small vessel ischemia and volume loss. Electronically Signed   By: Juanetta Nordmann M.D.   On: 01/20/2024 01:30   CT ANGIO HEAD NECK W WO CM (CODE STROKE) Result Date:  01/19/2024 CLINICAL DATA:  Stroke, follow up.  Left-sided weakness. EXAM: CT ANGIOGRAPHY HEAD AND NECK WITH AND WITHOUT CONTRAST TECHNIQUE: Multidetector CT imaging of the head and neck was performed using the standard protocol during bolus administration of intravenous contrast. Multiplanar CT image reconstructions and MIPs were obtained to evaluate the vascular anatomy. Carotid stenosis measurements (when applicable) are obtained utilizing NASCET criteria, using the distal internal carotid diameter as the denominator. RADIATION DOSE REDUCTION: This exam was performed according to the departmental dose-optimization program which includes automated exposure control, adjustment of the mA and/or kV according to patient size and/or use of iterative reconstruction technique. CONTRAST:  75mL OMNIPAQUE  IOHEXOL  350 MG/ML SOLN COMPARISON:  Head CT earlier today and MRI 01/18/2023 FINDINGS: CT HEAD FINDINGS Brain: There is no evidence of an acute infarct, intracranial hemorrhage, mass, midline shift, or extra-axial fluid collection. Cerebral volume is within normal limits. The ventricles are normal in size. Cerebral white matter hypodensities are unchanged and nonspecific but compatible with mild chronic small vessel ischemic disease. Vascular: No hyperdense vessel. Skull: No fracture or suspicious lesion. Sinuses/Orbits: Small mucous retention cyst in the left maxillary sinus. Clear mastoid air cells. Unremarkable orbits. Other: None. Review of the MIP  images confirms the above findings CTA NECK FINDINGS Aortic arch: Standard branching. Widely patent brachiocephalic and subclavian arteries. Right carotid system: Patent without evidence of stenosis, dissection, or significant atherosclerosis. Left carotid system: Patent without evidence of stenosis, dissection, or significant atherosclerosis. Vertebral arteries: Patent and codominant without evidence of stenosis, dissection, or significant atherosclerosis. Skeleton: No acute  osseous abnormality or suspicious lesion. Other neck: No evidence of cervical lymphadenopathy or mass. Upper chest: Clear lung apices. Review of the MIP images confirms the above findings CTA HEAD FINDINGS Anterior circulation: The internal carotid arteries are widely patent from skull base to carotid termini. ACAs and MCAs are patent without evidence of a proximal branch occlusion or significant proximal stenosis. No aneurysm is identified. Posterior circulation: The intracranial vertebral arteries are widely patent to the basilar. Patent AICA and SCA origins are visualized bilaterally. The basilar artery is widely patent. There is a large right posterior communicating artery with hypoplasia of the right P1 segment. Both PCAs are patent without evidence of a significant proximal stenosis. No aneurysm is identified. Venous sinuses: As permitted by contrast timing, patent. Anatomic variants: Significant fetal type supply of the right PCA. Review of the MIP images confirms the above findings These results were communicated to Dr. Arora at 4:04 pm on 01/19/2024 by text page via the [facility] messaging system. IMPRESSION: 1. No evidence of acute intracranial abnormality. 2. No large vessel occlusion or significant stenosis in the head and neck. Electronically Signed   By: Aundra Lee M.D.   On: 01/19/2024 16:10   CT HEAD CODE STROKE WO CONTRAST Result Date: 01/19/2024 CLINICAL DATA:  Code stroke.  Neuro deficit, left-sided weakness. EXAM: CT HEAD WITHOUT CONTRAST TECHNIQUE: Contiguous axial images were obtained from the base of the skull through the vertex without intravenous contrast. RADIATION DOSE REDUCTION: This exam was performed according to the departmental dose-optimization program which includes automated exposure control, adjustment of the mA and/or kV according to patient size and/or use of iterative reconstruction technique. COMPARISON:  MRI head 01/18/2023. FINDINGS: Brain: No acute intracranial hemorrhage.  No CT evidence of acute infarct. Nonspecific hypoattenuation in the periventricular and subcortical white matter favored to reflect chronic microvascular ischemic changes. No edema, mass effect, or midline shift. The basilar cisterns are patent. Ventricles: The ventricles are normal. Vascular: No hyperdense vessel or unexpected calcification. Skull: No acute or aggressive finding. Orbits: Orbits are symmetric. Sinuses: The visualized paranasal sinuses are clear. Other: Mastoid air cells are clear. ASPECTS [facility] Stroke Program Early CT Score) - Ganglionic level infarction (caudate, lentiform nuclei, internal capsule, insula, M1-M3 cortex): 7 - Supraganglionic infarction (M4-M6 cortex): 3 Total score (0-10 with 10 being normal): 10 IMPRESSION: 1. No CT evidence of acute intracranial abnormality. 2. Mild chronic microvascular ischemic changes. 3. ASPECTS is 10 These results were communicated to Dr. Arora at 3:21 pm on 01/19/2024 by text page via the [facility] messaging system. Electronically Signed   By: Denny Flack M.D.   On: 01/19/2024 15:21       HISTORY OF PRESENT ILLNESS 58 y.o. patient with history of sarcoidosis, latent TB, migraines and prediabetes was admitted with acute onset left arm and leg weakness.  HOSPITAL COURSE Patient was administered TNK to treat potential stroke.  However, MRI was negative for acute infarct.  Patient had remaining left-sided weakness and tremor of the left arm.  On exam she has highly variable strength with increased tone and tremors on the left which vary with distractibility.  There was concern for hypertensive encephalopathy versus  functional neurological disorder.  She is now stable and ready to be discharged to rehabilitation.  Of note, patient reported on admission that she was taking her isoniazid  as needed instead of every day.  She has been counseled to take this medication every day.  Strokelike episode status post TNK Concerning for hypertensive encephalopathy  versus conversion disorder Patient had significant left upper and lower extremity giveaway weakness and positive Hoover signs Code Stroke CT head No acute abnormality. Small vessel disease. ASPECTS 10.    CTA head & neck no LVO or hemodynamically significant stenosis MRI no acute abnormality, chronic small vessel ischemic disease and volume loss CT Head 24 hr- no acute intracranial abnormality  2D Echo EF 60 to 65% LDL 84 HgbA1c 5.5 UDS negative VTE prophylaxis -SCDs No antithrombotic prior to admission, now on ASA 81mg . Continue on discharge Therapy recommendations:  CIR Disposition: CIR   Hypertensive encephalopathy Home meds: Hydrochlorothiazide  25 mg daily Extremely high BP on presentation Currently normotensive Resume home HCTZ 25 Add amlodipine  10 Long-term BP goal normotensive   Hyperlipidemia Home meds: None LDL 84, goal < 70  rosuvastatin  20 mg daily Continue statin at discharge   Other Stroke Risk Factors Obesity, Body mass index is 37.69 kg/m., BMI >/= 30 associated with increased stroke risk, recommend weight loss, diet and exercise as appropriate  Migraine   Other Active Problems Latent TB-continue home isoniazid  and pyridoxine  (of note, patient was taking this medication as needed at home, has been advised to take this medication daily and not as needed) Sarcoidosis, follow-up with pulmonary, not on steroids  RN Pressure Injury Documentation:     DISCHARGE EXAM  PHYSICAL EXAM General:  Alert, well-nourished, well-developed patient in no acute distress Psych:  Mood and affect appropriate for situation CV: Regular rate and rhythm on monitor Respiratory:  Regular, unlabored respirations on room air  NEURO:  Mental Status: AA&Ox3  Speech/Language: speech is without dysarthria or aphasia.    Cranial Nerves:  II: PERRL.  Visual fields full III, IV, VI: EOMI. Eyelids elevate symmetrically.  V: Sensation is intact to light touch and symmetrical to face.   VII: Smile is symmetrical.  VIII: hearing intact to voice. IX, X: Phonation is normal.  OZ:[id number] shrug stronger on the right XII: tongue is midline without fasciculations. Motor: Able to move all 4 extremities with good antigravity strength, right stronger than left Tone: is normal and bulk is normal Sensation- Intact to light touch bilaterally. Extinction absent to light touch to DSS. Coordination: FTN some ataxia on the left Gait- deferred  1a Level of Conscious.: 0 1b LOC Questions: 0 1c LOC Commands: 0 2 Best Gaze: 0 3 Visual: 0 4 Facial Palsy: 0 5a Motor Arm - left: 1 5b Motor Arm - Right: 0 6a Motor Leg - Left: 0 6b Motor Leg - Right: 0 7 Limb Ataxia: 1 8 Sensory: 0 9 Best Language: 0 10 Dysarthria: 0 11 Extinct. and Inatten.: 0 TOTAL: 2   Discharge Diet       Diet   Diet regular Room service appropriate? Yes; Fluid consistency: Thin   liquids  DISCHARGE PLAN Disposition: Rehab aspirin  81 mg daily for stroke prevention  Ongoing stroke risk factor control by Primary Care Physician at time of discharge Follow-up PCP Aleta Anda, MD in 2 weeks. Follow-up in [facility] in 8 weeks, office to schedule an appointment.   32 minutes were spent preparing discharge.  Cortney E Bucky Cardinal , MSN, AGACNP-BC  [facility] See Amion for schedule and pager information 01/28/2024 1:51 PM  I have personally obtained history,examined this patient, reviewed notes, independently viewed imaging studies, participated in medical decision making and plan of care.ROS completed by me personally and pertinent positives fully documented  I have made any additions or clarifications directly to the above note. Agree with note above.    Ardella Beaver, MD Medical Director [facility] Pager: [phone] 01/28/2024 2:16 PM

## 2024-01-28 NOTE — TOC Transition Note (Signed)
 Transition of Care [facility]) - Discharge Note   Patient Details  Name: Jackie Reed MRN: [id number] Date of Birth: 07-04-1966  Transition of Care [facility]) CM/SW Contact:  Jonathan Neighbor, RN Phone Number: 01/28/2024, 1:26 PM   Clinical Narrative:     Pt is discharging to CIR today. CM signing off.  Final next level of care: IP Rehab Facility Barriers to Discharge: No Barriers Identified   Patient Goals and CMS Choice   CMS Medicare.gov Compare Post Acute Care list provided to:: Patient Choice offered to / list presented to : Patient      Discharge Placement                       Discharge Plan and Services Additional resources added to the After Visit Summary for     Discharge Planning Services: CM Consult Post Acute Care Choice: IP Rehab                               Social Drivers of Health (SDOH) Interventions SDOH Screenings   Food Insecurity: No Food Insecurity (01/19/2024)  Housing: Unknown (01/19/2024)  Transportation Needs: No Transportation Needs (01/19/2024)  Utilities: Not At Risk (01/19/2024)  Depression (PHQ2-9): Low Risk  (08/27/2023)  Financial Resource Strain: Low Risk  (09/28/2023)   Received from [facility]  Social Connections: Unknown (12/22/2021)   Received from [facility]  Tobacco Use: Low Risk  (01/19/2024)     Readmission Risk Interventions     No data to display

## 2024-01-28 NOTE — Progress Notes (Signed)
 Physical Therapy Treatment Patient Details Name: Jackie Reed MRN: [id number] DOB: 06-Jun-1966 Today's Date: 01/28/2024   History of Present Illness Pt is a 58 yo female admitted to [facility] on 01/19/24 for acute onset L sided weakness. She received TNK at [facility], MRI was negative for acute infarcts EEG with no acute findings. PMH of  sarcoidosis, latent TB, migraines and prediabetes.    PT Comments  Pt admitted with above diagnosis. Pt was able to ambulate with RW with continued need for min assist and +1 for safety as she needs assist and cues for sequencing steps and RW and also needs a chair follow for safety. Pt with incr tremor left hemibody as she fatigues. Will continue to progress pt as able. She did incr distance today.  Pt currently with functional limitations due to the deficits listed below (see PT Problem List). Pt will benefit from acute skilled PT to increase their independence and safety with mobility to allow discharge.       If plan is discharge home, recommend the following: A little help with walking and/or transfers;Assistance with cooking/housework;Assist for transportation;Help with stairs or ramp for entrance   Can travel by private vehicle        Equipment Recommendations  Rolling walker (2 wheels);BSC/3in1    Recommendations for Other Services Rehab consult     Precautions / Restrictions Precautions Precautions: Fall Recall of Precautions/Restrictions: Intact Restrictions Weight Bearing Restrictions Per Provider Order: No     Mobility  Bed Mobility               General bed mobility comments: received in recliner    Transfers Overall transfer level: Needs assistance Equipment used: Rolling walker (2 wheels) Transfers: Sit to/from Stand Sit to Stand: Min assist           General transfer comment: min Cues for hand placement, able to complete min A solely for safety, increased time required as pt is slow to rise    Ambulation/Gait Ambulation/Gait  assistance: Min assist, +2 safety/equipment Gait Distance (Feet): 60 Feet (45 feet then 60 feet) Assistive device: Rolling walker (2 wheels) Gait Pattern/deviations: Step-to pattern, Ataxic, Decreased step length - left, Decreased stance time - left Gait velocity: decreased Gait velocity interpretation: <1.31 ft/sec, indicative of household ambulator   General Gait Details: Very slow gait with poor floor clearance, step to gait pattern with decreased step length and stance time on the L with intermittent ataxic movements of the LLE with poor progression anteriorly. Needed assist to move left LE at times and also needs constant cues for sequencing steps. Pt with tremor at times in left LE and UE which worsened the longer she ambulated. Pt has to have sitting rest break when she feels fatigued. Cues for longer step length on LLE.   Stairs             Wheelchair Mobility     Tilt Bed    Modified Rankin (Stroke Patients Only)       Balance Overall balance assessment: Needs assistance Sitting-balance support: No upper extremity supported, Feet supported Sitting balance-Leahy Scale: Fair Sitting balance - Comments: CGA sitting EOB   Standing balance support: Reliant on assistive device for balance, Bilateral upper extremity supported, During functional activity Standing balance-Leahy Scale: Poor Standing balance comment: reliant on RW and external support                            Communication Communication  Communication: No apparent difficulties  Cognition Arousal: Alert Behavior During Therapy: WFL for tasks assessed/performed   PT - Cognitive impairments: No apparent impairments                         Following commands: Intact      Cueing Cueing Techniques: Verbal cues  Exercises General Exercises - Upper Extremity Shoulder Flexion: AROM, Both, 10 reps, Seated Shoulder Horizontal ADduction: AROM, Both, 10 reps, Seated Elbow Flexion: AROM,  Both, 10 reps, Seated Elbow Extension: AROM, Both, 10 reps, Seated General Exercises - Lower Extremity Ankle Circles/Pumps: AROM, Both, 10 reps, Supine Quad Sets: AROM, Both, 10 reps, Supine Gluteal Sets: AROM, Both, 10 reps, Supine Long Arc Quad: AROM, Both, 10 reps, Seated Hip Flexion/Marching: AROM, Both, 5 reps, Seated    General Comments General comments (skin integrity, edema, etc.): VSS      Pertinent Vitals/Pain Pain Assessment Pain Assessment: No/denies pain Faces Pain Scale: No hurt Breathing: normal Negative Vocalization: none Facial Expression: smiling or inexpressive Body Language: relaxed Consolability: no need to console PAINAD Score: 0    Home Living                          Prior Function            PT Goals (current goals can now be found in the care plan section) Progress towards PT goals: Progressing toward goals    Frequency    Min 2X/week      PT Plan      Co-evaluation              AM-PAC PT "6 Clicks" Mobility   Outcome Measure  Help needed turning from your back to your side while in a flat bed without using bedrails?: A Little Help needed moving from lying on your back to sitting on the side of a flat bed without using bedrails?: A Little Help needed moving to and from a bed to a chair (including a wheelchair)?: A Little Help needed standing up from a chair using your arms (e.g., wheelchair or bedside chair)?: A Little Help needed to walk in hospital room?: A Lot Help needed climbing 3-5 steps with a railing? : Total 6 Click Score: 15    End of Session Equipment Utilized During Treatment: Gait belt Activity Tolerance: Patient tolerated treatment well;Patient limited by fatigue Patient left: in chair;with call bell/phone within reach;with chair alarm set Nurse Communication: Mobility status PT Visit Diagnosis: Unsteadiness on feet (R26.81);Other abnormalities of gait and mobility (R26.89)     Time:  1610-9604 PT Time Calculation (min) (ACUTE ONLY): 22 min  Charges:    $Gait Training: 8-22 mins PT General Charges $$ ACUTE PT VISIT: 1 Visit                     Eartha Vonbehren M,PT Acute Rehab Services [phone]    Florencia Hunter 01/28/2024, 2:22 PM

## 2024-01-28 NOTE — Plan of Care (Signed)

## 2024-01-28 NOTE — H&P (Addendum)
 Physical Medicine and Rehabilitation Admission H&P        Chief Complaint  Patient presents with   Functional deficits    HPI:  Jackie Reed is a 58 year old LH-female with history of HTN, Sarcoidosis, hepatitis, migraines, LBP, Latent TB (positive QuantiFERON test) who was admitted on 01/19/24 with sudden onset of LUE/LLE weakness and HA. CT head negative BP and patient reported elevated  SBP @ 220 at home.  UDS negative. CT head negative. CTA negative for LVO. She received TNKase  and elevated BP treated with labetalol  and hydralazine .  MRI brain negative but she continued to be limited by mild left sided weakness.  EEG negative for seizures. 2 D echo showed EF 60-65% with no wall abnormality and AV sclerosis. Dr. Christiane Cowing questioned HTN encephalopathy v/s conversion d/o due to significant LUE and LLE giveaway weakness and positive Hoover signs. Now on low dose ASA and to continue at discharge. Crestor  added for LDL 84 w/goal <70.   Therapy has been working with patient who has mild cognitive deficits with SLUMS score 21/30 requires min to mod assist overall,has tremors with increase in activity and intermittent ataxic movements. CIR recommended due to functional decline. 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       Review of Systems  Constitutional: Negative.   HENT: Negative.    Eyes: Negative.   Respiratory: Negative.  Negative for cough, sputum production, shortness of breath and wheezing.   Cardiovascular: Negative.  Negative for chest pain, palpitations and leg swelling.  Gastrointestinal: Negative.  Negative for abdominal pain, blood in stool, heartburn, nausea and vomiting.       Reports bloating, LBM 6/3   Genitourinary: Negative.   Musculoskeletal:  Positive for back pain. Negative for myalgias and neck pain.       Chronic lower back pain resolved with tylenol   Skin: Negative.   Neurological:  Positive for focal weakness (LUE/LLE).  Negative for dizziness, tingling, tremors, sensory change, speech change, seizures, loss of consciousness and headaches.  Endo/Heme/Allergies: Negative.   Psychiatric/Behavioral: Negative.              Past Medical History:  Diagnosis Date   Dyspnea      occasional   E-coli UTI 08/2019   Hepatitis 05/2020    Hepatitis B core antibody positive   Hypertension     Migraine     Plantar fibromatosis     PONV (postoperative nausea and vomiting)     Pre-diabetes     Sarcoidosis     TB lung, latent     Thyroid  nodule 01/29/2023    seen on PET scan   Vitamin B12 deficiency 06/2019   Vitamin D  deficiency 06/2019               Past Surgical History:  Procedure Laterality Date   ABDOMINAL HYSTERECTOMY       FINE NEEDLE ASPIRATION   02/19/2023    Procedure: FINE NEEDLE ASPIRATION (FNA) LINEAR;  Surgeon: Prudy Brownie, DO;  Location: [facility];  Service: Cardiopulmonary;;   FOOT SURGERY Bilateral 1985   MASS EXCISION Right 05/19/2018    Procedure: EXCISION PALMAR NODULES RIGHT HAND;  Surgeon: Brunilda Capra, MD;  Location: [facility];  Service: Orthopedics;  Laterality: Right;   PARTIAL HYSTERECTOMY   1997   VIDEO BRONCHOSCOPY WITH ENDOBRONCHIAL ULTRASOUND Bilateral 02/19/2023    Procedure: VIDEO BRONCHOSCOPY WITH ENDOBRONCHIAL ULTRASOUND;  Surgeon: Thelda Finney,  Lucie Ruts, DO;  Location: [facility];  Service: Cardiopulmonary;  Laterality: Bilateral;               Family History  Problem Relation Age of Onset   Heart failure Mother     Diabetes Mother     Heart failure Father     Hypertension Father     Hyperlipidemia Father     Colon cancer Neg Hx     Colon polyps Neg Hx     Breast cancer Neg Hx     Crohn's disease Neg Hx     Esophageal cancer Neg Hx     Rectal cancer Neg Hx     Stomach cancer Neg Hx     Ulcerative colitis Neg Hx            Social History: Married.  reports that she has never smoked. She has never used smokeless tobacco. She reports  that she does not drink alcohol and does not use drugs.     Allergies      Allergies  Allergen Reactions   Amoxil  [Amoxicillin ] Shortness Of Breath and Palpitations   Morphine  And Codeine Hives and Rash   Ultram  [Tramadol ] Nausea And Vomiting              Medications Prior to Admission  Medication Sig Dispense Refill   fluticasone  furoate-vilanterol (BREO ELLIPTA ) 100-25 MCG/ACT AEPB Inhale 1 puff into the lungs daily. 30 each 5   hydrochlorothiazide  (HYDRODIURIL ) 25 MG tablet Take by mouth.       ibuprofen  (ADVIL ) 800 MG tablet Take 1 tablet (800 mg total) by mouth 3 (three) times daily. (Patient taking differently: Take 800 mg by mouth 3 (three) times daily as needed (back pain).) 21 tablet 0   isoniazid  (NYDRAZID ) 300 MG tablet Take 1 tablet (300 mg total) by mouth daily. (Patient taking differently: Take 300 mg by mouth daily as needed ("lung flares").) 30 tablet 8            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, Sports coach  Lives With: Spouse   Functional History: Prior Function Prior Level of Function : Independent/Modified Independent, Driving, Working/employed, History of Falls (last six months) Mobility Comments: ind ADLs Comments: ind   Functional Status:  Mobility: Bed Mobility Overal bed mobility: Needs Assistance Bed Mobility: Supine to Sit Supine to sit: Contact guard General bed mobility  comments: received in recliner Transfers Overall transfer level: Needs assistance Equipment used: Rolling walker (2 wheels) Transfers: Sit to/from Stand Sit to Stand: Min assist Bed to/from chair/wheelchair/BSC transfer type:: Step pivot Step pivot transfers: Min assist General transfer comment: min Cues for hand placement, able to complete min A solely for safety, increased time required as pt is slow to rise Ambulation/Gait Ambulation/Gait assistance: Min assist, +2 safety/equipment Gait Distance (Feet): 60 Feet (45 feet then 60 feet) Assistive device: Rolling walker (2 wheels) Gait Pattern/deviations: Step-to pattern, Ataxic, Decreased step length - left, Decreased stance time - left General Gait Details: Very slow gait with poor floor clearance, step to gait pattern with decreased  step length and stance time on the L with intermittent ataxic movements of the LLE with poor progression anteriorly. Needed assist to move left LE at times and also needs constant cues for sequencing steps. Pt with tremor at times in left LE and UE which worsened the longer she ambulated. Pt has to have sitting rest break when she feels fatigued. Cues for longer step length on LLE. Gait velocity: decreased Gait velocity interpretation: <1.31 ft/sec, indicative of household ambulator   ADL: ADL Overall ADL's : Needs assistance/impaired Eating/Feeding: Sitting, Minimal assistance Eating/Feeding Details (indicate cue type and reason): min A, unable to hold utensils with dominant LUE. LUE ataxic, has been using RUE to eat- ordering simple hand held meals. Grooming: Wash/dry hands, Wash/dry face, Oral care, Sitting, Standing, Minimal assistance Grooming Details (indicate cue type and reason): pt. able to stand for washing hands, guiding L hand and allowing it to reduce to minimal ataxic movements then guiding R hand to help wash it, pt. able to also take L hand and guide it to wash R also.  washed face primarily with  R hand.  for oral care pt. performed in sitting due to fatigue from standing.  utilized basin, and cup for rinsing. Upper Body Bathing: Sitting, Minimal assistance Lower Body Bathing: Moderate assistance, Sitting/lateral leans Upper Body Dressing : Moderate assistance, Sitting Lower Body Dressing: Contact guard assist, Bed level Lower Body Dressing Details (indicate cue type and reason): able to don L sock Toilet Transfer: Minimal assistance, Rolling walker (2 wheels), Ambulation, Cueing for sequencing, Cueing for safety Toilet Transfer Details (indicate cue type and reason): bed to chair Toileting- Clothing Manipulation and Hygiene: Moderate assistance, Sit to/from stand Functional mobility during ADLs: Minimal assistance, Rolling walker (2 wheels) General ADL Comments: Patient making steady progress towards goals. Patient session focus on cognition and further assessment for functional independence.Patient completing "Mixed Up Grocery List" activity, and able to sort through word bank, complete process of elimination and fix one error without OT assist. Of note, patient is using R hand to write but is typically L handed. OT noting minimal to no tremors when patient was completing activity using L index as a pointer in the wordbank, but tremulous with attempting to write her name. Strategies provided to promote [facility] throughout the day.  Patient continues to be an excellent candidate for intensive rehab >3 hours. OT will continue to follow.   Cognition: Cognition Overall Cognitive Status: Impaired/Different from baseline Arousal/Alertness: Awake/alert Orientation Level: Oriented X4 Attention: Sustained Sustained Attention: Impaired Sustained Attention Impairment: Verbal complex Memory: Appears intact Awareness: Appears intact Problem Solving: Impaired Problem Solving Impairment: Verbal complex Safety/Judgment: Appears intact Cognition Arousal: Alert Behavior During Therapy: WFL for tasks  assessed/performed Overall Cognitive Status: Impaired/Different from baseline     Blood pressure (!) 146/66, pulse 91, temperature 98.7 F (37.1 C), temperature source Oral, resp. rate 17, height 5\' 4"  (1.626 m), weight 99.6 kg, SpO2 97%.     Physical Exam Vitals reviewed.  Constitutional: No apparent distress. Appropriate appearance for age.  Obese. HENT: No JVD. Neck Supple. Trachea midline. Atraumatic, normocephalic. Eyes: PERRLA. EOMI. Visual fields grossly intact.  Cardiovascular: RRR, no murmurs/rub/gallops. No Edema. Peripheral pulses 2+  Respiratory: CTAB. No rales, rhonchi, or wheezing. On RA.  Abdomen: + bowel sounds, normoactive.  Mild distention, no tenderness GU: Not examined.   Skin: C/D/I. No apparent lesions. MSK:      No apparent deformity.  Full PROM.        Neurologic exam:  Cognition:  AAO to person, place, time and event.  Language: Fluent, No substitutions or neoglisms. No dysarthria. Names 3/3 objects correctly.  Memory: Recalls 3/3 objects at 5 minutes. No apparent deficits  Insight: Good  insight into current condition.  Mood: Pleasant affect, appropriate mood.  Some tearfulness with intermittent spasms. Sensation: To light touch reduced throughout right thigh, not extending past the knee. Reflexes: 2+ in BL UE and LEs. Negative Hoffman's and babinski signs bilaterally.  CN: 2-12 grossly intact.  Coordination: LUE ataxia; motor apraxia LUE and LLE      Strength:                RUE: 5/5 SA, 5/5 EF, 5/5 EE, 5/5 WE, 5/5 FF, 5/5 FA                LUE:  3/5 SA, 3/5 EF, 3/5 EE, 3/5 WE, 4/5 FF, 4/5 FA                RLE: 5/5 HF, 5/5 KE, 5/5  DF, 5/5  EHL, 5/5  PF                 LLE:  1/5 HF, 1/5 KE, 2/5  DF, 1/5  EHL, 2/5  PF             Lab Results Last 48 Hours  No results found for this or any previous visit (from the past 48 hours).   Imaging Results (Last 48 hours)  No results found.         Blood pressure (!) 146/66, pulse 91, temperature  98.7 F (37.1 C), temperature source Oral, resp. rate 17, height 5\' 4"  (1.626 m), weight 99.6 kg, SpO2 97%.   Medical Problem List and Plan: 1. Functional deficits secondary to left hemiparesis secondary to hypertensive encephalopathy versus conversion disorder             -patient may shower             -ELOS/Goals: 7 to 10 days, supervision PT/OT, mod I SLP  - Stable for admission to inpatient rehab  2.  Antithrombotics: -DVT/anticoagulation:  Pharmaceutical: Lovenox             -antiplatelet therapy: asa 3. Pain Management: tylenol  prn 4. Mood/Behavior/Sleep: LCSW to follow             -antipsychotic agents:  5. Neuropsych/cognition: This patient is capable of making decisions on her own behalf. 6. Skin/Wound Care: Routine pressure relief measures.  7. Fluids/Electrolytes/Nutrition: Monitor I/O. Check CMET in am 8. HTN: Monitor BP TID--continue amlodipine  and HCTX 9. Sarcoidosis/Dyspnea: On breo with improvement in cough. Follwed by Dr. Alver Jobs. 10. Latent TB: Continue INH and pyridoxine  daily. Educate on importance of compliance. 11. Chronic Lumbar facet syndrome/Lumbar radiculopathy: Managed with local measures and s/p median branch block 07/2023 per [facility]               --Voltaren  gel added.   - Reviewed chart; similar presentation to her pain management presentations since 2020.  Has failed cyclobenzaprine  (Flexeril ), gabapentin  (Neurontin , Gralise ), Reports mild benefit with use of Diclofenac  but not has not used recently.   - Lumbar MRI 09/21/23:  1. Mild anterolisthesis of L4-L5 with possible bilateral pars defects at this level.  2. Multilevel lumbar spondylosis, most pronounced at the L4-L5 and L5-S1 levels with marked facet arthropathy. There is mild spinal canal stenosis and subarticular recess narrowing at L4-L5 with disc material likely contacting the bilateral descending L5  nerve roots as described.  3. No substantial foraminal stenosis at any of the  lumbar levels.    - Add baclofen 10 mg 3 times daily as needed for muscle spasms  12. Hypokalemia: K-3.1 at admission. Is on hydrochlorothiazide .  --Will add  daily supplement for now.  13. Obesity: BMI 37.7. Educated on importance of weight loss and mobility to help promote overall health.      Zelda Hickman, PA-C 01/28/2024  I have examined the patient independently and edited the note for HPI, ROS, exam, assessment, and plan as appropriate. I am in agreement with the above recommendations.   Bea Lime, DO 01/28/2024

## 2024-01-28 NOTE — Progress Notes (Signed)
 Inpatient Rehabilitation Admissions Coordinator   I have insurance approval and CIR bed to admit her to today. Acute team and TOC made aware. I will make the arrangements.  Jeannetta Millman, RN, MSN Rehab Admissions Coordinator [phone] 01/28/2024 1:34 PM

## 2024-01-28 NOTE — H&P (Shared)
 Physical Medicine and Rehabilitation Admission H&P    Chief Complaint  Patient presents with   Functional deficits   HPI:  Jackie Reed is a 58 year old LH-female with history of HTN, Sarcoidosis, hepatitis, migraines, LBP, Latent TB (positive QuantiFERON test) who was admitted on 01/19/24 with sudden onset of LUE/LLE weakness and HA. CT head negative BP and patient reported elevated  SBP @ 220 at home.  UDS negative. CT head negative. CTA negative for LVO. She received TNKase  and elevated BP treated with labetalol  and hydralazine .  MRI brain negative but she continued to be limited by mild left sided weakness.  EEG negative for seizures. 2 D echo showed EF 60-65% with no wall abnormality and AV sclerosis. Dr. Christiane Cowing questioned HTN encephalopathy v/s conversion d/o due to significant LUE and LLE giveaway weakness and positive Hoover signs. Now on low dose ASA and to continue at discharge. Crestor  added for LDL 84 w/goal <70.  Therapy has been working with patient who has mild cognitive deficits with SLUMS score 21/30 requires min to mod assist overall,has tremors with increase in activity and intermittent ataxic movements. CIR recommended due to functional decline. 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     Review of Systems  Constitutional: Negative.   HENT: Negative.    Eyes: Negative.   Respiratory: Negative.  Negative for cough, sputum production, shortness of breath and wheezing.   Cardiovascular: Negative.  Negative for chest pain, palpitations and leg swelling.  Gastrointestinal: Negative.  Negative for abdominal pain, blood in stool, heartburn, nausea and vomiting.       Reports bloating, LBM 6/3   Genitourinary: Negative.   Musculoskeletal:  Positive for back pain. Negative for myalgias and neck pain.       Chronic lower back pain resolved with tylenol   Skin: Negative.   Neurological:  Positive for focal weakness (LUE/LLE). Negative for  dizziness, tingling, tremors, sensory change, speech change, seizures, loss of consciousness and headaches.  Endo/Heme/Allergies: Negative.   Psychiatric/Behavioral: Negative.       Past Medical History:  Diagnosis Date   Dyspnea    occasional   E-coli UTI 08/2019   Hepatitis 05/2020   Hepatitis B core antibody positive   Hypertension    Migraine    Plantar fibromatosis    PONV (postoperative nausea and vomiting)    Pre-diabetes    Sarcoidosis    TB lung, latent    Thyroid  nodule 01/29/2023   seen on PET scan   Vitamin B12 deficiency 06/2019   Vitamin D  deficiency 06/2019    Past Surgical History:  Procedure Laterality Date   ABDOMINAL HYSTERECTOMY     FINE NEEDLE ASPIRATION  02/19/2023   Procedure: FINE NEEDLE ASPIRATION (FNA) LINEAR;  Surgeon: Prudy Brownie, DO;  Location: [facility];  Service: Cardiopulmonary;;   FOOT SURGERY Bilateral 1985   MASS EXCISION Right 05/19/2018   Procedure: EXCISION PALMAR NODULES RIGHT HAND;  Surgeon: Brunilda Capra, MD;  Location: [facility];  Service: Orthopedics;  Laterality: Right;   PARTIAL HYSTERECTOMY  1997   VIDEO BRONCHOSCOPY WITH ENDOBRONCHIAL ULTRASOUND Bilateral 02/19/2023   Procedure: VIDEO BRONCHOSCOPY WITH ENDOBRONCHIAL ULTRASOUND;  Surgeon: Prudy Brownie, DO;  Location: [facility];  Service: Cardiopulmonary;  Laterality: Bilateral;    Family History  Problem Relation Age of Onset   Heart failure Mother    Diabetes Mother    Heart failure Father    Hypertension Father  Hyperlipidemia Father    Colon cancer Neg Hx    Colon polyps Neg Hx    Breast cancer Neg Hx    Crohn's disease Neg Hx    Esophageal cancer Neg Hx    Rectal cancer Neg Hx    Stomach cancer Neg Hx    Ulcerative colitis Neg Hx     Social History: Married.  reports that she has never smoked. She has never used smokeless tobacco. She reports that she does not drink alcohol and does not use drugs.   Allergies  Allergen Reactions    Amoxil  [Amoxicillin ] Shortness Of Breath and Palpitations   Morphine  And Codeine Hives and Rash   Ultram  [Tramadol ] Nausea And Vomiting    Medications Prior to Admission  Medication Sig Dispense Refill   fluticasone  furoate-vilanterol (BREO ELLIPTA ) 100-25 MCG/ACT AEPB Inhale 1 puff into the lungs daily. 30 each 5   hydrochlorothiazide  (HYDRODIURIL ) 25 MG tablet Take by mouth.     ibuprofen  (ADVIL ) 800 MG tablet Take 1 tablet (800 mg total) by mouth 3 (three) times daily. (Patient taking differently: Take 800 mg by mouth 3 (three) times daily as needed (back pain).) 21 tablet 0   isoniazid  (NYDRAZID ) 300 MG tablet Take 1 tablet (300 mg total) by mouth daily. (Patient taking differently: Take 300 mg by mouth daily as needed ("lung flares").) 30 tablet 8     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, Sports coach  Lives With: Spouse   Functional History: Prior Function Prior Level of Function : Independent/Modified Independent, Driving, Working/employed, History of Falls (last six months) Mobility Comments: ind ADLs Comments: ind  Functional Status:  Mobility: Bed Mobility Overal bed mobility: Needs Assistance Bed Mobility: Supine to Sit Supine to sit: Contact guard General bed mobility comments: received in recliner Transfers Overall transfer level: Needs assistance Equipment used: Rolling walker (2 wheels) Transfers: Sit  to/from Stand Sit to Stand: Min assist Bed to/from chair/wheelchair/BSC transfer type:: Step pivot Step pivot transfers: Min assist General transfer comment: min Cues for hand placement, able to complete min A solely for safety, increased time required as pt is slow to rise Ambulation/Gait Ambulation/Gait assistance: Min assist, +2 safety/equipment Gait Distance (Feet): 60 Feet (45 feet then 60 feet) Assistive device: Rolling walker (2 wheels) Gait Pattern/deviations: Step-to pattern, Ataxic, Decreased step length - left, Decreased stance time - left General Gait Details: Very slow gait with poor floor clearance, step to gait pattern with decreased step length and stance time on the L with intermittent ataxic movements of the LLE with poor progression anteriorly. Needed assist to move left LE at times and also needs constant cues for sequencing steps. Pt with tremor at times in left LE and UE which worsened the longer she ambulated. Pt has to have sitting rest break when she feels fatigued. Cues for longer step length on LLE. Gait velocity: decreased Gait velocity interpretation: <1.31 ft/sec, indicative of household ambulator    ADL: ADL Overall ADL's : Needs assistance/impaired Eating/Feeding: Sitting, Minimal assistance Eating/Feeding Details (indicate cue type and reason): min A, unable to  hold utensils with dominant LUE. LUE ataxic, has been using RUE to eat- ordering simple hand held meals. Grooming: Wash/dry hands, Wash/dry face, Oral care, Sitting, Standing, Minimal assistance Grooming Details (indicate cue type and reason): pt. able to stand for washing hands, guiding L hand and allowing it to reduce to minimal ataxic movements then guiding R hand to help wash it, pt. able to also take L hand and guide it to wash R also.  washed face primarily with R hand.  for oral care pt. performed in sitting due to fatigue from standing.  utilized basin, and cup for rinsing. Upper Body Bathing:  Sitting, Minimal assistance Lower Body Bathing: Moderate assistance, Sitting/lateral leans Upper Body Dressing : Moderate assistance, Sitting Lower Body Dressing: Contact guard assist, Bed level Lower Body Dressing Details (indicate cue type and reason): able to don L sock Toilet Transfer: Minimal assistance, Rolling walker (2 wheels), Ambulation, Cueing for sequencing, Cueing for safety Toilet Transfer Details (indicate cue type and reason): bed to chair Toileting- Clothing Manipulation and Hygiene: Moderate assistance, Sit to/from stand Functional mobility during ADLs: Minimal assistance, Rolling walker (2 wheels) General ADL Comments: Patient making steady progress towards goals. Patient session focus on cognition and further assessment for functional independence.Patient completing "Mixed Up Grocery List" activity, and able to sort through word bank, complete process of elimination and fix one error without OT assist. Of note, patient is using R hand to write but is typically L handed. OT noting minimal to no tremors when patient was completing activity using L index as a pointer in the wordbank, but tremulous with attempting to write her name. Strategies provided to promote [facility] throughout the day.  Patient continues to be an excellent candidate for intensive rehab >3 hours. OT will continue to follow.  Cognition: Cognition Overall Cognitive Status: Impaired/Different from baseline Arousal/Alertness: Awake/alert Orientation Level: Oriented X4 Attention: Sustained Sustained Attention: Impaired Sustained Attention Impairment: Verbal complex Memory: Appears intact Awareness: Appears intact Problem Solving: Impaired Problem Solving Impairment: Verbal complex Safety/Judgment: Appears intact Cognition Arousal: Alert Behavior During Therapy: WFL for tasks assessed/performed Overall Cognitive Status: Impaired/Different from baseline   Blood pressure (!) 146/66, pulse 91, temperature 98.7 F  (37.1 C), temperature source Oral, resp. rate 17, height 5\' 4"  (1.626 m), weight 99.6 kg, SpO2 97%.   Physical Exam Vitals reviewed.  Constitutional:      General: She is not in acute distress.    Appearance: Normal appearance. She is obese. She is not ill-appearing.  Cardiovascular:     Rate and Rhythm: Normal rate and regular rhythm.     Pulses: Normal pulses.     Heart sounds: Normal heart sounds. No murmur heard. Pulmonary:     Effort: Pulmonary effort is normal.     Breath sounds: Normal breath sounds.  Abdominal:     General: Abdomen is protuberant. Bowel sounds are decreased. There is no distension.     Palpations: Abdomen is soft.     Tenderness: There is no abdominal tenderness.     Comments: Abdominal bloating  Skin:    General: Skin is warm and dry.     Capillary Refill: Capillary refill takes less than 2 seconds.  Neurological:     Mental Status: She is alert and oriented to person, place, and time.     Motor: Weakness present.     Comments: LUE/LLE   Psychiatric:        Mood and Affect: Mood normal.        Behavior: Behavior  normal.        Thought Content: Thought content normal.        Judgment: Judgment normal.     No results found for this or any previous visit (from the past 48 hours). No results found.    Blood pressure (!) 146/66, pulse 91, temperature 98.7 F (37.1 C), temperature source Oral, resp. rate 17, height 5\' 4"  (1.626 m), weight 99.6 kg, SpO2 97%.  Medical Problem List and Plan: 1. Functional deficits secondary to ***  -patient may *** shower  -ELOS/Goals: *** 2.  Antithrombotics: -DVT/anticoagulation:  Pharmaceutical: Lovenox  -antiplatelet therapy: asa 3. Pain Management: tylenol  prn 4. Mood/Behavior/Sleep: LCSW to follow  -antipsychotic agents:  5. Neuropsych/cognition: This patient is capable of making decisions on her own behalf. 6. Skin/Wound Care: Routine pressure relief measures.  7. Fluids/Electrolytes/Nutrition: Monitor  I/O. Check CMET in am 8. HTN: Monitor BP TID--continue amlodipine  and HCTX 9. Sarcoidosis/Dyspnea: On breo with improvement in cough. Follwed by Dr. Alver Jobs. 10. Latent TB: Continue INH and pyridoxine  daily. Educate on importance of compliance. 11. Chronic Lumbar facet syndrome/Lumbar radiculopathy: Managed with local measures and s/p median branch block 07/2023 per [facility]    --Voltaren  gel added.  12. Hypokalemia: K-3.1 at admission. Is on hydrochlorothiazide .  --Will add  daily supplement for now.  13. Obesity: BMI 37.7. Educated on importance of weight loss and mobility to help promote overall health.     ***  Zelda Hickman, PA-C 01/28/2024

## 2024-01-29 DIAGNOSIS — I674 Hypertensive encephalopathy: Secondary | ICD-10-CM | POA: Diagnosis not present

## 2024-01-29 LAB — CBC WITH DIFFERENTIAL/PLATELET
Abs Immature Granulocytes: 0.02 10*3/uL (ref 0.00–0.07)
Basophils Absolute: 0.1 10*3/uL (ref 0.0–0.1)
Basophils Relative: 1 %
Eosinophils Absolute: 0.3 10*3/uL (ref 0.0–0.5)
Eosinophils Relative: 5 %
HCT: 38.4 % (ref 36.0–46.0)
Hemoglobin: 11.8 g/dL — ABNORMAL LOW (ref 12.0–15.0)
Immature Granulocytes: 0 %
Lymphocytes Relative: 23 %
Lymphs Abs: 1.3 10*3/uL (ref 0.7–4.0)
MCH: 22.4 pg — ABNORMAL LOW (ref 26.0–34.0)
MCHC: 30.7 g/dL (ref 30.0–36.0)
MCV: 73 fL — ABNORMAL LOW (ref 80.0–100.0)
Monocytes Absolute: 1 10*3/uL (ref 0.1–1.0)
Monocytes Relative: 18 %
Neutro Abs: 3.1 10*3/uL (ref 1.7–7.7)
Neutrophils Relative %: 53 %
Platelets: 394 10*3/uL (ref 150–400)
RBC: 5.26 MIL/uL — ABNORMAL HIGH (ref 3.87–5.11)
RDW: 14.7 % (ref 11.5–15.5)
WBC: 5.8 10*3/uL (ref 4.0–10.5)
nRBC: 0 % (ref 0.0–0.2)

## 2024-01-29 LAB — COMPREHENSIVE METABOLIC PANEL WITH GFR
ALT: 19 U/L (ref 0–44)
AST: 44 U/L — ABNORMAL HIGH (ref 15–41)
Albumin: 3.3 g/dL — ABNORMAL LOW (ref 3.5–5.0)
Alkaline Phosphatase: 153 U/L — ABNORMAL HIGH (ref 38–126)
Anion gap: 7 (ref 5–15)
BUN: 22 mg/dL — ABNORMAL HIGH (ref 6–20)
CO2: 26 mmol/L (ref 22–32)
Calcium: 9 mg/dL (ref 8.9–10.3)
Chloride: 101 mmol/L (ref 98–111)
Creatinine, Ser: 0.86 mg/dL (ref 0.44–1.00)
GFR, Estimated: >60 mL/min (ref >60)
Glucose, Bld: 106 mg/dL — ABNORMAL HIGH (ref 70–99)
Potassium: 4.2 mmol/L (ref 3.5–5.1)
Sodium: 134 mmol/L — ABNORMAL LOW (ref 135–145)
Total Bilirubin: 0.6 mg/dL (ref 0.0–1.2)
Total Protein: 7.7 g/dL (ref 6.5–8.1)

## 2024-01-29 NOTE — Evaluation (Signed)
 Physical Therapy Assessment and Plan  Patient Details  Name: Jackie Reed MRN: [id number] Date of Birth: 02/28/1966  PT Diagnosis: Abnormality of gait, Ataxic gait, Coordination disorder, Difficulty walking, and Muscle weakness Rehab Potential: Excellent ELOS: 10-12 days   Today's Date: 01/29/2024 PT Individual Time: 1300-1415 PT Individual Time Calculation (min): 75 min    Hospital Problem: Principal Problem:   Encephalopathy Active Problems:   Encephalopathy, hypertensive   Past Medical History:  Past Medical History:  Diagnosis Date   Dyspnea    occasional   E-coli UTI 08/2019   Hepatitis 05/2020   Hepatitis B core antibody positive   Hypertension    Migraine    Plantar fibromatosis    PONV (postoperative nausea and vomiting)    Pre-diabetes    Sarcoidosis    TB lung, latent    Thyroid  nodule 01/29/2023   seen on PET scan   Vitamin B12 deficiency 06/2019   Vitamin D  deficiency 06/2019   Past Surgical History:  Past Surgical History:  Procedure Laterality Date   ABDOMINAL HYSTERECTOMY     FINE NEEDLE ASPIRATION  02/19/2023   Procedure: FINE NEEDLE ASPIRATION (FNA) LINEAR;  Surgeon: Prudy Brownie, DO;  Location: [facility];  Service: Cardiopulmonary;;   FOOT SURGERY Bilateral 1985   MASS EXCISION Right 05/19/2018   Procedure: EXCISION PALMAR NODULES RIGHT HAND;  Surgeon: Brunilda Capra, MD;  Location:  [facility];  Service: Orthopedics;  Laterality: Right;   PARTIAL HYSTERECTOMY  1997   VIDEO BRONCHOSCOPY WITH ENDOBRONCHIAL ULTRASOUND Bilateral 02/19/2023   Procedure: VIDEO BRONCHOSCOPY WITH ENDOBRONCHIAL ULTRASOUND;  Surgeon: Prudy Brownie, DO;  Location: [facility];  Service: Cardiopulmonary;  Laterality: Bilateral;    Assessment & Plan Clinical Impression: Jackie Reed is a 58 year old LH-female with history of HTN, Sarcoidosis, hepatitis, migraines, LBP, Latent TB (positive QuantiFERON test) who was admitted on 01/19/24 with sudden onset  of LUE/LLE weakness and HA. CT head negative BP and patient reported elevated  SBP @ 220 at home.  UDS negative. CT head negative. CTA negative for LVO. She received TNKase  and elevated BP treated with labetalol  and hydralazine .  MRI brain negative but she continued to be limited by mild left sided weakness.  EEG negative for seizures. 2 D echo showed EF 60-65% with no wall abnormality and AV sclerosis. Dr. Christiane Cowing questioned HTN encephalopathy v/s conversion d/o due to significant LUE and LLE giveaway weakness and positive Hoover signs. Now on low dose ASA and to continue at discharge. Crestor  added for LDL 84 w/goal <70.   Therapy has been working with patient who has mild cognitive deficits with SLUMS score 21/30 requires min to mod assist overall,has tremors with increase in activity and intermittent ataxic movements. CIR recommended due to functional decline. 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 Patient transferred to CIR on 01/28/2024 .   Patient currently requires min with mobility secondary to muscle weakness, impaired timing and sequencing, ataxia, and decreased coordination, and decreased standing balance, hemiplegia, and decreased balance strategies.  Prior to hospitalization, patient was independent  with mobility and lived with Spouse in a House home.  Home access is 2-higher stepsStairs to enter.  Patient will benefit from skilled PT intervention to maximize safe functional mobility, minimize fall risk, and decrease caregiver burden for planned discharge home with 24 hour supervision.  Anticipate patient will benefit from follow up OP at discharge.  PT - End of Session Activity Tolerance: Tolerates 30+  min activity without fatigue Endurance Deficit: Yes PT Assessment Rehab Potential (ACUTE/IP ONLY): Excellent PT Barriers to Discharge: Inaccessible home environment;Home environment access/layout;Decreased caregiver support PT Patient demonstrates  impairments in the following area(s): Balance;Safety;Endurance;Motor;Sensory PT Transfers Functional Problem(s): Bed Mobility;Bed to Chair;Car PT Locomotion Functional Problem(s): Ambulation;Wheelchair Mobility;Stairs PT Plan PT Intensity: Minimum of 1-2 x/day ,45 to 90 minutes PT Frequency: 5 out of 7 days PT Duration Estimated Length of Stay: 10-12 days PT Treatment/Interventions: Ambulation/gait training;Discharge planning;DME/adaptive equipment instruction;Functional mobility training;Pain management;Psychosocial support;Splinting/orthotics;Therapeutic Activities;UE/LE Strength taining/ROM;Wheelchair propulsion/positioning;UE/LE Coordination activities;Therapeutic Exercise;Stair training;Patient/family education;Neuromuscular re-education;Functional electrical stimulation;Disease management/prevention;Community reintegration;Balance/vestibular training PT Transfers Anticipated Outcome(s): supervision with LRAD PT Locomotion Anticipated Outcome(s): supervision short distance gait PT Recommendation Recommendations for Other Services: None Follow Up Recommendations: Outpatient PT Patient destination: Home Equipment Recommended: To be determined   PT Evaluation Precautions/Restrictions Precautions Precautions: Fall Restrictions Weight Bearing Restrictions Per Provider Order: No General   Vital SignsTherapy Vitals Temp: 98.6 F (37 C) Temp Source: Oral Pulse Rate: 90 Resp: 17 BP: 131/83 Patient Position (if appropriate): Sitting Oxygen Therapy SpO2: 98 % O2 Device: Room Air Pain   Pain Interference Pain Interference Pain Effect on Sleep: 0. Does not apply - I have not had any pain or hurting in the past 5 days Pain Interference with Therapy Activities: 0. Does not apply - I have not received rehabilitationtherapy in the past 5 days Pain Interference with Day-to-Day Activities: 1. 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, Sports coach  Lives With: Spouse Prior Function Level of Independence: Independent with basic ADLs;Independent with homemaking with ambulation;Independent with gait;Independent with transfers Vision/Perception  Vision - History Ability to See in Adequate Light: 0 Adequate Perception Perception: Within Functional Limits Praxis Praxis: WFL  Cognition Overall Cognitive Status: Impaired/Different from baseline Arousal/Alertness: Awake/alert Orientation Level: Oriented X4 Day of Week: Correct Memory: Appears intact Awareness: Appears intact Problem Solving: Appears intact Sensation Sensation Light Touch: Appears Intact Hot/Cold: Appears Intact Proprioception: Appears Intact Stereognosis: Appears Intact Coordination Gross Motor Movements are Fluid and Coordinated: No Fine Motor Movements are Fluid and Coordinated: No Coordination and Movement Description: Intention tremors present/ ataxic Motor  Motor Motor: Hemiplegia;Ataxia   Trunk/Postural Assessment  Cervical Assessment Cervical Assessment: Within Functional Limits Thoracic Assessment Thoracic Assessment: Within Functional Limits Lumbar Assessment Lumbar Assessment: Within Functional Limits Postural Control Postural Control: Within Functional Limits  Balance Balance Balance Assessed: Yes Static Sitting Balance Static Sitting - Balance Support: Feet supported Static Sitting - Level of Assistance: 6: Modified independent (Device/Increase time) Dynamic Sitting Balance Dynamic Sitting - Balance Support: Feet  supported Dynamic Sitting - Level of Assistance: 5: Stand by assistance Sitting balance - Comments: SPV with dynamic seated activities Static Standing Balance Static Standing - Balance Support: Bilateral upper extremity supported Static Standing - Level of Assistance: 5: Stand by assistance Dynamic Standing Balance Dynamic Standing - Balance Support: During functional activity;Bilateral upper extremity supported Dynamic Standing - Level of Assistance: 4: Min assist Extremity Assessment  RUE Assessment RUE Assessment: Within Functional Limits General Strength Comments: 5/5 grossly LUE Assessment Passive Range of Motion (PROM) Comments: Full ROM Active Range of Motion (AROM) Comments: Full  ROM with impaired movement quality General Strength Comments: -4/5 grossly RLE Assessment RLE Assessment: Within Functional Limits LLE Assessment LLE Assessment: Exceptions to [facility] General Strength Comments: limited by ataxia, grossly 3/5 with increased time  Care Tool Care Tool Bed Mobility Roll left and right activity   Roll left and right assist level: Supervision/Verbal cueing    Sit to lying activity   Sit to lying assist level: Minimal Assistance - Patient > 75%    Lying to sitting on side of bed activity   Lying to sitting on side of bed assist level: the ability to move from lying on the back to sitting on the side of the bed with no back support.: Minimal Assistance - Patient > 75%     Care Tool Transfers Sit to stand transfer   Sit to stand assist level: Minimal Assistance - Patient > 75%    Chair/bed transfer   Chair/bed transfer assist level: Minimal Assistance - Patient > 75%    Car transfer   Car transfer assist level: Minimal Assistance - Patient > 75%      Care Tool Locomotion Ambulation   Assist level: Minimal Assistance - Patient > 75% Assistive device: Walker-rolling Max distance: 30 ft  Walk 10 feet activity   Assist level: Minimal Assistance - Patient >  75% Assistive device: Walker-rolling   Walk 50 feet with 2 turns activity Walk 50 feet with 2 turns activity did not occur: Safety/medical concerns      Walk 150 feet activity Walk 150 feet activity did not occur: Safety/medical concerns      Walk 10 feet on uneven surfaces activity Walk 10 feet on uneven surfaces activity did not occur: Safety/medical concerns      Stairs   Assist level: Minimal Assistance - Patient > 75% Stairs assistive device: 2 hand rails Max number of stairs: 4  Walk up/down 1 step activity   Walk up/down 1 step (curb) assist level: Minimal Assistance - Patient > 75% Walk up/down 1 step or curb assistive device: 2 hand rails  Walk up/down 4 steps activity   Walk up/down 4 steps assist level: Minimal Assistance - Patient > 75% Walk up/down 4 steps assistive device: 2 hand rails  Walk up/down 12 steps activity Walk up/down 12 steps activity did not occur: Safety/medical concerns      Pick up small objects from floor Pick up small object from the floor (from standing position) activity did not occur: Safety/medical concerns      Wheelchair Is the patient using a wheelchair?: Yes Type of Wheelchair: Manual   Wheelchair assist level: Dependent - Patient 0%    Wheel 50 feet with 2 turns activity   Assist Level: Dependent - Patient 0%  Wheel 150 feet activity   Assist Level: Dependent - Patient 0%    Refer to Care Plan for Long Term Goals  SHORT TERM GOAL WEEK 1 PT Short Term Goal 1 (Week 1): Pt will ambulate with foot clearance for short distances PT Short Term Goal 2 (Week 1): pt will navigate 6" stairs with 1 rail and assist PT Short Term Goal 3 (Week 1): Pt will perform STS with supervision  Recommendations for other services: None   Skilled Therapeutic Intervention Evaluation completed (see details above) with patient education regarding purpose of PT evaluation, PT POC and goals, therapy schedule, weekly team meetings, and other CIR information  including safety plan and fall risk safety. No complaint of pain. Pt is most limited by LLE ataxia with  gait resulting in pulling foot flat on floor. Pt benefited from ace wrap DF assist and .75 lb ankle weight with most improvement in foot clearance after removal.  Pt performed the below functional mobility tasks with the specified levels of skilled cuing and assistance. Pt returned to room and to recliner after session, was left with all needs in reach and alarm active.   Mobility Transfers Transfers: Sit to [employer] Sit to Stand: Minimal Assistance - Patient > 75% Transfer (Assistive device): Rolling walker Locomotion  Gait Ambulation: Yes Gait Assistance: Minimal Assistance - Patient > 75% Gait Distance (Feet): 30 Feet Assistive device: Rolling walker Gait Assistance Details: Verbal cues for precautions/safety;Verbal cues for safe use of DME/AE;Verbal cues for technique;Verbal cues for gait pattern;Manual facilitation for weight shifting;Tactile cues for initiation;Tactile cues for sequencing Gait Gait: Yes Gait Pattern: Impaired Gait Pattern: Step-through pattern;Decreased stance time - left;Left foot flat;Ataxic;Decreased hip/knee flexion - left Gait velocity: decreased Stairs / Additional Locomotion Stairs: Yes Stairs Assistance: Minimal Assistance - Patient > 75% Stair Management Technique: Two rails Number of Stairs: 4 Height of Stairs: 6 Wheelchair Mobility Wheelchair Mobility: No   Discharge Criteria: Patient will be discharged from PT if patient refuses treatment 3 consecutive times without medical reason, if treatment goals not met, if there is a change in medical status, if patient makes no progress towards goals or if patient is discharged from hospital.  The above assessment, treatment plan, treatment alternatives and goals were discussed and mutually agreed upon: by patient  Tex Filbert 01/29/2024, 3:57 PM

## 2024-01-29 NOTE — Evaluation (Signed)
 Occupational Therapy Assessment and Plan  Patient Details  Name: Jackie Reed MRN: [id number] Date of Birth: April 03, 1966  OT Diagnosis: ataxia, hemiplegia affecting dominant side, and muscle weakness (generalized) Rehab Potential: Rehab Potential (ACUTE ONLY): Good ELOS: 7-10 days   Today's Date: 01/29/2024 OT Individual Time: 4098- 0914; 1191-4782 OT Individual Time Calculation (min): 132 min     Hospital Problem: Principal Problem:   Encephalopathy Active Problems:   Encephalopathy, hypertensive   Past Medical History:  Past Medical History:  Diagnosis Date   Dyspnea    occasional   E-coli UTI 08/2019   Hepatitis 05/2020   Hepatitis B core antibody positive   Hypertension    Migraine    Plantar fibromatosis    PONV (postoperative nausea and vomiting)    Pre-diabetes    Sarcoidosis    TB lung, latent    Thyroid  nodule 01/29/2023   seen on PET scan   Vitamin B12 deficiency 06/2019   Vitamin D  deficiency 06/2019   Past Surgical History:  Past Surgical History:  Procedure Laterality Date   ABDOMINAL HYSTERECTOMY     FINE NEEDLE ASPIRATION  02/19/2023   Procedure: FINE NEEDLE ASPIRATION (FNA) LINEAR;  Surgeon: Prudy Brownie, DO;  Location: [facility];  Service: Cardiopulmonary;;   FOOT SURGERY Bilateral 1985   MASS EXCISION Right 05/19/2018   Procedure: EXCISION PALMAR NODULES RIGHT HAND;  Surgeon: Brunilda Capra, MD;  Location: [facility];  Service: Orthopedics;  Laterality: Right;   PARTIAL HYSTERECTOMY  1997   VIDEO BRONCHOSCOPY WITH ENDOBRONCHIAL ULTRASOUND Bilateral 02/19/2023   Procedure: VIDEO BRONCHOSCOPY WITH ENDOBRONCHIAL ULTRASOUND;  Surgeon: Prudy Brownie, DO;  Location: [facility];  Service: Cardiopulmonary;  Laterality: Bilateral;    Assessment & Plan Clinical Impression: Patient is a 58 y.o. year old female with recent admission to the hospital on .  Patient transferred to CIR on 01/28/2024 .    Patient currently requires min to mod  with basic self-care skills secondary to muscle weakness and impaired timing and sequencing, unbalanced muscle activation, and ataxia.  Prior to hospitalization, patient could complete IADL's with no assistance.  Patient will benefit from skilled intervention to increase independence with basic self-care skills prior to discharge home with care partner.  Anticipate patient will require 24 hour supervision and follow up home health.  OT - End of Session Activity Tolerance: Tolerates 30+ min activity with multiple rests Endurance Deficit: Yes OT Assessment Rehab Potential (ACUTE ONLY): Good OT Patient demonstrates impairments in the following area(s): Balance;Safety;Endurance;Motor OT Basic ADL's Functional Problem(s): Eating;Grooming;Bathing;Dressing;Toileting OT Advanced ADL's Functional Problem(s): Simple Meal Preparation;Light Housekeeping OT Transfers Functional Problem(s): Toilet;Tub/Shower OT Additional Impairment(s): Fuctional Use of Upper Extremity OT Plan OT Intensity: Minimum of 1-2 x/day, 45 to 90 minutes OT Frequency: 5 out of 7 days OT Duration/Estimated Length of Stay: 7-10 days OT Treatment/Interventions: Balance/vestibular training;Discharge planning;Functional electrical stimulation;Self Care/advanced ADL retraining;Therapeutic Activities;Functional mobility training;Patient/family education;Therapeutic Exercise;Community reintegration;DME/adaptive equipment instruction;Neuromuscular re-education;UE/LE Strength taining/ROM;UE/LE Coordination activities OT Self Feeding Anticipated Outcome(s): Modified independent OT Basic Self-Care Anticipated Outcome(s): Supervision OT Toileting Anticipated Outcome(s): Modified Independent OT Bathroom Transfers Anticipated Outcome(s): Supervision OT Recommendation Patient destination: Home Follow Up Recommendations: Home health OT;Outpatient OT Equipment Recommended: 3 in 1 bedside comode;Tub/shower seat;To be determined   OT  Evaluation Precautions/Restrictions  Precautions Precautions: Fall Recall of Precautions/Restrictions: Intact Restrictions Weight Bearing Restrictions Per Provider Order: No General Chart Reviewed: Yes Family/Caregiver Present: No Vital Signs   Pain Pain Assessment Pain Scale: 0-10 Pain Score: 0-No pain Faces Pain  Scale: No hurt PAINAD (Pain Assessment in Advanced Dementia) Breathing: normal Negative Vocalization: none Home Living/Prior Functioning Home Living Family/patient expects to be discharged to:: Private residence Living Arrangements: Spouse/significant other Available Help at Discharge: Family, Available 24 hours/day Type of Home: House Home Access: Stairs to enter [employer] of Steps: 2-higher steps Entrance Stairs-Rails: None Home Layout: Multi-level Alternate Level Stairs-Number of Steps: 5 stairs down to den; 6 stairs up to bedroom Bathroom Shower/Tub: Associate Professor: Yes  Lives With: Spouse IADL History Homemaking Responsibilities: Yes Meal Prep Responsibility: Secondary Homemaking Comments: Shares chores with husband Current License: Yes Mode of Transportation: Set designer Occupation: Full time employment 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 Vision- wears glasses for distance. No change from baseline Cognition- no functional change   Sensation Sensation Light Touch: Appears Intact Hot/Cold: Appears Intact Proprioception: Appears Intact Stereognosis: Appears Intact Coordination Gross Motor Movements are Fluid and Coordinated: No Fine Motor Movements are Fluid and Coordinated: No Coordination and Movement  Description: Intention tremors present/ ataxic Motor  Motor Motor: Hemiplegia;Ataxia  Trunk/Postural Assessment  Cervical Assessment Cervical Assessment: Within Functional Limits Thoracic Assessment Thoracic Assessment: Within Functional Limits Postural Control Postural Control: Within Functional Limits  Balance Dynamic Sitting Balance Sitting balance - Comments: SPV with dynamic seated activities Extremity/Trunk Assessment RUE Assessment RUE Assessment: Within Functional Limits General Strength Comments: 5/5 grossly LUE Assessment Passive Range of Motion (PROM) Comments: Full ROM Active Range of Motion (AROM) Comments: Full ROM with impaired movement quality General Strength Comments: -4/5 grossly  Care Tool Care Tool Self Care Eating   Eating Assist Level: Set up assist    Oral Care    Oral Care Assist Level: Set up assist    Bathing   Body parts bathed by patient: Left arm;Chest;Abdomen;Front perineal area;Right upper leg;Left upper leg;Face Body parts bathed by helper: Buttocks;Right arm;Right lower leg;Left lower leg   Assist Level: Moderate Assistance - Patient 50 - 74%    Upper Body Dressing(including orthotics)   What is the patient wearing?: Bra;Pull over shirt   Assist Level: Minimal Assistance - Patient > 75%    Lower Body Dressing (excluding footwear)   What is the patient wearing?: Pants;Underwear/pull up Assist for lower body dressing: Moderate Assistance - Patient 50 - 74%    Putting on/Taking off footwear   What is the patient wearing?: Non-skid slipper socks Assist for footwear: Dependent - Patient 0%       Care Tool Toileting Toileting activity   Assist for toileting: Moderate Assistance - Patient 50 - 74%     Care Tool Bed Mobility Roll left and right activity   Roll left and right assist level: Supervision/Verbal cueing    Sit to lying activity   Sit to lying assist level: Minimal Assistance - Patient > 75%    Lying to sitting on side  of bed activity   Lying to sitting on side of bed assist level: the ability to move from lying on the back to sitting on the side of the bed with no back support.: Minimal Assistance - Patient > 75%     Care Tool Transfers Sit to stand transfer   Sit to stand assist level: Minimal Assistance - Patient > 75%    Chair/bed transfer   Chair/bed  transfer assist level: Minimal Assistance - Patient > 75%     Toilet transfer   Assist Level: Minimal Assistance - Patient > 75%     Care Tool Cognition  Expression of Ideas and Wants Expression of Ideas and Wants: 4. Without difficulty (complex and basic) - expresses complex messages without difficulty and with speech that is clear and easy to understand  Understanding Verbal and Non-Verbal Content Understanding Verbal and Non-Verbal Content: 4. Understands (complex and basic) - clear comprehension without cues or repetitions   Memory/Recall Ability Memory/Recall Ability : Current season;That he or she is in a hospital/hospital unit;Location of own room   Refer to Care Plan for Long Term Goals  SHORT TERM GOAL WEEK 1 OT Short Term Goal 1 (Week 1): See LTG's due to ELOS  Recommendations for other services: None    Skilled Therapeutic Intervention ADL ADL Eating: (P) Set up Where Assessed-Eating: (P) Bed level Grooming: (P) Setup Where Assessed-Grooming: (P) Wheelchair;Sitting at sink Upper Body Bathing: (P) Contact guard Where Assessed-Upper Body Bathing: (P) Shower Lower Body Bathing: (P) Moderate assistance Where Assessed-Lower Body Bathing: (P) Shower Upper Body Dressing: (P) Minimal assistance Where Assessed-Upper Body Dressing: (P) Wheelchair Lower Body Dressing: (P) Moderate assistance Where Assessed-Lower Body Dressing: (P) Wheelchair Toileting: (P) Minimal assistance Mobility  Transfers Sit to Stand: Minimal Assistance - Patient > 75%  Second Session: Patient seen for FM analysis, training on HEP working with yellow  theraPutty, and LUE strength and coordination tasks. Patient agreeable  and motivated to participate with second 60 minute treatment session. Patient assisted from recliner to w/c with min assist and taken to therapy gym for LUE NMRE session. Patient with good attention throughout sensory assessment. No deficits noted. Continued with FM manipulatives working on maintaining 3 point pinch and motor control for targeting accuracy tasks. Patient limited by tremor but was successful with grasp release and FM pinch despite tremor. Continue with skilled OT treatment to improve strength and regain LUE function.   Discharge Criteria: Patient will be discharged from OT if patient refuses treatment 3 consecutive times without medical reason, if treatment goals not met, if there is a change in medical status, if patient makes no progress towards goals or if patient is discharged from hospital.  The above assessment, treatment plan, treatment alternatives and goals were discussed and mutually agreed upon: by patient  Marty Sleet 01/29/2024, 12:50 PM

## 2024-01-29 NOTE — Progress Notes (Signed)
 Inpatient Rehabilitation Admission Medication Review by a Pharmacist  A complete drug regimen review was completed for this patient to identify any potential clinically significant medication issues.  High Risk Drug Classes Is patient taking? Indication by Medication  Antipsychotic No   Anticoagulant Yes Lovenox : VTE ppx  Antibiotic No   Opioid No   Antiplatelet Yes Aspirin : CVA ppx  Hypoglycemics/insulin No   Vasoactive Medication Yes Amlodipine , hydrochlorothiazide : HTN  Chemotherapy No   Other Yes Voltaren  gel, Tylenol : pain Docusate, Bisacodyl , Fleet enema: constipation Breo Ellipta : Sarcoidosis/Dyspnea  Isoniazid : latent TB KCl, vitB6: supplement Crestor : HLD/CVA ppx Mylanta: indigestion Baclofen : muscle spasms Benadryl : itching Robitussin: cough Melatonin: sleep Compazine : N/V     Type of Medication Issue Identified Description of Issue Recommendation(s)  Drug Interaction(s) (clinically significant)     Duplicate Therapy     Allergy     No Medication Administration End Date     Incorrect Dose     Additional Drug Therapy Needed     Significant med changes from prior encounter (inform family/care partners about these prior to discharge). New aspirin  and Crestor  Restart or discontinue as appropriate. Communicate medication changes with patient/family at discharge  Other       Clinically significant medication issues were identified that warrant physician communication and completion of prescribed/recommended actions by midnight of the next day:  No   Time spent performing this drug regimen review (minutes): 30   Thank you for allowing pharmacy to be a part of this patient's care.   Victoria Grass, PharmD 12/29/2023 9:13 AM   **Pharmacist phone directory can be found on [email or web address] listed under [facility]**

## 2024-01-29 NOTE — Progress Notes (Signed)
 PROGRESS NOTE   Subjective/Complaints:  Reviewed labs, pt would like IV out.  No other c/os, states LEU tremor improving  Objective:   No results found. Recent Labs    01/29/24 0455  WBC 5.8  HGB 11.8*  HCT 38.4  PLT 394   Recent Labs    01/28/24 1902 01/29/24 0455  NA 138 134*  K 3.6 4.2  CL 104 101  CO2 22 26  GLUCOSE 130* 106*  BUN 26* 22*  CREATININE 0.85 0.86  CALCIUM  10.0 9.0    Intake/Output Summary (Last 24 hours) at 01/29/2024 0756 Last data filed at 01/29/2024 0620 Gross per 24 hour  Intake 120 ml  Output 400 ml  Net -280 ml        Physical Exam: Vital Signs Blood pressure 139/85, pulse 80, temperature 98.2 F (36.8 C), temperature source Oral, resp. rate 16, height 5\' 4"  (1.626 m), SpO2 99%.   General: No acute distress Mood and affect are appropriate Heart: Regular rate and rhythm no rubs murmurs or extra sounds Lungs: Clear to auscultation, breathing unlabored, no rales or wheezes Abdomen: Positive bowel sounds, soft nontender to palpation, nondistended Extremities: No clubbing, cyanosis, or edema Skin: No evidence of breakdown, no evidence of rash Neurologic: Cranial nerves II through XII intact, motor strength is 5/5 in left deltoid, bicep, tricep, grip, hip flexor, knee extensors, ankle dorsiflexor and plantar flexor LUE resist passive movement,  Sensory exam normal sensation to light touch and proprioception in bilateral upper and lower extremities Cerebellar exam unable to eval LUE Musculoskeletal: no pain with AAROM but limited exam. No joint swelling   Assessment/Plan: 1. Functional deficits which require 3+ hours per day of interdisciplinary therapy in a comprehensive inpatient rehab setting. Physiatrist is providing close team supervision and 24 hour management of active medical problems listed below. Physiatrist and rehab team continue to assess barriers to discharge/monitor  patient progress toward functional and medical goals  Care Tool:  Bathing              Bathing assist       Upper Body Dressing/Undressing Upper body dressing        Upper body assist      Lower Body Dressing/Undressing Lower body dressing            Lower body assist       Toileting Toileting    Toileting assist       Transfers Chair/bed transfer  Transfers assist           Locomotion Ambulation   Ambulation assist              Walk 10 feet activity   Assist           Walk 50 feet activity   Assist           Walk 150 feet activity   Assist           Walk 10 feet on uneven surface  activity   Assist           Wheelchair     Assist               Wheelchair  50 feet with 2 turns activity    Assist            Wheelchair 150 feet activity     Assist          Blood pressure 139/85, pulse 80, temperature 98.2 F (36.8 C), temperature source Oral, resp. rate 16, height 5\' 4"  (1.626 m), SpO2 99%.  Medical Problem List and Plan: 1. Functional deficits secondary to left hemiparesis secondary to hypertensive encephalopathy versus conversion disorder             -patient may shower             -ELOS/Goals: 7 to 10 days, supervision PT/OT, mod I SLP             - Stable for admission to inpatient rehab   2.  Antithrombotics: -DVT/anticoagulation:  Pharmaceutical: Lovenox              -antiplatelet therapy: asa 3. Pain Management: tylenol  prn 4. Mood/Behavior/Sleep: LCSW to follow             -antipsychotic agents:  5. Neuropsych/cognition: This patient is capable of making decisions on her own behalf. 6. Skin/Wound Care: Routine pressure relief measures.  7. Fluids/Electrolytes/Nutrition: Monitor I/O. Check CMET in am 8. HTN: Monitor BP TID--continue amlodipine  and HCTX Vitals:   01/28/24 2007 01/29/24 0609  BP: 139/86 139/85  Pulse: 92 80  Resp: 17 16  Temp: 98.2 F (36.8 C)  98.2 F (36.8 C)  SpO2: 99% 99%    9. Sarcoidosis/Dyspnea: On breo with improvement in cough. Follwed by Dr. Alver Jobs. 10. Latent TB: Continue INH and pyridoxine  daily. Educate on importance of compliance. 11. Chronic Lumbar facet syndrome/Lumbar radiculopathy: Managed with local measures and s/p median branch block 07/2023 per [facility]               --Voltaren  gel added.              - Reviewed chart; similar presentation to her pain management presentations since 2020.  Has failed cyclobenzaprine  (Flexeril ), gabapentin  (Neurontin , Gralise ), Reports mild benefit with use of Diclofenac  but not has not used recently.              - Lumbar MRI 09/21/23:  1. Mild anterolisthesis of L4-L5 with possible bilateral pars defects at this level.  2. Multilevel lumbar spondylosis, most pronounced at the L4-L5 and L5-S1 levels with marked facet arthropathy. There is mild spinal canal stenosis and subarticular recess narrowing at L4-L5 with disc material likely contacting the bilateral descending L5 nerve roots as described.  3. No substantial foraminal stenosis at any of the lumbar levels.                - Add baclofen  10 mg 3 times daily as needed for muscle spasms   12. Hypokalemia: K-3.1 at admission. Is on hydrochlorothiazide .  --Will add  daily supplement for now.     Latest Ref Rng & Units 01/29/2024    4:55 AM 01/28/2024    7:02 PM 01/22/2024   12:28 PM  BMP  Glucose 70 - 99 mg/dL 161  096    BUN 6 - 20 mg/dL 22  26    Creatinine 0.45 - 1.00 mg/dL 4.09  8.11  9.14   Sodium 135 - 145 mmol/L 134  138    Potassium 3.5 - 5.1 mmol/L 4.2  3.6    Chloride 98 - 111 mmol/L 101  104    CO2 22 -  32 mmol/L 26  22    Calcium  8.9 - 10.3 mg/dL 9.0  56.2      13. Obesity: BMI 37.7. Educated on importance of weight loss and mobility to help promote overall health.         LOS: 1 days A FACE TO FACE EVALUATION WAS PERFORMED  Jackie Reed 01/29/2024, 7:56 AM

## 2024-01-29 NOTE — Plan of Care (Signed)
  Problem: RH Balance Goal: LTG Patient will maintain dynamic standing with ADLs (OT) Description: LTG:  Patient will maintain dynamic standing balance with assist during activities of daily living (OT)  Flowsheets (Taken 01/29/2024 1122) LTG: Pt will maintain dynamic standing balance during ADLs with: Independent with assistive device   Problem: RH Eating Goal: LTG Patient will perform eating w/assist, cues/equip (OT) Description: LTG: Patient will perform eating with assist, with/without cues using equipment (OT) Flowsheets (Taken 01/29/2024 1122) LTG: Pt will perform eating with assistance level of: Independent with assistive device  LTG: Pt will perform eating using equipment: Weighted utensil   Problem: RH Grooming Goal: LTG Patient will perform grooming w/assist,cues/equip (OT) Description: LTG: Patient will perform grooming with assist, with/without cues using equipment (OT) Flowsheets (Taken 01/29/2024 1122) LTG: Pt will perform grooming with assistance level of: Independent with assistive device    Problem: RH Bathing Goal: LTG Patient will bathe all body parts with assist levels (OT) Description: LTG: Patient will bathe all body parts with assist levels (OT) Flowsheets (Taken 01/29/2024 1122) LTG: Pt will perform bathing with assistance level/cueing: Supervision/Verbal cueing LTG: Position pt will perform bathing: Shower   Problem: RH Dressing Goal: LTG Patient will perform upper body dressing (OT) Description: LTG Patient will perform upper body dressing with assist, with/without cues (OT). Flowsheets (Taken 01/29/2024 1122) LTG: Pt will perform upper body dressing with assistance level of: Independent with assistive device Goal: LTG Patient will perform lower body dressing w/assist (OT) Description: LTG: Patient will perform lower body dressing with assist, with/without cues in positioning using equipment (OT) Flowsheets (Taken 01/29/2024 1122) LTG: Pt will perform lower body  dressing with assistance level of: Independent with assistive device   Problem: RH Toileting Goal: LTG Patient will perform toileting task (3/3 steps) with assistance level (OT) Description: LTG: Patient will perform toileting task (3/3 steps) with assistance level (OT)  Flowsheets (Taken 01/29/2024 1122) LTG: Pt will perform toileting task (3/3 steps) with assistance level: Independent with assistive device   Problem: RH Functional Use of Upper Extremity Goal: LTG Patient will use RT/LT upper extremity as a (OT) Description: LTG: Patient will use right/left upper extremity as a stabilizer/gross assist/diminished/nondominant/dominant level with assist, with/without cues during functional activity (OT) Flowsheets (Taken 01/29/2024 1122) LTG: Use of upper extremity in functional activities: LUE as nondominant level LTG: Pt will use upper extremity in functional activity with assistance level of: Independent with assistive device   Problem: RH Simple Meal Prep Goal: LTG Patient will perform simple meal prep w/assist (OT) Description: LTG: Patient will perform simple meal prep with assistance, with/without cues (OT). Flowsheets (Taken 01/29/2024 1122) LTG: Pt will perform simple meal prep with assistance level of: Supervision/Verbal cueing LTG: Pt will perform simple meal prep w/level of: Ambulate with device   Problem: RH Toilet Transfers Goal: LTG Patient will perform toilet transfers w/assist (OT) Description: LTG: Patient will perform toilet transfers with assist, with/without cues using equipment (OT) Flowsheets (Taken 01/29/2024 1122) LTG: Pt will perform toilet transfers with assistance level of: Supervision/Verbal cueing   Problem: RH Tub/Shower Transfers Goal: LTG Patient will perform tub/shower transfers w/assist (OT) Description: LTG: Patient will perform tub/shower transfers with assist, with/without cues using equipment (OT) Flowsheets (Taken 01/29/2024 1122) LTG: Pt will perform  tub/shower stall transfers with assistance level of: Supervision/Verbal cueing LTG: Pt will perform tub/shower transfers from: Tub/shower combination

## 2024-01-30 DIAGNOSIS — I674 Hypertensive encephalopathy: Secondary | ICD-10-CM | POA: Diagnosis not present

## 2024-01-30 NOTE — Progress Notes (Signed)
 Kirsteins, Cecilia Coe, MD  Physician Physical Medicine and Rehabilitation   Consult Note    Signed   Date of Service: 01/24/2024  3:37 PM  Related encounter: ED to Hosp-Admission (Discharged) from 01/19/2024 in [facility]   Signed     Expand All Collapse All  Show:Clear all [x] Written[x] Templated[] Copied  Added by: [x] Kirsteins, Cecilia Coe, MD[x] Masters, Alston Jerry, DO  [] Hover for details          Physical Medicine and Rehabilitation Consult Reason for Consult: evaluate for inpatient appropriateness Referring Physician: Stroke Team     HPI: Jackie Reed is a 58 y.o. female with PMH of HTN, sarcoidosis, latent TB undergoing treatment, who presented to [facility] with sudden onset left arm and leg weakness on 5/28. She was noted to be extremely hypertensive with systolics >220s and received labetolol and hydralazine  prior to administration of TNK. She was then transferred to [facility] ICU for monitoring and blood pressure control. MRI completed 5/29 which was negative for acute infarct.    She was noted to have left upper extremity tremors with concerns for focal motor seizure, however spot EEG was negative. With concern for possible cervical spine pathology contributing to weakness MRI was completed which showed minor cord flattening without cord signal changes or significant spinal stenosis.   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, Sports coach  Lives With: Spouse  Functional History: Prior Function Prior Level of Function : Independent/Modified Independent, Driving, Working/employed, History of Falls (last six months) Mobility Comments: ind ADLs Comments: ind Functional Status:  Mobility: Bed Mobility Overal bed mobility: Needs Assistance Bed Mobility: Supine to Sit Supine to sit: Min assist General bed mobility comments: Pt in recliner on arrival and departure Transfers Overall transfer level: Needs assistance Equipment used: Rolling walker (2 wheels) Transfers: Sit to/from Stand, Bed to chair/wheelchair/BSC Sit to Stand: Min assist, +2 safety/equipment Bed to/from chair/wheelchair/BSC transfer type:: Step pivot Step pivot transfers: Min assist General transfer comment: did not assess Ambulation/Gait Ambulation/Gait assistance: Min assist, +2 safety/equipment Gait Distance (Feet): 75 Feet Assistive device: Rolling walker (2 wheels) Gait Pattern/deviations: Step-to pattern, Ataxic, Decreased step length - left, Decreased stance time - left General Gait Details: Very slow gait with poor floor clearance, step to gait pattern with decreased step length and stance time on the L with intermittent ataxic movements of the LLE with poor progression anteriorly.  Pt with tremor at times in left LE and UE which worsened the longer she ambulated.  Followed pt with chair and after 75 feet, pt wanted to rest. Gait velocity: decreased Gait velocity interpretation: <1.31 ft/sec, indicative of household ambulator   ADL: ADL Overall ADL's : Needs assistance/impaired Eating/Feeding: Sitting, Minimal assistance Eating/Feeding Details (indicate cue type and reason): min A, unable to hold utensils with dominant LUE. LUE ataxic, has been using RUE to eat- ordering simple hand held meals. Grooming: Wash/dry hands, Wash/dry face, Oral care, Sitting, Standing, Minimal assistance Grooming Details (indicate cue type  and reason): pt. able to stand for washing hands, guiding L hand and allowing it to  reduce to minimal ataxic movements then guiding R hand to help wash it, pt. able to also take L hand and guide it to wash R also.  washed face primarily with R hand.  for oral care pt. performed in sitting due to fatigue from standing.  utilized basin, and cup for rinsing. Upper Body Bathing: Sitting, Minimal assistance Lower Body Bathing: Moderate assistance, Sitting/lateral leans Upper Body Dressing : Moderate assistance, Sitting Lower Body Dressing: Maximal assistance, Sitting/lateral leans Lower Body Dressing Details (indicate cue type and reason): don bilat socks, educated pt on one handed dressing techniques. LLE ataxia and weakness now unable to obtain figure four position now-started in a modified long sitting and then scooted towards eob and had LLE bent sideways at the knee and was able to hold sock with B hands and hook over great toe then guide sock on slowly with B hands Toilet Transfer: Minimal assistance, Rolling walker (2 wheels), Grab bars, BSC/3in1, Regular Toilet, Ambulation, Cueing for sequencing, Cueing for safety Toilet Transfer Details (indicate cue type and reason): ambulated from eob into b.room. cues for hand placement L hand on grab bar, R hand on armrest then able to sit slow and controlled. Toileting- Clothing Manipulation and Hygiene: Moderate assistance, Sit to/from stand Functional mobility during ADLs: Minimal assistance, Rolling walker (2 wheels) General ADL Comments: Patient making steady progress towards goals. Patient session focus on cognition and further assessment for functional independence. Patient scoring a 2/5 on Mini-Cog, indicative of cognitive impairments, and then 4/5 on Medi-Cog however needing significant time to complete instruction set #2 (upwards of 5 minutes and reading the prompt each time to fill in correctly for every day of the week). Patient receptive to a Customer service manager as well as placing an alarm on her phone to improve medication management. Patient does demonstrate awareness that she has not been taking her medications appropriately, leading to this hospital stay. Patient continues to be an excellent candidate for intensive rehab >3 hours. OT will continue to follow.   Cognition: Cognition Overall Cognitive Status: Impaired/Different from baseline Arousal/Alertness: Awake/alert Orientation Level: Oriented X4 Attention: Sustained Sustained Attention: Impaired Sustained Attention Impairment: Verbal complex Memory: Appears intact Awareness: Appears intact Problem Solving: Impaired Problem Solving Impairment: Verbal complex Safety/Judgment: Appears intact Cognition Arousal: Alert Behavior During Therapy: WFL for tasks assessed/performed Overall Cognitive Status: Impaired/Different from baseline     Review of Systems  Constitutional:  Negative for chills and fever.  HENT:  Negative for ear discharge and nosebleeds.   Eyes:  Negative for discharge and redness.  Respiratory:  Negative for cough and hemoptysis.   Cardiovascular:  Negative for chest pain and palpitations.  Gastrointestinal:  Negative for abdominal pain, heartburn, nausea and vomiting.  Genitourinary:  Negative for flank pain.  Musculoskeletal:  Negative for joint pain and neck pain.  Skin:  Negative for rash.        Past Medical History:  Diagnosis Date   Dyspnea      occasional   E-coli UTI 08/2019   Hepatitis 05/2020    Hepatitis B core antibody positive   Hypertension     Migraine     Plantar fibromatosis     PONV (postoperative nausea and vomiting)     Pre-diabetes     Thyroid  nodule 01/29/2023    seen on PET scan   Vitamin B12 deficiency 06/2019   Vitamin D  deficiency 06/2019             Past Surgical History:  Procedure Laterality Date  ABDOMINAL HYSTERECTOMY       FINE NEEDLE ASPIRATION   02/19/2023    Procedure: FINE NEEDLE ASPIRATION (FNA)  LINEAR;  Surgeon: Prudy Brownie, DO;  Location: [facility];  Service: Cardiopulmonary;;   FOOT SURGERY Bilateral 1985   MASS EXCISION Right 05/19/2018    Procedure: EXCISION PALMAR NODULES RIGHT HAND;  Surgeon: Brunilda Capra, MD;  Location: [facility];  Service: Orthopedics;  Laterality: Right;   PARTIAL HYSTERECTOMY   1997   VIDEO BRONCHOSCOPY WITH ENDOBRONCHIAL ULTRASOUND Bilateral 02/19/2023    Procedure: VIDEO BRONCHOSCOPY WITH ENDOBRONCHIAL ULTRASOUND;  Surgeon: Prudy Brownie, DO;  Location: [facility];  Service: Cardiopulmonary;  Laterality: Bilateral;             Family History  Problem Relation Age of Onset   Heart failure Mother     Diabetes Mother     Heart failure Father     Hypertension Father     Hyperlipidemia Father     Colon cancer Neg Hx     Colon polyps Neg Hx     Breast cancer Neg Hx     Crohn's disease Neg Hx     Esophageal cancer Neg Hx     Rectal cancer Neg Hx     Stomach cancer Neg Hx     Ulcerative colitis Neg Hx          Social History:  reports that she has never smoked. She has never used smokeless tobacco. She reports that she does not drink alcohol and does not use drugs. Allergies:  Allergies      Allergies  Allergen Reactions   Amoxil  [Amoxicillin ] Shortness Of Breath and Palpitations   Morphine  And Codeine Hives and Rash   Ultram  [Tramadol ] Nausea And Vomiting            Medications Prior to Admission  Medication Sig Dispense Refill   fluticasone  furoate-vilanterol (BREO ELLIPTA ) 100-25 MCG/ACT AEPB Inhale 1 puff into the lungs daily. 30 each 5   hydrochlorothiazide  (HYDRODIURIL ) 25 MG tablet Take by mouth.       ibuprofen  (ADVIL ) 800 MG tablet Take 1 tablet (800 mg total) by mouth 3 (three) times daily. (Patient taking differently: Take 800 mg by mouth 3 (three) times daily as needed (back pain).) 21 tablet 0   isoniazid  (NYDRAZID ) 300 MG tablet Take 1 tablet (300 mg total) by mouth daily. (Patient taking  differently: Take 300 mg by mouth daily as needed ("lung flares").) 30 tablet 8            Blood pressure (!) 140/89, pulse 94, temperature 97.8 F (36.6 C), temperature source Oral, resp. rate (!) 24, height 5\' 4"  (1.626 m), weight 99.6 kg, SpO2 99%. Physical Exam Constitutional:      Appearance: She is obese.  HENT:     Head: Normocephalic and atraumatic.     Mouth/Throat:     Mouth: Mucous membranes are moist.  Cardiovascular:     Rate and Rhythm: Normal rate and regular rhythm.     Heart sounds: Normal heart sounds. No murmur heard. Pulmonary:     Effort: Pulmonary effort is normal. No respiratory distress.     Breath sounds: Normal breath sounds. No stridor.  Abdominal:     General: Abdomen is flat. Bowel sounds are normal. There is no distension.     Palpations: Abdomen is soft. There is no mass.     Tenderness: There is no abdominal tenderness.  Musculoskeletal:     Right lower  leg: No edema.     Left lower leg: No edema.  Skin:    General: Skin is warm and dry.  Neurological:     Mental Status: She is alert and oriented to person, place, and time.     Comments: Coarse tremor LUE   Give away weakness left upper  and LLE At least 4- Left delt bi tri grip HF, KE and ankle DF   RIght side 5/5 strength    Sensation intact LT in BUE and BLE    Psychiatric:        Mood and Affect: Mood normal.        Behavior: Behavior normal.    Increased latency of response , also required cuing to participate in sensory exam , left vs right, big vs little toe   Lab Results Last 24 Hours  No results found for this or any previous visit (from the past 24 hours).    Imaging Results (Last 48 hours)  MR CERVICAL SPINE WO CONTRAST Result Date: 01/22/2024 CLINICAL DATA:  Initial evaluation for left upper extremity weakness. EXAM: MRI CERVICAL SPINE WITHOUT CONTRAST TECHNIQUE: Multiplanar, multisequence MR imaging of the cervical spine was performed. No intravenous contrast was  administered. COMPARISON:  CT from 10/21/2018 FINDINGS: Alignment: Straightening of the normal cervical lordosis. No listhesis. Vertebrae: Vertebral body height maintained without acute or chronic fracture. Bone marrow signal intensity within normal limits. No worrisome osseous lesions. No abnormal marrow edema. Cord: Normal signal and morphology. Posterior Fossa, vertebral arteries, paraspinal tissues: Unremarkable. Disc levels: C2-C3: Small central disc protrusion mildly indents the ventral thecal sac. No spinal stenosis. Foramina remain patent. C3-C4: Small left paracentral disc protrusion indents the ventral thecal sac. Minimal cord flattening without cord signal changes or significant spinal stenosis. Foramina remain patent. C4-C5: Small central to left paracentral disc protrusion indents the ventral thecal sac. Minimal cord flattening without cord signal changes or significant spinal stenosis. Foramina remain patent. C5-C6: Left paracentral disc protrusion indents and partially faces the ventral thecal sac. Mild flattening of the left hemi cord without cord signal changes or significant spinal stenosis. Foramina remain patent. C6-C7: Small right paracentral disc protrusion indents the right ventral thecal sac. Minimal cord flattening without cord signal changes or significant spinal stenosis. Foramina remain patent. C7-T1: Negative interspace. Mild left-sided facet hypertrophy. No canal or foraminal stenosis. IMPRESSION: 1. Small central to left paracentral disc protrusions at C2-3 through C5-6 as above. Secondary minor cord flattening without cord signal changes or significant spinal stenosis. Findings could contribute to left upper extremity symptoms. 2. Small right paracentral disc protrusion at C6-7 without significant stenosis. Electronically Signed   By: Virgia Griffins M.D.   On: 01/22/2024 18:07       Assessment/Plan: Diagnosis: Hypertensive encephalopathy Does the need for close, 24 hr/day  medical supervision in concert with the patient's rehab needs make it unreasonable for this patient to be served in a less intensive setting? Yes Co-Morbidities requiring supervision/potential complications:  -morbid obesity, hypertension uncontrolled  Due to bladder management, bowel management, safety, skin/wound care, disease management, medication administration, pain management, and patient education, does the patient require 24 hr/day rehab nursing? Yes Does the patient require coordinated care of a physician, rehab nurse, therapy disciplines of PT, OT, SLP  to address physical and functional deficits in the context of the above medical diagnosis(es)? Yes Addressing deficits in the following areas: balance, endurance, locomotion, strength, transferring, bowel/bladder control, bathing, dressing, feeding, grooming, toileting, and psychosocial support Can the patient actively participate  in an intensive therapy program of at least 3 hrs of therapy per day at least 5 days per week? Yes The potential for patient to make measurable gains while on inpatient rehab is good Anticipated functional outcomes upon discharge from inpatient rehab are modified independent and supervision  with PT, modified independent and supervision with OT, modified independent with SLP. Estimated rehab length of stay to reach the above functional goals is: 7-10d Anticipated discharge destination: Home Overall Rehab/Functional Prognosis: good   POST ACUTE RECOMMENDATIONS: This patient's condition is appropriate for continued rehabilitative care in the following setting: CIR Patient has agreed to participate in recommended program. Yes Note that insurance prior authorization may be required for reimbursement for recommended care.   Comment: has steps to get in home plus split level configuration        I have personally performed a face to face diagnostic evaluation of this patient. Additionally, I have examined the  patient's medical record including any pertinent labs and radiographic images.     Thanks,   Karalee Oscar, DO 01/24/2024 "I have personally performed a face to face diagnostic evaluation of this patient.  Additionally, I have reviewed and concur with the physician assistant's documentation above." Genetta Kenning M.D. [facility] Fellow Am [employer] of [employer] Med and Rehab Diplomate Am Board of Electrodiagnostic Med Fellow Am Board of Interventional Pain           Revision History  Routing History

## 2024-01-30 NOTE — Progress Notes (Signed)
 Physical Therapy Session Note  Patient Details  Name: Jackie Reed MRN: [id number] Date of Birth: [birth year]  Today's Date: 01/30/2024 PT Individual Time: 1417-1530 PT Individual Time Calculation (min): 73 min   Short Term Goals: Week 1:  PT Short Term Goal 1 (Week 1): Pt will ambulate with foot clearance for short distances PT Short Term Goal 2 (Week 1): pt will navigate 6" stairs with 1 rail and assist PT Short Term Goal 3 (Week 1): Pt will perform STS with supervision  Skilled Therapeutic Interventions/Progress Updates: Pt presents sitting in recliner, visiting w/ friends but agreeable to therapy.  Pt transfers sit to stand w/ cues for placement of L hand and CGA.  Pt amb x 60' w/ min/CGA and cues for slightly increased weight shift to R for LLE advancement.  Pt performs stand to sit transfers throughout session using L hand on seat/arm rest.  Toe cap donned to L shoe and performed 50' gait distance w/ improved advancement, although occasional cues still needed to weight shift R.  Pt performed 2 x 5 sit to stand transfers using B hands on mat table, and then 2 x 5 w/ R foot on 2" platform w/ min A.  Pt amb w/ cues for L heel-off to swing phase, heel strike x 20' . Pt amb into room x 12' to recliner.  Pt remained sitting w/ all needs in reach, pt ref the chair alarm, always calling for assistance.       Therapy Documentation Precautions:  Precautions Precautions: Fall Recall of Precautions/Restrictions: Intact Restrictions Weight Bearing Restrictions Per Provider Order: No General:   Vital Signs:   Pain:0/10    Therapy/Group: Individual Therapy  Jakeb Lamping P Ebonye Reade 01/30/2024, 3:54 PM

## 2024-01-30 NOTE — Progress Notes (Signed)
 PROGRESS NOTE   Subjective/Complaints: Reviewed labs Pt had shower this am , feels like LUE getting stronger , doing well per OT ROS-  Objective:   No results found. Recent Labs    01/29/24 0455  WBC 5.8  HGB 11.8*  HCT 38.4  PLT 394   Recent Labs    01/28/24 1902 01/29/24 0455  NA 138 134*  K 3.6 4.2  CL 104 101  CO2 22 26  GLUCOSE 130* 106*  BUN 26* 22*  CREATININE 0.85 0.86  CALCIUM  10.0 9.0    Intake/Output Summary (Last 24 hours) at 01/30/2024 0859 Last data filed at 01/29/2024 1851 Gross per 24 hour  Intake 357 ml  Output --  Net 357 ml        Physical Exam: Vital Signs Blood pressure 133/74, pulse 86, temperature 98.2 F (36.8 C), temperature source Oral, resp. rate 18, height 5\' 4"  (1.626 m), SpO2 98%.   General: No acute distress Mood and affect are appropriate Heart: Regular rate and rhythm no rubs murmurs or extra sounds Lungs: Clear to auscultation, breathing unlabored, no rales or wheezes Abdomen: Positive bowel sounds, soft nontender to palpation, nondistended Extremities: No clubbing, cyanosis, or edema Skin: No evidence of breakdown, no evidence of rash Neurologic: Cranial nerves II through XII intact, motor strength is 5/5 in left deltoid, bicep, tricep, grip, hip flexor, knee extensors, ankle dorsiflexor and plantar flexor LUE 3- Delt, bi, tri, grip Sensory exam normal sensation to light touch and proprioception in bilateral upper and lower extremities Cerebellar exam intention tremor with L FNF  Musculoskeletal: no pain with AAROM but limited exam. No joint swelling   Assessment/Plan: 1. Functional deficits which require 3+ hours per day of interdisciplinary therapy in a comprehensive inpatient rehab setting. Physiatrist is providing close team supervision and 24 hour management of active medical problems listed below. Physiatrist and rehab team continue to assess barriers to  discharge/monitor patient progress toward functional and medical goals  Care Tool:  Bathing    Body parts bathed by patient: Left arm, Chest, Abdomen, Front perineal area, Right upper leg, Left upper leg, Face, Right arm, Buttocks, Left lower leg, Right lower leg   Body parts bathed by helper: Buttocks, Right arm, Right lower leg, Left lower leg     Bathing assist Assist Level: Contact Guard/Touching assist     Upper Body Dressing/Undressing Upper body dressing   What is the patient wearing?: Bra, Pull over shirt    Upper body assist Assist Level: Minimal Assistance - Patient > 75%    Lower Body Dressing/Undressing Lower body dressing      What is the patient wearing?: Pants, Underwear/pull up     Lower body assist Assist for lower body dressing: Contact Guard/Touching assist     Toileting Toileting    Toileting assist Assist for toileting: Contact Guard/Touching assist     Transfers Chair/bed transfer  Transfers assist     Chair/bed transfer assist level: Contact Guard/Touching assist     Locomotion Ambulation   Ambulation assist      Assist level: Minimal Assistance - Patient > 75% Assistive device: Walker-rolling Max distance: 30 ft   Walk 10 feet activity  Assist     Assist level: Minimal Assistance - Patient > 75% Assistive device: Walker-rolling   Walk 50 feet activity   Assist Walk 50 feet with 2 turns activity did not occur: Safety/medical concerns         Walk 150 feet activity   Assist Walk 150 feet activity did not occur: Safety/medical concerns         Walk 10 feet on uneven surface  activity   Assist Walk 10 feet on uneven surfaces activity did not occur: Safety/medical concerns         Wheelchair     Assist Is the patient using a wheelchair?: Yes Type of Wheelchair: Manual    Wheelchair assist level: Dependent - Patient 0%      Wheelchair 50 feet with 2 turns activity    Assist         Assist Level: Dependent - Patient 0%   Wheelchair 150 feet activity     Assist      Assist Level: Dependent - Patient 0%   Blood pressure 133/74, pulse 86, temperature 98.2 F (36.8 C), temperature source Oral, resp. rate 18, height 5\' 4"  (1.626 m), SpO2 98%.  Medical Problem List and Plan: 1. Functional deficits secondary to left hemiparesis secondary to hypertensive encephalopathy versus conversion disorder             -patient may shower             -ELOS/Goals: 7 to 10 days, supervision PT/OT, mod I SLP             -   2.  Antithrombotics: -DVT/anticoagulation:  Pharmaceutical: Lovenox              -antiplatelet therapy: asa 3. Pain Management: tylenol  prn 4. Mood/Behavior/Sleep: LCSW to follow             -antipsychotic agents:  5. Neuropsych/cognition: This patient is capable of making decisions on her own behalf. 6. Skin/Wound Care: Routine pressure relief measures.  7. Fluids/Electrolytes/Nutrition: Monitor I/O. Check CMET in am 8. HTN: Monitor BP TID--continue amlodipine  and HCTX Vitals:   01/30/24 0420 01/30/24 0806  BP: 133/74   Pulse: 86   Resp: 18   Temp: 98.2 F (36.8 C)   SpO2: 94% 98%    9. Sarcoidosis/Dyspnea: On breo with improvement in cough. Follwed by Dr. Alver Jobs. 10. Latent TB: Continue INH and pyridoxine  daily. Educate on importance of compliance. 11. Chronic Lumbar facet syndrome/Lumbar radiculopathy: Managed with local measures and s/p median branch block 07/2023 per [facility]               --Voltaren  gel added.              - Reviewed chart; similar presentation to her pain management presentations since 2020.  Has failed cyclobenzaprine  (Flexeril ), gabapentin  (Neurontin , Gralise ), Reports mild benefit with use of Diclofenac  but not has not used recently.              - Lumbar MRI 09/21/23:  1. Mild anterolisthesis of L4-L5 with possible bilateral pars defects at this level.  2. Multilevel lumbar spondylosis, most pronounced  at the L4-L5 and L5-S1 levels with marked facet arthropathy. There is mild spinal canal stenosis and subarticular recess narrowing at L4-L5 with disc material likely contacting the bilateral descending L5 nerve roots as described.  3. No substantial foraminal stenosis at any of the lumbar levels.                -  Add baclofen  10 mg 3 times daily as needed for muscle spasms   12. Hypokalemia: K-3.1 at admission. Is on hydrochlorothiazide .  --Will add  daily supplement for now.     Latest Ref Rng & Units 01/29/2024    4:55 AM 01/28/2024    7:02 PM 01/22/2024   12:28 PM  BMP  Glucose 70 - 99 mg/dL 409  811    BUN 6 - 20 mg/dL 22  26    Creatinine 9.14 - 1.00 mg/dL 7.82  9.56  2.13   Sodium 135 - 145 mmol/L 134  138    Potassium 3.5 - 5.1 mmol/L 4.2  3.6    Chloride 98 - 111 mmol/L 101  104    CO2 22 - 32 mmol/L 26  22    Calcium  8.9 - 10.3 mg/dL 9.0  08.6      13. Obesity: BMI 37.7. Educated on importance of weight loss and mobility to help promote overall health.         LOS: 2 days A FACE TO FACE EVALUATION WAS PERFORMED  Jackie Reed 01/30/2024, 8:59 AM

## 2024-01-30 NOTE — Progress Notes (Signed)
 Bea Lime, DO Physician Physical Medicine and Rehabilitation   PMR Pre-admission    Signed   Date of Service: 01/21/2024  3:27 PM  Related encounter: ED to Hosp-Admission (Discharged) from 01/19/2024 in [facility]   Signed     Expand All Collapse All  Show:Clear all [x] Written[x] Templated[x] Copied  Added by: [x] Burrel Legrand, Kristan Petit, RN[x] Bea Lime, DO[x] Alejo Amsler, Lauren Arlester Ladd, CCC-SLP  [] Hover for details PMR Admission Coordinator Pre-Admission Assessment   Patient: Jackie Reed is an 58 y.o., female MRN: [id number] DOB: 04/08/1966 Height: 5\' 4"  (162.6 cm) Weight: 99.6 kg   Insurance Information HMO:     PPO: yes     PCP:      IPA:      80/20:      OTHER:  PRIMARY: [facility]      Policy#: [id number]      Subscriber: patient CM Name: Dwia      Phone#: [phone] ext [phone]  Fax#: [phone] Pre-Cert#: [id number]  approved for 7 days 01/28/24 until 02/03/24. Updates due 6/13.     Employer: [employer] Benefits:  Phone #: [phone]     Name: 6/3 Eff. Date: 08/25/23     Deduct: $2800      Out of Pocket Max: $5600      Life Max: none CIR: 80%      SNF: 80% Outpatient: 80%     Co-Pay: 20% Home Health: 80%      Co-Pay: 20% DME: 80%     Co-Pay: 20% Providers: in-network  SECONDARY:       Policy#:      Phone#:    Artist:       Phone#:    The Data processing manager" for patients in Inpatient Rehabilitation Facilities with attached "Privacy Act Statement-Health Care Records" was provided and verbally reviewed with: Patient and Family   Emergency Contact Information Contact Information       Name Relation Home Work Mobile    [place] Spouse     [phone]    Lei Pump [phone]   [phone]    French,Charles Brother [phone]   [phone]         Other Contacts   None on File      Current Medical History  Patient Admitting Diagnosis: hypertensive  encephalopathy   History of Present Illness:  Jackie Reed is a 58 y.o. female with PMH of HTN, sarcoidosis, latent TB undergoing treatment, who presented to [facility] with sudden onset left arm and leg weakness on 5/28. Presented to [facility].She was noted to be extremely hypertensive with systolic >220's and received labetalol  and hydralazine  prior to administration of TNK. She was then transferred to [facility] ICU on 01/19/24 for monitoring and blood pressure control.    MRI completed 5/29 which was negative for acute infarct. Concerning for HTN encephalopathy vs conversion disorder. Placed on ASA. Home HCTZ resumed. Added amlodipine . LDL 84 and added rosuvastatin .    She was noted to have left upper extremity tremors with concerns for focal motor seizure, however spot EEG was negative. With concern for possible cervical spine pathology contributing to weakness MRI was completed which showed minor cord flattening without cord signal changes or significant spinal stenosis.   Complete NIHSS TOTAL: 2   Patient's medical record [facility] and [facility] has been reviewed by the rehabilitation admission coordinator and physician.   Past Medical History      Past Medical History:  Diagnosis Date   Dyspnea      occasional   E-coli UTI 08/2019   Hepatitis 05/2020    Hepatitis B core antibody positive   Hypertension     Migraine     Plantar fibromatosis     PONV (postoperative nausea and vomiting)     Pre-diabetes     Thyroid  nodule 01/29/2023    seen on PET scan   Vitamin B12 deficiency 06/2019   Vitamin D  deficiency 06/2019        Has the patient had major surgery during 100 days prior to admission? No   Family History   family history includes Diabetes in her mother; Heart failure in her father and mother; Hyperlipidemia in her father; Hypertension in her father.   Current Medications  Current Medications    Current Facility-Administered Medications:    acetaminophen   (TYLENOL ) tablet 975 mg, 975 mg, Oral, Q8H PRN, de Thayne Fine, Cortney E, NP, 975 mg at 01/28/24 1013   amLODipine  (NORVASC ) tablet 10 mg, 10 mg, Oral, Daily, Consuelo Denmark, MD, 10 mg at 01/28/24 1006   aspirin  EC tablet 81 mg, 81 mg, Oral, Daily, Consuelo Denmark, MD, 81 mg at 01/28/24 1006   docusate sodium  (COLACE) capsule 100 mg, 100 mg, Oral, BID, Palikh, Gaurang M, MD, 100 mg at 01/28/24 1006   enoxaparin  (LOVENOX ) injection 50 mg, 50 mg, Subcutaneous, Q24H, Sethi, Pramod S, MD, 50 mg at 01/28/24 1223   fluticasone  furoate-vilanterol (BREO ELLIPTA ) 100-25 MCG/ACT 1 puff, 1 puff, Inhalation, Daily, Augustin Leber, MD, 1 puff at 01/28/24 1610   hydrochlorothiazide  (HYDRODIURIL ) tablet 25 mg, 25 mg, Oral, Daily, Consuelo Denmark, MD, 25 mg at 01/28/24 1006   isoniazid  (NYDRAZID ) tablet 300 mg, 300 mg, Oral, q AM, Paytes, Austin A, RPH, 300 mg at 01/28/24 1006   Oral care mouth rinse, 15 mL, Mouth Rinse, PRN, Augustin Leber, MD   pyridOXINE  (VITAMIN B6) tablet 50 mg, 50 mg, Oral, Daily, Augustin Leber, MD, 50 mg at 01/28/24 1006   rosuvastatin  (CRESTOR ) tablet 20 mg, 20 mg, Oral, Daily, de Thayne Fine, Hopewell E, NP, 20 mg at 01/28/24 1006     Patients Current Diet:  Diet Order                  Diet regular Room service appropriate? Yes; Fluid consistency: Thin  Diet effective now                       Precautions / Restrictions Precautions Precautions: Fall Restrictions Weight Bearing Restrictions Per Provider Order: No    Has the patient had 2 or more falls or a fall with injury in the past year? No   Prior Activity Level Community (5-7x/wk): gets out of hosue ~3 days/week; drives   Prior Functional Level Self Care: Did the patient need help bathing, dressing, using the toilet or eating? Independent   Indoor Mobility: Did the patient need assistance with walking from room to room (with or without device)? Independent   Stairs: Did the patient need assistance with  internal or external stairs (with or without device)? Independent   Functional Cognition: Did the patient need help planning regular tasks such as shopping or remembering to take medications? Independent   Patient Information Are you of Hispanic, Latino/a,or Spanish origin?: A. No, not of Hispanic, Latino/a, or Spanish origin What is your race?: B. Black or African American Do you need or want an interpreter to communicate with a doctor  or health care staff?: 0. No   Patient's Response To:  Health Literacy and Transportation Is the patient able to respond to health literacy and transportation needs?: Yes Health Literacy - How often do you need to have someone help you when you read instructions, pamphlets, or other written material from your doctor or pharmacy?: Never In the past 12 months, has lack of transportation kept you from medical appointments or from getting medications?: No In the past 12 months, has lack of transportation kept you from meetings, work, or from getting things needed for daily living?: No   Home Assistive Devices / Equipment Home Equipment: None   Prior Device Use: Indicate devices/aids used by the patient prior to current illness, exacerbation or injury? None of the above   Current Functional Level Cognition   Arousal/Alertness: Awake/alert Overall Cognitive Status: Impaired/Different from baseline Orientation Level: Oriented X4 Attention: Sustained Sustained Attention: Impaired Sustained Attention Impairment: Verbal complex Memory: Appears intact Awareness: Appears intact Problem Solving: Impaired Problem Solving Impairment: Verbal complex Safety/Judgment: Appears intact    Extremity Assessment (includes Sensation/Coordination)   Upper Extremity Assessment: LUE deficits/detail LUE Deficits / Details: continued ataxic movements, appears to exacerbate when stressed or overwhelmed LUE Sensation: WNL LUE Coordination: decreased gross motor, decreased  fine motor  Lower Extremity Assessment: Overall WFL for tasks assessed, LLE deficits/detail LLE Deficits / Details: ataxic movement noted in the LLE intermittently during gait with 2+/5 L hip strength intermittently  and knee flexion with low floor clearance.     ADLs   Overall ADL's : Needs assistance/impaired Eating/Feeding: Sitting, Minimal assistance Eating/Feeding Details (indicate cue type and reason): min A, unable to hold utensils with dominant LUE. LUE ataxic, has been using RUE to eat- ordering simple hand held meals. Grooming: Wash/dry hands, Wash/dry face, Oral care, Sitting, Standing, Minimal assistance Grooming Details (indicate cue type and reason): pt. able to stand for washing hands, guiding L hand and allowing it to reduce to minimal ataxic movements then guiding R hand to help wash it, pt. able to also take L hand and guide it to wash R also.  washed face primarily with R hand.  for oral care pt. performed in sitting due to fatigue from standing.  utilized basin, and cup for rinsing. Upper Body Bathing: Sitting, Minimal assistance Lower Body Bathing: Moderate assistance, Sitting/lateral leans Upper Body Dressing : Moderate assistance, Sitting Lower Body Dressing: Contact guard assist, Bed level Lower Body Dressing Details (indicate cue type and reason): able to don L sock Toilet Transfer: Minimal assistance, Rolling walker (2 wheels), Ambulation, Cueing for sequencing, Cueing for safety Toilet Transfer Details (indicate cue type and reason): bed to chair Toileting- Clothing Manipulation and Hygiene: Moderate assistance, Sit to/from stand Functional mobility during ADLs: Minimal assistance, Rolling walker (2 wheels) General ADL Comments: Patient making steady progress towards goals. Patient session focus on cognition and further assessment for functional independence.Patient completing "Mixed Up Grocery List" activity, and able to sort through word bank, complete process of  elimination and fix one error without OT assist. Of note, patient is using R hand to write but is typically L handed. OT noting minimal to no tremors when patient was completing activity using L index as a pointer in the wordbank, but tremulous with attempting to write her name. Strategies provided to promote [facility] throughout the day.  Patient continues to be an excellent candidate for intensive rehab >3 hours. OT will continue to follow.     Mobility   Overal bed mobility: Needs Assistance  Bed Mobility: Supine to Sit Supine to sit: Contact guard General bed mobility comments: received in recliner     Transfers   Overall transfer level: Needs assistance Equipment used: Rolling walker (2 wheels) Transfers: Sit to/from Stand Sit to Stand: Min assist Bed to/from chair/wheelchair/BSC transfer type:: Step pivot Step pivot transfers: Min assist General transfer comment: Cues for hand placement, able to complete min A solely for safety, increased time required     Ambulation / Gait / Stairs / Wheelchair Mobility   Ambulation/Gait Ambulation/Gait assistance: Min assist, +2 safety/equipment, Mod assist Gait Distance (Feet): 105 Feet (75+30) Assistive device: Rolling walker (2 wheels) Gait Pattern/deviations: Step-to pattern, Ataxic, Decreased step length - left, Decreased stance time - left General Gait Details: Very slow gait with poor floor clearance, step to gait pattern with decreased step length and stance time on the L with intermittent ataxic movements of the LLE with poor progression anteriorly.  Pt with tremor at times in left LE and UE which worsened the longer she ambulated. Mod A once fatigued. Cues for longer step length on LLE. Trialed mirror in front of pt for longer step length however only slight improvement noted in step length. Gait velocity: decreased Gait velocity interpretation: <1.31 ft/sec, indicative of household ambulator     Posture / Balance Dynamic Sitting  Balance Sitting balance - Comments: CGA sitting EOB Balance Overall balance assessment: Needs assistance Sitting-balance support: No upper extremity supported, Feet supported Sitting balance-Leahy Scale: Fair Sitting balance - Comments: CGA sitting EOB Standing balance support: Reliant on assistive device for balance, Bilateral upper extremity supported, During functional activity Standing balance-Leahy Scale: Poor Standing balance comment: reliant on RW     Special needs/care consideration External Urinary Catheter    Previous Home Environment Living Arrangements: Spouse/significant other  Lives With: Spouse Available Help at Discharge: Family, Available 24 hours/day Type of Home: House Home Layout: Other (Comment) (split level) Alternate Level Stairs-Number of Steps: 5 stairs down to den; 6 stairs up to bedroom Home Access: Stairs to enter Entrance Stairs-Rails: None Entrance Stairs-Number of Steps: 2-higher steps Bathroom Shower/Tub: Engineer, manufacturing systems: Standard Bathroom Accessibility: Yes How Accessible: Accessible via walker Home Care Services: No 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, Sports coach   Discharge Living Setting Plans for Discharge Living Setting: Patient's home Type of Home at Discharge: House Discharge Home Layout: Other (Comment) (split level) Discharge Home Access: Stairs to enter Entrance Stairs-Rails: None Entrance Stairs-Number of Steps: 2 Discharge Bathroom Shower/Tub: Tub/shower unit Discharge Bathroom Toilet: Standard Discharge Bathroom Accessibility: Yes How Accessible: Accessible via walker   Social/Family/Support Systems Anticipated Caregiver: [name] (husband) and Satira Curet (sister-in-law) Anticipated Caregiver's Contact Information: [name]: [phone] Silvers: [phone] Caregiver Availability: Other (Comment) (close to 24/7 support) Discharge Plan Discussed with Primary Caregiver: Yes Is Caregiver In Agreement with  Plan?: Yes Does Caregiver/Family have Issues with Lodging/Transportation while Pt is in Rehab?: No   Her sister in Law can provide assistance when her spouse works. He works as a Investment banker, operational at [employer].    Goals Patient/Family Goal for Rehab: Mod I-Supervision: PT/OT, Mod I: ST Expected length of stay: 7-10 days Pt/Family Agrees to Admission and willing to participate: Yes Program Orientation Provided & Reviewed with Pt/Caregiver Including Roles  & Responsibilities: Yes   Decrease burden of Care through IP rehab admission: NA   Possible need for SNF placement upon discharge: Not anticipated   Patient Condition: This patient's condition remains as documented in the consult dated 01/24/2024, in which the Rehabilitation Physician  determined and documented that the patient's condition is appropriate for intensive rehabilitative care in an inpatient rehabilitation facility. Will admit to inpatient rehab today.   Preadmission Screen Completed By:  Jeannetta Millman RN MSN, 01/28/2024 1:40 PM ______________________________________________________________________   Discussed status with Dr. Dorn Gaskins on 01/28/24 at 1343 and received approval for admission today.   Admission Coordinator:  Jeannetta Millman RN MSN, time 1343 Date 01/28/24      MD Signature:   Bea Lime, DO 01/28/2024           Revision History  Routing History

## 2024-01-30 NOTE — Progress Notes (Signed)
 Occupational Therapy Session Note  Patient Details  Name: Jackie Reed MRN: [id number] Date of Birth: 06-May-1966  Today's Date: 01/30/2024 OT Individual Time: 3474-2595 OT Individual Time Calculation (min): 75 min    Short Term Goals: Week 1:  OT Short Term Goal 1 (Week 1): See LTG's due to ELOS  Skilled Therapeutic Interventions/Progress Updates:    1:1 Pt received in the recliner finishing breakfast. Self care retraining at shower level. Pt ambulated to the bathroom with RW with contact guard. Pt with inconsistant ability to pick up left foot in session - more difficulty in ambulation but functionally with bathing and dressing was successful. Pt able to wash all parts sit to stand with contact guard. Pt successful with donning bra today - fastening in the front and turning it around. Items placed on bedside table on the left to encourage functional reach of left Ue to retrieve items. Pt was able to do it with some tremors with reach away from BOS. Pt was able to dress with contact guard and extra time. Pt's left Ue performed well with encouragement. distraction and extra time to be successful. Pt ambulated to sink with RW with increased dorsiflexion -less delay with shoe on. Pt stood at sink with contact guard to supervision to brush teeth and performed with dominant left  with intermittent tremors.   Pt ambulated to the RN station and back to her room at a slow speed due to tremors in left LE when going to step but none noted with full weight bearing with stepping with the right LE. There is a delay with bringing the foot forward but can advance the foot past her right.   Pt left sitting up in recliner with RN to pass meds.   Therapy Documentation Precautions:  Precautions Precautions: Fall Recall of Precautions/Restrictions: Intact Restrictions Weight Bearing Restrictions Per Provider Order: No  Pain:  No c/ pain in session   Therapy/Group: Individual Therapy  Henrene Locust  [facility] 01/30/2024, 9:25 AM

## 2024-01-31 ENCOUNTER — Inpatient Hospital Stay (HOSPITAL_COMMUNITY)

## 2024-01-31 DIAGNOSIS — G9340 Encephalopathy, unspecified: Secondary | ICD-10-CM | POA: Diagnosis not present

## 2024-01-31 LAB — CBC
HCT: 38.4 % (ref 36.0–46.0)
Hemoglobin: 11.9 g/dL — ABNORMAL LOW (ref 12.0–15.0)
MCH: 22.3 pg — ABNORMAL LOW (ref 26.0–34.0)
MCHC: 31 g/dL (ref 30.0–36.0)
MCV: 71.9 fL — ABNORMAL LOW (ref 80.0–100.0)
Platelets: 412 10*3/uL — ABNORMAL HIGH (ref 150–400)
RBC: 5.34 MIL/uL — ABNORMAL HIGH (ref 3.87–5.11)
RDW: 14.8 % (ref 11.5–15.5)
WBC: 6.2 10*3/uL (ref 4.0–10.5)
nRBC: 0 % (ref 0.0–0.2)

## 2024-01-31 LAB — URINALYSIS, W/ REFLEX TO CULTURE (INFECTION SUSPECTED): RBC / HPF: >50 RBC/hpf (ref 0–5)

## 2024-01-31 LAB — HEPATIC FUNCTION PANEL
ALT: 21 U/L (ref 0–44)
AST: 37 U/L (ref 15–41)
Albumin: 3.4 g/dL — ABNORMAL LOW (ref 3.5–5.0)
Alkaline Phosphatase: 147 U/L — ABNORMAL HIGH (ref 38–126)
Bilirubin, Direct: <0.1 mg/dL (ref 0.0–0.2)
Total Bilirubin: 0.7 mg/dL (ref 0.0–1.2)
Total Protein: 8 g/dL (ref 6.5–8.1)

## 2024-01-31 MED ORDER — SULFAMETHOXAZOLE-TRIMETHOPRIM 800-160 MG PO TABS
1.0000 | ORAL_TABLET | Freq: Two times a day (BID) | ORAL | Status: AC
  Administered 2024-01-31 – 2024-02-02 (×6): 1 via ORAL
  Filled 2024-01-31 (×7): qty 1

## 2024-01-31 NOTE — Progress Notes (Signed)
 Inpatient Rehabilitation  Patient information reviewed and entered into eRehab system by [facility]. Karen Kays., CCC/SLP, PPS Coordinator.  Information including medical coding, functional ability and quality indicators will be reviewed and updated through discharge.

## 2024-01-31 NOTE — Progress Notes (Signed)
 [facility] Individual Statement of Services  Patient Name:  Jackie Reed  Date:  01/31/2024  Welcome to the [facility].  Our goal is to provide you with an individualized program based on your diagnosis and situation, designed to meet your specific needs.  With this comprehensive rehabilitation program, you will be expected to participate in at least 3 hours of rehabilitation therapies Monday-Friday, with modified therapy programming on the weekends.  Your rehabilitation program will include the following services:  Physical Therapy (PT), Occupational Therapy (OT), Speech Therapy (ST), 24 hour per day rehabilitation nursing, Therapeutic Recreaction (TR), Neuropsychology, Care Coordinator, Rehabilitation Medicine, Nutrition Services, and Pharmacy Services  Weekly team conferences will be held on Wednesday to discuss your progress.  Your Inpatient Rehabilitation Care Coordinator will talk with you frequently to get your input and to update you on team discussions.  Team conferences with you and your family in attendance may also be held.  Expected length of stay: 8-11 days  Overall anticipated outcome: supervision with cues  Depending on your progress and recovery, your program may change. Your Inpatient Rehabilitation Care Coordinator will coordinate services and will keep you informed of any changes. Your Inpatient Rehabilitation Care Coordinator's name and contact numbers are listed  below.  The following services may also be recommended but are not provided by the [facility]:  Driving Evaluations Home Health Rehabiltiation Services Outpatient Rehabilitation Services Vocational Rehabilitation   Arrangements will be made to provide these services after discharge if needed.  Arrangements include referral to agencies that provide these services.  Your insurance has been verified to be:  [name] Your primary doctor is:   Rayma Calandra  Pertinent information will be shared with your doctor and your insurance company.  Inpatient Rehabilitation Care Coordinator:  Adrianna Albee, Buzz Cass [phone] or Justine Oms  Information discussed with and copy given to patient by: [name], 01/31/2024, 10:01 AM

## 2024-01-31 NOTE — Progress Notes (Signed)
 Occupational Therapy Session Note  Patient Details  Name: Jackie Reed MRN: [id number] Date of Birth: Dec 09, 1965  Today's Date: 01/31/2024 OT Individual Time: 1115-1206 OT Individual Time Calculation (min): 51 min    Short Term Goals: Week 1:  OT Short Term Goal 1 (Week 1): See LTG's due to ELOS  Skilled Therapeutic Interventions/Progress Updates:    Patient received supine in bed reporting pain in groin/ lower abdomen.  Patient indicates pain down to an 8 from 10 - and reports voiding all blood. Patient willing to get up out of bed to get washed and dressed.  Once at edge of bed indicated need to void - walked to bathroom with contact guard to supervision for continent void on toilet, urine mildly discolored.  Patient expressing relief.  Patient washed at edge of bed after set up, and dressed herself with increased time.  Patient with ataxic movement noted during transitions - e.g. full stand to partial squat.  Ataxia subsiding per patient - less prevalent and lasting less long.  Worked on self feeding and handwriting at end of session, teaching strategies to stabilize forearm.  Patient walked to recliner at end of session.  Personal items in reach.   Therapy Documentation Precautions:  Precautions Precautions: Fall Recall of Precautions/Restrictions: Intact Restrictions Weight Bearing Restrictions Per Provider Order: No   Pain: Pain Assessment Pain Scale: 0-10 Pain Score: 8 Pain Location: Abdomen Pain Intervention(s): Felt medicine was starting to help    Therapy/Group: Individual Therapy  Aowyn Rozeboom M 01/31/2024, 12:28 PM

## 2024-01-31 NOTE — Progress Notes (Signed)
 Physical Therapy Session Note  Patient Details  Name: FAJR FIFE MRN: [id number] Date of Birth: 1965/09/14  Today's Date: 01/31/2024 PT Individual Time: 1225-1245 PT Individual Time Calculation (min): 20 min   Short Term Goals: Week 1:  PT Short Term Goal 1 (Week 1): Pt will ambulate with foot clearance for short distances PT Short Term Goal 2 (Week 1): pt will navigate 6" stairs with 1 rail and assist PT Short Term Goal 3 (Week 1): Pt will perform STS with supervision  Skilled Therapeutic Interventions/Progress Updates:      Pt seated in recliner upon arrival. Pt agreeable to therapy. Pt denies any pain.   PT present to make up missed minutes.   Pt requesting to work on IT consultant.   Pt performed 1x4 step ups on 6 inch step with B HR and min A for management of L LE ataxia, verbal and tactile cues provided for L LE hip/knee flexion.   Pt performed 2x5 L LE toe taps on 6 inch step with B HR with 1.5# ankle weight on L LE, pt demos improved L LE hip and knee flexion for advancement, but drags L LE on step for hip extension to return L foot to floor.   Pt with OT at end of session.   Therapy Documentation Precautions:  Precautions Precautions: Fall Recall of Precautions/Restrictions: Intact Restrictions Weight Bearing Restrictions Per Provider Order: No  Therapy/Group: Individual Therapy  [facility] [place], [place], DPT  01/31/2024, 2:50 PM

## 2024-01-31 NOTE — Progress Notes (Signed)
 Physical Therapy Note  Patient Details  Name: Jackie Reed MRN: [id number] Date of Birth: 04-28-1966 Today's Date: 01/31/2024    Today's Date: 01/31/2024 PT Missed Time: 75 Minutes Missed Time Reason: Pain;Patient unwilling to participate    Pt reporting new onset bloody urine and pain in abdomen with mobility and movement, requesting to attempt session at later time. Pt missing 75 minutes of skilled therapy at this time. Therapist will reattempt as schedule allows.   Annia Kilts PT, DPT 01/31/2024, 10:30 AM

## 2024-01-31 NOTE — Progress Notes (Incomplete)
 Call to patient's room to assess and assist with patient general concerns ,emotional and physical support provided. Pain medications, ADL care,  incontinent care and repositioning for comfort administered. Monitor and assisted.

## 2024-01-31 NOTE — Progress Notes (Signed)
 Patient ID: Jackie Reed, female   DOB: 10-19-1965, 58 y.o.   MRN: [id number] Met with the patient to review current medical situation, rehab process, team conference and plan of care. Reports new onset of Jackie Reed shooting abd. Pain with blood/clots in urine. UA ordered per MD with abd xray; pending.  Reports constipation addressed; LBM 01/29/24.  Reviewed medication and dietary modification recommendations.  Tremors notes left UE; reports tremors are getting less noticeable. Continue to follow along to address educational needs to facilitate preparation for discharge. Naoma Bacca

## 2024-01-31 NOTE — Progress Notes (Signed)
 Physical Therapy Session Note  Patient Details  Name: Jackie Reed MRN: [id number] Date of Birth: October 01, 1965  Today's Date: 01/31/2024 PT Individual Time: 0802-0832 PT Individual Time Calculation (min): 30 min  and Today's Date: 01/31/2024 PT Missed Time: 15 Minutes Missed Time Reason: Pain;Patient unwilling to participate  Short Term Goals: Week 1:  PT Short Term Goal 1 (Week 1): Pt will ambulate with foot clearance for short distances PT Short Term Goal 2 (Week 1): pt will navigate 6" stairs with 1 rail and assist PT Short Term Goal 3 (Week 1): Pt will perform STS with supervision  Skilled Therapeutic Interventions/Progress Updates:     Pt received supine in bed and is moaning in discomfort. Pt reports 13/10 pain in lower abdomen, sharp in nature. Pt reports that she had just taken tylenol  and PT alerts RN to pain as well. Pt reporting that she has been urinating blood this AM and requests to use toilet. PT provides assistance to don non-skid socks and pt performs supine to sit slowly with bed features and cues for safe positioning at EOB. Sit to stand with RW and CGA, with cues for initiation. Pt ambulates x20' to toilet with RW and CGA, with cues for upright posture to improve balance, ensuring pt performs adequate step height with LLE, with pt demonstrating tendency to drag Lt food during swing phase, and safe AD management when entering bathroom and transitioning to toilet. Following urination, pt ambulates back to bed with same assistance and cues. Pt politely defers additional therapy at this time secondary to pain. PT provides cues for positioning to return to supine in position of comfort and stability. Left with all needs within reach. RN informed that pt has urine sample in toilet hat. Pt misses 15 minutes of skilled PT.   Therapy Documentation Precautions:  Precautions Precautions: Fall Recall of Precautions/Restrictions: Intact Restrictions Weight Bearing Restrictions Per  Provider Order: No   Therapy/Group: Individual Therapy  Neva Barban, PT, DPT 01/31/2024, 8:38 AM

## 2024-01-31 NOTE — Progress Notes (Addendum)
 PROGRESS NOTE   Subjective/Complaints: C/o of new onset hematuria and bladder pain, says UA/UC was just drawn and sent, says symptoms started suddenly at 3am, says she was on plavix  ROS: +hematuria  Objective:   DG Abd 1 View Result Date: 01/31/2024 CLINICAL DATA:  Abdominal pain EXAM: ABDOMEN - 1 VIEW COMPARISON:  None Available. FINDINGS: A moderate to large volume of stool material is present throughout the colon. Air is present within the rectum. IMPRESSION: 1. A moderate to large volume of stool material is present in the colon. Electronically Signed   By: Reagan Camera M.D.   On: 01/31/2024 10:04   Recent Labs    01/29/24 0455 01/31/24 0518  WBC 5.8 6.2  HGB 11.8* 11.9*  HCT 38.4 38.4  PLT 394 412*   Recent Labs    01/28/24 1902 01/29/24 0455  NA 138 134*  K 3.6 4.2  CL 104 101  CO2 22 26  GLUCOSE 130* 106*  BUN 26* 22*  CREATININE 0.85 0.86  CALCIUM  10.0 9.0   No intake or output data in the 24 hours ending 01/31/24 1038       Physical Exam: Vital Signs Blood pressure 120/87, pulse 87, temperature 98.6 F (37 C), temperature source Oral, resp. rate 18, height 5\' 4"  (1.626 m), SpO2 95%.   General: No acute distress Mood and affect are appropriate Heart: Regular rate and rhythm no rubs murmurs or extra sounds Lungs: Clear to auscultation, breathing unlabored, no rales or wheezes Abdomen: Positive bowel sounds, soft nontender to palpation, nondistended Extremities: No clubbing, cyanosis, or edema Skin: No evidence of breakdown, no evidence of rash Neurologic: Cranial nerves II through XII intact MMT: 5/5 strength right side 4/5 strength left side Sensory exam normal sensation to light touch and proprioception in bilateral upper and lower extremities Cerebellar exam intention tremor with L FNF  Musculoskeletal: no pain with AAROM but limited exam. No joint swelling   Assessment/Plan: 1. Functional  deficits which require 3+ hours per day of interdisciplinary therapy in a comprehensive inpatient rehab setting. Physiatrist is providing close team supervision and 24 hour management of active medical problems listed below. Physiatrist and rehab team continue to assess barriers to discharge/monitor patient progress toward functional and medical goals  Care Tool:  Bathing    Body parts bathed by patient: Left arm, Chest, Abdomen, Front perineal area, Right upper leg, Left upper leg, Face, Right arm, Buttocks, Left lower leg, Right lower leg   Body parts bathed by helper: Buttocks, Right arm, Right lower leg, Left lower leg     Bathing assist Assist Level: Contact Guard/Touching assist     Upper Body Dressing/Undressing Upper body dressing   What is the patient wearing?: Bra, Pull over shirt    Upper body assist Assist Level: Minimal Assistance - Patient > 75%    Lower Body Dressing/Undressing Lower body dressing      What is the patient wearing?: Pants, Underwear/pull up     Lower body assist Assist for lower body dressing: Contact Guard/Touching assist     Toileting Toileting    Toileting assist Assist for toileting: Contact Guard/Touching assist     Transfers Chair/bed transfer  Transfers assist  Chair/bed transfer activity did not occur: Safety/medical concerns  Chair/bed transfer assist level: Contact Guard/Touching assist     Locomotion Ambulation   Ambulation assist      Assist level: Minimal Assistance - Patient > 75% Assistive device: Walker-rolling Max distance: 60   Walk 10 feet activity   Assist     Assist level: Minimal Assistance - Patient > 75% Assistive device: Walker-rolling   Walk 50 feet activity   Assist Walk 50 feet with 2 turns activity did not occur: Safety/medical concerns  Assist level: Minimal Assistance - Patient > 75% Assistive device: Walker-rolling    Walk 150 feet activity   Assist Walk 150 feet activity did  not occur: Safety/medical concerns         Walk 10 feet on uneven surface  activity   Assist Walk 10 feet on uneven surfaces activity did not occur: Safety/medical concerns         Wheelchair     Assist Is the patient using a wheelchair?: Yes Type of Wheelchair: Manual    Wheelchair assist level: Dependent - Patient 0%      Wheelchair 50 feet with 2 turns activity    Assist        Assist Level: Dependent - Patient 0%   Wheelchair 150 feet activity     Assist      Assist Level: Dependent - Patient 0%   Blood pressure 120/87, pulse 87, temperature 98.6 F (37 C), temperature source Oral, resp. rate 18, height 5\' 4"  (1.626 m), SpO2 95%.  Medical Problem List and Plan: 1. Functional deficits secondary to left hemiparesis secondary to hypertensive encephalopathy versus conversion disorder             -patient may shower             -ELOS/Goals: 7 to 10 days, supervision PT/OT, mod I SLP             - Grounds pass ordered  -IPOC completed  2.  Antithrombotics: -DVT/anticoagulation:  Pharmaceutical: Lovenox              -antiplatelet therapy: asa  3. Hematuria: UA/UC ordered. KUB ordered and shows moderate stool burden. OB/GYN consulted. Bactrim  started given positive UA, f/u UC  4. Constipation: asked nursing to check with patient if she would like to try milk of magnesia  5. Neuropsych/cognition: This patient is capable of making decisions on her own behalf.  6. Skin/Wound Care: Routine pressure relief measures.   7. Fluids/Electrolytes/Nutrition: Monitor I/O. Check CMET in am  8. HTN: Monitor BP TID--continue amlodipine  and HCTX Vitals:   01/30/24 1834 01/31/24 0544  BP: 122/77 120/87  Pulse: 87 87  Resp: 16 18  Temp: 98.7 F (37.1 C) 98.6 F (37 C)  SpO2: 98% 95%    9. Sarcoidosis/Dyspnea: On breo with improvement in cough. Follwed by Dr. Alver Jobs.  10. Latent TB: Continue INH and pyridoxine  daily. Educate on importance of  compliance.  11. Chronic Lumbar facet syndrome/Lumbar radiculopathy: Managed with local measures and s/p median branch block 07/2023 per [facility]               --Voltaren  gel added.              - Reviewed chart; similar presentation to her pain management presentations since 2020.  Has failed cyclobenzaprine  (Flexeril ), gabapentin  (Neurontin , Gralise ), Reports mild benefit with use of Diclofenac  but not has not used recently.              -  Lumbar MRI 09/21/23:  1. Mild anterolisthesis of L4-L5 with possible bilateral pars defects at this level.  2. Multilevel lumbar spondylosis, most pronounced at the L4-L5 and L5-S1 levels with marked facet arthropathy. There is mild spinal canal stenosis and subarticular recess narrowing at L4-L5 with disc material likely contacting the bilateral descending L5 nerve roots as described.  3. No substantial foraminal stenosis at any of the lumbar levels.                - Add baclofen  10 mg 3 times daily as needed for muscle spasms   12. Hypokalemia: K-3.1 at admission. Is on hydrochlorothiazide .  --Will add  daily supplement for now.     Latest Ref Rng & Units 01/29/2024    4:55 AM 01/28/2024    7:02 PM 01/22/2024   12:28 PM  BMP  Glucose 70 - 99 mg/dL 191  478    BUN 6 - 20 mg/dL 22  26    Creatinine 2.95 - 1.00 mg/dL 6.21  3.08  6.57   Sodium 135 - 145 mmol/L 134  138    Potassium 3.5 - 5.1 mmol/L 4.2  3.6    Chloride 98 - 111 mmol/L 101  104    CO2 22 - 32 mmol/L 26  22    Calcium  8.9 - 10.3 mg/dL 9.0  84.6      13. Class 2 obesity: BMI 37.7. Educated on importance of weight loss and mobility to help promote overall health.         LOS: 3 days A FACE TO FACE EVALUATION WAS PERFORMED  Keven Pel Aedan Geimer 01/31/2024, 10:38 AM

## 2024-01-31 NOTE — IPOC Note (Signed)
 Overall Plan of Care [facility]) Patient Details Name: Jackie Reed MRN: [id number] DOB: [birth year]  Admitting Diagnosis: Encephalopathy  Hospital Problems: Principal Problem:   Encephalopathy Active Problems:   Encephalopathy, hypertensive     Functional Problem List: Nursing Pain, Bowel, Safety, Endurance, Medication Management  PT Balance, Safety, Endurance, Motor, Sensory  OT Balance, Safety, Endurance, Motor  SLP    TR         Basic ADL's: OT Eating, Grooming, Bathing, Dressing, Toileting     Advanced  ADL's: OT Simple Meal Preparation, Light Housekeeping     Transfers: PT Bed Mobility, Bed to Chair, Customer service manager, Tub/Shower     Locomotion: PT Ambulation, Psychologist, prison and probation services, Stairs     Additional Impairments: OT Fuctional Use of Upper Extremity  SLP        TR      Anticipated Outcomes Item Anticipated Outcome  Self Feeding Modified independent  Swallowing      Basic self-care  Supervision  Toileting  Modified Independent   Bathroom Transfers Supervision  Bowel/Bladder  manage bowel w mod I assist  Transfers  supervision with LRAD  Locomotion  supervision short distance gait  Communication     Cognition     Pain  manage Pain < 4 with prns  Safety/Judgment  manage safety w cues   Therapy Plan: PT Intensity: Minimum of 1-2 x/day ,45 to 90 minutes PT Frequency: 5 out of 7 days PT Duration Estimated Length of Stay: 10-12 days OT Intensity: Minimum of 1-2 x/day, 45 to 90 minutes OT Frequency: 5 out of 7 days OT Duration/Estimated Length of Stay: 7-10 days     Team Interventions: Nursing Interventions Discharge Planning, Bowel Management, Disease Management/Prevention, Pain Management, Patient/Family Education  PT interventions Ambulation/gait training, Discharge planning, DME/adaptive equipment instruction, Functional mobility training, Pain management, Psychosocial support, Splinting/orthotics, Therapeutic Activities, UE/LE Strength  taining/ROM, Wheelchair propulsion/positioning, UE/LE Coordination activities, Therapeutic Exercise, Stair training, Patient/family education, Neuromuscular re-education, Functional electrical stimulation, Disease management/prevention, Firefighter, Warden/ranger  OT Interventions Warden/ranger, Discharge planning, Functional electrical stimulation, Self Care/advanced ADL retraining, Therapeutic Activities, Functional mobility training, Patient/family education, Therapeutic Exercise, Community reintegration, DME/adaptive equipment instruction, Neuromuscular re-education, UE/LE Strength taining/ROM, UE/LE Coordination activities  SLP Interventions    TR Interventions    SW/CM Interventions Discharge Planning, Psychosocial Support, Patient/Family Education   Barriers to Discharge MD  Medical stability  Nursing Decreased caregiver support, Home environment access/layout multi level 6 step up to bedroom and 5 step down to living room; Her sister in Law can provide assistance when her spouse works. He works as a Investment banker, operational at [employer].  PT Inaccessible home environment, Home environment access/layout, Decreased caregiver support    OT      SLP      SW       Team Discharge Planning: Destination: PT-Home ,OT- Home , SLP-  Projected Follow-up: PT-Outpatient PT, OT-  Home health OT, Outpatient OT, SLP-  Projected Equipment Needs: PT-To be determined, OT- 3 in 1 bedside comode, Tub/shower seat, To be determined, SLP-  Equipment Details: PT- , OT-  Patient/family involved in discharge planning: PT- Patient,  OT-Patient, SLP-   MD ELOS: 7-10 days Medical Rehab Prognosis:  Excellent Assessment: The patient has been admitted for CIR therapies with the diagnosis of encephalopathy. The team will be addressing functional mobility, strength, stamina, balance, safety, adaptive techniques and equipment, self-care, bowel and bladder mgt, patient and caregiver education.  Goals have been set at supervision. Anticipated discharge destination is  home.        See Team Conference Notes for weekly updates to the plan of care

## 2024-01-31 NOTE — Progress Notes (Signed)
 Inpatient Rehabilitation Care Coordinator Assessment and Plan Patient Details  Name: Jackie Reed MRN: [id number] Date of Birth: 05/19/1966  Today's Date: 01/31/2024  Hospital Problems: Principal Problem:   Encephalopathy Active Problems:   Encephalopathy, hypertensive  Past Medical History:  Past Medical History:  Diagnosis Date   Dyspnea    occasional   E-coli UTI 08/2019   Hepatitis 05/2020   Hepatitis B core antibody positive   Hypertension    Migraine    Plantar fibromatosis    PONV (postoperative nausea and vomiting)    Pre-diabetes    Sarcoidosis    TB lung, latent    Thyroid  nodule 01/29/2023   seen on PET scan   Vitamin B12 deficiency 06/2019   Vitamin D  deficiency 06/2019   Past Surgical History:  Past Surgical History:  Procedure Laterality Date   ABDOMINAL HYSTERECTOMY     FINE NEEDLE ASPIRATION  02/19/2023   Procedure: FINE NEEDLE ASPIRATION (FNA) LINEAR;  Surgeon: Prudy Brownie, DO;  Location: [facility];  Service: Cardiopulmonary;;   FOOT SURGERY Bilateral 1985   MASS EXCISION Right 05/19/2018   Procedure: EXCISION PALMAR NODULES RIGHT HAND;  Surgeon: Brunilda Capra, MD;  Location: [facility];  Service: Orthopedics;  Laterality: Right;   PARTIAL HYSTERECTOMY  1997   VIDEO BRONCHOSCOPY WITH ENDOBRONCHIAL ULTRASOUND Bilateral 02/19/2023   Procedure: VIDEO BRONCHOSCOPY WITH ENDOBRONCHIAL ULTRASOUND;  Surgeon: Prudy Brownie, DO;  Location: [facility];  Service: Cardiopulmonary;  Laterality: Bilateral;   Social History:  reports that she has never smoked. She has never used smokeless tobacco. She reports that she does not drink alcohol and does not use drugs.  Family / Support Systems Marital Status: Married Patient Roles: Spouse, Parent, Other (Comment) (employee/friend) Spouse/Significant Other: [name] [phone] Other Supports: Charles-brother [phone]  Eva-sister in-law [phone] Anticipated Caregiver: [name] and Marlea Silvers sister in-law to  assist Ability/Limitations of Caregiver: Andre-husband works and Marlea Silvers to be with while husband works Medical laboratory scientific officer: 24/7 Family Dynamics: Close knit with family and extended family all will pull together to assist her and make sure her needs are met.  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 Return to Work Plans: Will need to recover first to be able to return to work Marine scientist Issues: NA Guardian/Conservator: None-according to MD pt is capable of making her own decisions while here   Abuse/Neglect Abuse/Neglect Assessment Can Be Completed: Yes Physical Abuse: Denies Verbal Abuse: Denies Sexual Abuse: Denies Exploitation of patient/patient's resources: Denies Self-Neglect: Denies  Patient response to: Social Isolation - How often do you feel lonely or isolated from those around you?: Never  Emotional Status Pt's affect, behavior and adjustment status: Pt is motivated to do well and recover from this. She is in a lot of pain today and not feeling able to do therapies, but will try. MD is aware of this. She was independent prio to admission and worked from home. Recent Psychosocial Issues: other health issues Psychiatric History: HX-depression/anxiety would benefit from seeing neuro-psych while here Substance Abuse History: NA  Patient / Family Perceptions, Expectations & Goals Pt/Family understanding of illness & functional limitations: Pt can explain her health issues and hopeful she will make progress and recover from this and get back home.  She does talk with the MD's involved and feels understands her treatment plan moving  forward. Premorbid pt/family roles/activities: wife, siblng, employee, friend, etc Anticipated changes in roles/activities/participation: resume Pt/family expectations/goals: Pt states: " I hope to do well but am in a lot of pain right now."  [employer]: None Premorbid Home Care/DME Agencies: None Transportation available at discharge: husband Is the patient able to respond to transportation needs?: Yes In the past 12 months, has lack of transportation kept you from medical appointments or from getting medications?: No In the past 12 months, has lack of transportation kept you from meetings, work, or from getting things needed for daily living?: No Resource referrals recommended: Neuropsychology  Discharge Planning Living Arrangements: Spouse/significant other Support Systems: Spouse/significant other, Other relatives, Friends/neighbors Type of Residence: Private residence Insurance Resources: Media planner (specify) Programmer, systems) Surveyor, quantity Resources: Employment, Garment/textile technologist Screen Referred: No Living Expenses: Banker Management: Patient, Spouse Does the patient have any problems obtaining your medications?: No Home Management: self Patient/Family Preliminary Plans: Return home with hisband who is able to assist but does work first shift so sister in-law will be coming to assist. arrier is they live in a split level home with numerous steps. Will work on discharge needs. Care Coordinator Anticipated Follow Up Needs: HH/OP  Clinical Impression Pleasant female who is in pain and still trying to do therapies. Her husband and sister in-law will be assisting at discharge. Will work on discharge needs. Will update once team meeting on Wednesday  [name] 01/31/2024, 10:13 AM

## 2024-01-31 NOTE — Progress Notes (Addendum)
 Patient having new bloody urine this morning with small amount of blood clots. Patient reporting sharp pain to lower abdomen. Bladder scan 0. Russella Courts PA notified. Tylenol  administered. Awaiting lab results.   UA/Culture ordered  6962- follow up on pain reassessment, patient asleep. Call bell within reach

## 2024-01-31 NOTE — Progress Notes (Addendum)
 Occupational Therapy Session Note  Patient Details  Name: Jackie Reed MRN: [id number] Date of Birth: [birth year]  Today's Date: 01/31/2024 OT Individual Time: 1445-1525 OT Individual Time Calculation (min): 40 min    Short Term Goals: Week 1:  OT Short Term Goal 1 (Week 1): See LTG's due to ELOS  Skilled Therapeutic Interventions/Progress Updates:      Therapy Documentation Precautions:  Precautions Precautions: Fall Recall of Precautions/Restrictions: Intact Restrictions Weight Bearing Restrictions Per Provider Order: No General: "Hi Xavion Muscat!" Pt handoff from PT in main gym.   Pain: no pain reported  ADL: OT providing skilled intervention on ADL retraining in order to increase independence with tasks and increase activity tolerance. Pt completed the following tasks at the current level of assist: Grooming/oral hygiene: standing CGA reaching with RUE out of BOS at RW in unsupported standing to use hand sanitizer Toilet transfer: CGA ambulating ~20 ft from W/C to [facility] over toilet to urinate, noted clear pale yellow urine, pt pleased  Toileting: CGA, pt completing 3/3 steps of toileting with increased time, able to incorporate LUE with pants management with increased time.  Exercises: OT providing skilled intervention with LUE NMRE , grip/pinch strength, coordination and ataxia management. Pt participated in [facility] activity with resistive clothespins (yellow, red and green), pt retrieving clothespins  with LUE for increased dexterity and coordination in order to complete ADLs. Pt completed tasks with PRN assist from RUE in order to manage ~50% green clips d/t increased difficulty.    Other Treatments: OT providing skilled intervention with UE ranger in order to increase ROM and coordination of LUE. Pt completed anterior/posterior movements, side to side and clockwise/counterclockwise with slow pacing and 1x10 per movement. OT then issuing pink sponge for increased grip/pinch exercises to  complete in room and educated pt on UE self PROM in order to decrease bicep/shoulder tightness.   Pt seated in recliner at end of session with W/C alarm donned, call light within reach and 4Ps assessed.    Therapy/Group: Individual Therapy  Nila Barth, OTD, OTR/L 01/31/2024, 3:59 PM

## 2024-02-01 DIAGNOSIS — I674 Hypertensive encephalopathy: Secondary | ICD-10-CM | POA: Diagnosis not present

## 2024-02-01 MED ORDER — SORBITOL 70 % SOLN
30.0000 mL | Freq: Once | Status: AC
  Administered 2024-02-01: 30 mL via ORAL
  Filled 2024-02-01: qty 30

## 2024-02-01 NOTE — Progress Notes (Signed)
 Occupational Therapy Session Note  Patient Details  Name: Jackie Reed MRN: [id number] Date of Birth: 01-21-1966  Today's Date: 02/01/2024 OT Individual Time: 4098-1191 OT Individual Time Calculation (min): 45 min    Short Term Goals: Week 1:  OT Short Term Goal 1 (Week 1): See LTG's due to ELOS  Skilled Therapeutic Interventions/Progress Updates:    Pt received sitting EOB ,pass off from OT from previous session. She reported 7/10 pain in her flank from UTI- reporting she is awaiting pain medication from RN. She took extra time to problem solve/work through donning socks and shoes. She was even able to tie her shoes with extra time! She completed functional mobility to the sink with the RW with min A. She was able to brush her teeth within open chain with ataxia present but pt able to manage. She completed 50 ft of functional mobility with the RW, requiring cueing for LLE placement and widening BOS, min A. She was taken to the therapy gym via w/c the rest of the way 2/2 fatigue. She then used the BITS to address LUE functional reaching in open chain to really challenge ataxia and accuracy. Graded activity up by changing to a moving target in a rotator and making the target smaller. She completed 75+ repetitions. She ended with functional mobility back to her room, 75 ft with min A. Pt was left sitting up in the wheelchair with all needs met and call bell within reach.   Therapy Documentation Precautions:  Precautions Precautions: Fall Recall of Precautions/Restrictions: Intact Restrictions Weight Bearing Restrictions Per Provider Order: No  Therapy/Group: Individual Therapy  Una Ganser 02/01/2024, 8:57 AM

## 2024-02-01 NOTE — Progress Notes (Signed)
 Occupational Therapy Session Note  Patient Details  Name: Jackie Reed MRN: [id number] Date of Birth: 1965/12/21  Today's Date: 02/01/2024 OT Individual Time: 9562-1308 OT Individual Time Calculation (min): 70 min    Short Term Goals: Week 1:  OT Short Term Goal 1 (Week 1): See LTG's due to ELOS  Skilled Therapeutic Interventions/Progress Updates:  Skilled OT intervention completed with focus on functional ambulation, LUE ROM/strengthening, DC planning and DME education. Pt received seated in recliner, agreeable to session. No pain reported.  Pt requested to use bathroom. Completed all sit > stands and ambulatory transfers using RW with LLE ataxia noted with increased effort needed to advance with gait but able to do without cues or assist. Ambulated > toilet. Able to manage all toileting steps for continent urinary void with supervision. Ambulated > sink for supervised hand hygiene in stance then ambulated > w/c. Transported dependently in w/c due to fatigue from earlier therapies.   Stand pivot > EOM. Attempted bilateral shoulder flexion with unweighted bar in sitting, however pt only able to achieve 90 degrees on LUE. Transitioned > supine with wedge/bolster offered for comfort. Supine, pt completed the following to restore functional use needed for BADLs: (With 1 lb dowel) -bilateral shoulder flexion 2x10 -bilateral shoulder protraction 2x10 Pt required increased time and effort for task due to LUE ataxia and fatigue but pt effortful. Discussed OPOT vs HHOT for continued LUE strengthening; CSW notified.  Transitioned supine > sit with CGA. Ambulated > w/c. Transported > ADL bathroom. Pt reports bathroom set up as tub/shower with curtain. Initially discussed use of TTB for increasing safety with transferring in/out of shower and for conserving energy during bathing. Pt able to ambulated into bathroom > TTB, and able to transfer in/out of tub with supervision however required cues for L  knee extension vs hip flexion to lift LLE over threshold. Pt then shared she has both a shower curtain and a door in place. Discussed that TTB wouldn't work with door but she could trial stepping into tub with use of grab bar and if not safe/less independent, pt may consider taking the doors down for increased accessibility and aging in place. Handout issued for TTB.  Ambulated > w/c, transported > room then stand pivot > recliner. Pt remained seated in recliner, with chair alarm on/activated, and with all needs in reach at end of session.   Therapy Documentation Precautions:  Precautions Precautions: Fall Recall of Precautions/Restrictions: Intact Restrictions Weight Bearing Restrictions Per Provider Order: No    Therapy/Group: Individual Therapy  Ruthanna Covert, MS, OTR/L  02/01/2024, 2:20 PM

## 2024-02-01 NOTE — Progress Notes (Addendum)
 Occupational Therapy Session Note  Patient Details  Name: Jackie Reed MRN: [id number] Date of Birth: 1966-07-21  Today's Date: 02/01/2024 OT Individual Time: 5784-6962 OT Individual Time Calculation (min): 25 min    Short Term Goals: Week 1:  OT Short Term Goal 1 (Week 1): See LTG's due to ELOS  Skilled Therapeutic Interventions/Progress Updates:  Pt greeted sitting in recliner for skilled OT session with focus on BADL retraining.   Pain: Pt with un-rated abdominal pain, consistent with yesterday's sessions. OT offering intermediate rest breaks and positioning suggestions throughout session to address pain/fatigue and maximize participation/safety in session. Heat/ice offered as well, LPN made aware.   Functional Transfers: Room-level transfers with close supervision + RW, decreased movement speed of LLE.   Self Care Tasks: Pt completes the following self care tasks with levels of assistance noted below, UB: Setup/supervision with cuing for orientation of shirt.  LB: Increased time to manage LLE, but setup/supervision, light CGA with dynamic stance during clothing management and posterior care.  Ataxic LUE movements present throughout ADL tasks.   Pt remained in care of OT (direct handoff) with 4Ps assessed and immediate needs met. Pt continues to be appropriate for skilled OT intervention to promote further functional independence in ADLs/IADLs.   Therapy Documentation Precautions:  Precautions Precautions: Fall Recall of Precautions/Restrictions: Intact Restrictions Weight Bearing Restrictions Per Provider Order: No   Therapy/Group: Individual Therapy  Artemus Biles, OTR/L, MSOT  02/01/2024, 6:15 AM

## 2024-02-01 NOTE — Progress Notes (Signed)
 Occupational Therapy Session Note  Patient Details  Name: Jackie Reed MRN: [id number] Date of Birth: [birth year]  Today's Date: 02/01/2024 OT Individual Time: 4098-1191 OT Individual Time Calculation (min): 27 min    Short Term Goals: Week 1:  OT Short Term Goal 1 (Week 1): See LTG's due to ELOS   Skilled Therapeutic Interventions/Progress Updates:    Pt up in wheelchair at time of session, no pain. ADL needs met. Transported to/from gym for time management. Focused on NMR for LUE using light and medium weighted balls with pt using only L hand for picking up, placing, crossing midline, aiming for objects, etc. With mild ataxia noted. Pt ed as well on tasks to do in room for further NMR. Back in room set up call bell in reach all needs met.   Therapy Documentation Precautions:  Precautions Precautions: Fall Recall of Precautions/Restrictions: Intact Restrictions Weight Bearing Restrictions Per Provider Order: No    Therapy/Group: Individual Therapy  Doroteo Gasmen 02/01/2024, 7:28 AM

## 2024-02-01 NOTE — Progress Notes (Signed)
 PROGRESS NOTE   Subjective/Complaints: Still having blood in urine and suprapubic pain   ROS: +hematuria, no sweats/chills reported   Objective:   DG Abd 1 View Result Date: 01/31/2024 CLINICAL DATA:  Abdominal pain EXAM: ABDOMEN - 1 VIEW COMPARISON:  None Available. FINDINGS: A moderate to large volume of stool material is present throughout the colon. Air is present within the rectum. IMPRESSION: 1. A moderate to large volume of stool material is present in the colon. Electronically Signed   By: Reagan Camera M.D.   On: 01/31/2024 10:04   Recent Labs    01/31/24 0518  WBC 6.2  HGB 11.9*  HCT 38.4  PLT 412*   No results for input(s): "NA", "K", "CL", "CO2", "GLUCOSE", "BUN", "CREATININE", "CALCIUM " in the last 72 hours.   Intake/Output Summary (Last 24 hours) at 02/01/2024 0748 Last data filed at 01/31/2024 1304 Gross per 24 hour  Intake 480 ml  Output --  Net 480 ml         Physical Exam: Vital Signs Blood pressure 122/75, pulse 82, temperature 98.7 F (37.1 C), temperature source Oral, resp. rate 18, height 5\' 4"  (1.626 m), SpO2 99%.   General: No acute distress Mood and affect are appropriate Heart: Regular rate and rhythm no rubs murmurs or extra sounds Lungs: Clear to auscultation, breathing unlabored, no rales or wheezes Abdomen: Positive bowel sounds, soft nontender to palpation, nondistended Extremities: No clubbing, cyanosis, or edema Skin: No evidence of breakdown, no evidence of rash Neurologic: Cranial nerves II through XII intact MMT: 5/5 strength right side 4/5 strength left side Sensory exam normal sensation to light touch and proprioception in bilateral upper and lower extremities Cerebellar exam intention tremor with L FNF  Musculoskeletal: no pain with AAROM but limited exam. No joint swelling   Assessment/Plan: 1. Functional deficits which require 3+ hours per day of interdisciplinary  therapy in a comprehensive inpatient rehab setting. Physiatrist is providing close team supervision and 24 hour management of active medical problems listed below. Physiatrist and rehab team continue to assess barriers to discharge/monitor patient progress toward functional and medical goals  Care Tool:  Bathing    Body parts bathed by patient: Left arm, Chest, Abdomen, Front perineal area, Right upper leg, Left upper leg, Face, Right arm, Buttocks, Left lower leg, Right lower leg   Body parts bathed by helper: Buttocks, Right arm, Right lower leg, Left lower leg     Bathing assist Assist Level: Supervision/Verbal cueing     Upper Body Dressing/Undressing Upper body dressing   What is the patient wearing?: Pull over shirt    Upper body assist Assist Level: Set up assist    Lower Body Dressing/Undressing Lower body dressing      What is the patient wearing?: Pants, Underwear/pull up     Lower body assist Assist for lower body dressing: Contact Guard/Touching assist     Toileting Toileting    Toileting assist Assist for toileting: Contact Guard/Touching assist     Transfers Chair/bed transfer  Transfers assist  Chair/bed transfer activity did not occur: Safety/medical concerns  Chair/bed transfer assist level: Contact Guard/Touching assist     Locomotion Ambulation   Ambulation  assist      Assist level: Minimal Assistance - Patient > 75% Assistive device: Walker-rolling Max distance: 60   Walk 10 feet activity   Assist     Assist level: Minimal Assistance - Patient > 75% Assistive device: Walker-rolling   Walk 50 feet activity   Assist Walk 50 feet with 2 turns activity did not occur: Safety/medical concerns  Assist level: Minimal Assistance - Patient > 75% Assistive device: Walker-rolling    Walk 150 feet activity   Assist Walk 150 feet activity did not occur: Safety/medical concerns         Walk 10 feet on uneven surface   activity   Assist Walk 10 feet on uneven surfaces activity did not occur: Safety/medical concerns         Wheelchair     Assist Is the patient using a wheelchair?: Yes Type of Wheelchair: Manual    Wheelchair assist level: Dependent - Patient 0%      Wheelchair 50 feet with 2 turns activity    Assist        Assist Level: Dependent - Patient 0%   Wheelchair 150 feet activity     Assist      Assist Level: Dependent - Patient 0%   Blood pressure 122/75, pulse 82, temperature 98.7 F (37.1 C), temperature source Oral, resp. rate 18, height 5\' 4"  (1.626 m), SpO2 99%.  Medical Problem List and Plan: 1. Functional deficits secondary to left hemiparesis secondary to hypertensive encephalopathy versus conversion disorder             -patient may shower             -ELOS/Goals: 7 to 10 days, supervision PT/OT, mod I SLP             - Grounds pass ordered  -IPOC completed  2.  Antithrombotics: -DVT/anticoagulation:  Pharmaceutical: Lovenox              -antiplatelet therapy: asa  3. Hematuria: UA/UC ordered. KUB ordered and shows moderate stool burden. OB/GYN consulted. Bactrim  started given positive UA, f/u UC, ? If Cx ordered no result   4. Constipation: asked nursing to check with patient if she would like to try milk of magnesia  5. Neuropsych/cognition: This patient is capable of making decisions on her own behalf.  6. Skin/Wound Care: Routine pressure relief measures.   7. Fluids/Electrolytes/Nutrition: Monitor I/O. Check CMET in am  8. HTN: Monitor BP TID--continue amlodipine  and HCTX Vitals:   02/01/24 0438 02/01/24 0734  BP: 122/75   Pulse: 82   Resp: 18   Temp: 98.7 F (37.1 C)   SpO2: 100% 99%    9. Sarcoidosis/Dyspnea: On breo with improvement in cough. Follwed by Dr. Alver Jobs.  10. Latent TB: Continue INH and pyridoxine  daily. Educate on importance of compliance.  11. Chronic Lumbar facet syndrome/Lumbar radiculopathy: Managed  with local measures and s/p median branch block 07/2023 per [facility]               --Voltaren  gel added.              - Reviewed chart; similar presentation to her pain management presentations since 2020.  Has failed cyclobenzaprine  (Flexeril ), gabapentin  (Neurontin , Gralise ), Reports mild benefit with use of Diclofenac  but not has not used recently.              - Lumbar MRI 09/21/23:  1. Mild anterolisthesis of L4-L5 with possible bilateral pars defects at this  level.  2. Multilevel lumbar spondylosis, most pronounced at the L4-L5 and L5-S1 levels with marked facet arthropathy. There is mild spinal canal stenosis and subarticular recess narrowing at L4-L5 with disc material likely contacting the bilateral descending L5 nerve roots as described.  3. No substantial foraminal stenosis at any of the lumbar levels.                - Add baclofen  10 mg 3 times daily as needed for muscle spasms   12. Hypokalemia: K-3.1 at admission. Is on hydrochlorothiazide .  --Will add  daily supplement for now.     Latest Ref Rng & Units 01/29/2024    4:55 AM 01/28/2024    7:02 PM 01/22/2024   12:28 PM  BMP  Glucose 70 - 99 mg/dL 098  119    BUN 6 - 20 mg/dL 22  26    Creatinine 1.47 - 1.00 mg/dL 8.29  5.62  1.30   Sodium 135 - 145 mmol/L 134  138    Potassium 3.5 - 5.1 mmol/L 4.2  3.6    Chloride 98 - 111 mmol/L 101  104    CO2 22 - 32 mmol/L 26  22    Calcium  8.9 - 10.3 mg/dL 9.0  86.5      13. Class 2 obesity: BMI 37.7. Educated on importance of weight loss and mobility to help promote overall health.    14.  Constipation FOS on KUB, will give sorbitol today , may need Fleets or dulc supp as well      LOS: 4 days A FACE TO FACE EVALUATION WAS PERFORMED  Jackie Reed 02/01/2024, 7:48 AM

## 2024-02-01 NOTE — Progress Notes (Signed)
 Physical Therapy Session Note  Patient Details  Name: Jackie Reed MRN: [id number] Date of Birth: 07-05-1966  Today's Date: 02/01/2024 PT Individual Time: 0347-4259 PT Individual Time Calculation (min): 46 min   Short Term Goals: Week 1:  PT Short Term Goal 1 (Week 1): Pt will ambulate with foot clearance for short distances PT Short Term Goal 2 (Week 1): pt will navigate 6" stairs with 1 rail and assist PT Short Term Goal 3 (Week 1): Pt will perform STS with supervision Week 2:     Skilled Therapeutic Interventions/Progress Updates:      Therapy Documentation Precautions:  Precautions Precautions: Fall Recall of Precautions/Restrictions: Intact Restrictions Weight Bearing Restrictions Per Provider Order: No General:   Vital Signs:   Pain:   Mobility:   Locomotion :    Trunk/Postural Assessment :    Balance:   Exercises:   Other Treatments:      Therapy/Group: Individual Therapy  Donne Gage PT, DPT, CSRS 02/01/2024, 6:31 PM

## 2024-02-02 DIAGNOSIS — I674 Hypertensive encephalopathy: Secondary | ICD-10-CM | POA: Diagnosis not present

## 2024-02-02 MED ORDER — SALINE SPRAY 0.65 % NA SOLN: 1.0000 | NASAL | Status: DC | PRN

## 2024-02-02 NOTE — Progress Notes (Signed)
 Occupational Therapy Session Note  Patient Details  Name: Jackie Reed MRN: [id number] Date of Birth: 1966-07-05  Today's Date: 02/02/2024 OT Individual Time: 1132-1201 OT Individual Time Calculation (min): 29 min    Short Term Goals: Week 1:  OT Short Term Goal 1 (Week 1): See LTG's due to ELOS  Skilled Therapeutic Interventions/Progress Updates:    Patient rececived seated in recliner.  Made plan to shower in longer afternoon session.  Reviewed home situation - tri-level.  Needs to be able to go up/down stairs.  Patient feels sheneeds more practice on stairs.  Reviewed potential discharge date and remaining focus for therapy.  Worked on LUE coordination - handwriting, in-hand manipulation and hand/wrist/forearm coordination in room.  Patient is showing significant improvement in these areas and is showing improved functional use of dominant LUE.  Left a paper tablet in room for patient to continue to practice her handwriting.    Therapy Documentation Precautions:  Precautions Precautions: Fall Recall of Precautions/Restrictions: Intact Restrictions Weight Bearing Restrictions Per Provider Order: No  Pain: Pain Assessment Pain Scale: 0-10 Pain Score: 0-No pain     Therapy/Group: Individual Therapy  Mohsin Crum M 02/02/2024, 12:12 PM

## 2024-02-02 NOTE — Progress Notes (Addendum)
 Physical Therapy Session Note  Patient Details  Name: Jackie Reed MRN: [id number] Date of Birth: 05-04-1966  Today's Date: 02/02/2024 PT Individual Time: 0857-1000 and 1446-1530 PT Individual Time Calculation (min): 63 min and 44 min.  Short Term Goals: Week 1:  PT Short Term Goal 1 (Week 1): Pt will ambulate with foot clearance for short distances PT Short Term Goal 2 (Week 1): pt will navigate 6 stairs with 1 rail and assist PT Short Term Goal 3 (Week 1): Pt will perform STS with supervision  Skilled Therapeutic Interventions/Progress Updates:   First session:  Pt presents in BR and then agreeable to therapy.  Pt transfers sit to stand w/ CGA from toilet.  Pt amb to sink for handwashing.  Pt wheeled to small gym for time conservation.  Pt amb w/ RW and 4# AW to L for improved proprioception for advancing LLE.  Pt negotiated 8 steps w/ B rails and 4# aw w/ CGA X 4 steps and then removed weight 2/2 fatigue x 4 steps but pt reported decrease in L foot clearance.  Pt amb x 100' w/ RW and CGA, cues for L hip flexion and kick forward.  Pt amb x 40' x 2 w/o AD and min/CGA and appeared improved weight shift and advancement of LLE.  Pt returned to room and transferred to recliner and remained sitting w/ all needs in reach.  Missed 12 min 2/2 toileting.  Second session:  Pt presents sitting in w/c and agreeable to therapy.  Pt doffed socks and donned shoes w/ set-up.  Pt wheeled to elevators and downstairs to outdoors near atrium.  Pt amb w/ RW and CGA/CS x 65'  and 75' w/o cue needed for LLE advancement.  Pt transfers from all surfaces w/ close supervision/ mod I.  Pt amb x 100' w/o AD on uneven surfaces w/o LOB, LLE advancement w/o scuffing, cues for arm swing and advancement improved w/o delay.  Pt amb into room w/o AD and CGA to recliner.  All needs in reach.     Therapy Documentation Precautions:  Precautions Precautions: Fall Recall of Precautions/Restrictions: Intact Restrictions Weight  Bearing Restrictions Per Provider Order: No General: PT Amount of Missed Time (min): 12 Minutes PT Missed Treatment Reason: Toileting Vital Signs: Therapy Vitals Pulse Rate: 88 BP: 131/78 Patient Position (if appropriate): Sitting Pain:0/10 b sessions. Pain Assessment Pain Scale: 0-10 Pain Score: 0-No pain    Therapy/Group: Individual Therapy  Sima Lindenberger P Caylin Nass 02/02/2024, 10:07 AM

## 2024-02-02 NOTE — Progress Notes (Signed)
 Patient ID: Jackie Reed, female   DOB: February 16, 1966, 58 y.o.   MRN: [id number]  Met with pt to give team conference update regarding goals of supervision level and target discharge date of 6/17. Discussed equipment and OP follow up. She feels she is doing well and will be ready by then offered for her to have husband or sister in-law to come in for therapies if feels needs too. She will talk with both and get back with this worker to let know. She can go to OP on [address] which is close to her home. Awaiting equipment recommendations for pt from therapists. Continue to work on discharge needs.

## 2024-02-02 NOTE — Progress Notes (Signed)
 Occupational Therapy Session Note  Patient Details  Name: Jackie Reed MRN: [id number] Date of Birth: 1966-07-09  Today's Date: 02/02/2024 OT Individual Time: 6644-0347 OT Individual Time Calculation (min): 72 min    Short Term Goals: Week 1:  OT Short Term Goal 1 (Week 1): See LTG's due to ELOS  Skilled Therapeutic Interventions/Progress Updates:   Patient received seated in recliner.  Eager for shower.  Walked to bathroom with CGA to close supervision using RW.  Cueing patient to remain upright and to not lift her left leg but to bring it forward with heel striking first.  Also cueing patient to reduce pressure down through arms.  Patient showered seated with distant supervision following set up.  Patient shows no unsafe behaviors.  Patient dressed herself while seated with increased time.  Patient walked to sink in room to complete oral care.  Patient walked to ADL apartment to address furniture transfers - couch, recliner, bed.  Patient does best with actual practice as she fears newer situations.  Patient has tub/shower at home, but has sliding glass doors.  Practiced both seated and standing transfer into tub onto shower seat without back.  Walked to gym to address high bed trasnfers.  Worked on sitting then scooting forward and back with emphasis on big weight shift off the part that is moving.  Finally worked on stairs - patient is in tri-level home.  Initially she worked with step to pattern, then step through pattern with use of both rails.  Patient rode back to room in wheelchair, stating she is very pleased with her progress today.      Therapy Documentation Precautions:  Precautions Precautions: Fall Recall of Precautions/Restrictions: Intact Restrictions Weight Bearing Restrictions Per Provider Order: No   Pain:  Denies pain       Therapy/Group: Individual Therapy  Kwadwo Taras M 02/02/2024, 2:16 PM

## 2024-02-02 NOTE — Progress Notes (Signed)
 PROGRESS NOTE   Subjective/Complaints: Still having blood in urine but no clots passed , no pain with urination , no fevers No issues PTA  ROS: +hematuria, no sweats/chills reported   Objective:   DG Abd 1 View Result Date: 01/31/2024 CLINICAL DATA:  Abdominal pain EXAM: ABDOMEN - 1 VIEW COMPARISON:  None Available. FINDINGS: A moderate to large volume of stool material is present throughout the colon. Air is present within the rectum. IMPRESSION: 1. A moderate to large volume of stool material is present in the colon. Electronically Signed   By: Reagan Camera M.D.   On: 01/31/2024 10:04   Recent Labs    01/31/24 0518  WBC 6.2  HGB 11.9*  HCT 38.4  PLT 412*   No results for input(s): NA, K, CL, CO2, GLUCOSE, BUN, CREATININE, CALCIUM  in the last 72 hours.   Intake/Output Summary (Last 24 hours) at 02/02/2024 0723 Last data filed at 02/01/2024 1842 Gross per 24 hour  Intake 716 ml  Output --  Net 716 ml         Physical Exam: Vital Signs Blood pressure 115/69, pulse 69, temperature 98.6 F (37 C), temperature source Oral, resp. rate 16, height 5' 4 (1.626 m), SpO2 94%.   General: No acute distress Mood and affect are appropriate Heart: Regular rate and rhythm no rubs murmurs or extra sounds Lungs: Clear to auscultation, breathing unlabored, no rales or wheezes Abdomen: Positive bowel sounds, soft nontender to palpation, nondistended Extremities: No clubbing, cyanosis, or edema Skin: No evidence of breakdown, no evidence of rash Neurologic: Cranial nerves II through XII intact MMT: 5/5 strength right side 4/5 strength left side Sensory exam normal sensation to light touch and proprioception in bilateral upper and lower extremities Cerebellar exam intention tremor with L FNF  Musculoskeletal: no pain with AAROM but limited exam. No joint swelling   Assessment/Plan: 1. Functional deficits which  require 3+ hours per day of interdisciplinary therapy in a comprehensive inpatient rehab setting. Physiatrist is providing close team supervision and 24 hour management of active medical problems listed below. Physiatrist and rehab team continue to assess barriers to discharge/monitor patient progress toward functional and medical goals  Care Tool:  Bathing    Body parts bathed by patient: Left arm, Chest, Abdomen, Front perineal area, Right upper leg, Left upper leg, Face, Right arm, Buttocks, Left lower leg, Right lower leg   Body parts bathed by helper: Buttocks, Right arm, Right lower leg, Left lower leg     Bathing assist Assist Level: Supervision/Verbal cueing     Upper Body Dressing/Undressing Upper body dressing   What is the patient wearing?: Pull over shirt    Upper body assist Assist Level: Set up assist    Lower Body Dressing/Undressing Lower body dressing      What is the patient wearing?: Pants, Underwear/pull up     Lower body assist Assist for lower body dressing: Contact Guard/Touching assist     Toileting Toileting    Toileting assist Assist for toileting: Contact Guard/Touching assist     Transfers Chair/bed transfer  Transfers assist  Chair/bed transfer activity did not occur: Safety/medical concerns  Chair/bed transfer assist level: Contact  Guard/Touching assist     Locomotion Ambulation   Ambulation assist      Assist level: Minimal Assistance - Patient > 75% Assistive device: Walker-rolling Max distance: 60   Walk 10 feet activity   Assist     Assist level: Minimal Assistance - Patient > 75% Assistive device: Walker-rolling   Walk 50 feet activity   Assist Walk 50 feet with 2 turns activity did not occur: Safety/medical concerns  Assist level: Minimal Assistance - Patient > 75% Assistive device: Walker-rolling    Walk 150 feet activity   Assist Walk 150 feet activity did not occur: Safety/medical concerns          Walk 10 feet on uneven surface  activity   Assist Walk 10 feet on uneven surfaces activity did not occur: Safety/medical concerns         Wheelchair     Assist Is the patient using a wheelchair?: Yes Type of Wheelchair: Manual    Wheelchair assist level: Dependent - Patient 0%      Wheelchair 50 feet with 2 turns activity    Assist        Assist Level: Dependent - Patient 0%   Wheelchair 150 feet activity     Assist      Assist Level: Dependent - Patient 0%   Blood pressure 115/69, pulse 69, temperature 98.6 F (37 C), temperature source Oral, resp. rate 16, height 5' 4 (1.626 m), SpO2 94%.  Medical Problem List and Plan: 1. Functional deficits secondary to left hemiparesis secondary to hypertensive encephalopathy versus conversion disorder             -patient may shower             -ELOS/Goals: 7 to 10 days, supervision PT/OT, mod I SLP             - Grounds pass ordered  -IPOC completed  2.  Antithrombotics: -DVT/anticoagulation:  Pharmaceutical: Lovenox - may d/c amb 16' CGA with PT             -antiplatelet therapy: asa  3. Hematuria: UA ordered. Bactrim  started given positive UA, will stop enoxaparin  due to improved ambulation which should help with hematuriarecheck CBC in am  Will do total of 6 doses and would recheck UA C and S 48h after last dose if pt still has hematuria 4. Constipation: asked nursing to check with patient if she would like to try milk of magnesia  5. Neuropsych/cognition: This patient is capable of making decisions on her own behalf.  6. Skin/Wound Care: Routine pressure relief measures.   7. Fluids/Electrolytes/Nutrition: Monitor I/O. Check CMET in am  8. HTN: Monitor BP TID--continue amlodipine  and HCTX Vitals:   02/01/24 1952 02/02/24 0504  BP: 124/80 115/69  Pulse: 81 69  Resp: 18 16  Temp: 98.4 F (36.9 C) 98.6 F (37 C)  SpO2: 100% 94%    9. Sarcoidosis/Dyspnea: On breo with improvement in cough.  Follwed by Dr. Alver Jobs.  10. Latent TB: Continue INH and pyridoxine  daily. Educate on importance of compliance.  11. Chronic Lumbar facet syndrome/Lumbar radiculopathy: Managed with local measures and s/p median branch block 07/2023 per [facility]               --Voltaren  gel added.              - Reviewed chart; similar presentation to her pain management presentations since 2020.  Has failed cyclobenzaprine  (Flexeril ), gabapentin  (Neurontin , Gralise ), Reports mild benefit  with use of Diclofenac  but not has not used recently.              - Lumbar MRI 09/21/23:  1. Mild anterolisthesis of L4-L5 with possible bilateral pars defects at this level.  2. Multilevel lumbar spondylosis, most pronounced at the L4-L5 and L5-S1 levels with marked facet arthropathy. There is mild spinal canal stenosis and subarticular recess narrowing at L4-L5 with disc material likely contacting the bilateral descending L5 nerve roots as described.  3. No substantial foraminal stenosis at any of the lumbar levels.                - Add baclofen  10 mg 3 times daily as needed for muscle spasms   12. Hypokalemia: K-3.1 at admission. Is on hydrochlorothiazide .  --Will add  daily supplement for now.     Latest Ref Rng & Units 01/29/2024    4:55 AM 01/28/2024    7:02 PM 01/22/2024   12:28 PM  BMP  Glucose 70 - 99 mg/dL 119  147    BUN 6 - 20 mg/dL 22  26    Creatinine 8.29 - 1.00 mg/dL 5.62  1.30  8.65   Sodium 135 - 145 mmol/L 134  138    Potassium 3.5 - 5.1 mmol/L 4.2  3.6    Chloride 98 - 111 mmol/L 101  104    CO2 22 - 32 mmol/L 26  22    Calcium  8.9 - 10.3 mg/dL 9.0  78.4      13. Class 2 obesity: BMI 37.7. Educated on importance of weight loss and mobility to help promote overall health.    14.  Constipation FOS on KUB, will give sorbitol today , may need Fleets or dulc supp as well     15.  Microcytic anemia check iron studies LOS: 5 days A FACE TO FACE EVALUATION WAS PERFORMED  Jackie Reed 02/02/2024, 7:23 AM

## 2024-02-03 ENCOUNTER — Inpatient Hospital Stay (HOSPITAL_COMMUNITY)

## 2024-02-03 DIAGNOSIS — G9340 Encephalopathy, unspecified: Secondary | ICD-10-CM | POA: Diagnosis not present

## 2024-02-03 LAB — BASIC METABOLIC PANEL WITH GFR
Anion gap: 12 (ref 5–15)
BUN: 32 mg/dL — ABNORMAL HIGH (ref 6–20)
CO2: 22 mmol/L (ref 22–32)
Calcium: 9.6 mg/dL (ref 8.9–10.3)
Chloride: 103 mmol/L (ref 98–111)
Creatinine, Ser: 1.17 mg/dL — ABNORMAL HIGH (ref 0.44–1.00)
GFR, Estimated: 54 mL/min — ABNORMAL LOW (ref >60)
Glucose, Bld: 122 mg/dL — ABNORMAL HIGH (ref 70–99)
Potassium: 4.2 mmol/L (ref 3.5–5.1)
Sodium: 137 mmol/L (ref 135–145)

## 2024-02-03 LAB — IRON AND TIBC
Iron: 87 ug/dL (ref 28–170)
Saturation Ratios: 22 % (ref 10.4–31.8)
TIBC: 403 ug/dL (ref 250–450)
UIBC: 316 ug/dL

## 2024-02-03 MED ORDER — SENNOSIDES-DOCUSATE SODIUM 8.6-50 MG PO TABS
2.0000 | ORAL_TABLET | Freq: Two times a day (BID) | ORAL | Status: DC
  Administered 2024-02-03 – 2024-02-07 (×8): 2 via ORAL
  Filled 2024-02-03 (×10): qty 2

## 2024-02-03 MED ORDER — SMOG ENEMA
960.0000 mL | Freq: Once | RECTAL | Status: AC
  Administered 2024-02-03: 960 mL via RECTAL
  Filled 2024-02-03: qty 960

## 2024-02-03 MED ORDER — IOHEXOL 350 MG/ML SOLN
75.0000 mL | Freq: Once | INTRAVENOUS | Status: AC | PRN
  Administered 2024-02-03: 75 mL via INTRAVENOUS

## 2024-02-03 MED ORDER — OXYCODONE HCL 5 MG PO TABS
5.0000 mg | ORAL_TABLET | ORAL | Status: DC | PRN
  Administered 2024-02-03 (×2): 5 mg via ORAL
  Filled 2024-02-03 (×4): qty 1

## 2024-02-03 MED ORDER — SENNOSIDES-DOCUSATE SODIUM 8.6-50 MG PO TABS: 2.0000 | ORAL_TABLET | Freq: Every day | ORAL | Status: DC

## 2024-02-03 MED ORDER — SODIUM CHLORIDE 0.9% FLUSH: 10.0000 mL | INTRAVENOUS | Status: DC | PRN

## 2024-02-03 MED ORDER — LIDOCAINE 5 % EX PTCH
1.0000 | MEDICATED_PATCH | CUTANEOUS | Status: DC
  Administered 2024-02-03 – 2024-02-08 (×5): 1 via TRANSDERMAL
  Filled 2024-02-03 (×6): qty 1

## 2024-02-03 MED ORDER — SODIUM CHLORIDE 0.9% FLUSH
10.0000 mL | Freq: Two times a day (BID) | INTRAVENOUS | Status: DC
  Administered 2024-02-04 – 2024-02-08 (×8): 10 mL

## 2024-02-03 NOTE — Progress Notes (Signed)
 Patient ID: Jackie Reed, female   DOB: Jan 24, 1966, 58 y.o.   MRN: [id number]  ordered rolling walker via [employer] for delivery to pt;s room.

## 2024-02-03 NOTE — Progress Notes (Signed)
 Physical Therapy Session Note  Patient Details  Name: Jackie Reed MRN: [id number] Date of Birth: July 29, 1966  Today's Date: 02/03/2024 PT Individual Time: 1048-1200 PT Individual Time Calculation (min): 72 min   Short Term Goals: Week 1:  PT Short Term Goal 1 (Week 1): Pt will ambulate with foot clearance for short distances PT Short Term Goal 2 (Week 1): pt will navigate 6 stairs with 1 rail and assist PT Short Term Goal 3 (Week 1): Pt will perform STS with supervision  Skilled Therapeutic Interventions/Progress Updates: Pt presents sitting in recliner and agreeable to therapy.  Pt has no c/o pain after this morning w/ extreme pain.  Pt received mm relaxer and Lidoderm  patch to R LB.  Pt wheeled to outdoors.  Pt transfers sit to stand w/ mod I all surfaces.  Pt amb w/ RW and supervision on uneven surfaces and negotiating obstacles w advancement of LLE w/ toe clearance and no change in cadence.  Pt returned to main gym.  Pt negotiated floor ladder w/ 1 LOB w/ R foot decrease in BOS.  Pt performed sidestepping, marching and backwards stepping using green line for visual cues, maintaining BOS.  Pt cued for increased gait speed for improved balance.  Pt negotiated 12 steps w/ 1 rail only and CGA, R side ascending and L side descending, reciprocal gait pattern.  Pt cued to tighten up L knee when stepping down w/ RLE.  Pt amb x 95' x 2, 150' w/o AD and CGA/C/S around gym loop and then back to room to recliner.  Pt remained sitting w/ all needs in reach.     Therapy Documentation Precautions:  Precautions Precautions: Fall Recall of Precautions/Restrictions: Intact Restrictions Weight Bearing Restrictions Per Provider Order: No General:   Vital Signs:   Pain:0/10 Pain Assessment Pain Scale: 0-10 Pain Score: 3  Faces Pain Scale: No hurt Pain Type: Acute pain Pain Location: Abdomen Pain Orientation: Right Pain Radiating Towards: Flank Pain Intervention(s): Repositioned;Emotional  support     Therapy/Group: Individual Therapy  Zakariye Nee P Jocelyn Nold 02/03/2024, 12:55 PM

## 2024-02-03 NOTE — Patient Care Conference (Signed)
 Inpatient RehabilitationTeam Conference and Plan of Care Update Date: 02/02/2024   Time: 11:13 AM    Patient Name: Jackie Reed      Medical Record Number: [id number]  Date of Birth: [birth year] Sex: Female         Room/Bed: 4M02C/4M02C-01 Payor Info: Payor: Raina Bunting / Plan: [facility] / Product Type: *No Product type* /    Admit Date/Time:  01/28/2024  5:44 PM  Primary Diagnosis:  Encephalopathy  Hospital Problems: Principal Problem:   Encephalopathy Active Problems:   Encephalopathy, hypertensive    Expected Discharge Date: Expected Discharge Date: 02/08/24  Team Members Present: Physician leading conference: Dr. Janeece Mechanic Social Worker Present: Adrianna Albee, LCSW Nurse Present: Forrestine Ike, RN PT Present: Nena Bank, PT OT Present: Celestino Cole, OT SLP Present: Dorla Gartner, SLP PPS Coordinator present : Jestine Moron, SLP     Current Status/Progress Goal Weekly Team Focus  Bowel/Bladder   Continent of bowel and bladder. C/o constipation and given laxative, awaiting results. On antibiotoics for UTI.   Remain continent throughout remainder of stay   Toilet q 2 hours while awake and PRN    Swallow/Nutrition/ Hydration               ADL's   Min assist BADL   Supervision/ set up   manage ataxia, increase independence with BADL, activity tolerance, Discharge planning    Mobility   Bed mobility supervision, transfers with RW CGA/close supervision, gait 72ft with RW CGA/close supervision   supervision  barriers: weakness, deconditioning, decreased balance/coordination    Communication                Safety/Cognition/ Behavioral Observations               Pain   C/o lower abdominal pain, reports some relief with Tylenol  and Mylanta.   Pain level to remain <2 on pain scale.   Assess for pain q shift and PRN.    Skin   Scattered bruising abdomen from Lovenox .   Pt to remain free from s/sx of infection.  Assess skin q shift and PRN.       Discharge Planning:  HOme with husband who works but sister in-law will be there with here and provide supervision level. Barriers split level home multiple steps. Awaitng teams recommendations   Team Discussion: Patient post hypertensive event with encephalopathy, with ataxia. Activity tolerance limited by abd. pain.  Patient on target to meet rehab goals: yes, currently needs mod assist for ADLs. Able to transfer with supervision - CGA assist. Able to ambulate up  to 55' using a RW with close supervision. Goals for discharge set for supervision overall.  *See Care Plan and progress notes for long and short-term goals.   Revisions to Treatment Plan:  Stop Lovenox  per MD Check UA   Teaching Needs: Safety, medication, transfers, toileting, etc.   Current Barriers to Discharge: Decreased caregiver support and Home enviroment access/layout  Possible Resolutions to Barriers: Family education OP follow up services     Medical Summary Current Status: Left upper extremity strength is improving, has had hematuria with positive UA being treated with Septra .     Possible Resolutions to [employer] Focus: Complete antibiotic treatment, monitor CBC, reculture after antibiotic treatment completed   Continued Need for Acute Rehabilitation Level of Care: The patient requires daily medical management by a physician with specialized training in physical medicine and rehabilitation for the following reasons: Direction of a multidisciplinary physical rehabilitation program to maximize functional  independence : Yes Medical management of patient stability for increased activity during participation in an intensive rehabilitation regime.: Yes Analysis of laboratory values and/or radiology reports with any subsequent need for medication adjustment and/or medical intervention. : Yes   I attest that I was present, lead the team conference, and concur with the assessment and plan of the  team.   Forrestine Ike B 02/03/2024, 8:12 AM

## 2024-02-03 NOTE — Progress Notes (Signed)
 Given enema pt tolerated well and found some relief with large BM.

## 2024-02-03 NOTE — Progress Notes (Signed)
 Occupational Therapy Session Note  Patient Details  Name: LINDORA ALVIAR MRN: [id number] Date of Birth: 1966-04-14  Today's Date: 02/03/2024 OT Individual Time: 1300-1347 OT Individual Time Calculation (min): 47 min    Short Term Goals: Week 1:  OT Short Term Goal 1 (Week 1): See LTG's due to ELOS  Skilled Therapeutic Interventions/Progress Updates:    Patient received seated in recliner much brighter than this morning.  Patient declined shower stating she washed up earlier.  Patient agreeable to walk to gym - working on walking without walker.  Worked on [employer] fundraiser of steps - 11 steps - step thru pattern with one rail with seated rest break before descending stairs.  Worked on floor recovery with min assist.  Patient also worked on gross motor coordination - tossing, catching a ball seated, then standing, then walking.   Session ended early due to start of back pain.  Spoke with nurse to request tylenol  for pain, and left patient up in recliner in room.    Therapy Documentation Precautions:  Precautions Precautions: Fall Recall of Precautions/Restrictions: Intact Restrictions Weight Bearing Restrictions Per Provider Order: No   Pain: Pain Assessment Pain Scale: 0-10 Pain Score: 3  Faces Pain Scale: No hurt Pain Type: Acute pain Pain Location: Abdomen Pain Orientation: Right Pain Radiating Towards: Flank Pain Intervention(s): Repositioned;Emotional support     Therapy/Group: Individual Therapy  Fran Mcree M 02/03/2024, 1:48 PM

## 2024-02-03 NOTE — Progress Notes (Signed)
 PROGRESS NOTE   Subjective/Complaints: Complained of abdominal pain this morning but is feeling better now. KUB was ordered that shows constipation, enema ordered for after 2pm  ROS: +hematuria, no sweats/chills reported, +constipation   Objective:   US  RENAL Result Date: 02/03/2024 CLINICAL DATA:  144615 Pain 144615 EXAM: RENAL / URINARY TRACT ULTRASOUND COMPLETE COMPARISON:  None Available. FINDINGS: Right Kidney: Renal measurements: 10.3 x 4.1 x 5.8 cm = volume: 127 mL. Normal echogenicity. No mass. No hydronephrosis or nephrolithiasis. Left Kidney: Renal measurements: 10.9 x 5.7 x 5.1 cm = volume: 166 mL. Normal echogenicity. No mass. No hydronephrosis or nephrolithiasis. Bladder: Appears normal for degree of bladder distention. Other: None. IMPRESSION: No hydronephrosis or nephrolithiasis. Electronically Signed   By: Rance Burrows M.D.   On: 02/03/2024 09:04   DG Abd 1 View Result Date: 02/03/2024 CLINICAL DATA:  Abdominal pain EXAM: ABDOMEN - 1 VIEW COMPARISON:  None Available. FINDINGS: Abundant residual fecal material throughout the colon without obstruction IMPRESSION: Abundant residual fecal material throughout the colon without obstruction. Electronically Signed   By: Fredrich Jefferson M.D.   On: 02/03/2024 08:25   No results for input(s): WBC, HGB, HCT, PLT in the last 72 hours.  Recent Labs    02/03/24 0601  NA 137  K 4.2  CL 103  CO2 22  GLUCOSE 122*  BUN 32*  CREATININE 1.17*  CALCIUM  9.6     Intake/Output Summary (Last 24 hours) at 02/03/2024 1052 Last data filed at 02/02/2024 1812 Gross per 24 hour  Intake 480 ml  Output --  Net 480 ml         Physical Exam: Vital Signs Blood pressure 127/88, pulse 85, temperature 98.5 F (36.9 C), temperature source Oral, resp. rate 16, height 5' 4 (1.626 m), SpO2 99%.   General: No acute distress Mood and affect are appropriate Heart: Regular rate and  rhythm no rubs murmurs or extra sounds Lungs: Clear to auscultation, breathing unlabored, no rales or wheezes Abdomen: Positive bowel sounds, soft nontender to palpation, nondistended Extremities: No clubbing, cyanosis, or edema Skin: No evidence of breakdown, no evidence of rash Neurologic: Cranial nerves II through XII intact MMT:  5/5 strength right side 4/5 left side strength Sensory exam normal sensation to light touch and proprioception in bilateral upper and lower extremities Cerebellar exam intention tremor with L FNF  Musculoskeletal: no pain with AAROM but limited exam. No joint swelling   Assessment/Plan: 1. Functional deficits which require 3+ hours per day of interdisciplinary therapy in a comprehensive inpatient rehab setting. Physiatrist is providing close team supervision and 24 hour management of active medical problems listed below. Physiatrist and rehab team continue to assess barriers to discharge/monitor patient progress toward functional and medical goals  Care Tool:  Bathing    Body parts bathed by patient: Left arm, Chest, Abdomen, Front perineal area, Right upper leg, Left upper leg, Face, Right arm, Buttocks, Left lower leg, Right lower leg   Body parts bathed by helper: Buttocks, Right arm, Right lower leg, Left lower leg     Bathing assist Assist Level: Set up assist     Upper Body Dressing/Undressing Upper body dressing   What  is the patient wearing?: Bra, Pull over shirt    Upper body assist Assist Level: Set up assist    Lower Body Dressing/Undressing Lower body dressing      What is the patient wearing?: Underwear/pull up, Pants     Lower body assist Assist for lower body dressing: Set up assist     Toileting Toileting    Toileting assist Assist for toileting: Contact Guard/Touching assist     Transfers Chair/bed transfer  Transfers assist  Chair/bed transfer activity did not occur: Safety/medical concerns  Chair/bed transfer  assist level: Supervision/Verbal cueing     Locomotion Ambulation   Ambulation assist      Assist level: Contact Guard/Touching assist Assistive device: Walker-rolling Max distance: 100   Walk 10 feet activity   Assist     Assist level: Contact Guard/Touching assist Assistive device: Walker-rolling   Walk 50 feet activity   Assist Walk 50 feet with 2 turns activity did not occur: Safety/medical concerns  Assist level: Contact Guard/Touching assist Assistive device: Walker-rolling    Walk 150 feet activity   Assist Walk 150 feet activity did not occur: Safety/medical concerns         Walk 10 feet on uneven surface  activity   Assist Walk 10 feet on uneven surfaces activity did not occur: Safety/medical concerns   Assist level: Minimal Assistance - Patient > 75% Assistive device: Walker-rolling (and w/o AD)   Wheelchair     Assist Is the patient using a wheelchair?: Yes Type of Wheelchair: Manual    Wheelchair assist level: Dependent - Patient 0%      Wheelchair 50 feet with 2 turns activity    Assist        Assist Level: Dependent - Patient 0%   Wheelchair 150 feet activity     Assist      Assist Level: Dependent - Patient 0%   Blood pressure 127/88, pulse 85, temperature 98.5 F (36.9 C), temperature source Oral, resp. rate 16, height 5' 4 (1.626 m), SpO2 99%.  Medical Problem List and Plan: 1. Functional deficits secondary to left hemiparesis secondary to hypertensive encephalopathy versus conversion disorder             -patient may shower             -ELOS/Goals: 7 to 10 days, supervision PT/OT, mod I SLP             - Grounds pass ordered  -IPOC completed  Continue CIR  2.  Antithrombotics: -DVT/anticoagulation:  Pharmaceutical: Lovenox - may d/c amb 14' CGA with PT             -antiplatelet therapy: asa  3. Hematuria: UA ordered. Bactrim  started given positive UA, will stop enoxaparin  due to improved ambulation  which should help with hematuriarecheck CBC in am  Will do total of 6 doses and would recheck UA C and S 48h after last dose if pt still has hematuria  4. Constipation: asked nursing to check with patient if she would like to try milk of magnesia  5. Neuropsych/cognition: This patient is capable of making decisions on her own behalf.  6. Abdominal pain: Lidocaine  patch ordered  7. Fluids/Electrolytes/Nutrition: Monitor I/O. Check CMET in am  8. HTN: Monitor BP TID--continue amlodipine  and HCTX Vitals:   02/02/24 1953 02/03/24 0531  BP: 134/80 127/88  Pulse: 80 85  Resp: 18 16  Temp: 97.7 F (36.5 C) 98.5 F (36.9 C)  SpO2: 100% 99%    9.  Sarcoidosis/Dyspnea: On breo with improvement in cough. Follwed by Dr. Alver Jobs.  10. Latent TB: Continue INH and pyridoxine  daily. Educate on importance of compliance.  11. Chronic Lumbar facet syndrome/Lumbar radiculopathy: Managed with local measures and s/p median branch block 07/2023 per [facility]               --Voltaren  gel added.              - Reviewed chart; similar presentation to her pain management presentations since 2020.  Has failed cyclobenzaprine  (Flexeril ), gabapentin  (Neurontin , Gralise ), Reports mild benefit with use of Diclofenac  but not has not used recently.              - Lumbar MRI 09/21/23:  1. Mild anterolisthesis of L4-L5 with possible bilateral pars defects at this level.  2. Multilevel lumbar spondylosis, most pronounced at the L4-L5 and L5-S1 levels with marked facet arthropathy. There is mild spinal canal stenosis and subarticular recess narrowing at L4-L5 with disc material likely contacting the bilateral descending L5 nerve roots as described.  3. No substantial foraminal stenosis at any of the lumbar levels.                - Add baclofen  10 mg 3 times daily as needed for muscle spasms   12. Hypokalemia: K+ reviewed and has normalized     Latest Ref Rng & Units 02/03/2024    6:01 AM 01/29/2024     4:55 AM 01/28/2024    7:02 PM  BMP  Glucose 70 - 99 mg/dL 409  811  914   BUN 6 - 20 mg/dL 32  22  26   Creatinine 0.44 - 1.00 mg/dL 7.82  9.56  2.13   Sodium 135 - 145 mmol/L 137  134  138   Potassium 3.5 - 5.1 mmol/L 4.2  4.2  3.6   Chloride 98 - 111 mmol/L 103  101  104   CO2 22 - 32 mmol/L 22  26  22    Calcium  8.9 - 10.3 mg/dL 9.6  9.0  08.6     13. Class 2 obesity: BMI 37.7. Educated on importance of weight loss and mobility to help promote overall health.    14.  Constipation FOS on KUB, fleet enema ordered   15.  Microcytic anemia: iron studies reviewed and are stable LOS: 6 days A FACE TO FACE EVALUATION WAS PERFORMED  Keven Pel Angelic Schnelle 02/03/2024, 10:52 AM

## 2024-02-03 NOTE — Progress Notes (Signed)
 Occupational Therapy Session Note  Patient Details  Name: Jackie Reed MRN: [id number] Date of Birth: 10/18/1965  Today's Date: 02/03/2024 OT Individual Time: 7829-5621 OT Individual Time Calculation (min): 38 min    Short Term Goals: Week 1:  OT Short Term Goal 1 (Week 1): See LTG's due to ELOS  Skilled Therapeutic Interventions/Progress Updates:    Patient received seated in recliner with head in her hands crying and rocking.  Patient indicates pain in abdomen and right flank area as severe and Tylenol  not touching it.  Patient growing more agitated and difficult to console.  Helped patient back to bed, messaged team to make everyone aware of acute pain uncontrolled.  Assisted patient back to bed, placed her on her side in flexed position, secured moist heat packs to abdomen and right flank.  Assisted Radiology techs to position for abdomina xray.  Encouraged slower deeper breaths to help calm patient.  Patient left in care of nurse and PA at end of session.    Therapy Documentation Precautions:  Precautions Precautions: Fall Recall of Precautions/Restrictions: Intact Restrictions Weight Bearing Restrictions Per Provider Order: No  Pain: Pain Assessment Pain Scale: 0-10 Pain Score: 15-20/10 Pain Type: Acute pain Pain Location: Other (Comment) (Abdomen/flank) Pain Orientation: Right Pain Descriptors / Indicators: Sharp;Aching;Discomfort Pain Frequency: Intermittent Pain Intervention(s): Medication (See eMAR), moist heat, deep breathing     Therapy/Group: Individual Therapy  Veniamin Kincaid M 02/03/2024, 8:11 AM

## 2024-02-03 NOTE — Discharge Instructions (Addendum)
 Inpatient Rehab Discharge Instructions  Jackie Reed Discharge date and time: February 08, 2024 TIME   Activities/Precautions/ Functional Status: Activity: no lifting, driving, or strenuous exercise for until cleared by MD.  Diet: regular diet Wound Care: none needed Functional status:  ___ No restrictions     ___ Walk up steps independently ___ 24/7 supervision/assistance   ___ Walk up steps with assistance ___ Intermittent supervision/assistance  ___ Bathe/dress independently _X__ Walk with walker     ___ Bathe/dress with assistance ___ Walk Independently    ___ Shower independently ___ Walk with assistance    ___ Shower with assistance __X_ No alcohol     ___ Return to work/school ________  Special Instructions:  No driving until cleared by MD.    My questions have been answered and I understand these instructions. I will adhere to these goals and the provided educational materials after my discharge from the hospital.  Patient/Caregiver Signature _______________________________ Date __________  Clinician Signature _______________________________________ Date __________  Please bring this form and your medication list with you to all your follow-up doctor's appointments.           COMMUNITY REFERRALS UPON DISCHARGE:     Outpatient: PT   &      OT             Agency:[facility] [address] [address]  [address] [place] [address] Phone:[phone]              Appointment Date/Time:WILL CALL TO SET UP FOLLOW UP APPOINTMENTS  Medical Equipment/Items Ordered:ROLLING [facility]                                                 Agency/Supplier:[facility]  [phone]

## 2024-02-04 DIAGNOSIS — F09 Unspecified mental disorder due to known physiological condition: Secondary | ICD-10-CM

## 2024-02-04 DIAGNOSIS — G9340 Encephalopathy, unspecified: Secondary | ICD-10-CM | POA: Diagnosis not present

## 2024-02-04 DIAGNOSIS — R4189 Other symptoms and signs involving cognitive functions and awareness: Secondary | ICD-10-CM

## 2024-02-04 LAB — BASIC METABOLIC PANEL WITH GFR
Anion gap: 11 (ref 5–15)
BUN: 37 mg/dL — ABNORMAL HIGH (ref 6–20)
CO2: 20 mmol/L — ABNORMAL LOW (ref 22–32)
Calcium: 9.7 mg/dL (ref 8.9–10.3)
Chloride: 104 mmol/L (ref 98–111)
Creatinine, Ser: 1.49 mg/dL — ABNORMAL HIGH (ref 0.44–1.00)
GFR, Estimated: 40 mL/min — ABNORMAL LOW (ref >60)
Glucose, Bld: 122 mg/dL — ABNORMAL HIGH (ref 70–99)
Potassium: 4 mmol/L (ref 3.5–5.1)
Sodium: 135 mmol/L (ref 135–145)

## 2024-02-04 MED ORDER — POLYETHYLENE GLYCOL 3350 17 G PO PACK
17.0000 g | PACK | Freq: Two times a day (BID) | ORAL | Status: DC
  Administered 2024-02-04: 17 g via ORAL
  Filled 2024-02-04 (×6): qty 1

## 2024-02-04 NOTE — Progress Notes (Addendum)
 PROGRESS NOTE   Subjective/Complaints: No new complaints this morning Right sided flank pain has improved but was severe yesterday afternoon, discussed UPJ stenosis on CT  ROS: +hematuria, no sweats/chills reported, +constipation , +right sided flank pain  Objective:   CT ABDOMEN PELVIS W CONTRAST Result Date: 02/03/2024 CLINICAL DATA:  Flank pain EXAM: CT ABDOMEN AND PELVIS WITH CONTRAST TECHNIQUE: Multidetector CT imaging of the abdomen and pelvis was performed using the standard protocol following bolus administration of intravenous contrast. RADIATION DOSE REDUCTION: This exam was performed according to the departmental dose-optimization program which includes automated exposure control, adjustment of the mA and/or kV according to patient size and/or use of iterative reconstruction technique. CONTRAST:  75mL OMNIPAQUE  IOHEXOL  350 MG/ML SOLN COMPARISON:  Renal ultrasound 02/03/2024 and PET/CT 01/29/2023 FINDINGS: Lower chest: No acute abnormality. Hepatobiliary: Unremarkable liver. Normal gallbladder. No biliary dilation. Pancreas: Unremarkable. Spleen: Unremarkable. Adrenals/Urinary Tract: Normal adrenal glands. Delayed right nephrogram mild right hydroureteronephrosis upstream from a abrupt transition point in the proximal right ureter in an area of kinking (series 5/image 62). No obstructing stone. Bladder is unremarkable. Stomach/Bowel: Normal caliber large and small bowel. No bowel wall thickening. The appendix is normal.Stomach is within normal limits. Vascular/Lymphatic: No significant vascular findings are present. No enlarged abdominal or pelvic lymph nodes. Reproductive: Hysterectomy.  No adnexal mass. Other: No free intraperitoneal fluid or air. Musculoskeletal: No acute fracture. IMPRESSION: Findings compatible with at least partial right UPJ obstruction. No obstructing stone. This was not present on 01/29/2023 and may be due to a  variety of causes including scar tissue, stricture from infection, a crossing blood vessel, or urothelial malignancy. Consider Lasix renal scan for further evaluation. Electronically Signed   By: Rozell Cornet M.D.   On: 02/03/2024 22:16   US  RENAL Result Date: 02/03/2024 CLINICAL DATA:  144615 Pain 144615 EXAM: RENAL / URINARY TRACT ULTRASOUND COMPLETE COMPARISON:  None Available. FINDINGS: Right Kidney: Renal measurements: 10.3 x 4.1 x 5.8 cm = volume: 127 mL. Normal echogenicity. No mass. No hydronephrosis or nephrolithiasis. Left Kidney: Renal measurements: 10.9 x 5.7 x 5.1 cm = volume: 166 mL. Normal echogenicity. No mass. No hydronephrosis or nephrolithiasis. Bladder: Appears normal for degree of bladder distention. Other: None. IMPRESSION: No hydronephrosis or nephrolithiasis. Electronically Signed   By: Rance Burrows M.D.   On: 02/03/2024 09:04   DG Abd 1 View Result Date: 02/03/2024 CLINICAL DATA:  Abdominal pain EXAM: ABDOMEN - 1 VIEW COMPARISON:  None Available. FINDINGS: Abundant residual fecal material throughout the colon without obstruction IMPRESSION: Abundant residual fecal material throughout the colon without obstruction. Electronically Signed   By: Fredrich Jefferson M.D.   On: 02/03/2024 08:25   No results for input(s): WBC, HGB, HCT, PLT in the last 72 hours.  Recent Labs    02/03/24 0601  NA 137  K 4.2  CL 103  CO2 22  GLUCOSE 122*  BUN 32*  CREATININE 1.17*  CALCIUM  9.6     Intake/Output Summary (Last 24 hours) at 02/04/2024 1251 Last data filed at 02/04/2024 0921 Gross per 24 hour  Intake 240 ml  Output --  Net 240 ml         Physical Exam:  Vital Signs Blood pressure 120/65, pulse 73, temperature 98.5 F (36.9 C), temperature source Oral, resp. rate 18, height 5' 4 (1.626 m), weight 96.2 kg, SpO2 99%.   General: No acute distress Mood and affect are appropriate Heart: Regular rate and rhythm no rubs murmurs or extra sounds Lungs: Clear to  auscultation, breathing unlabored, no rales or wheezes Abdomen: Positive bowel sounds, soft nontender to palpation, nondistended Extremities: No clubbing, cyanosis, or edema Skin: No evidence of breakdown, no evidence of rash Neurologic: Cranial nerves II through XII intact MMT:  5/5 right sided 4/5 left side Sensory exam normal sensation to light touch and proprioception in bilateral upper and lower extremities Cerebellar exam intention tremor with L FNF  Musculoskeletal: no pain with AAROM but limited exam. No joint swelling   Assessment/Plan: 1. Functional deficits which require 3+ hours per day of interdisciplinary therapy in a comprehensive inpatient rehab setting. Physiatrist is providing close team supervision and 24 hour management of active medical problems listed below. Physiatrist and rehab team continue to assess barriers to discharge/monitor patient progress toward functional and medical goals  Care Tool:  Bathing    Body parts bathed by patient: Left arm, Chest, Abdomen, Front perineal area, Right upper leg, Left upper leg, Face, Right arm, Buttocks, Left lower leg, Right lower leg   Body parts bathed by helper: Buttocks, Right arm, Right lower leg, Left lower leg     Bathing assist Assist Level: Independent with assistive device     Upper Body Dressing/Undressing Upper body dressing   What is the patient wearing?: Bra, Pull over shirt    Upper body assist Assist Level: Independent    Lower Body Dressing/Undressing Lower body dressing      What is the patient wearing?: Underwear/pull up, Pants     Lower body assist Assist for lower body dressing: Independent with assitive device     Toileting Toileting    Toileting assist Assist for toileting: Supervision/Verbal cueing     Transfers Chair/bed transfer  Transfers assist  Chair/bed transfer activity did not occur: Safety/medical concerns  Chair/bed transfer assist level: Supervision/Verbal cueing      Locomotion Ambulation   Ambulation assist      Assist level: Contact Guard/Touching assist Assistive device: No Device Max distance: 100   Walk 10 feet activity   Assist     Assist level: Contact Guard/Touching assist Assistive device: No Device   Walk 50 feet activity   Assist Walk 50 feet with 2 turns activity did not occur: Safety/medical concerns  Assist level: Contact Guard/Touching assist Assistive device: No Device    Walk 150 feet activity   Assist Walk 150 feet activity did not occur: Safety/medical concerns         Walk 10 feet on uneven surface  activity   Assist Walk 10 feet on uneven surfaces activity did not occur: Safety/medical concerns   Assist level: Supervision/Verbal cueing Assistive device: Walker-rolling   Wheelchair     Assist Is the patient using a wheelchair?: Yes Type of Wheelchair: Manual    Wheelchair assist level: Dependent - Patient 0%      Wheelchair 50 feet with 2 turns activity    Assist        Assist Level: Dependent - Patient 0%   Wheelchair 150 feet activity     Assist      Assist Level: Dependent - Patient 0%   Blood pressure 120/65, pulse 73, temperature 98.5 F (36.9 C), temperature source Oral, resp. rate 18,  height 5' 4 (1.626 m), weight 96.2 kg, SpO2 99%.  Medical Problem List and Plan: 1. Functional deficits secondary to left hemiparesis secondary to hypertensive encephalopathy versus conversion disorder             -patient may shower             -ELOS/Goals: 7 to 10 days, supervision PT/OT, mod I SLP             - Grounds pass ordered  -IPOC completed  Continue CIR  2.  Antithrombotics: -DVT/anticoagulation:  Pharmaceutical: Lovenox - may d/c amb 73' CGA with PT             -antiplatelet therapy: continue asa  3. Hematuria: UA ordered. Bactrim  started given positive UA, will stop enoxaparin  due to improved ambulation which should help with hematuria Discussed UPJ  stenosis on CT scan, urology consulted UC ordered  4. Constipation: resolved with enema, last BM 6/12  5. Neuropsych/cognition: This patient is capable of making decisions on her own behalf.  6. Abdominal pain: Lidocaine  patch ordered, see #3, continue oxycodone   7. Fluids/Electrolytes/Nutrition: Monitor I/O. Check CMET in am  8. HTN: Monitor BP TID--continue amlodipine  and HCTX Vitals:   02/04/24 0527 02/04/24 0957  BP: 104/63 120/65  Pulse: 91 73  Resp: 18   Temp: 98.5 F (36.9 C)   SpO2: 97% 99%    9. Sarcoidosis/Dyspnea: On breo with improvement in cough. Follwed by Dr. Alver Jobs.  10. Latent TB: Continue INH and pyridoxine  daily. Educate on importance of compliance.  11. Chronic Lumbar facet syndrome/Lumbar radiculopathy: Managed with local measures and s/p median branch block 07/2023 per [facility]               --Voltaren  gel added.              - Reviewed chart; similar presentation to her pain management presentations since 2020.  Has failed cyclobenzaprine  (Flexeril ), gabapentin  (Neurontin , Gralise ), Reports mild benefit with use of Diclofenac  but not has not used recently.              - Lumbar MRI 09/21/23:  1. Mild anterolisthesis of L4-L5 with possible bilateral pars defects at this level.  2. Multilevel lumbar spondylosis, most pronounced at the L4-L5 and L5-S1 levels with marked facet arthropathy. There is mild spinal canal stenosis and subarticular recess narrowing at L4-L5 with disc material likely contacting the bilateral descending L5 nerve roots as described.  3. No substantial foraminal stenosis at any of the lumbar levels.                - Add baclofen  10 mg 3 times daily as needed for muscle spasms   12. Hypokalemia: K+ reviewed and has normalized     Latest Ref Rng & Units 02/03/2024    6:01 AM 01/29/2024    4:55 AM 01/28/2024    7:02 PM  BMP  Glucose 70 - 99 mg/dL 478  295  621   BUN 6 - 20 mg/dL 32  22  26   Creatinine 0.44 - 1.00 mg/dL  3.08  6.57  8.46   Sodium 135 - 145 mmol/L 137  134  138   Potassium 3.5 - 5.1 mmol/L 4.2  4.2  3.6   Chloride 98 - 111 mmol/L 103  101  104   CO2 22 - 32 mmol/L 22  26  22    Calcium  8.9 - 10.3 mg/dL 9.6  9.0  96.2  13. Class 2 obesity: BMI 37.7. Educated on importance of weight loss and mobility to help promote overall health.    14. Microcytic anemia: iron studies reviewed and are stable LOS: 7 days A FACE TO FACE EVALUATION WAS PERFORMED  Jackie Reed 02/04/2024, 12:51 PM

## 2024-02-04 NOTE — Progress Notes (Signed)
 Occupational Therapy Session Note  Patient Details  Name: Jackie Reed MRN: [id number] Date of Birth: 25-Jun-1966  Today's Date: 02/04/2024 OT Individual Time: 4098-1191 OT Individual Time Calculation (min): 38 min    Short Term Goals: Week 1:  OT Short Term Goal 1 (Week 1): See LTG's due to ELOS  Skilled Therapeutic Interventions/Progress Updates:     Pt received sitting up in recliner, dressed and ready for the day. Pt presenting to be in good spirits receptive to skilled OT session reporting 0/10 pain- OT offering intermittent rest breaks, repositioning, and therapeutic support to optimize participation in therapy session. Focused this session on community reintegration with target overall supervision level using RW. Engaged Pt in completing functional mobility to hospital gift shop using RW with Pt able to complete >120ft functional mobility with SUP. While in gift shop, focused activities on safe navigation of obstacles, safety awareness, and implementation of energy conservation technique without use of RW for increased balance challenge. Pt able to navigate through isle of gift shop, retrieve desired items using L UE, and maintain dynamic standing balance while paying for and transporting items with SUP no LOB noted. Pt then tasked with ambulating outside across uneven terrain and up/down inclines using RW with Pt able to complete with SUP. Pt reporting she will be going up/down stairs to enter her home and to reach the second level- engaged Pt in completing stair navigation of 5 stairs up/down using RW with close SUP provided for safety. Pt completed functional mobility back to room using RW with SUP. Pt demonstrating appropriate safety awareness and able to appropriately request rest breaks when needed. Pt was left resting in recliner with call bell in reach and all needs met.    Therapy Documentation Precautions:  Precautions Precautions: Fall Recall of Precautions/Restrictions:  Intact Restrictions Weight Bearing Restrictions Per Provider Order: No   Therapy/Group: Individual Therapy  Geoffery Kiel 02/04/2024, 12:36 PM

## 2024-02-04 NOTE — Consult Note (Signed)
 Neuropsychological Consultation Comprehensive Inpatient Rehab   Patient:   Jackie Reed   DOB:   [birth year]  MR Number:  [id number]  [facility]  [facility] [address] C[facility] [address] [place] [address] Dept: [phone] Loc: [phone]           Date of Service:   02/04/2024  Start Time:   9 AM End Time:   10 AM  Provider/Observer:  Chapman Commodore, Psy.D.       Clinical Neuropsychologist       Billing Code/Service: [phone]  Reason for Service:    Jackie Reed is a 58 year old female referred for neuropsychological consultation during her current admission to the comprehensive inpatient rehabilitation unit.  The reason was due to recent encephalopathic status felt to be likely due to hypertensive crisis.  Patient has a past medical history including hypertension, sarcoidosis, hepatitis, migraines, low back pain, latent TB.  Patient was admitted on 01/19/2024 with sudden onset of left upper extremity and left lower extremity weakness and headache.  Neuroimaging did not identify any occlusion.  She did receive TNK and treated for extremely hide blood pressure.  EEG was negative for seizure.  Neurology question whether hypertensive encephalopathy versus conversion disorder due to significant left extremity weakness and positive however signs were noted.  Patient was admitted to CIR due to functional deficits.  During today's clinical visit the patient was oriented and is noted that she has been making a significant improvement in overall cognitive functioning during CIR efforts.  The patient notes improving motor functions during therapies.  Patient reports that she had not been taking her blood pressure medicines previously and much of our discussions today centered around her focusing on medication compliance and the risk of severe high blood pressure.  The patient admits that she is not always taking her medicines appropriately  but is motivated to be much more diligent going forward.  The patient denied severe anxiety or depression at this point and she is motivated to continue with therapeutic efforts and functional improvement even postdischarge.  HPI for the current admission:    HPI:  Jackie Reed is a 58 year old LH-female with history of HTN, Sarcoidosis, hepatitis, migraines, LBP, Latent TB (positive QuantiFERON test) who was admitted on 01/19/24 with sudden onset of LUE/LLE weakness and HA. CT head negative BP and patient reported elevated  SBP @ 220 at home.  UDS negative. CT head negative. CTA negative for LVO. She received TNKase  and elevated BP treated with labetalol  and hydralazine .  MRI brain negative but she continued to be limited by mild left sided weakness.  EEG negative for seizures. 2 D echo showed EF 60-65% with no wall abnormality and AV sclerosis. Dr. Christiane Cowing questioned HTN encephalopathy v/s conversion d/o due to significant LUE and LLE giveaway weakness and positive Hoover signs. Now on low dose ASA and to continue at discharge. Crestor  added for LDL 84 w/goal <70.   Therapy has been working with patient who has mild cognitive deficits with SLUMS score 21/30 requires min to mod assist overall,has tremors with increase in activity and intermittent ataxic movements. CIR recommended due to functional decline. 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   Medical History:   Past Medical History:  Diagnosis Date   Dyspnea    occasional   E-coli UTI 08/2019   Hepatitis 05/2020   Hepatitis B core antibody positive  Hypertension    Migraine    Plantar fibromatosis    PONV (postoperative nausea and vomiting)    Pre-diabetes    Sarcoidosis    TB lung, latent    Thyroid  nodule 01/29/2023   seen on PET scan   Vitamin B12 deficiency 06/2019   Vitamin D  deficiency 06/2019         Patient Active Problem List   Diagnosis Date Noted   Cognitive and neurobehavioral  dysfunction 02/04/2024   Encephalopathy 01/28/2024   Encephalopathy, hypertensive 01/20/2024   Medication monitoring encounter 10/01/2023   TB lung, latent 08/27/2023   Sarcoidosis 04/22/2023   Adenopathy 02/09/2023   History of COVID-19 10/03/2020   Hepatitis B carrier (HCC) 07/03/2020   Mass of left hand 10/02/2019   Elevated blood pressure reading in office with diagnosis of hypertension 09/11/2019   Pre-diabetes 06/14/2019   Low back pain without sciatica 03/07/2019   Back muscle spasm 03/07/2019   History of recent fall 12/31/2018   Sprain of right ankle 11/18/2018   Class 2 severe obesity due to excess calories with serious comorbidity and body mass index (BMI) of 35.0 to 35.9 in adult [facility]) 10/31/2018   Essential hypertension, benign 11/08/2015   Tendon calcification 11/08/2015   Adjustment disorder with mixed anxiety and depressed mood 11/08/2015   Insomnia 11/08/2015    Behavioral Observation/Mental Status:   Jackie Reed  presents as a 58 y.o.-year-old Right handed African American Female who appeared her stated age. her dress was Appropriate and she was Well Groomed and her manners were Appropriate to the situation.  her participation was indicative of Appropriate behaviors.  There were physical disabilities noted.  she displayed an appropriate level of cooperation and motivation.    Interactions:    Active Appropriate  Attention:   abnormal and attention span appeared shorter than expected for age  Memory:   within normal limits; recent and remote memory intact  Visuo-spatial:   not examined  Speech (Volume):  normal  Speech:   normal; normal  Thought Process:  Coherent and Relevant  Coherent and Linear  Though Content:  WNL; not suicidal and not homicidal  Orientation:   person, place, and situation  Judgment:   Good  Planning:   Fair  Affect:    Appropriate  Mood:    Euthymic  Insight:   Good  Intelligence:   normal  Psychiatric  History:  Patient does have a past history of adjustment disorder with anxiety and depressive symptoms.  Patient on no psychotropic medications currently and denies significant anxiety or depressive symptoms and is motivated to continue with therapeutic efforts.   Family Med/Psych History:  Family History  Problem Relation Age of Onset   Heart failure Mother    Diabetes Mother    Heart failure Father    Hypertension Father    Hyperlipidemia Father    Colon cancer Neg Hx    Colon polyps Neg Hx    Breast cancer Neg Hx    Crohn's disease Neg Hx    Esophageal cancer Neg Hx    Rectal cancer Neg Hx    Stomach cancer Neg Hx    Ulcerative colitis Neg Hx    Impression/DX:   Jackie Reed is a 58 year old female referred for neuropsychological consultation during her current admission to the comprehensive inpatient rehabilitation unit.  The reason was due to recent encephalopathic status felt to be likely due to hypertensive crisis.  Patient has a past medical history including hypertension, sarcoidosis, hepatitis,  migraines, low back pain, latent TB.  Patient was admitted on 01/19/2024 with sudden onset of left upper extremity and left lower extremity weakness and headache.  Neuroimaging did not identify any occlusion.  She did receive TNK and treated for extremely hide blood pressure.  EEG was negative for seizure.  Neurology question whether hypertensive encephalopathy versus conversion disorder due to significant left extremity weakness and positive however signs were noted.  Patient was admitted to CIR due to functional deficits.  During today's clinical visit the patient was oriented and is noted that she has been making a significant improvement in overall cognitive functioning during CIR efforts.  The patient notes improving motor functions during therapies.  Patient reports that she had not been taking her blood pressure medicines previously and much of our discussions today centered around her  focusing on medication compliance and the risk of severe high blood pressure.  The patient admits that she is not always taking her medicines appropriately but is motivated to be much more diligent going forward.  The patient denied severe anxiety or depression at this point and she is motivated to continue with therapeutic efforts and functional improvement even postdischarge.          Electronically Signed   _______________________ Chapman Commodore, Psy.D. Clinical Neuropsychologist

## 2024-02-04 NOTE — Consult Note (Signed)
 Urology Consult Note   Requesting Attending Physician:  Liam Redhead, MD Service Providing Consult: Urology  Consulting Attending: Dr. Valeta Gaudier   Reason for Consult: partial UPJ obstruction  HPI: Jackie Reed is seen in consultation for reasons noted above at the request of Raulkar, Keven Pel, MD. patient is a 58 year old female admitted to inpatient rehabilitation for left-sided weakness following a stroke.  She reported several days of significant right-sided pain.  CT A/P noted severe constipation and at least partial right side UPJ obstruction.  [facility] was consulted to speak to these findings. On my arrival patient was alert, oriented, and in no distress.  She was resting up in chair and had no specific urologic complaints at this time.  ------------------  Assessment:   58 y.o. female with admitted to inpatient rehabilitation for functional deficits following presumed CVA .  Patient complaining of pain on the right side and CT A/P identified partial right UPJ obstruction.   Recommendations: # Partial right UPJ obstruction # Severe constipation Patient just had first bowel movement and over 2 weeks.  She has had an impressive amount of laxatives with minimal improvement.  She does feel somewhat better today.  She is localizing her pain on her right side midline below the rib cage and radiating around to her right groin.  This had completely resolved by my arrival after a dose of pain medication.  Curiously she is identifying her discomfort from the area after the transition point of her ureter down to the level of the bladder.  She had no CVA tenderness.  I believe most of what she is experiencing is secondary to severe constipation.  Reviewed case and plan including images of renal anatomy, UPJ obstruction, stents, and discussed percutaneous nephrostomy tube.  Since her renal function is only barely elevated and her pain has resolved, shared decision was made to proceed  with Lasix renal scan and then reevaluate the need for possible percutaneous nephrostomy tube tomorrow.    N.p.o. at midnight  May benefit from contacting GI.  Case and plan discussed with Dr. Valeta Gaudier and primary team  Past Medical History: Past Medical History:  Diagnosis Date   Dyspnea    occasional   E-coli UTI 08/2019   Hepatitis 05/2020   Hepatitis B core antibody positive   Hypertension    Migraine    Plantar fibromatosis    PONV (postoperative nausea and vomiting)    Pre-diabetes    Sarcoidosis    TB lung, latent    Thyroid  nodule 01/29/2023   seen on PET scan   Vitamin B12 deficiency 06/2019   Vitamin D  deficiency 06/2019    Past Surgical History:  Past Surgical History:  Procedure Laterality Date   ABDOMINAL HYSTERECTOMY     FINE NEEDLE ASPIRATION  02/19/2023   Procedure: FINE NEEDLE ASPIRATION (FNA) LINEAR;  Surgeon: Prudy Brownie, DO;  Location: [facility];  Service: Cardiopulmonary;;   FOOT SURGERY Bilateral 1985   MASS EXCISION Right 05/19/2018   Procedure: EXCISION PALMAR NODULES RIGHT HAND;  Surgeon: Brunilda Capra, MD;  Location: [facility];  Service: Orthopedics;  Laterality: Right;   PARTIAL HYSTERECTOMY  1997   VIDEO BRONCHOSCOPY WITH ENDOBRONCHIAL ULTRASOUND Bilateral 02/19/2023   Procedure: VIDEO BRONCHOSCOPY WITH ENDOBRONCHIAL ULTRASOUND;  Surgeon: Prudy Brownie, DO;  Location: [facility];  Service: Cardiopulmonary;  Laterality: Bilateral;    Medication: Current Facility-Administered Medications  Medication Dose Route Frequency Provider Last Rate Last Admin   acetaminophen  (TYLENOL ) tablet 325-650  mg  325-650 mg Oral Q4H PRN Love, Pamela S, PA-C   650 mg at 02/03/24 1403   alum & mag hydroxide-simeth (MAALOX/MYLANTA) 200-200-20 MG/5ML suspension 30 mL  30 mL Oral Q4H PRN Love, Pamela S, PA-C   30 mL at 01/31/24 2319   amLODipine  (NORVASC ) tablet 10 mg  10 mg Oral Daily Love, Pamela S, PA-C   10 mg at 02/04/24 1002   aspirin  EC  tablet 81 mg  81 mg Oral Daily Love, Pamela S, PA-C   81 mg at 02/04/24 1002   baclofen  (LIORESAL ) tablet 10 mg  10 mg Oral TID PRN Engler, Morgan C, DO   10 mg at 02/03/24 2005   bisacodyl  (DULCOLAX) suppository 10 mg  10 mg Rectal Daily PRN Love, Pamela S, PA-C       diclofenac  Sodium (VOLTAREN ) 1 % topical gel 2 g  2 g Topical QID Love, Pamela S, PA-C   2 g at 02/03/24 2006   diphenhydrAMINE  (BENADRYL ) capsule 25 mg  25 mg Oral Q6H PRN Love, Pamela S, PA-C       fluticasone  furoate-vilanterol (BREO ELLIPTA ) 100-25 MCG/ACT 1 puff  1 puff Inhalation Daily Love, Renay Carota, PA-C   1 puff at 02/04/24 1006   guaiFENesin -dextromethorphan (ROBITUSSIN DM) 100-10 MG/5ML syrup 5-10 mL  5-10 mL Oral Q6H PRN Love, Pamela S, PA-C       hydrochlorothiazide  (HYDRODIURIL ) tablet 25 mg  25 mg Oral Daily Love, Pamela S, PA-C   25 mg at 02/04/24 1002   isoniazid  (NYDRAZID ) tablet 300 mg  300 mg Oral q AM Love, Pamela S, PA-C   300 mg at 02/03/24 2006   lidocaine  (LIDODERM ) 5 % 1 patch  1 patch Transdermal Q24H Raulkar, Keven Pel, MD   1 patch at 02/03/24 1054   melatonin tablet 5 mg  5 mg Oral QHS PRN Love, Pamela S, PA-C   5 mg at 02/03/24 2005   oxyCODONE  (Oxy IR/ROXICODONE ) immediate release tablet 5 mg  5 mg Oral Q3H PRN Love, Pamela S, PA-C   5 mg at 02/03/24 2005   potassium chloride  SA (KLOR-CON  M) CR tablet 20 mEq  20 mEq Oral Daily Love, Pamela S, PA-C   20 mEq at 02/04/24 1004   prochlorperazine  (COMPAZINE ) tablet 5-10 mg  5-10 mg Oral Q6H PRN Love, Pamela S, PA-C   10 mg at 02/03/24 1805   Or   prochlorperazine  (COMPAZINE ) suppository 12.5 mg  12.5 mg Rectal Q6H PRN Love, Pamela S, PA-C       Or   prochlorperazine  (COMPAZINE ) injection 5-10 mg  5-10 mg Intravenous Q6H PRN Love, Pamela S, PA-C       pyridOXINE  (VITAMIN B6) tablet 50 mg  50 mg Oral Daily Love, Pamela S, PA-C   50 mg at 02/04/24 1002   rosuvastatin  (CRESTOR ) tablet 20 mg  20 mg Oral Daily Love, Pamela S, PA-C   20 mg at 02/04/24 1002    senna-docusate (Senokot-S) tablet 2 tablet  2 tablet Oral BID Love, Pamela S, PA-C   2 tablet at 02/04/24 1002   sodium chloride  (OCEAN) 0.65 % nasal spray 1 spray  1 spray Each Nare PRN Genetta Kenning, MD       sodium chloride  flush (NS) 0.9 % injection 10-40 mL  10-40 mL Intracatheter Q12H Raulkar, Keven Pel, MD   10 mL at 02/04/24 1004   sodium chloride  flush (NS) 0.9 % injection 10-40 mL  10-40 mL Intracatheter PRN Raulkar, Keven Pel, MD  sodium phosphate  (FLEET) enema 1 enema  1 enema Rectal Once PRN Love, Pamela S, PA-C        Allergies: Allergies  Allergen Reactions   Amoxil  [Amoxicillin ] Shortness Of Breath and Palpitations   Morphine  And Codeine Hives and Rash   Ultram  [Tramadol ] Nausea And Vomiting    Social History: Social History   Tobacco Use   Smoking status: Never   Smokeless tobacco: Never  Vaping Use   Vaping status: Never Used  Substance Use Topics   Alcohol use: No    Alcohol/week: 0.0 standard drinks of alcohol   Drug use: No    Family History Family History  Problem Relation Age of Onset   Heart failure Mother    Diabetes Mother    Heart failure Father    Hypertension Father    Hyperlipidemia Father    Colon cancer Neg Hx    Colon polyps Neg Hx    Breast cancer Neg Hx    Crohn's disease Neg Hx    Esophageal cancer Neg Hx    Rectal cancer Neg Hx    Stomach cancer Neg Hx    Ulcerative colitis Neg Hx     Review of Systems  Genitourinary:  Negative for dysuria, flank pain, frequency, hematuria and urgency.     Objective   Vital signs in last 24 hours: BP 120/65 (BP Location: Left Arm)   Pulse 73   Temp 98.5 F (36.9 C) (Oral)   Resp 18   Ht 5' 4 (1.626 m)   Wt 96.2 kg   SpO2 99%   BMI 36.40 kg/m   Physical Exam General: A&O, resting, appropriate HEENT: [place]/AT Pulmonary: Normal work of breathing Cardiovascular: no cyanosis Abdomen: Soft, NTTP, nondistended GU: No CVA tenderness Neuro: Appropriate, no focal neurological  deficits  Most Recent Labs: Lab Results  Component Value Date   WBC 6.2 01/31/2024   HGB 11.9 (L) 01/31/2024   HCT 38.4 01/31/2024   PLT 412 (H) 01/31/2024    Lab Results  Component Value Date   NA 137 02/03/2024   K 4.2 02/03/2024   CL 103 02/03/2024   CO2 22 02/03/2024   BUN 32 (H) 02/03/2024   CREATININE 1.17 (H) 02/03/2024   CALCIUM  9.6 02/03/2024   MG 2.4 01/20/2024    Lab Results  Component Value Date   INR 1.1 01/19/2024   APTT 34 01/19/2024     Urine Culture: @[id number] (laburin,org,[id number],[id number])@   IMAGING: CT ABDOMEN PELVIS W CONTRAST Result Date: 02/03/2024 CLINICAL DATA:  Flank pain EXAM: CT ABDOMEN AND PELVIS WITH CONTRAST TECHNIQUE: Multidetector CT imaging of the abdomen and pelvis was performed using the standard protocol following bolus administration of intravenous contrast. RADIATION DOSE REDUCTION: This exam was performed according to the departmental dose-optimization program which includes automated exposure control, adjustment of the mA and/or kV according to patient size and/or use of iterative reconstruction technique. CONTRAST:  75mL OMNIPAQUE  IOHEXOL  350 MG/ML SOLN COMPARISON:  Renal ultrasound 02/03/2024 and PET/CT 01/29/2023 FINDINGS: Lower chest: No acute abnormality. Hepatobiliary: Unremarkable liver. Normal gallbladder. No biliary dilation. Pancreas: Unremarkable. Spleen: Unremarkable. Adrenals/Urinary Tract: Normal adrenal glands. Delayed right nephrogram mild right hydroureteronephrosis upstream from a abrupt transition point in the proximal right ureter in an area of kinking (series 5/image 62). No obstructing stone. Bladder is unremarkable. Stomach/Bowel: Normal caliber large and small bowel. No bowel wall thickening. The appendix is normal.Stomach is within normal limits. Vascular/Lymphatic: No significant vascular findings are present. No enlarged abdominal or pelvic lymph nodes. Reproductive: Hysterectomy.  No adnexal mass. Other: No free  intraperitoneal fluid or air. Musculoskeletal: No acute fracture. IMPRESSION: Findings compatible with at least partial right UPJ obstruction. No obstructing stone. This was not present on 01/29/2023 and may be due to a variety of causes including scar tissue, stricture from infection, a crossing blood vessel, or urothelial malignancy. Consider Lasix renal scan for further evaluation. Electronically Signed   By: Rozell Cornet M.D.   On: 02/03/2024 22:16   US  RENAL Result Date: 02/03/2024 CLINICAL DATA:  144615 Pain 144615 EXAM: RENAL / URINARY TRACT ULTRASOUND COMPLETE COMPARISON:  None Available. FINDINGS: Right Kidney: Renal measurements: 10.3 x 4.1 x 5.8 cm = volume: 127 mL. Normal echogenicity. No mass. No hydronephrosis or nephrolithiasis. Left Kidney: Renal measurements: 10.9 x 5.7 x 5.1 cm = volume: 166 mL. Normal echogenicity. No mass. No hydronephrosis or nephrolithiasis. Bladder: Appears normal for degree of bladder distention. Other: None. IMPRESSION: No hydronephrosis or nephrolithiasis. Electronically Signed   By: Rance Burrows M.D.   On: 02/03/2024 09:04   DG Abd 1 View Result Date: 02/03/2024 CLINICAL DATA:  Abdominal pain EXAM: ABDOMEN - 1 VIEW COMPARISON:  None Available. FINDINGS: Abundant residual fecal material throughout the colon without obstruction IMPRESSION: Abundant residual fecal material throughout the colon without obstruction. Electronically Signed   By: Fredrich Jefferson M.D.   On: 02/03/2024 08:25    ------  Alla Ar, NP Pager: [phone]   Please contact the urology consult pager with any further questions/concerns.

## 2024-02-04 NOTE — Plan of Care (Signed)
  Problem: Consults Goal: RH STROKE PATIENT EDUCATION Description: See Patient Education module for education specifics  Outcome: Progressing   Problem: RH BOWEL ELIMINATION Goal: RH STG MANAGE BOWEL WITH ASSISTANCE Description: STG Manage Bowel with toileting Assistance. Outcome: Progressing Goal: RH STG MANAGE BOWEL W/MEDICATION W/ASSISTANCE Description: STG Manage Bowel with Medication with mod I Assistance. Outcome: Progressing   Problem: RH SAFETY Goal: RH STG ADHERE TO SAFETY PRECAUTIONS W/ASSISTANCE/DEVICE Description: STG Adhere to Safety Precautions With cues Assistance/Device. Outcome: Progressing   Problem: RH PAIN MANAGEMENT Goal: RH STG PAIN MANAGED AT OR BELOW PT'S PAIN GOAL Description: Pain < 4 with prns Outcome: Progressing   Problem: RH KNOWLEDGE DEFICIT Goal: RH STG INCREASE KNOWLEDGE OF DIABETES Description: Patient and spouse will be able to manage prediabetes using educational resources for dietary modification independently Outcome: Progressing Goal: RH STG INCREASE KNOWLEDGE OF HYPERTENSION Description: Patient and spouse will be able to manage HTN using educational resources for medications and dietary modification independently Outcome: Progressing Goal: RH STG INCREASE KNOWLEGDE OF HYPERLIPIDEMIA Description: Patient and spouse will be able to manage HLD using educational resources for medications anddietary modification independently Outcome: Progressing Goal: RH STG INCREASE KNOWLEDGE OF STROKE PROPHYLAXIS Description: Patient and spouse will be able to manage secondary risks using educational resources for medications and dietary modification independently Outcome: Progressing

## 2024-02-04 NOTE — Progress Notes (Signed)
 Alla Ar, NP -Urology consulted for recommendations based on CT findings.

## 2024-02-04 NOTE — Progress Notes (Signed)
 Physical Therapy Session Note  Patient Details  Name: Jackie Reed MRN: [id number] Date of Birth: April 26, 1966  Today's Date: 02/04/2024 PT Individual Time: 0805-0902 PT Individual Time Calculation (min): 57 min   Short Term Goals: Week 1:  PT Short Term Goal 1 (Week 1): Pt will ambulate with foot clearance for short distances PT Short Term Goal 2 (Week 1): pt will navigate 6 stairs with 1 rail and assist PT Short Term Goal 3 (Week 1): Pt will perform STS with supervision  Skilled Therapeutic Interventions/Progress Updates: Patient sitting in recliner on entrance to room. Patient alert and agreeable to PT session.   Patient reported no pain this morning, but reported being in a lot of pain the previous night that led pt needing to get CT done.  Therapeutic Activity: Transfers: Pt performed sit<>stand transfers throughout session with RW or no AD with supervision/modI.  - Pt ambulated from room<main gym in RW with supervision to improve household navigation - Pt navigated stairwell outside of main gym with RHR ascending and reciprocal pattern throughout. Pt overall with supervision and recall of previous cue to watch for L heel catching step, and able to problem solve to navigate safely. Pt performed x 2 with one seated rest break. - Pt ambulated around main gym (100'+) without AD and with CGA. Pt required increased time to safely navigate pivot turns with pt reporting having to think about foot placement to avoid LOB (NMRE to follow). - Pt ambulated back to room at end of session without AD and CGA and VC to improve heel-strike/step clearance on L LE  Neuromuscular Re-ed: NMR facilitated during session with focus on dynamic standing balance. - Pt ambulated around orange cones (each cone met, pt to ambulate completely around it, then move to next cone and repeat). Pt did so with CGA and presented with some hesitation to advance LE through pivot (reported fear of falling). Pt performed 2  rounds with rest break provided - Pt ambulated around main gym loop with tidal tank in B UE, then instructed to perform weave pattern through orange cones, and then full circles around each cone coming back down. Pt performed with CGA overall for safety. Pt with improved cadence when making pivot turns when cued to avoid looking at ground.  NMR performed for improvements in motor control and coordination, balance, sequencing, judgement, and self confidence/ efficacy in performing all aspects of mobility at highest level of independence.   Patient sitting in recliner at end of session with brakes locked, and all needs within reach.      Therapy Documentation Precautions:  Precautions Precautions: Fall Recall of Precautions/Restrictions: Intact Restrictions Weight Bearing Restrictions Per Provider Order: No   Therapy/Group: Individual Therapy  Rickie Gutierres PTA 02/04/2024, 9:25 AM

## 2024-02-04 NOTE — Progress Notes (Signed)
 Occupational Therapy Session Note  Patient Details  Name: MELISSSA DONNER MRN: [id number] Date of Birth: 1966/03/09  Today's Date: 02/04/2024 OT Individual Time: 0700-0725 OT Individual Time Calculation (min): 25 min    Short Term Goals: Week 1:  OT Short Term Goal 1 (Week 1): See LTG's due to ELOS  Skilled Therapeutic Interventions/Progress Updates:    Pt resting in recliner upon arrival. OT intervention with focus on BUE [facility] activities-tearing paper, crumpling paper with unilateral hand, in hand manipulation with coins, and use of foam for strengthening. Pt able to return demonstrate all activities. Pt pleased with progress since admission to CIR. Pt remained in recliner with all needs within reach.   Therapy Documentation Precautions:  Precautions Precautions: Fall Recall of Precautions/Restrictions: Intact Restrictions Weight Bearing Restrictions Per Provider Order: No Pain:  Pt reports ongoing LB pain (unrated); resting in recliner   Therapy/Group: Individual Therapy  Doak Free 02/04/2024, 7:26 AM

## 2024-02-04 NOTE — Discharge Summary (Signed)
 Physician Discharge Summary  Patient ID: Jackie Reed MRN: [id number] DOB/AGE: 1965/12/22 58 y.o.  Admit date: 01/28/2024 Discharge date: 02/08/2024  Discharge Diagnoses:  Principal Problem:   Encephalopathy Active Problems:   Encephalopathy, hypertensive   Cognitive and neurobehavioral dysfunction Constipation  Hydroureteronephrosis  Latent TB UTI Hypokalemia  Microcytic anemia    Discharged Condition: stable  Significant Diagnostic Studies: NM Renal Imaging Flow W/Pharm Result Date: 02/07/2024 CLINICAL DATA:  Urinary fistula.  RIGHT hydronephrosis EXAM: NUCLEAR MEDICINE RENAL SCAN WITH DIURETIC ADMINISTRATION TECHNIQUE: Radionuclide angiographic and sequential renal images were obtained after intravenous injection of radiopharmaceutical. Imaging was continued during slow intravenous injection of Lasix approximately 15 minutes after the start of the examination. RADIOPHARMACEUTICALS:  5.5 mCi Technetium-54m MAG3 IV COMPARISON:  CT 02/03/2024 FINDINGS: Flow:  Prompt symmetric arterial flow to the kidneys. Left renogram: Uniform uptake of counts in the renal cortex. Counts are promptly excreted into the collecting system and cleared prior to administration of Lasix. Lasix augment clearance. No postvoid residual. Right renogram: Uniform uptake of counts in the renal cortex. Counts are promptly excreted into the collecting system and cleared prior to administration of Lasix. Lasix augment clearance. No postvoid residual. On the summed images the RIGHT renal pelvis is slightly more prominent than LEFT. Differential: Left kidney = 52 % Right kidney = 48 % T1/2 post Lasix : Left kidney = (counts clear near completely prior to administration of Lasix) Right kidney = (counts clear near completely prior to administration of Lasix) IMPRESSION: 1. Normal function of the LEFT and RIGHT kidney. No obstructive hydronephrosis. 2. Slight prominence of the RIGHT renal pelvis. Electronically Signed   By:  Deboraha Fallow M.D.   On: 02/07/2024 15:45   CT ABDOMEN PELVIS W CONTRAST Result Date: 02/03/2024 CLINICAL DATA:  Flank pain EXAM: CT ABDOMEN AND PELVIS WITH CONTRAST TECHNIQUE: Multidetector CT imaging of the abdomen and pelvis was performed using the standard protocol following bolus administration of intravenous contrast. RADIATION DOSE REDUCTION: This exam was performed according to the departmental dose-optimization program which includes automated exposure control, adjustment of the mA and/or kV according to patient size and/or use of iterative reconstruction technique. CONTRAST:  75mL OMNIPAQUE  IOHEXOL  350 MG/ML SOLN COMPARISON:  Renal ultrasound 02/03/2024 and PET/CT 01/29/2023 FINDINGS: Lower chest: No acute abnormality. Hepatobiliary: Unremarkable liver. Normal gallbladder. No biliary dilation. Pancreas: Unremarkable. Spleen: Unremarkable. Adrenals/Urinary Tract: Normal adrenal glands. Delayed right nephrogram mild right hydroureteronephrosis upstream from a abrupt transition point in the proximal right ureter in an area of kinking (series 5/image 62). No obstructing stone. Bladder is unremarkable. Stomach/Bowel: Normal caliber large and small bowel. No bowel wall thickening. The appendix is normal.Stomach is within normal limits. Vascular/Lymphatic: No significant vascular findings are present. No enlarged abdominal or pelvic lymph nodes. Reproductive: Hysterectomy.  No adnexal mass. Other: No free intraperitoneal fluid or air. Musculoskeletal: No acute fracture. IMPRESSION: Findings compatible with at least partial right UPJ obstruction. No obstructing stone. This was not present on 01/29/2023 and may be due to a variety of causes including scar tissue, stricture from infection, a crossing blood vessel, or urothelial malignancy. Consider Lasix renal scan for further evaluation. Electronically Signed   By: Rozell Cornet M.D.   On: 02/03/2024 22:16   US  RENAL Result Date: 02/03/2024 CLINICAL  DATA:  144615 Pain 144615 EXAM: RENAL / URINARY TRACT ULTRASOUND COMPLETE COMPARISON:  None Available. FINDINGS: Right Kidney: Renal measurements: 10.3 x 4.1 x 5.8 cm = volume: 127 mL. Normal echogenicity. No mass. No hydronephrosis or nephrolithiasis. Left  Kidney: Renal measurements: 10.9 x 5.7 x 5.1 cm = volume: 166 mL. Normal echogenicity. No mass. No hydronephrosis or nephrolithiasis. Bladder: Appears normal for degree of bladder distention. Other: None. IMPRESSION: No hydronephrosis or nephrolithiasis. Electronically Signed   By: Rance Burrows M.D.   On: 02/03/2024 09:04   DG Abd 1 View Result Date: 02/03/2024 CLINICAL DATA:  Abdominal pain EXAM: ABDOMEN - 1 VIEW COMPARISON:  None Available. FINDINGS: Abundant residual fecal material throughout the colon without obstruction IMPRESSION: Abundant residual fecal material throughout the colon without obstruction. Electronically Signed   By: Fredrich Jefferson M.D.   On: 02/03/2024 08:25   DG Abd 1 View Result Date: 01/31/2024 CLINICAL DATA:  Abdominal pain EXAM: ABDOMEN - 1 VIEW COMPARISON:  None Available. FINDINGS: A moderate to large volume of stool material is present throughout the colon. Air is present within the rectum. IMPRESSION: 1. A moderate to large volume of stool material is present in the colon. Electronically Signed   By: Reagan Camera M.D.   On: 01/31/2024 10:04   MR CERVICAL SPINE WO CONTRAST Result Date: 01/22/2024 CLINICAL DATA:  Initial evaluation for left upper extremity weakness. EXAM: MRI CERVICAL SPINE WITHOUT CONTRAST TECHNIQUE: Multiplanar, multisequence MR imaging of the cervical spine was performed. No intravenous contrast was administered. COMPARISON:  CT from 10/21/2018 FINDINGS: Alignment: Straightening of the normal cervical lordosis. No listhesis. Vertebrae: Vertebral body height maintained without acute or chronic fracture. Bone marrow signal intensity within normal limits. No worrisome osseous lesions. No abnormal marrow  edema. Cord: Normal signal and morphology. Posterior Fossa, vertebral arteries, paraspinal tissues: Unremarkable. Disc levels: C2-C3: Small central disc protrusion mildly indents the ventral thecal sac. No spinal stenosis. Foramina remain patent. C3-C4: Small left paracentral disc protrusion indents the ventral thecal sac. Minimal cord flattening without cord signal changes or significant spinal stenosis. Foramina remain patent. C4-C5: Small central to left paracentral disc protrusion indents the ventral thecal sac. Minimal cord flattening without cord signal changes or significant spinal stenosis. Foramina remain patent. C5-C6: Left paracentral disc protrusion indents and partially faces the ventral thecal sac. Mild flattening of the left hemi cord without cord signal changes or significant spinal stenosis. Foramina remain patent. C6-C7: Small right paracentral disc protrusion indents the right ventral thecal sac. Minimal cord flattening without cord signal changes or significant spinal stenosis. Foramina remain patent. C7-T1: Negative interspace. Mild left-sided facet hypertrophy. No canal or foraminal stenosis. IMPRESSION: 1. Small central to left paracentral disc protrusions at C2-3 through C5-6 as above. Secondary minor cord flattening without cord signal changes or significant spinal stenosis. Findings could contribute to left upper extremity symptoms. 2. Small right paracentral disc protrusion at C6-7 without significant stenosis. Electronically Signed   By: Virgia Griffins M.D.   On: 01/22/2024 18:07   EEG adult Result Date: 01/22/2024 Arleene Lack, MD     01/22/2024  1:06 PM Patient Name: Jackie Reed MRN: [id number] Epilepsy Attending: Arleene Lack Referring Physician/Provider: Donalynn Fry, MD Date: 01/22/2024 Duration: 22.50 mins Patient history: 58yo F with left sided weakness. EEG to evaluate for seizure Level of alertness: Awake, asleep AEDs during EEG study: None Technical  aspects: This EEG study was done with scalp electrodes positioned according to the 10-20 International system of electrode placement. Electrical activity was reviewed with band pass filter of 1-70Hz , sensitivity of 7 uV/mm, display speed of 56mm/sec with a 60Hz  notched filter applied as appropriate. EEG data were recorded continuously and digitally stored.  Video monitoring was available and  reviewed as appropriate. Description: No clear posterior dominant rhythm was seen. Sleep was characterized by vertex waves, sleep spindles (12 to 14 Hz), maximal frontocentral region. There is an excessive amount of 15 to 18 Hz beta activity distributed symmetrically and diffusely.  Physiologic photic driving was seen during photic stimulation.  Hyperventilation  was not performed.   ABNORMALITY - Excessive beta, generalized IMPRESSION: This study is within normal limits. The excessive beta activity seen in the background is most likely due to the effect of benzodiazepine and is a benign EEG pattern. No seizures or epileptiform discharges were seen throughout the recording. A normal interictal EEG does not exclude the diagnosis of epilepsy. Priyanka Suzanne Erps   CT HEAD WO CONTRAST ( ) Result Date: 01/20/2024 CLINICAL DATA:  Stroke follow-up 24 hours after tPA administration EXAM: CT HEAD WITHOUT CONTRAST TECHNIQUE: Contiguous axial images were obtained from the base of the skull through the vertex without intravenous contrast. RADIATION DOSE REDUCTION: This exam was performed according to the departmental dose-optimization program which includes automated exposure control, adjustment of the mA and/or kV according to patient size and/or use of iterative reconstruction technique. COMPARISON:  01/19/2024 FINDINGS: Brain: There is no mass, hemorrhage or extra-axial collection. There is generalized atrophy without lobar predilection. Hypodensity of the white matter is most commonly associated with chronic microvascular disease.  Vascular: No hyperdense vessel or unexpected vascular calcification. Skull: The visualized skull base, calvarium and extracranial soft tissues are normal. Sinuses/Orbits: No fluid levels or advanced mucosal thickening of the visualized paranasal sinuses. No mastoid or middle ear effusion. Normal orbits. Other: None. IMPRESSION: 1. No acute intracranial abnormality. 2. Generalized atrophy and findings of chronic microvascular disease. Electronically Signed   By: Juanetta Nordmann M.D.   On: 01/20/2024 20:13   ECHOCARDIOGRAM COMPLETE Result Date: 01/20/2024    ECHOCARDIOGRAM REPORT   Patient Name:   Jackie Reed Date of Exam: 01/20/2024 Medical Rec #:  [id number]     Height:       64.0 in Accession #:    [id number]    Weight:       219.6 lb Date of Birth:  [birth year]     BSA:          2.036 m Patient Age:    57 years      BP:           142/83 mmHg Patient Gender: F             HR:           71 bpm. Exam Location:  Inpatient Procedure: 2D Echo, Cardiac Doppler, Color Doppler and Intracardiac            Opacification Agent (Both Spectral and Color Flow Doppler were            utilized during procedure). Indications:    Stroke  History:        Patient has no prior history of Echocardiogram examinations.                 Risk Factors:Hypertension.  Sonographer:    Janette Medley Referring Phys: ERIN C LEHNER IMPRESSIONS  1. Left ventricular ejection fraction, by estimation, is 60 to 65%. The left ventricle has normal function. The left ventricle has no regional wall motion abnormalities. Left ventricular diastolic parameters were normal.  2. Right ventricular systolic function is normal. The right ventricular size is normal.  3. The mitral valve is normal in structure. No evidence of mitral valve regurgitation. No evidence  of mitral stenosis.  4. The aortic valve is tricuspid. Aortic valve regurgitation is not visualized. Aortic valve sclerosis/calcification is present, without any evidence of aortic stenosis.  5. The inferior  vena cava is normal in size with greater than 50% respiratory variability, suggesting right atrial pressure of 3 mmHg. Conclusion(s)/Recommendation(s): No intracardiac source of embolism detected on this transthoracic study. Consider a transesophageal echocardiogram to exclude cardiac source of embolism if clinically indicated. FINDINGS  Left Ventricle: Left ventricular ejection fraction, by estimation, is 60 to 65%. The left ventricle has normal function. The left ventricle has no regional wall motion abnormalities. The left ventricular internal cavity size was normal in size. There is  no left ventricular hypertrophy. Left ventricular diastolic parameters were normal. Normal left ventricular filling pressure. Right Ventricle: The right ventricular size is normal. No increase in right ventricular wall thickness. Right ventricular systolic function is normal. Left Atrium: Left atrial size was normal in size. Right Atrium: Right atrial size was normal in size. Pericardium: There is no evidence of pericardial effusion. Mitral Valve: The mitral valve is normal in structure. No evidence of mitral valve regurgitation. No evidence of mitral valve stenosis. Tricuspid Valve: The tricuspid valve is normal in structure. Tricuspid valve regurgitation is not demonstrated. No evidence of tricuspid stenosis. Aortic Valve: The aortic valve is tricuspid. Aortic valve regurgitation is not visualized. Aortic valve sclerosis/calcification is present, without any evidence of aortic stenosis. Pulmonic Valve: The pulmonic valve was normal in structure. Pulmonic valve regurgitation is not visualized. No evidence of pulmonic stenosis. Aorta: The aortic root is normal in size and structure. Venous: The inferior vena cava is normal in size with greater than 50% respiratory variability, suggesting right atrial pressure of 3 mmHg. IAS/Shunts: No atrial level shunt detected by color flow Doppler.  LEFT VENTRICLE PLAX 2D LVIDd:         4.60 cm    Diastology LVIDs:         2.90 cm   LV e' medial:    8.38 cm/s LV PW:         1.00 cm   LV E/e' medial:  10.4 LV IVS:        1.00 cm   LV e' lateral:   9.36 cm/s LVOT diam:     2.00 cm   LV E/e' lateral: 9.3 LV SV:         59 LV SV Index:   29 LVOT Area:     3.14 cm  RIGHT VENTRICLE             IVC RV S prime:     18.30 cm/s  IVC diam: 1.70 cm TAPSE (M-mode): 2.4 cm LEFT ATRIUM             Index        RIGHT ATRIUM           Index LA diam:        2.90 cm 1.42 cm/m   RA Area:     12.50 cm LA Vol (A2C):   37.3 ml 18.32 ml/m  RA Volume:   26.80 ml  13.17 ml/m LA Vol (A4C):   23.6 ml 11.59 ml/m LA Biplane Vol: 31.0 ml 15.23 ml/m  AORTIC VALVE LVOT Vmax:   91.20 cm/s LVOT Vmean:  58.700 cm/s LVOT VTI:    0.187 m  AORTA Ao Root diam: 2.70 cm Ao Asc diam:  3.50 cm MITRAL VALVE MV Area (PHT): 3.65 cm    SHUNTS MV Decel Time:  208 msec    Systemic VTI:  0.19 m MV E velocity: 87.50 cm/s  Systemic Diam: 2.00 cm MV A velocity: 94.30 cm/s MV E/A ratio:  0.93 Gaylyn Keas MD Electronically signed by Gaylyn Keas MD Signature Date/Time: 01/20/2024/12:47:29 PM    Final    MR BRAIN WO CONTRAST Result Date: 01/20/2024 CLINICAL DATA:  Stroke follow-up EXAM: MRI HEAD WITHOUT CONTRAST TECHNIQUE: Multiplanar, multiecho pulse sequences of the brain and surrounding structures were obtained without intravenous contrast. COMPARISON:  01/18/2023 FINDINGS: Brain: No acute infarct, mass effect or extra-axial collection. No acute or chronic hemorrhage. There is multifocal hyperintense T2-weighted signal within the white matter. Generalized volume loss. The midline structures are normal. Vascular: Normal flow voids. Skull and upper cervical spine: Normal calvarium and skull base. Visualized upper cervical spine and soft tissues are normal. Sinuses/Orbits:No paranasal sinus fluid levels or advanced mucosal thickening. No mastoid or middle ear effusion. Normal orbits. IMPRESSION: 1. No acute intracranial abnormality. 2. Findings of chronic  small vessel ischemia and volume loss. Electronically Signed   By: Juanetta Nordmann M.D.   On: 01/20/2024 01:30   CT ANGIO HEAD NECK W WO CM (CODE STROKE) Result Date: 01/19/2024 CLINICAL DATA:  Stroke, follow up.  Left-sided weakness. EXAM: CT ANGIOGRAPHY HEAD AND NECK WITH AND WITHOUT CONTRAST TECHNIQUE: Multidetector CT imaging of the head and neck was performed using the standard protocol during bolus administration of intravenous contrast. Multiplanar CT image reconstructions and MIPs were obtained to evaluate the vascular anatomy. Carotid stenosis measurements (when applicable) are obtained utilizing NASCET criteria, using the distal internal carotid diameter as the denominator. RADIATION DOSE REDUCTION: This exam was performed according to the departmental dose-optimization program which includes automated exposure control, adjustment of the mA and/or kV according to patient size and/or use of iterative reconstruction technique. CONTRAST:  75mL OMNIPAQUE  IOHEXOL  350 MG/ML SOLN COMPARISON:  Head CT earlier today and MRI 01/18/2023 FINDINGS: CT HEAD FINDINGS Brain: There is no evidence of an acute infarct, intracranial hemorrhage, mass, midline shift, or extra-axial fluid collection. Cerebral volume is within normal limits. The ventricles are normal in size. Cerebral white matter hypodensities are unchanged and nonspecific but compatible with mild chronic small vessel ischemic disease. Vascular: No hyperdense vessel. Skull: No fracture or suspicious lesion. Sinuses/Orbits: Small mucous retention cyst in the left maxillary sinus. Clear mastoid air cells. Unremarkable orbits. Other: None. Review of the MIP images confirms the above findings CTA NECK FINDINGS Aortic arch: Standard branching. Widely patent brachiocephalic and subclavian arteries. Right carotid system: Patent without evidence of stenosis, dissection, or significant atherosclerosis. Left carotid system: Patent without evidence of stenosis,  dissection, or significant atherosclerosis. Vertebral arteries: Patent and codominant without evidence of stenosis, dissection, or significant atherosclerosis. Skeleton: No acute osseous abnormality or suspicious lesion. Other neck: No evidence of cervical lymphadenopathy or mass. Upper chest: Clear lung apices. Review of the MIP images confirms the above findings CTA HEAD FINDINGS Anterior circulation: The internal carotid arteries are widely patent from skull base to carotid termini. ACAs and MCAs are patent without evidence of a proximal branch occlusion or significant proximal stenosis. No aneurysm is identified. Posterior circulation: The intracranial vertebral arteries are widely patent to the basilar. Patent AICA and SCA origins are visualized bilaterally. The basilar artery is widely patent. There is a large right posterior communicating artery with hypoplasia of the right P1 segment. Both PCAs are patent without evidence of a significant proximal stenosis. No aneurysm is identified. Venous sinuses: As permitted by contrast timing, patent. Anatomic  variants: Significant fetal type supply of the right PCA. Review of the MIP images confirms the above findings These results were communicated to Dr. Arora at 4:04 pm on 01/19/2024 by text page via the [facility] messaging system. IMPRESSION: 1. No evidence of acute intracranial abnormality. 2. No large vessel occlusion or significant stenosis in the head and neck. Electronically Signed   By: Aundra Lee M.D.   On: 01/19/2024 16:10   CT HEAD CODE STROKE WO CONTRAST Result Date: 01/19/2024 CLINICAL DATA:  Code stroke.  Neuro deficit, left-sided weakness. EXAM: CT HEAD WITHOUT CONTRAST TECHNIQUE: Contiguous axial images were obtained from the base of the skull through the vertex without intravenous contrast. RADIATION DOSE REDUCTION: This exam was performed according to the departmental dose-optimization program which includes automated exposure control, adjustment of  the mA and/or kV according to patient size and/or use of iterative reconstruction technique. COMPARISON:  MRI head 01/18/2023. FINDINGS: Brain: No acute intracranial hemorrhage. No CT evidence of acute infarct. Nonspecific hypoattenuation in the periventricular and subcortical white matter favored to reflect chronic microvascular ischemic changes. No edema, mass effect, or midline shift. The basilar cisterns are patent. Ventricles: The ventricles are normal. Vascular: No hyperdense vessel or unexpected calcification. Skull: No acute or aggressive finding. Orbits: Orbits are symmetric. Sinuses: The visualized paranasal sinuses are clear. Other: Mastoid air cells are clear. ASPECTS [facility] Stroke Program Early CT Score) - Ganglionic level infarction (caudate, lentiform nuclei, internal capsule, insula, M1-M3 cortex): 7 - Supraganglionic infarction (M4-M6 cortex): 3 Total score (0-10 with 10 being normal): 10 IMPRESSION: 1. No CT evidence of acute intracranial abnormality. 2. Mild chronic microvascular ischemic changes. 3. ASPECTS is 10 These results were communicated to Dr. Arora at 3:21 pm on 01/19/2024 by text page via the [facility] messaging system. Electronically Signed   By: Denny Flack M.D.   On: 01/19/2024 15:21    Labs:  Basic Metabolic Panel: Recent Labs  Lab 02/03/24 0601 02/04/24 2052 02/07/24 1259  NA 137 135 138  K 4.2 4.0 3.8  CL 103 104 103  CO2 22 20* 25  GLUCOSE 122* 122* 92  BUN 32* 37* 24*  CREATININE 1.17* 1.49* 0.93  CALCIUM  9.6 9.7 10.1    CBC:     Latest Ref Rng & Units 01/31/2024    5:18 AM 01/29/2024    4:55 AM 01/22/2024   12:28 PM  CBC  WBC 4.0 - 10.5 K/uL 6.2  5.8  5.2   Hemoglobin 12.0 - 15.0 g/dL 16.1  09.6  04.5   Hematocrit 36.0 - 46.0 % 38.4  38.4  44.7   Platelets 150 - 400 K/uL 412  394  393       Brief HPI:   Jackie Reed is a 58 y.o. female with PMH of hypertension, sarcoidosis, hepatitis, migraines, low back pain, latent TB who was admitted on 01/19/2024  with sudden onset of left upper extremity and left lower extremity weakness and headache.  Imaging did not identify any occlusion, she received TNK and was treated hypertensive crisis.  MRI brain negative but left sided deficits persisted.  EEG was negative for seizure neurology question whether hypertensive encephalopathy versus conversion disorder due to significant extremity LUE and LLE giveway weakness and positive Hoover signs.  Low-dose ASA initiated plans to continue after discharge.  Crestor  added for LDL 84 with goal <70. Therapy evaluations completed due to patient decreased functional mobility and was admitted for a comprehensive rehab program.  Hospital Course: Jackie Reed was admitted  to rehab 01/28/2024 for inpatient therapies to consist of PT, ST and OT at least three hours five days a week. Past admission physiatrist, therapy team and rehab RN have worked together to provide customized collaborative inpatient rehab.  Pertaining to hypertensive encephalopathy. Blood pressures were monitored on TID basis. Amlodipine  and hydrochlorothiazide  initiated and blood pressure normalized,. Hypokalemia resolved after replacement with K+ PO patient will need to follow-up outpatient. Lovenox  was discontinued as patient is ambulating 85 feet CGA with PT. INH and pyridoxine  was continued for latent TB, follow up with ID.   Patient complained of new onset of hematuria and bladder pain, UTI treated with antibiotics. A KUB was ordered showing moderate stool burden, bowel regimen enhanced with moderate results. Unfortunately abdominal pain persisted leading to  CT abdomen pelvis due to severe pain with radiation. Studies showed severe constipation and at least partial right side UPJ obstruction. Urology consulted on 02/04/24, Lasix renogram on 02/07/24 revealed 52% L 48% R equal percentages of excretion without actionable obstruction follow up with Dr. Clarke Crouch. Again aggressive bowel regimen was recommended, stool  burden continued to improve based on output. Chronic lumbar facet syndrome was managed with current regimen with addition of baclofen  for muscle spasm. Microcytic anemia remained stable.    Rehab course: During patient's stay in rehab weekly team conferences were held to monitor patient's progress, set goals and discuss barriers to discharge. At admission, patient required min to mod assist overall with SLUMS scor 21/30, tremors present with increased activity and intermittent ataxic movements.  He/She  has had improvement in activity tolerance, balance, postural control as well as ability to compensate for deficits. He/She has had improvement in functional use RUE/LUE  and RLE/LLE as well as improvement in awareness. Pt to discharge at an ambulatory level modified independent with RW. Improved activity, improved balance, postural control and functional use of LUE and LLE.  Pt received instructions for medication adherence with antihypertensives, potassium replacements, and bowel management. Family teaching completed upon discharge to home.      Disposition:  Discharge disposition: 01-Home or Self Care        Diet: Regular Diet   Special Instructions: No drinking alcohol and No Driving.  2.  Continue bowel regimen with   Discharge Instructions     Ambulatory referral to Neurology   Complete by: As directed    An appointment is requested in approximately: 6 weeks   Ambulatory referral to Physical Medicine Rehab   Complete by: As directed       Allergies as of 02/08/2024       Reactions   Amoxil  [amoxicillin ] Shortness Of Breath, Palpitations   Morphine  And Codeine Hives, Rash   Ultram  [tramadol ] Nausea And Vomiting        Medication List     STOP taking these medications    docusate sodium  100 MG capsule Commonly known as: COLACE   fluticasone  furoate-vilanterol 100-25 MCG/ACT Aepb Commonly known as: Breo Ellipta  Replaced by: budesonide -formoterol  80-4.5 MCG/ACT  inhaler   ibuprofen  800 MG tablet Commonly known as: ADVIL        TAKE these medications    acetaminophen  325 MG tablet Commonly known as: TYLENOL  Take 1-2 tablets (325-650 mg total) by mouth every 4 (four) hours as needed for mild pain (pain score 1-3). What changed:  how much to take when to take this reasons to take this   alum & mag hydroxide-simeth 200-200-20 MG/5ML suspension Commonly known as: MAALOX/MYLANTA Take 30 mLs by mouth every 4 (four) hours  as needed for indigestion.   amLODipine  10 MG tablet Commonly known as: NORVASC  Take 1 tablet (10 mg total) by mouth daily.   aspirin  EC 81 MG tablet Take 1 tablet (81 mg total) by mouth daily. Swallow whole.   baclofen  10 MG tablet Commonly known as: LIORESAL  Take 1 tablet (10 mg total) by mouth 3 (three) times daily as needed for muscle spasms.   bisacodyl  10 MG suppository Commonly known as: DULCOLAX Place 1 suppository (10 mg total) rectally daily as needed for moderate constipation.   budesonide -formoterol  80-4.5 MCG/ACT inhaler Commonly known as: SYMBICORT  Inhale 1 puff into the lungs daily. Replaces: fluticasone  furoate-vilanterol 100-25 MCG/ACT Aepb   diclofenac  Sodium 1 % Gel Commonly known as: VOLTAREN  Apply 2 g topically 4 (four) times daily.   hydrochlorothiazide  25 MG tablet Commonly known as: HYDRODIURIL  Take 1 tablet (25 mg total) by mouth daily. What changed:  how much to take when to take this   isoniazid  300 MG tablet Commonly known as: NYDRAZID  Take 1 tablet (300 mg total) by mouth in the morning.   oxyCODONE  5 MG immediate release tablet Commonly known as: Oxy IR/ROXICODONE  Take 1 tablet (5 mg total) by mouth every 6 (six) hours as needed for severe pain (pain score 7-10). Notes to patient: Limit to 1 tablet daily as needed for severe pain    polyethylene glycol powder 17 GM/SCOOP powder Commonly known as: GLYCOLAX/MIRALAX Take 17 g by mouth 2 (two) times daily.   potassium  chloride SA 20 MEQ tablet Commonly known as: KLOR-CON  M Take 1 tablet (20 mEq total) by mouth daily.   pyridOXINE  50 MG tablet Commonly known as: B-6 Take 1 tablet (50 mg total) by mouth daily.   rosuvastatin  20 MG tablet Commonly known as: CRESTOR  Take 1 tablet (20 mg total) by mouth daily.   senna-docusate 8.6-50 MG tablet Commonly known as: Senokot-S Take 2 tablets by mouth 2 (two) times daily.        Follow-up Information     Aleta Anda, MD Follow up.   Specialty: Family Medicine Why: Call in 1-2 days for post hospital follow up Contact information: [address] [place] [address] [phone]         Liam Redhead, MD Follow up.   Specialty: Physical Medicine and Rehabilitation Why: office will call you with follow up appointment Contact information: [address] [place] [address] [phone]         [facility] Follow up.   Why: office will call you with follow up appointment Contact information: [address]     [address] [place]  [address] [phone]        Orlie Bjornstad, MD Follow up.   Specialty: Infectious Diseases Why: Appointment on 02/10/24 at 8:45, Office will call for appointment Contact information: [address] [place] [address] [phone]         Erman Hayward, MD Follow up.   Specialty: Urology Why: Call for an appointment. Contact information: [address] [place] [address] [phone]                 Signed: Trina Fujita Timoty Bourke 02/08/2024, 10:26 AM

## 2024-02-04 NOTE — Progress Notes (Signed)
 Occupational Therapy Weekly Progress Note  Patient Details  Name: Jackie Reed MRN: [id number] Date of Birth: Dec 26, 1965  Beginning of progress report period: January 29, 2024 End of progress report period: February 04, 2024  Today's Date: 02/04/2024 OT Individual Time: 1000-1100 OT Individual Time Calculation (min): 60 min    Patient has shown significant improvement in all aspects of BADL, and functional mobility due to improved motor control, reduced ataxia, improved coordination, improved activity tolerance, improved functional use of dominant left side, and improved balance.    Patient continues to demonstrate the following deficits: ataxia and therefore will continue to benefit from skilled OT intervention to enhance overall performance with BADL and Reduce care partner burden.  Patient progressing toward long term goals..  Continue plan of care.  OT Short Term Goals Week 1:  OT Short Term Goal 1 (Week 1): See LTG's due to ELOS - patient is progressing well toward all LTG's.   Week 2:  OT Short Term Goal 1 (Week 2): STG=LTG due to LOS OT Short Term Goal 2 (Week 2): Patient will demonstrate use of Left UE as dominant for BADL, and handwriting  Skilled Therapeutic Interventions/Progress Updates:    Patient received seated in recliner - nurse delivering medications.  Patient eager for shower.  Patient gathered items for shower, had clothing out for today.  Patient walked without walker to shower and bathed self with mod I while seated.  Patient has shower seat at home and plans to sit for shower after discharge.  Patient dressed herself - including bra and tying shoes.   Patient walked to sink to complete hygiene tasks.    Went to gift shop to address walking in congested area without her walker.  Patient able to peruse items in store, reach to floor, reach overhead, choose several items to purchase and walk back to room.  She opted to use walker for longer walks.  Patient left up in recliner  chair at end of session with personal items nearby.    Therapy Documentation Precautions:  Precautions Precautions: Fall Recall of Precautions/Restrictions: Intact Restrictions Weight Bearing Restrictions Per Provider Order: No  Pain:  Denies pain.  Had severe pain last night and sent for CT scan.   Therapy/Group: Individual Therapy  Sarit Sparano M 02/04/2024, 10:28 AM

## 2024-02-05 DIAGNOSIS — G9340 Encephalopathy, unspecified: Secondary | ICD-10-CM | POA: Diagnosis not present

## 2024-02-05 DIAGNOSIS — R310 Gross hematuria: Secondary | ICD-10-CM

## 2024-02-05 DIAGNOSIS — N1330 Unspecified hydronephrosis: Secondary | ICD-10-CM | POA: Diagnosis not present

## 2024-02-05 DIAGNOSIS — G894 Chronic pain syndrome: Secondary | ICD-10-CM

## 2024-02-05 DIAGNOSIS — M47816 Spondylosis without myelopathy or radiculopathy, lumbar region: Secondary | ICD-10-CM

## 2024-02-05 NOTE — Plan of Care (Signed)
  Problem: Consults Goal: RH STROKE PATIENT EDUCATION Description: See Patient Education module for education specifics  Outcome: Progressing   Problem: RH BOWEL ELIMINATION Goal: RH STG MANAGE BOWEL WITH ASSISTANCE Description: STG Manage Bowel with toileting Assistance. Outcome: Progressing Goal: RH STG MANAGE BOWEL W/MEDICATION W/ASSISTANCE Description: STG Manage Bowel with Medication with mod I Assistance. Outcome: Progressing   Problem: RH SAFETY Goal: RH STG ADHERE TO SAFETY PRECAUTIONS W/ASSISTANCE/DEVICE Description: STG Adhere to Safety Precautions With cues Assistance/Device. Outcome: Progressing   Problem: RH PAIN MANAGEMENT Goal: RH STG PAIN MANAGED AT OR BELOW PT'S PAIN GOAL Description: Pain < 4 with prns Outcome: Progressing   Problem: RH KNOWLEDGE DEFICIT Goal: RH STG INCREASE KNOWLEDGE OF DIABETES Description: Patient and spouse will be able to manage prediabetes using educational resources for dietary modification independently Outcome: Progressing Goal: RH STG INCREASE KNOWLEDGE OF HYPERTENSION Description: Patient and spouse will be able to manage HTN using educational resources for medications and dietary modification independently Outcome: Progressing Goal: RH STG INCREASE KNOWLEGDE OF HYPERLIPIDEMIA Description: Patient and spouse will be able to manage HLD using educational resources for medications anddietary modification independently Outcome: Progressing Goal: RH STG INCREASE KNOWLEDGE OF STROKE PROPHYLAXIS Description: Patient and spouse will be able to manage secondary risks using educational resources for medications and dietary modification independently Outcome: Progressing

## 2024-02-05 NOTE — Progress Notes (Signed)
 Urology Consult Follow Up  Subjective: No flank pain yesterday and having minimal pain today Due to shortage of radioisotope Lasix renal scan cannot be performed until 6/16.  Objective: Vital signs in last 24 hours: Temp:  [97.7 F (36.5 C)-98 F (36.7 C)] 97.7 F (36.5 C) (06/14 1356) Pulse Rate:  [72-74] 74 (06/14 1356) Resp:  [17-18] 17 (06/14 1356) BP: (123-133)/(63-73) 123/63 (06/14 1356) SpO2:  [98 %-100 %] 100 % (06/14 1356)  Intake/Output from previous day: 06/13 0701 - 06/14 0700 In: 720 [P.O.:720] Out: -  Intake/Output this shift: Total I/O In: -  Out: 100 [Urine:100]   Physical Exam Alert, NAD  Lab Results:   BMET Recent Labs    02/03/24 0601 02/04/24 2052  NA 137 135  K 4.2 4.0  CL 103 104  CO2 22 20*  GLUCOSE 122* 122*  BUN 32* 37*  CREATININE 1.17* 1.49*  CALCIUM  9.6 9.7     Assessment:  1.  Right hydronephrosis Probable UPJ obstruction Minimal symptoms past 48 hours Lasix renogram pending  Plan: Lasix renogram as above     LOS: 8 days    Jackie Reed [facility] 02/05/2024

## 2024-02-05 NOTE — Progress Notes (Signed)
 PROGRESS NOTE   Subjective/Complaints: Pt without complaints this morning. Slept well.  Mentioned the initial plan for renal scan this morning---now will be Monday?? Moved bowels yesterday and this morning. Urine clear  ROS: Patient denies fever, rash, sore throat, blurred vision, dizziness, nausea, vomiting, diarrhea, cough, shortness of breath or chest pain, joint or back/neck pain, headache, or mood change.    Objective:   CT ABDOMEN PELVIS W CONTRAST Result Date: 02/03/2024 CLINICAL DATA:  Flank pain EXAM: CT ABDOMEN AND PELVIS WITH CONTRAST TECHNIQUE: Multidetector CT imaging of the abdomen and pelvis was performed using the standard protocol following bolus administration of intravenous contrast. RADIATION DOSE REDUCTION: This exam was performed according to the departmental dose-optimization program which includes automated exposure control, adjustment of the mA and/or kV according to patient size and/or use of iterative reconstruction technique. CONTRAST:  75mL OMNIPAQUE  IOHEXOL  350 MG/ML SOLN COMPARISON:  Renal ultrasound 02/03/2024 and PET/CT 01/29/2023 FINDINGS: Lower chest: No acute abnormality. Hepatobiliary: Unremarkable liver. Normal gallbladder. No biliary dilation. Pancreas: Unremarkable. Spleen: Unremarkable. Adrenals/Urinary Tract: Normal adrenal glands. Delayed right nephrogram mild right hydroureteronephrosis upstream from a abrupt transition point in the proximal right ureter in an area of kinking (series 5/image 62). No obstructing stone. Bladder is unremarkable. Stomach/Bowel: Normal caliber large and small bowel. No bowel wall thickening. The appendix is normal.Stomach is within normal limits. Vascular/Lymphatic: No significant vascular findings are present. No enlarged abdominal or pelvic lymph nodes. Reproductive: Hysterectomy.  No adnexal mass. Other: No free intraperitoneal fluid or air. Musculoskeletal: No acute  fracture. IMPRESSION: Findings compatible with at least partial right UPJ obstruction. No obstructing stone. This was not present on 01/29/2023 and may be due to a variety of causes including scar tissue, stricture from infection, a crossing blood vessel, or urothelial malignancy. Consider Lasix renal scan for further evaluation. Electronically Signed   By: Rozell Cornet M.D.   On: 02/03/2024 22:16   No results for input(s): WBC, HGB, HCT, PLT in the last 72 hours.  Recent Labs    02/03/24 0601 02/04/24 2052  NA 137 135  K 4.2 4.0  CL 103 104  CO2 22 20*  GLUCOSE 122* 122*  BUN 32* 37*  CREATININE 1.17* 1.49*  CALCIUM  9.6 9.7     Intake/Output Summary (Last 24 hours) at 02/05/2024 1123 Last data filed at 02/05/2024 1100 Gross per 24 hour  Intake 480 ml  Output 100 ml  Net 380 ml         Physical Exam: Vital Signs Blood pressure 133/73, pulse 72, temperature 98 F (36.7 C), temperature source Oral, resp. rate 18, height 5' 4 (1.626 m), weight 96.2 kg, SpO2 98%.   Constitutional: No distress . Vital signs reviewed. obese HEENT: NCAT, EOMI, oral membranes moist Neck: supple Cardiovascular: RRR without murmur. No JVD    Respiratory/Chest: CTA Bilaterally without wheezes or rales. Normal effort    GI/Abdomen: BS +, non-tender, non-distended Ext: no clubbing, cyanosis, or edema Psych: pleasant and cooperative  Skin: No evidence of breakdown, no evidence of rash Neurologic: awoke fairly easily. Reasonable insight and awareness. Cranial nerves II through XII intact MMT:  5/5 right sided 4/5 left side Sensory exam normal sensation  to light touch and proprioception in bilateral upper and lower extremities Cerebellar exam intention tremor with L FNF  Musculoskeletal: no pain with AAROM but limited exam. No joint swelling   Assessment/Plan: 1. Functional deficits which require 3+ hours per day of interdisciplinary therapy in a comprehensive inpatient rehab  setting. Physiatrist is providing close team supervision and 24 hour management of active medical problems listed below. Physiatrist and rehab team continue to assess barriers to discharge/monitor patient progress toward functional and medical goals  Care Tool:  Bathing    Body parts bathed by patient: Left arm, Chest, Abdomen, Front perineal area, Right upper leg, Left upper leg, Face, Right arm, Buttocks, Left lower leg, Right lower leg   Body parts bathed by helper: Buttocks, Right arm, Right lower leg, Left lower leg     Bathing assist Assist Level: Independent with assistive device     Upper Body Dressing/Undressing Upper body dressing   What is the patient wearing?: Bra, Pull over shirt    Upper body assist Assist Level: Independent    Lower Body Dressing/Undressing Lower body dressing      What is the patient wearing?: Underwear/pull up, Pants     Lower body assist Assist for lower body dressing: Independent with assitive device     Toileting Toileting    Toileting assist Assist for toileting: Supervision/Verbal cueing     Transfers Chair/bed transfer  Transfers assist  Chair/bed transfer activity did not occur: Safety/medical concerns  Chair/bed transfer assist level: Supervision/Verbal cueing     Locomotion Ambulation   Ambulation assist      Assist level: Contact Guard/Touching assist Assistive device: No Device Max distance: 100   Walk 10 feet activity   Assist     Assist level: Contact Guard/Touching assist Assistive device: No Device   Walk 50 feet activity   Assist Walk 50 feet with 2 turns activity did not occur: Safety/medical concerns  Assist level: Contact Guard/Touching assist Assistive device: No Device    Walk 150 feet activity   Assist Walk 150 feet activity did not occur: Safety/medical concerns         Walk 10 feet on uneven surface  activity   Assist Walk 10 feet on uneven surfaces activity did not  occur: Safety/medical concerns   Assist level: Supervision/Verbal cueing Assistive device: Walker-rolling   Wheelchair     Assist Is the patient using a wheelchair?: Yes Type of Wheelchair: Manual    Wheelchair assist level: Dependent - Patient 0%      Wheelchair 50 feet with 2 turns activity    Assist        Assist Level: Dependent - Patient 0%   Wheelchair 150 feet activity     Assist      Assist Level: Dependent - Patient 0%   Blood pressure 133/73, pulse 72, temperature 98 F (36.7 C), temperature source Oral, resp. rate 18, height 5' 4 (1.626 m), weight 96.2 kg, SpO2 98%.  Medical Problem List and Plan: 1. Functional deficits secondary to left hemiparesis secondary to hypertensive encephalopathy versus conversion disorder             -patient may shower             -ELOS/Goals: 7 to 10 days, supervision PT/OT, mod I SLP             - Grounds pass ordered  -IPOC completed  -Continue CIR therapies including PT, OT, and SLP   2.  Antithrombotics: -DVT/anticoagulation:  Pharmaceutical:  Lovenox - may d/c amb 45' CGA with PT             -antiplatelet therapy: continue asa  3. Hematuria: UA ordered. Bactrim  started given positive UA, will stop enoxaparin  due to improved ambulation which should help with hematuria Discussed UPJ stenosis on CT scan, urology consulted UC ordered  -6/14 plan was for lasix renal scan today. It appears that the test may now take place Monday?  I see no update to plan in today's records but pt seemed aware this morning  -urine clear this morning  4. Constipation: resolved with enema, last BM 6/12  5. Neuropsych/cognition: This patient is capable of making decisions on her own behalf.  6. Abdominal pain: Lidocaine  patch ordered, see #3, continue oxycodone   7. Fluids/Electrolytes/Nutrition: Monitor I/O. Check CMET in am  8. HTN: Monitor BP TID--continue amlodipine  and HCTX Vitals:   02/04/24 1505 02/05/24 0504  BP: (!)  107/59 133/73  Pulse: 79 72  Resp: 18 18  Temp: 98.1 F (36.7 C) 98 F (36.7 C)  SpO2: 96% 98%    9. Sarcoidosis/Dyspnea: On breo with improvement in cough. Follwed by Dr. Alver Jobs.  10. Latent TB: Continue INH and pyridoxine  daily. Educate on importance of compliance.  11. Chronic Lumbar facet syndrome/Lumbar radiculopathy: Managed with local measures and s/p median branch block 07/2023 per [facility]               --Voltaren  gel added.              - Reviewed chart; similar presentation to her pain management presentations since 2020.  Has failed cyclobenzaprine  (Flexeril ), gabapentin  (Neurontin , Gralise ), Reports mild benefit with use of Diclofenac  but not has not used recently.              - Lumbar MRI 09/21/23:  1. Mild anterolisthesis of L4-L5 with possible bilateral pars defects at this level.  2. Multilevel lumbar spondylosis, most pronounced at the L4-L5 and L5-S1 levels with marked facet arthropathy. There is mild spinal canal stenosis and subarticular recess narrowing at L4-L5 with disc material likely contacting the bilateral descending L5 nerve roots as described.  3. No substantial foraminal stenosis at any of the lumbar levels.                - Add baclofen  10 mg 3 times daily as needed for muscle spasms   6/14 continue current regimen for pain control  12. Hypokalemia: K+ reviewed and has normalized     Latest Ref Rng & Units 02/04/2024    8:52 PM 02/03/2024    6:01 AM 01/29/2024    4:55 AM  BMP  Glucose 70 - 99 mg/dL 829  562  130   BUN 6 - 20 mg/dL 37  32  22   Creatinine 0.44 - 1.00 mg/dL 8.65  7.84  6.96   Sodium 135 - 145 mmol/L 135  137  134   Potassium 3.5 - 5.1 mmol/L 4.0  4.2  4.2   Chloride 98 - 111 mmol/L 104  103  101   CO2 22 - 32 mmol/L 20  22  26    Calcium  8.9 - 10.3 mg/dL 9.7  9.6  9.0     13. Class 2 obesity: BMI 37.7. Educated on importance of weight loss and mobility to help promote overall health.    14. Microcytic anemia: iron  studies reviewed and are stable LOS: 8 days A FACE TO FACE EVALUATION WAS PERFORMED  Raechel Bulla T  Rachel Budds 02/05/2024, 11:23 AM

## 2024-02-06 DIAGNOSIS — G894 Chronic pain syndrome: Secondary | ICD-10-CM | POA: Diagnosis not present

## 2024-02-06 DIAGNOSIS — M47816 Spondylosis without myelopathy or radiculopathy, lumbar region: Secondary | ICD-10-CM | POA: Diagnosis not present

## 2024-02-06 DIAGNOSIS — G9340 Encephalopathy, unspecified: Secondary | ICD-10-CM | POA: Diagnosis not present

## 2024-02-06 DIAGNOSIS — R310 Gross hematuria: Secondary | ICD-10-CM | POA: Diagnosis not present

## 2024-02-06 LAB — URINE CULTURE

## 2024-02-06 NOTE — Progress Notes (Signed)
 PROGRESS NOTE   Subjective/Complaints: Pt feeling fairly well this morning. Minimal discomfort. Urine has been clear.   ROS: Patient denies fever, rash, sore throat, blurred vision, dizziness, nausea, vomiting, diarrhea, cough, shortness of breath or chest pain, joint or back/neck pain, headache, or mood change.    Objective:   No results found.  No results for input(s): WBC, HGB, HCT, PLT in the last 72 hours.  Recent Labs    02/04/24 2052  NA 135  K 4.0  CL 104  CO2 20*  GLUCOSE 122*  BUN 37*  CREATININE 1.49*  CALCIUM  9.7     Intake/Output Summary (Last 24 hours) at 02/06/2024 4098 Last data filed at 02/05/2024 1752 Gross per 24 hour  Intake 237 ml  Output 100 ml  Net 137 ml         Physical Exam: Vital Signs Blood pressure 124/68, pulse 73, temperature (!) 97.5 F (36.4 C), resp. rate 16, height 5' 4 (1.626 m), weight 96.2 kg, SpO2 98%.   Constitutional: No distress . Vital signs reviewed. HEENT: NCAT, EOMI, oral membranes moist Neck: supple Cardiovascular: RRR without murmur. No JVD    Respiratory/Chest: CTA Bilaterally without wheezes or rales. Normal effort    GI/Abdomen: BS +, non-tender, non-distended Ext: no clubbing, cyanosis, or edema Psych: pleasant and cooperative  Skin: No evidence of breakdown, no evidence of rash Neurologic: alert and oriented. Normal insight and awareness. Cranial nerves II through XII intact MMT:  5/5 right sided 4/5 left side Sensory exam normal sensation to light touch and proprioception in bilateral upper and lower extremities Cerebellar exam intention tremor with L FNF  Musculoskeletal: no pain with ROM.Aaron Aas No joint swelling   Assessment/Plan: 1. Functional deficits which require 3+ hours per day of interdisciplinary therapy in a comprehensive inpatient rehab setting. Physiatrist is providing close team supervision and 24 hour management of active  medical problems listed below. Physiatrist and rehab team continue to assess barriers to discharge/monitor patient progress toward functional and medical goals  Care Tool:  Bathing    Body parts bathed by patient: Left arm, Chest, Abdomen, Front perineal area, Right upper leg, Left upper leg, Face, Right arm, Buttocks, Left lower leg, Right lower leg   Body parts bathed by helper: Buttocks, Right arm, Right lower leg, Left lower leg     Bathing assist Assist Level: Independent with assistive device     Upper Body Dressing/Undressing Upper body dressing   What is the patient wearing?: Bra, Pull over shirt    Upper body assist Assist Level: Independent    Lower Body Dressing/Undressing Lower body dressing      What is the patient wearing?: Underwear/pull up, Pants     Lower body assist Assist for lower body dressing: Independent with assitive device     Toileting Toileting    Toileting assist Assist for toileting: Supervision/Verbal cueing     Transfers Chair/bed transfer  Transfers assist  Chair/bed transfer activity did not occur: Safety/medical concerns  Chair/bed transfer assist level: Supervision/Verbal cueing     Locomotion Ambulation   Ambulation assist      Assist level: Contact Guard/Touching assist Assistive device: No Device Max distance: 100  Walk 10 feet activity   Assist     Assist level: Contact Guard/Touching assist Assistive device: No Device   Walk 50 feet activity   Assist Walk 50 feet with 2 turns activity did not occur: Safety/medical concerns  Assist level: Contact Guard/Touching assist Assistive device: No Device    Walk 150 feet activity   Assist Walk 150 feet activity did not occur: Safety/medical concerns         Walk 10 feet on uneven surface  activity   Assist Walk 10 feet on uneven surfaces activity did not occur: Safety/medical concerns   Assist level: Supervision/Verbal cueing Assistive device:  Walker-rolling   Wheelchair     Assist Is the patient using a wheelchair?: Yes Type of Wheelchair: Manual    Wheelchair assist level: Dependent - Patient 0%      Wheelchair 50 feet with 2 turns activity    Assist        Assist Level: Dependent - Patient 0%   Wheelchair 150 feet activity     Assist      Assist Level: Dependent - Patient 0%   Blood pressure 124/68, pulse 73, temperature (!) 97.5 F (36.4 C), resp. rate 16, height 5' 4 (1.626 m), weight 96.2 kg, SpO2 98%.  Medical Problem List and Plan: 1. Functional deficits secondary to left hemiparesis secondary to hypertensive encephalopathy versus conversion disorder             -patient may shower             -ELOS/Goals: 7 to 10 days, supervision PT/OT, mod I SLP             - Grounds pass ordered  -IPOC completed  -Continue CIR therapies including PT, OT, and SLP   2.  Antithrombotics: -DVT/anticoagulation:  Pharmaceutical: Lovenox - may d/c amb 6' CGA with PT             -antiplatelet therapy: continue asa  3. Hematuria: UA ordered. Bactrim  started given positive UA, will stop enoxaparin  due to improved ambulation which should help with hematuria Discussed UPJ stenosis on CT scan, urology consulted UC ordered  -6/15 lasix renal scan postponed until Monday due to shortage of dye -pt currently comfortable, urine clear -appreciate urology assist!.    4. Constipation: resolved with enema, last BM 6/12  5. Neuropsych/cognition: This patient is capable of making decisions on her own behalf.  6. Abdominal pain: Lidocaine  patch ordered, see #3, continue oxycodone   7. Fluids/Electrolytes/Nutrition: Monitor I/O. Check CMET in am  8. HTN: Monitor BP TID--continue amlodipine  and HCTX Vitals:   02/05/24 1955 02/06/24 0407  BP: 110/76 124/68  Pulse: 74 73  Resp: 17 16  Temp: 97.8 F (36.6 C) (!) 97.5 F (36.4 C)  SpO2: 100% 98%   6/15 bp controlled  9. Sarcoidosis/Dyspnea: On breo with  improvement in cough. Follwed by Dr. Alver Jobs.  10. Latent TB: Continue INH and pyridoxine  daily. Educate on importance of compliance.  11. Chronic Lumbar facet syndrome/Lumbar radiculopathy: Managed with local measures and s/p median branch block 07/2023 per [facility]               --Voltaren  gel added.              - Reviewed chart; similar presentation to her pain management presentations since 2020.  Has failed cyclobenzaprine  (Flexeril ), gabapentin  (Neurontin , Gralise ), Reports mild benefit with use of Diclofenac  but not has not used recently.              -  Lumbar MRI 09/21/23:  1. Mild anterolisthesis of L4-L5 with possible bilateral pars defects at this level.  2. Multilevel lumbar spondylosis, most pronounced at the L4-L5 and L5-S1 levels with marked facet arthropathy. There is mild spinal canal stenosis and subarticular recess narrowing at L4-L5 with disc material likely contacting the bilateral descending L5 nerve roots as described.  3. No substantial foraminal stenosis at any of the lumbar levels.                - Add baclofen  10 mg 3 times daily as needed for muscle spasms   6/15 continue current regimen for pain control  12. Hypokalemia: K+ reviewed and has normalized     Latest Ref Rng & Units 02/04/2024    8:52 PM 02/03/2024    6:01 AM 01/29/2024    4:55 AM  BMP  Glucose 70 - 99 mg/dL 409  811  914   BUN 6 - 20 mg/dL 37  32  22   Creatinine 0.44 - 1.00 mg/dL 7.82  9.56  2.13   Sodium 135 - 145 mmol/L 135  137  134   Potassium 3.5 - 5.1 mmol/L 4.0  4.2  4.2   Chloride 98 - 111 mmol/L 104  103  101   CO2 22 - 32 mmol/L 20  22  26    Calcium  8.9 - 10.3 mg/dL 9.7  9.6  9.0     13. Class 2 obesity: BMI 37.7. Educated on importance of weight loss and mobility to help promote overall health.    14. Microcytic anemia: iron studies reviewed and are stable   LOS: 9 days A FACE TO FACE EVALUATION WAS PERFORMED  Rawland Caddy 02/06/2024, 7:12 AM

## 2024-02-06 NOTE — Progress Notes (Signed)
 Occupational Therapy Session Note  Patient Details  Name: MODELLE VOLLMER MRN: [id number] Date of Birth: Mar 05, 1966  Today's Date: 02/06/2024 OT Individual Time: 0900-1000 OT Individual Time Calculation (min): 60 min    Short Term Goals: Week 1:  OT Short Term Goal 1 (Week 1): See LTG's due to ELOS  Skilled Therapeutic Interventions/Progress Updates:    Patient resting in bed at the time of arrival. The pt was able to come from supine in bed to EOB with close S.  The pt was able to ambulate from the closet to the restroom using the RW with CGA . The pt was able to doff her LB garment, shorts and underwear for placement on the commode with CloseS. The pt was able to complete toileting hyigene with closeS. The pt went from sit to stand using the grab bar and the RW for ambulating to the shower stall for placement on the shower chair with close S. The pt was able to doff her over head shirt with closeS, she was able to doff her LB garments shorts, panties,and socks with closeS.The pt bathe her UB/LB with close S  The pt was able to exit the shower using the grab bars and the RW for placement at chair LOF with closeS. The pt was able to apply deo, lotion, and powder with s/uA. The pt was able to donn her over her shirt and bra with s/uA. The pt was s/uA for donning her underwear, shorts, and non-skid socks. The pt was able to ambulate from the restroom to the sink area for brushing her teeth.  At the end of the session, the pt ambulated to the canteen and back to her room >300 ft using the RW for additional balance. Upon returning to the pt's room, she sat in  the recliner with the call light and bedside table within reach and all additional needs addressed.  Therapy Documentation Precautions:  Precautions Precautions: Fall Recall of Precautions/Restrictions: Intact Restrictions Weight Bearing Restrictions Per Provider Order: No  Therapy/Group: Individual Therapy  Moises Ang 02/06/2024, 3:48  PM

## 2024-02-07 ENCOUNTER — Inpatient Hospital Stay (HOSPITAL_COMMUNITY)

## 2024-02-07 DIAGNOSIS — I674 Hypertensive encephalopathy: Secondary | ICD-10-CM | POA: Diagnosis not present

## 2024-02-07 LAB — BASIC METABOLIC PANEL WITH GFR
Anion gap: 10 (ref 5–15)
BUN: 24 mg/dL — ABNORMAL HIGH (ref 6–20)
CO2: 25 mmol/L (ref 22–32)
Calcium: 10.1 mg/dL (ref 8.9–10.3)
Chloride: 103 mmol/L (ref 98–111)
Creatinine, Ser: 0.93 mg/dL (ref 0.44–1.00)
GFR, Estimated: >60 mL/min (ref >60)
Glucose, Bld: 92 mg/dL (ref 70–99)
Potassium: 3.8 mmol/L (ref 3.5–5.1)
Sodium: 138 mmol/L (ref 135–145)

## 2024-02-07 MED ORDER — FUROSEMIDE 10 MG/ML IJ SOLN
INTRAMUSCULAR | Status: AC
  Filled 2024-02-07: qty 4

## 2024-02-07 MED ORDER — ACETAMINOPHEN 325 MG PO TABS
325.0000 mg | ORAL_TABLET | ORAL | 0 refills | Status: AC | PRN
  Filled 2024-02-07: qty 30, 3d supply, fill #0

## 2024-02-07 MED ORDER — BUDESONIDE-FORMOTEROL FUMARATE 80-4.5 MCG/ACT IN AERO
1.0000 | INHALATION_SPRAY | Freq: Every day | RESPIRATORY_TRACT | 0 refills | Status: DC
  Filled 2024-02-07: qty 10.2, 30d supply, fill #0

## 2024-02-07 MED ORDER — ASPIRIN 81 MG PO TBEC
81.0000 mg | DELAYED_RELEASE_TABLET | Freq: Every day | ORAL | 0 refills | Status: DC
  Filled 2024-02-07: qty 30, 30d supply, fill #0

## 2024-02-07 MED ORDER — BISACODYL 10 MG RE SUPP
10.0000 mg | Freq: Every day | RECTAL | 0 refills | Status: DC | PRN
  Filled 2024-02-07: qty 12, 12d supply, fill #0

## 2024-02-07 MED ORDER — ROSUVASTATIN CALCIUM 20 MG PO TABS
20.0000 mg | ORAL_TABLET | Freq: Every day | ORAL | 0 refills | Status: DC
  Filled 2024-02-07: qty 30, 30d supply, fill #0

## 2024-02-07 MED ORDER — TECHNETIUM TC 99M MERTIATIDE
5.5000 | Freq: Once | INTRAVENOUS | Status: AC | PRN
  Administered 2024-02-07: 5.5 via INTRAVENOUS

## 2024-02-07 MED ORDER — OXYCODONE HCL 5 MG PO TABS
5.0000 mg | ORAL_TABLET | Freq: Four times a day (QID) | ORAL | 0 refills | Status: DC | PRN
Start: 2024-02-07 — End: 2024-03-20
  Filled 2024-02-07: qty 15, 4d supply, fill #0

## 2024-02-07 MED ORDER — BACLOFEN 10 MG PO TABS
10.0000 mg | ORAL_TABLET | Freq: Three times a day (TID) | ORAL | 0 refills | Status: DC | PRN
  Filled 2024-02-07: qty 30, 10d supply, fill #0

## 2024-02-07 MED ORDER — PYRIDOXINE HCL 50 MG PO TABS
50.0000 mg | ORAL_TABLET | Freq: Every day | ORAL | 0 refills | Status: DC
  Filled 2024-02-07: qty 30, 30d supply, fill #0

## 2024-02-07 MED ORDER — HYDROCHLOROTHIAZIDE 25 MG PO TABS
25.0000 mg | ORAL_TABLET | Freq: Every day | ORAL | 0 refills | Status: DC
  Filled 2024-02-07: qty 30, 30d supply, fill #0

## 2024-02-07 MED ORDER — AMLODIPINE BESYLATE 10 MG PO TABS
10.0000 mg | ORAL_TABLET | Freq: Every day | ORAL | 0 refills | Status: DC
Start: 2024-02-07 — End: 2024-02-08
  Filled 2024-02-07: qty 30, 30d supply, fill #0

## 2024-02-07 MED ORDER — POLYETHYLENE GLYCOL 3350 17 GM/SCOOP PO POWD
17.0000 g | Freq: Two times a day (BID) | ORAL | 0 refills | Status: DC
  Filled 2024-02-07: qty 238, 7d supply, fill #0

## 2024-02-07 MED ORDER — ISONIAZID 300 MG PO TABS
300.0000 mg | ORAL_TABLET | Freq: Every morning | ORAL | 0 refills | Status: DC
  Filled 2024-02-07: qty 30, 30d supply, fill #0

## 2024-02-07 MED ORDER — SENNOSIDES-DOCUSATE SODIUM 8.6-50 MG PO TABS
2.0000 | ORAL_TABLET | Freq: Two times a day (BID) | ORAL | 0 refills | Status: DC
  Filled 2024-02-07: qty 120, 30d supply, fill #0

## 2024-02-07 MED ORDER — DICLOFENAC SODIUM 1 % EX GEL
2.0000 g | Freq: Four times a day (QID) | CUTANEOUS | 0 refills | Status: AC
  Filled 2024-02-07: qty 100, 13d supply, fill #0

## 2024-02-07 MED ORDER — PYRIDOXINE HCL 50 MG PO TABS: 50.0000 mg | ORAL_TABLET | Freq: Every day | ORAL | 0 refills | Status: DC

## 2024-02-07 MED ORDER — POTASSIUM CHLORIDE CRYS ER 20 MEQ PO TBCR
20.0000 meq | EXTENDED_RELEASE_TABLET | Freq: Every day | ORAL | 0 refills | Status: DC
  Filled 2024-02-07: qty 30, 30d supply, fill #0

## 2024-02-07 MED ORDER — FUROSEMIDE 10 MG/ML IJ SOLN
INTRAMUSCULAR | Status: AC
Start: 2024-02-07 — End: 2024-02-07
  Filled 2024-02-07: qty 2

## 2024-02-07 NOTE — Progress Notes (Incomplete)
 Inpatient Rehabilitation Discharge Medication Review by a Pharmacist  A complete drug regimen review was completed for this patient to identify any potential clinically significant medication issues.  High Risk Drug Classes Is patient taking? Indication by Medication  Antipsychotic No   Anticoagulant No   Antibiotic Yes Isoniazid  - latent TB  Opioid Yes PRN Oxycodone  - severe pain  Antiplatelet Yes Aspirin  81 mg - CVA prophylaxis  Hypoglycemics/insulin No   Vasoactive Medication Yes Amlodipine , hydrochlorothiazide  - hypertension  Chemotherapy No   Other Yes Diclofenac  gel - topical pain relief  Symbicort  - sarcoidosis/dyspnea Potassium chloride  - supplement Pyridoxine  - peripheral neuropathy prophylaxis with Isoniazid  Rosuvastatin  - hyperlipidemia Miralax, Senna-docusate - laxatives  PRNs: Acetaminophen  - mild pain Baclofen  - muscle spamss Maalox - indigestion Bisacodyl  suppository - constipation     Type of Medication Issue Identified Description of Issue Recommendation(s)  Drug Interaction(s) (clinically significant)     Duplicate Therapy     Allergy     No Medication Administration End Date     Incorrect Dose     Additional Drug Therapy Needed     Significant med changes from prior encounter (inform family/care partners about these prior to discharge). New aspirin  and rosuvastatin  while inpatient.   Breo changed to Symbicort  at discharge.  Docusate changed to senna-docusate. Scheduled ibuprofen  discontinued. Communicate changes with patient/family prior to discharge.  Other       Clinically significant medication issues were identified that warrant physician communication and completion of prescribed/recommended actions by midnight of the next day:  No  Pharmacist comments:   Time spent performing this drug regimen review (minutes):  [address]   Adolphus Akin, [place] 02/07/2024 4:20 PM

## 2024-02-07 NOTE — Progress Notes (Signed)
 Occupational Therapy Discharge Summary  Patient Details  Name: Jackie Reed MRN: [id number] Date of Birth: [birth year]  Date of Discharge from OT service:February 07, 2024  Today's Date: 02/07/2024 OT Individual Time: 1000-1035 OT Individual Time Calculation (min): 35 min   Patient awake and alert in room awaiting procedure prior to discharge home tomorrow.  Reviewed all goals.  Patient has met and exceeded all OT goals, and OP will beneficial tohelp with return to work goals such as typing.  Simulated bathing/ dressing tasks as patient needs to stay in gown for procedure.  Patient has walked with therapy without a device, but with a device is Mod I.  Patient is pleased with her progress and eager for procedure to be completed, and to discharge home tomorrow.    Patient has met 11 of 11 long term goals due to improved activity tolerance, improved balance, postural control, ability to compensate for deficits, functional use of  LEFT upper and LEFT lower extremity, and improved coordination.  Patient to discharge at overall Modified Independent/supervision level.  Patient's care partner is independent to provide the necessary physical assistance at discharge.    Reasons goals not met: NA  Recommendation:  Patient will benefit from ongoing skilled OT services in outpatient setting to continue to advance functional skills in the area of iADL and Vocation.  Equipment: RW  Reasons for discharge: treatment goals met and discharge from hospital  Patient/family agrees with progress made and goals achieved: Yes  OT Discharge Precautions/Restrictions  Precautions Precautions: Fall Restrictions Weight Bearing Restrictions Per Provider Order: No Pain Pain Assessment Pain Scale: 0-10 Pain Score: 0-No pain ADL ADL Eating: Independent Where Assessed-Eating: Chair Grooming: Modified independent Where Assessed-Grooming: Standing at sink Upper Body Bathing: Modified independent Where  Assessed-Upper Body Bathing: Shower Lower Body Bathing: Modified independent Where Assessed-Lower Body Bathing: Shower Upper Body Dressing: Modified independent (Device) Where Assessed-Upper Body Dressing: Chair Lower Body Dressing: Modified independent Where Assessed-Lower Body Dressing: Sitting at sink, Standing at sink Toileting: Modified independent Where Assessed-Toileting: Neurosurgeon Method: Ambulating, Stand pivot Tub/Shower Transfer: Close supervison Web designer Method: Engineer, technical sales: Information systems manager with back, Acupuncturist: Modified independent Film/video editor Method: Designer, industrial/product: Sales promotion account executive Baseline Vision/History: 0 No visual deficits Patient Visual Report: No change from baseline Vision Assessment?: No apparent visual deficits Additional Comments: wears glasses except for reading Perception  Perception: Within Functional Limits Praxis Praxis: WFL Cognition Cognition Overall Cognitive Status: Within Functional Limits for tasks assessed Arousal/Alertness: Awake/alert Memory: Appears intact Attention: Selective Sustained Attention: Appears intact Selective Attention: Appears intact Awareness: Appears intact Problem Solving: Appears intact Safety/Judgment: Appears intact Brief Interview for Mental Status (BIMS) Repetition of Three Words (First Attempt): 3 Temporal Orientation: Year: Correct Temporal Orientation: Month: Accurate within 5 days Temporal Orientation: Day: Correct Recall: Sock: Yes, no cue required Recall: Blue: Yes, no cue required Recall: Bed: Yes, no cue required BIMS Summary Score: 15 Sensation Sensation Light Touch: Appears Intact Hot/Cold: Appears Intact Proprioception: Appears Intact Stereognosis: Appears Intact Coordination Gross Motor Movements are Fluid and Coordinated: No Fine Motor  Movements are Fluid and Coordinated: No Coordination and Movement Description: significant improvement in ataxia - very mild in transitions especially new transitions Finger Nose Finger Test: slowness of movement with LUE compared to right.  Deliberate Motor  Motor Motor: Hemiplegia Motor - Discharge Observations: mild left hemiparesis Mobility  Transfers Sit to Stand: Independent with assistive device  Trunk/Postural Assessment  Cervical Assessment Cervical Assessment: Within Functional Limits Thoracic Assessment Thoracic Assessment: Within Functional Limits Lumbar Assessment Lumbar Assessment: Within Functional Limits Postural Control Postural Control: Within Functional Limits  Balance Balance Balance Assessed: Yes Static Sitting Balance Static Sitting - Balance Support: Feet supported;No upper extremity supported Static Sitting - Level of Assistance: 7: Independent Dynamic Sitting Balance Dynamic Sitting - Balance Support: Feet supported;Right upper extremity supported;Left upper extremity supported Dynamic Sitting - Level of Assistance: 6: Modified independent (Device/Increase time) Static Standing Balance Static Standing - Balance Support: Right upper extremity supported;During functional activity Static Standing - Level of Assistance: 6: Modified independent (Device/Increase time) Dynamic Standing Balance Dynamic Standing - Balance Support: Right upper extremity supported;Left upper extremity supported;During functional activity Dynamic Standing - Level of Assistance: 6: Modified independent (Device/Increase time) Extremity/Trunk Assessment RUE Assessment RUE Assessment: Within Functional Limits LUE Assessment LUE Assessment: Exceptions to [facility] Active Range of Motion (AROM) Comments: has ~ 100* of shoulder flwexion General Strength Comments: 4/5 LUE Body System: Neuro Brunstrum levels for arm and hand: Arm;Hand Brunstrum level for arm: Stage V Relative Independence  from Synergy Brunstrum level for hand: Stage VI Isolated joint movements   Aubrey Leaf M 02/07/2024, 10:41 AM

## 2024-02-07 NOTE — Plan of Care (Signed)
  Problem: RH BOWEL ELIMINATION Goal: RH STG MANAGE BOWEL WITH ASSISTANCE Description: STG Manage Bowel with toileting Assistance. Outcome: Progressing Goal: RH STG MANAGE BOWEL W/MEDICATION W/ASSISTANCE Description: STG Manage Bowel with Medication with mod I Assistance. Outcome: Progressing   Problem: RH SAFETY Goal: RH STG ADHERE TO SAFETY PRECAUTIONS W/ASSISTANCE/DEVICE Description: STG Adhere to Safety Precautions With cues Assistance/Device. Outcome: Progressing   Problem: RH PAIN MANAGEMENT Goal: RH STG PAIN MANAGED AT OR BELOW PT'S PAIN GOAL Description: Pain < 4 with prns Outcome: Progressing

## 2024-02-07 NOTE — Progress Notes (Signed)
 PROGRESS NOTE   Subjective/Complaints:  Seen in PT no c/os  ROS: Patient denies abd pain ., N/V  Objective:   No results found.  No results for input(s): WBC, HGB, HCT, PLT in the last 72 hours.  Recent Labs    02/04/24 2052  NA 135  K 4.0  CL 104  CO2 20*  GLUCOSE 122*  BUN 37*  CREATININE 1.49*  CALCIUM  9.7     Intake/Output Summary (Last 24 hours) at 02/07/2024 0838 Last data filed at 02/06/2024 1844 Gross per 24 hour  Intake 237 ml  Output --  Net 237 ml         Physical Exam: Vital Signs Blood pressure 125/86, pulse 79, temperature 98.4 F (36.9 C), temperature source Oral, resp. rate 17, height 5' 4 (1.626 m), weight 96.2 kg, SpO2 100%.    General: No acute distress Mood and affect are appropriate Heart: Regular rate and rhythm no rubs murmurs or extra sounds Lungs: Clear to auscultation, breathing unlabored, no rales or wheezes Abdomen: Positive bowel sounds, soft nontender to palpation, nondistended Extremities: No clubbing, cyanosis, or edema   Skin: No evidence of breakdown, no evidence of rash Neurologic: alert and oriented. Normal insight and awareness. Cranial nerves II through XII intact MMT:  5/5 right sided 4/5 left side Sensory exam normal sensation to light touch and proprioception in bilateral upper and lower extremities Cerebellar exam intention tremor with L FNF  Musculoskeletal: no pain with ROM.Aaron Aas No joint swelling   Assessment/Plan: 1. Functional deficits which require 3+ hours per day of interdisciplinary therapy in a comprehensive inpatient rehab setting. Physiatrist is providing close team supervision and 24 hour management of active medical problems listed below. Physiatrist and rehab team continue to assess barriers to discharge/monitor patient progress toward functional and medical goals  Care Tool:  Bathing    Body parts bathed by patient: Left arm,  Chest, Abdomen, Front perineal area, Right upper leg, Left upper leg, Face, Right arm, Buttocks, Left lower leg, Right lower leg   Body parts bathed by helper: Buttocks, Right arm, Right lower leg, Left lower leg     Bathing assist Assist Level: Independent with assistive device     Upper Body Dressing/Undressing Upper body dressing   What is the patient wearing?: Bra, Pull over shirt    Upper body assist Assist Level: Independent    Lower Body Dressing/Undressing Lower body dressing      What is the patient wearing?: Underwear/pull up, Pants     Lower body assist Assist for lower body dressing: Independent with assitive device     Toileting Toileting    Toileting assist Assist for toileting: Supervision/Verbal cueing     Transfers Chair/bed transfer  Transfers assist  Chair/bed transfer activity did not occur: Safety/medical concerns  Chair/bed transfer assist level: Supervision/Verbal cueing     Locomotion Ambulation   Ambulation assist      Assist level: Contact Guard/Touching assist Assistive device: No Device Max distance: 100   Walk 10 feet activity   Assist     Assist level: Contact Guard/Touching assist Assistive device: No Device   Walk 50 feet activity   Assist Walk 50 feet  with 2 turns activity did not occur: Safety/medical concerns  Assist level: Contact Guard/Touching assist Assistive device: No Device    Walk 150 feet activity   Assist Walk 150 feet activity did not occur: Safety/medical concerns         Walk 10 feet on uneven surface  activity   Assist Walk 10 feet on uneven surfaces activity did not occur: Safety/medical concerns   Assist level: Supervision/Verbal cueing Assistive device: Walker-rolling   Wheelchair     Assist Is the patient using a wheelchair?: Yes Type of Wheelchair: Manual    Wheelchair assist level: Dependent - Patient 0%      Wheelchair 50 feet with 2 turns  activity    Assist        Assist Level: Dependent - Patient 0%   Wheelchair 150 feet activity     Assist      Assist Level: Dependent - Patient 0%   Blood pressure 125/86, pulse 79, temperature 98.4 F (36.9 C), temperature source Oral, resp. rate 17, height 5' 4 (1.626 m), weight 96.2 kg, SpO2 100%.  Medical Problem List and Plan: 1. Functional deficits secondary to left hemiparesis secondary to hypertensive encephalopathy versus conversion disorder             -patient may shower             -ELOS/Goals: 6/17 supervision PT/OT, mod I SLP             - Grounds pass ordered  -IPOC completed  -Continue CIR therapies including PT, OT, and SLP - progressing well, should be ready for d/c in am pending reviewed of lasix renal scan   2.  Antithrombotics: -DVT/anticoagulation:  Pharmaceutical: Lovenox - may d/c amb 103' CGA with PT             -antiplatelet therapy: continue asa  3. Hematuria: UA ordered. Bactrim  started given positive UA, will stop enoxaparin  due to improved ambulation which should help with hematuria UPJ stenosis on CT scan, urology   -6/16 lasix renal scan today  -decision regarding stenting will be made after result reviewed by Urology     4. Constipation: resolved with enema, last BM 6/12  5. Neuropsych/cognition: This patient is capable of making decisions on her own behalf.  6. Abdominal pain: Lidocaine  patch ordered, see #3, continue oxycodone   7. Fluids/Electrolytes/Nutrition: Monitor I/O. Check CMET in am  8. HTN: Monitor BP TID--continue amlodipine  and HCTX Vitals:   02/06/24 1924 02/07/24 0458  BP: 120/83 125/86  Pulse: 69 79  Resp: 18 17  Temp: 98.5 F (36.9 C) 98.4 F (36.9 C)  SpO2: 100% 100%   6/15 bp controlled  9. Sarcoidosis/Dyspnea: On breo with improvement in cough. Follwed by Dr. Alver Jobs.  10. Latent TB: Continue INH and pyridoxine  daily. Educate on importance of compliance.  11. Chronic Lumbar facet syndrome/Lumbar  radiculopathy: Managed with local measures and s/p median branch block 07/2023 per [facility]               --Voltaren  gel added.              - Reviewed chart; similar presentation to her pain management presentations since 2020.  Has failed cyclobenzaprine  (Flexeril ), gabapentin  (Neurontin , Gralise ), Reports mild benefit with use of Diclofenac  but not has not used recently.              - Lumbar MRI 09/21/23:  1. Mild anterolisthesis of L4-L5 with possible bilateral pars defects at this  level.  2. Multilevel lumbar spondylosis, most pronounced at the L4-L5 and L5-S1 levels with marked facet arthropathy. There is mild spinal canal stenosis and subarticular recess narrowing at L4-L5 with disc material likely contacting the bilateral descending L5 nerve roots as described.  3. No substantial foraminal stenosis at any of the lumbar levels.                - Add baclofen  10 mg 3 times daily as needed for muscle spasms   6/15 continue current regimen for pain control  12. Hypokalemia: K+ reviewed and has normalized     Latest Ref Rng & Units 02/04/2024    8:52 PM 02/03/2024    6:01 AM 01/29/2024    4:55 AM  BMP  Glucose 70 - 99 mg/dL 161  096  045   BUN 6 - 20 mg/dL 37  32  22   Creatinine 0.44 - 1.00 mg/dL 4.09  8.11  9.14   Sodium 135 - 145 mmol/L 135  137  134   Potassium 3.5 - 5.1 mmol/L 4.0  4.2  4.2   Chloride 98 - 111 mmol/L 104  103  101   CO2 22 - 32 mmol/L 20  22  26    Calcium  8.9 - 10.3 mg/dL 9.7  9.6  9.0     13. Class 2 obesity: BMI 37.7. Educated on importance of weight loss and mobility to help promote overall health.    14. Microcytic anemia: iron studies reviewed and are stable   LOS: 10 days A FACE TO FACE EVALUATION WAS PERFORMED  Genetta Kenning 02/07/2024, 8:38 AM

## 2024-02-07 NOTE — Progress Notes (Signed)
 Occupational Therapy Note  Patient Details  Name: KENDAL GHAZARIAN MRN: [id number] Date of Birth: [birth year]  Today's Date: 02/07/2024 OT Missed Time: 60 Minutes Missed Time Reason: Unavailable (comment);Other (comment) (procedure)  Pt missed 60 mins of group session d/t procedure.    Mollie Anger [facility] 02/07/2024, 12:28 PM

## 2024-02-07 NOTE — Progress Notes (Signed)
 Physical Therapy Discharge Summary  Patient Details  Name: Jackie Reed MRN: [id number] Date of Birth: 1966-04-18  Date of Discharge from PT service:February 07, 2024  Today's Date: 02/07/2024 PT Individual Time: 0800-0912 PT Individual Time Calculation (min): 72 min    Patient has met 11 of 11 long term goals due to improved activity tolerance, improved balance, and improved coordination.  Patient to discharge at an ambulatory level Modified Independent.   Patient's care partner is independent to provide the necessary physical assistance at discharge.  Reasons goals not met: na  Recommendation:  Patient will benefit from ongoing skilled PT services in outpatient setting to continue to advance safe functional mobility, address ongoing impairments in navigating obstacles, single leg balance, lower body strength, and minimize fall risk.  Equipment: No equipment provided RW  Reasons for discharge: discharge from hospital  Patient/family agrees with progress made and goals achieved: Yes  PT Discharge Precautions/Restrictions Precautions Precautions: Fall Recall of Precautions/Restrictions: Intact Restrictions Weight Bearing Restrictions Per Provider Order: No    Pain Interference Pain Interference Pain Effect on Sleep: 4. Almost constantly Pain Interference with Therapy Activities: 2. Occasionally Pain Interference with Day-to-Day Activities: 1. Rarely or not at all Vision/Perception  Vision - History Ability to See in Adequate Light: 0 Adequate Praxis Praxis: WFL  Cognition Overall Cognitive Status: Within Functional Limits for tasks assessed Arousal/Alertness: Awake/alert Attention: Selective Sustained Attention: Appears intact Selective Attention: Appears intact Memory: Appears intact Awareness: Appears intact Problem Solving: Appears intact Safety/Judgment: Appears intact Sensation Sensation Light Touch: Appears Intact Hot/Cold: Appears Intact Proprioception:  Appears Intact Stereognosis: Appears Intact Coordination Gross Motor Movements are Fluid and Coordinated: No Fine Motor Movements are Fluid and Coordinated: No Coordination and Movement Description: significant improvement in ataxia - very mild in transitions especially new transitions Finger Nose Finger Test: slowness of movement with LUE compared to right.  Deliberate Motor  Motor Motor: Hemiplegia Motor - Discharge Observations: mild left hemiparesis  Mobility Transfers Transfers: Sit to Stand;Transfer Sit to Stand: Independent with assistive device Transfer (Assistive device): Rolling walker Locomotion  Gait Ambulation: Yes Gait Assistance: Independent with assistive device Gait Distance (Feet): 200 Feet Assistive device: Rolling walker Gait Assistance Details: minimal cues needed for walking with roller walker Gait Gait: Yes Gait Pattern: Within Functional Limits Gait Pattern: Within Functional Limits;Decreased step length - left Gait velocity: 0.41 High Level Ambulation High Level Ambulation: Direction changes (making turns without walker) Stairs / Additional Locomotion Stairs: Yes Stairs Assistance: Supervision/Verbal cueing Stair Management Technique: One rail Right Number of Stairs: 12 Height of Stairs: 6 Ramp: Supervision/Verbal cueing Curb: Independent with assistive device Pick up small object from the floor assist level: Supervision/Verbal cueing Wheelchair Mobility Wheelchair Mobility: No  Trunk/Postural Assessment  Cervical Assessment Cervical Assessment: Within Functional Limits Thoracic Assessment Thoracic Assessment: Within Functional Limits Lumbar Assessment Lumbar Assessment: Within Functional Limits Postural Control Postural Control: Within Functional Limits  Balance Balance Balance Assessed: Yes Standardized Balance Assessment Standardized Balance Assessment: Timed Up and Go Test Timed Up and Go Test TUG: Normal TUG Normal TUG (seconds):  33.19 Static Sitting Balance Static Sitting - Balance Support: Feet supported;No upper extremity supported Static Sitting - Level of Assistance: 7: Independent Dynamic Sitting Balance Dynamic Sitting - Balance Support: Feet supported;Right upper extremity supported;Left upper extremity supported Dynamic Sitting - Level of Assistance: 6: Modified independent (Device/Increase time) Static Standing Balance Static Standing - Balance Support: Right upper extremity supported;During functional activity Static Standing - Level of Assistance: 6: Modified independent (Device/Increase time) Dynamic Standing  Balance Dynamic Standing - Balance Support: Right upper extremity supported;Left upper extremity supported;During functional activity Dynamic Standing - Level of Assistance: 6: Modified independent (Device/Increase time) Extremity Assessment      RLE Assessment RLE Assessment: Within Functional Limits LLE Assessment LLE Assessment: Within Functional Limits Patient supine in bed on entrance to room. Patient alert and agreeable to PT session, pt. Helped with upper body dressing to place gown on her back in reverse. Pt was not in any pain and felt like she was progressing well and looked forward to getting home. Pt. Performed TUG and gait speed outcome measures and performed well today. Pt. Navigated steps with step over step technique independence. Pt was cued on her  HEP and performed each exercise proficiently and understands her exercise progressions. Pt performed well today and was left in her chair without chair alarm without any concerns of getting up with call bell and needs in reach.   Therapeutic Activity: Bed Mobility: Pt performed seated EOB to supine, and supine to EOB independent without any verbal cues.  Transfers: Pt performed sit<>stand and with and without RW for transfers throughout session with minimimal cues neccessary to perform safely. Provided VC for cues for leaning  foward. -Hand supported marching 2x10 -Hand supported pause squats 1x10 -Hand supported SL abduction raise 1x10 -Hand supported BL calf raise 1x10  Gait Training:  Pt ambulated ~12ft using RW with supervision. No cues neccessary   Neuromuscular Re-ed: NMR facilitated during session with focus on gait speed and obstable navigation.  TUG- Trial 1 w/ RW-33.19 seconds   Trial 2 - No AD - 34.12 with CGA -Without AD pt hesitated while making sharp turn   NMR performed for improvements in motor control and coordination, balance, sequencing, judgement, and self confidence/ efficacy in performing all aspects of mobility at highest level of independence.   HEP Exercises  - Standing March with Counter Support  - 2 x daily - 7 x weekly - 3 sets - 20 reps  - Heel Raises with Counter Support  - 2 x daily - 7 x weekly - 3 sets - 20 reps  - Standing Hip Abduction with Counter Support  - 2 x daily - 7 x weekly - 3 sets - 10 reps  - Mini Squat with Counter Support  - 1 x daily - 7 x weekly - 3 sets - 15 reps - 1sec hold    Patient seated at end of session in recliner with all needs within reach.  Melodie Ashworth 02/07/2024, 3:28 PM

## 2024-02-07 NOTE — Progress Notes (Addendum)
     Subjective: Patient was resting comfortably in room on rounds.  No acute events overnight.  No complaint of pain.  Objective: Vital signs in last 24 hours: Temp:  [97.8 F (36.6 C)-98.6 F (37 C)] 98.6 F (37 C) (06/16 0930) Pulse Rate:  [69-82] 82 (06/16 0930) Resp:  [16-18] 16 (06/16 0930) BP: (104-129)/(58-97) 129/97 (06/16 0940) SpO2:  [97 %-100 %] 97 % (06/16 0930)  Assessment/Plan: 58 y.o. female with admitted to inpatient rehabilitation for functional deficits following presumed CVA .  Patient complaining of pain on the right side and CT A/P identified partial right UPJ obstruction.   # Partial right UPJ obstruction # Severe constipation No bowel movement in 2 weeks on initial assessment.  Continue aggressive bowel regimen.   Postponed Lasix renogram due to unavailability of radioisotope.  Completed this morning with 52%L/48%R equal percentages of excretion.  Will wait for the official read but there does not appear to be an actionable obstruction.  Would recommend to follow-up closely with Dr. MacDiarmid.   # AKI Uptick of serum creatinine noted on 6/13 (1.49).  This has not been trended.  BMP ordered today.  Addendum: SCr has normalized. Would continue working on aggressive bowel regimen recommend outpt follow up from this point. Urology will sign off at this time. Please call with questions.    Intake/Output from previous day: 06/15 0701 - 06/16 0700 In: 457 [P.O.:457] Out: -   Intake/Output this shift: No intake/output data recorded.  Physical Exam:  General: Alert and oriented CV: No cyanosis Lungs: equal chest rise Abdomen: Soft, NTND, no rebound or guarding  Lab Results: No results for input(s): HGB, HCT in the last 72 hours. BMET Recent Labs    02/04/24 2052  NA 135  K 4.0  CL 104  CO2 20*  GLUCOSE 122*  BUN 37*  CREATININE 1.49*  CALCIUM  9.7     Studies/Results: No results found.    LOS: 10 days   Alla Ar,  NP [facility] Pager: [phone]  02/07/2024, 12:44 PM

## 2024-02-08 ENCOUNTER — Other Ambulatory Visit (HOSPITAL_COMMUNITY): Payer: Self-pay

## 2024-02-08 DIAGNOSIS — K5901 Slow transit constipation: Secondary | ICD-10-CM | POA: Diagnosis not present

## 2024-02-08 DIAGNOSIS — N179 Acute kidney failure, unspecified: Secondary | ICD-10-CM

## 2024-02-08 DIAGNOSIS — R310 Gross hematuria: Secondary | ICD-10-CM | POA: Diagnosis not present

## 2024-02-08 DIAGNOSIS — G9340 Encephalopathy, unspecified: Secondary | ICD-10-CM | POA: Diagnosis not present

## 2024-02-08 MED ORDER — BUDESONIDE-FORMOTEROL FUMARATE 80-4.5 MCG/ACT IN AERO
1.0000 | INHALATION_SPRAY | Freq: Every day | RESPIRATORY_TRACT | 0 refills | Status: DC
  Filled 2024-02-08: qty 10.2, 30d supply, fill #0

## 2024-02-08 MED ORDER — ROSUVASTATIN CALCIUM 20 MG PO TABS: 20.0000 mg | ORAL_TABLET | Freq: Every day | ORAL | 0 refills | Status: DC

## 2024-02-08 MED ORDER — PYRIDOXINE HCL 50 MG PO TABS: 50.0000 mg | ORAL_TABLET | Freq: Every day | ORAL | 0 refills | Status: DC

## 2024-02-08 MED ORDER — AMLODIPINE BESYLATE 10 MG PO TABS
10.0000 mg | ORAL_TABLET | Freq: Every day | ORAL | 0 refills | Status: DC
  Filled 2024-02-08: qty 30, 30d supply, fill #0

## 2024-02-08 MED ORDER — ALUM & MAG HYDROXIDE-SIMETH 200-200-20 MG/5ML PO SUSP
30.0000 mL | ORAL | 0 refills | Status: DC | PRN
  Filled 2024-02-08: qty 355, 2d supply, fill #0

## 2024-02-08 NOTE — Progress Notes (Signed)
 PROGRESS NOTE   Subjective/Complaints:  Pt without new issues. Only mild discomfort in back. Urine clear. Reviewed note from urology. Pt says she did not discuss the results of test with anyone  ROS: Patient denies fever, rash, sore throat, blurred vision, dizziness, nausea, vomiting, diarrhea, cough, shortness of breath or chest pain, joint or back/neck pain, headache, or mood change.   Objective:   NM Renal Imaging Flow W/Pharm Result Date: 02/07/2024 CLINICAL DATA:  Urinary fistula.  RIGHT hydronephrosis EXAM: NUCLEAR MEDICINE RENAL SCAN WITH DIURETIC ADMINISTRATION TECHNIQUE: Radionuclide angiographic and sequential renal images were obtained after intravenous injection of radiopharmaceutical. Imaging was continued during slow intravenous injection of Lasix approximately 15 minutes after the start of the examination. RADIOPHARMACEUTICALS:  5.5 mCi Technetium-69m MAG3 IV COMPARISON:  CT 02/03/2024 FINDINGS: Flow:  Prompt symmetric arterial flow to the kidneys. Left renogram: Uniform uptake of counts in the renal cortex. Counts are promptly excreted into the collecting system and cleared prior to administration of Lasix. Lasix augment clearance. No postvoid residual. Right renogram: Uniform uptake of counts in the renal cortex. Counts are promptly excreted into the collecting system and cleared prior to administration of Lasix. Lasix augment clearance. No postvoid residual. On the summed images the RIGHT renal pelvis is slightly more prominent than LEFT. Differential: Left kidney = 52 % Right kidney = 48 % T1/2 post Lasix : Left kidney = (counts clear near completely prior to administration of Lasix) Right kidney = (counts clear near completely prior to administration of Lasix) IMPRESSION: 1. Normal function of the LEFT and RIGHT kidney. No obstructive hydronephrosis. 2. Slight prominence of the RIGHT renal pelvis. Electronically Signed   By:  Deboraha Fallow M.D.   On: 02/07/2024 15:45    No results for input(s): WBC, HGB, HCT, PLT in the last 72 hours.  Recent Labs    02/07/24 1259  NA 138  K 3.8  CL 103  CO2 25  GLUCOSE 92  BUN 24*  CREATININE 0.93  CALCIUM  10.1    No intake or output data in the 24 hours ending 02/08/24 5621        Physical Exam: Vital Signs Blood pressure 116/74, pulse 80, temperature 98.4 F (36.9 C), temperature source Oral, resp. rate 18, height 5' 4 (1.626 m), weight 96.2 kg, SpO2 97%.   Constitutional: No distress . Vital signs reviewed. HEENT: NCAT, EOMI, oral membranes moist Neck: supple Cardiovascular: RRR without murmur. No JVD    Respiratory/Chest: CTA Bilaterally without wheezes or rales. Normal effort    GI/Abdomen: BS +, non-tender, non-distended Ext: no clubbing, cyanosis, or edema Psych: pleasant and cooperative  Skin: No evidence of breakdown, no evidence of rash Neurologic: alert and oriented. Normal insight and awareness. Cranial nerves II through XII intact MMT:  5/5 right sided 4/5 left side Sensory exam normal sensation to light touch and proprioception in bilateral upper and lower extremities Cerebellar exam intention tremor with L FNF  Musculoskeletal: no pain with ROM.Aaron Aas No joint swelling  Urine: clear   Assessment/Plan: 1. Functional deficits which require 3+ hours per day of interdisciplinary therapy in a comprehensive inpatient rehab setting. Physiatrist is providing close team supervision and 24 hour management  of active medical problems listed below. Physiatrist and rehab team continue to assess barriers to discharge/monitor patient progress toward functional and medical goals  Care Tool:  Bathing    Body parts bathed by patient: Left arm, Chest, Abdomen, Front perineal area, Right upper leg, Left upper leg, Face, Right arm, Buttocks, Left lower leg, Right lower leg   Body parts bathed by helper: Buttocks, Right arm, Right lower leg, Left  lower leg     Bathing assist Assist Level: Independent with assistive device     Upper Body Dressing/Undressing Upper body dressing   What is the patient wearing?: Bra, Pull over shirt    Upper body assist Assist Level: Independent with assistive device    Lower Body Dressing/Undressing Lower body dressing      What is the patient wearing?: Underwear/pull up, Pants     Lower body assist Assist for lower body dressing: Independent with assitive device     Toileting Toileting    Toileting assist Assist for toileting: Independent with assistive device     Transfers Chair/bed transfer  Transfers assist  Chair/bed transfer activity did not occur: Safety/medical concerns  Chair/bed transfer assist level: Independent with assistive device Chair/bed transfer assistive device: Armrests, Geologist, engineering   Ambulation assist      Assist level: Independent with assistive device Assistive device: Walker-rolling Max distance: 150   Walk 10 feet activity   Assist     Assist level: Independent with assistive device Assistive device: Walker-rolling   Walk 50 feet activity   Assist Walk 50 feet with 2 turns activity did not occur: Safety/medical concerns  Assist level: Independent with assistive device Assistive device: Walker-rolling    Walk 150 feet activity   Assist Walk 150 feet activity did not occur: Safety/medical concerns  Assist level: Independent with assistive device Assistive device: Walker-rolling    Walk 10 feet on uneven surface  activity   Assist Walk 10 feet on uneven surfaces activity did not occur: N/A   Assist level: Supervision/Verbal cueing Assistive device: Walker-rolling   Wheelchair     Assist Is the patient using a wheelchair?: No Type of Wheelchair: Manual    Wheelchair assist level: Dependent - Patient 0%      Wheelchair 50 feet with 2 turns activity    Assist        Assist Level:  Dependent - Patient 0%   Wheelchair 150 feet activity     Assist      Assist Level: Dependent - Patient 0%   Blood pressure 116/74, pulse 80, temperature 98.4 F (36.9 C), temperature source Oral, resp. rate 18, height 5' 4 (1.626 m), weight 96.2 kg, SpO2 97%.  Medical Problem List and Plan: 1. Functional deficits secondary to left hemiparesis secondary to hypertensive encephalopathy versus conversion disorder             -patient may shower             -dc home today. Follow up with primary, [facility], urology, [facility] in 6 weeks  2.  Antithrombotics: -DVT/anticoagulation:  Pharmaceutical: Lovenox - may d/c amb 6' CGA with PT             -antiplatelet therapy: continue asa  3. Hematuria: UA ordered. Bactrim  started given positive UA, will stop enoxaparin  due to improved ambulation which should help with hematuria UPJ stenosis on CT scan    -6/17 lasix renal scan without actionable obstruction. Pt to follow up with Dr. Clarke Crouch as outpt   -  urine clear, pain much improved. BUN/Cr now normal   -encouraged ample fluids, bowel mgt as below  4. Constipation: resolved with enema   -6/17 had bm yesterday, moving bowels regularly   -maintain bowel reg at home 5. Neuropsych/cognition: This patient is capable of making decisions on her own behalf.  6. Abdominal pain: Lidocaine  patch ordered, see #3, continue oxycodone   7. Fluids/Electrolytes/Nutrition: Monitor I/O. Check CMET in am  8. HTN: Monitor BP TID--continue amlodipine  and HCTX Vitals:   02/07/24 1943 02/08/24 0610  BP: 119/68 116/74  Pulse: 96 80  Resp: 18 18  Temp: 98.1 F (36.7 C) 98.4 F (36.9 C)  SpO2: 98% 97%   6/17 bp controlled  9. Sarcoidosis/Dyspnea: On breo with improvement in cough. Follwed by Dr. Alver Jobs.  10. Latent TB: Continue INH and pyridoxine  daily. Educate on importance of compliance.  11. Chronic Lumbar facet syndrome/Lumbar radiculopathy: Managed with local measures and s/p median branch block  07/2023 per [facility]               --Voltaren  gel added.              - Reviewed chart; similar presentation to her pain management presentations since 2020.  Has failed cyclobenzaprine  (Flexeril ), gabapentin  (Neurontin , Gralise ), Reports mild benefit with use of Diclofenac  but not has not used recently.              - Lumbar MRI 09/21/23:  1. Mild anterolisthesis of L4-L5 with possible bilateral pars defects at this level.  2. Multilevel lumbar spondylosis, most pronounced at the L4-L5 and L5-S1 levels with marked facet arthropathy. There is mild spinal canal stenosis and subarticular recess narrowing at L4-L5 with disc material likely contacting the bilateral descending L5 nerve roots as described.  3. No substantial foraminal stenosis at any of the lumbar levels.                - Add baclofen  10 mg 3 times daily as needed for muscle spasms   6/17 continue current regimen for pain control  12. Hypokalemia: K+ reviewed and has normalized     Latest Ref Rng & Units 02/07/2024   12:59 PM 02/04/2024    8:52 PM 02/03/2024    6:01 AM  BMP  Glucose 70 - 99 mg/dL 92  782  956   BUN 6 - 20 mg/dL 24  37  32   Creatinine 0.44 - 1.00 mg/dL 2.13  0.86  5.78   Sodium 135 - 145 mmol/L 138  135  137   Potassium 3.5 - 5.1 mmol/L 3.8  4.0  4.2   Chloride 98 - 111 mmol/L 103  104  103   CO2 22 - 32 mmol/L 25  20  22    Calcium  8.9 - 10.3 mg/dL 46.9  9.7  9.6     13. Class 2 obesity: BMI 37.7. Educated on importance of weight loss and mobility to help promote overall health.    14. Microcytic anemia: iron studies reviewed and are stable   LOS: 11 days A FACE TO FACE EVALUATION WAS PERFORMED  Rawland Caddy 02/08/2024, 9:22 AM

## 2024-02-08 NOTE — Progress Notes (Signed)
 Inpatient Rehabilitation Care Coordinator Discharge Note   Patient Details  Name: Jackie Reed MRN: [id number] Date of Birth: 1966/03/18   Discharge location: HOME WITH HUSBAND AND SISTER IN-LAW TO BE THERE WHILE HUSBAND WORKS  Length of Stay: 11 DAYS  Discharge activity level: SUPERVISION  Home/community participation: ACTIVE  Patient response NW:GNFAOZ Literacy - How often do you need to have someone help you when you read instructions, pamphlets, or other written material from your doctor or pharmacy?: Never  Patient response HY:QMVHQI Isolation - How often do you feel lonely or isolated from those around you?: Never  Services provided included: MD, RD, PT, OT, SLP, RN, CM, Pharmacy, Neuropsych, SW  Financial Services:  Field seismologist Utilized: Private Insurance [employer] [facility]  Choices offered to/list presented to: PT  Follow-up services arranged:  Outpatient, DME, Patient/Family has no preference for HH/DME agencies    Outpatient Servicies: [facility] [address] PT & OT WILL CALL TO SET UP FOLLOW UP APPOINTMENTS DME : [facility]    Patient response to transportation need: Is the patient able to respond to transportation needs?: Yes In the past 12 months, has lack of transportation kept you from medical appointments or from getting medications?: No In the past 12 months, has lack of transportation kept you from meetings, work, or from getting things needed for daily living?: No   Patient/Family verbalized understanding of follow-up arrangements:  Yes  Individual responsible for coordination of the follow-up plan: Jackie Reed [phone]  Confirmed correct DME delivered: Jackie Reed 02/08/2024    Comments (or additional information):PT DID WELL AND REACHED HER GOALS OF SUPERVISION. WILL HAVE SISTER IN-LAW WITH HER WHILE HUSBAND WORKS.   Summary of Stay    Date/Time Discharge Planning CSW  02/02/24 0910 HOme with husband who works  but sister in-law will be there with here and provide supervision level. Barriers split level home multiple steps. Awaitng teams recommendations RGD       Jackie Reed

## 2024-02-10 ENCOUNTER — Other Ambulatory Visit: Payer: Self-pay

## 2024-02-10 ENCOUNTER — Ambulatory Visit (INDEPENDENT_AMBULATORY_CARE_PROVIDER_SITE_OTHER): Admitting: Internal Medicine

## 2024-02-10 ENCOUNTER — Encounter: Payer: Self-pay | Admitting: Internal Medicine

## 2024-02-10 VITALS — BP 129/79 | HR 90 | Temp 97.8°F | Ht 64.0 in | Wt 210.0 lb

## 2024-02-10 DIAGNOSIS — Z227 Latent tuberculosis: Secondary | ICD-10-CM

## 2024-02-10 NOTE — Patient Instructions (Signed)
 Isohiazed and b6 till 7/10

## 2024-02-10 NOTE — Progress Notes (Unsigned)
 Patient: NIURKA BENECKE  DOB: [birth year] MRN: [id number] PCP: Aleta Anda, MD  Referring Provider: ***  Chief Complaint  Patient presents with   Follow-up     Patient Active Problem List   Diagnosis Date Noted   Cognitive and neurobehavioral dysfunction 02/04/2024   Encephalopathy 01/28/2024   Encephalopathy, hypertensive 01/20/2024   Medication monitoring encounter 10/01/2023   TB lung, latent 08/27/2023   Sarcoidosis 04/22/2023   Adenopathy 02/09/2023   History of COVID-19 10/03/2020   Hepatitis B carrier (HCC) 07/03/2020   Mass of left hand 10/02/2019   Elevated blood pressure reading in office with diagnosis of hypertension 09/11/2019   Pre-diabetes 06/14/2019   Low back pain without sciatica 03/07/2019   Back muscle spasm 03/07/2019   History of recent fall 12/31/2018   Sprain of right ankle 11/18/2018   Class 2 severe obesity due to excess calories with serious comorbidity and body mass index (BMI) of 35.0 to 35.9 in adult (HCC) 10/31/2018   Essential hypertension, benign 11/08/2015   Tendon calcification 11/08/2015   Adjustment disorder with mixed anxiety and depressed mood 11/08/2015   Insomnia 11/08/2015     Subjective:  AUDELIA KNAPE is a 58 y.o. @GENDER @ with   ROS  Past Medical History:  Diagnosis Date   Dyspnea    occasional   E-coli UTI 08/2019   Hepatitis 05/2020   Hepatitis B core antibody positive   Hypertension    Migraine    Plantar fibromatosis    PONV (postoperative nausea and vomiting)    Pre-diabetes    Sarcoidosis    TB lung, latent    Thyroid  nodule 01/29/2023   seen on PET scan   Vitamin B12 deficiency 06/2019   Vitamin D  deficiency 06/2019    Outpatient Medications Prior to Visit  Medication Sig Dispense Refill   acetaminophen  (TYLENOL ) 325 MG tablet Take 1-2 tablets (325-650 mg total) by mouth every 4 (four) hours as needed for mild pain (pain score 1-3). 30 tablet 0   alum & mag hydroxide-simeth (MAALOX/MYLANTA)  200-200-20 MG/5ML suspension Take 30 mLs by mouth every 4 (four) hours as needed for indigestion. 355 mL 0   amLODipine  (NORVASC ) 10 MG tablet Take 1 tablet (10 mg total) by mouth daily. 30 tablet 0   aspirin  EC 81 MG tablet Take 1 tablet (81 mg total) by mouth daily. Swallow whole. 30 tablet 0   baclofen  (LIORESAL ) 10 MG tablet Take 1 tablet (10 mg total) by mouth 3 (three) times daily as needed for muscle spasms. 30 each 0   bisacodyl  (DULCOLAX) 10 MG suppository Place 1 suppository (10 mg total) rectally daily as needed for moderate constipation. 12 suppository 0   budesonide -formoterol  (SYMBICORT ) 80-4.5 MCG/ACT inhaler Inhale 1 puff into the lungs daily. 10.2 g 0   diclofenac  Sodium (VOLTAREN ) 1 % GEL Apply 2 g topically 4 (four) times daily. 100 g 0   hydrochlorothiazide  (HYDRODIURIL ) 25 MG tablet Take 1 tablet (25 mg total) by mouth daily. 30 tablet 0   isoniazid  (NYDRAZID ) 300 MG tablet Take 1 tablet (300 mg total) by mouth in the morning. 30 tablet 0   oxyCODONE  (OXY IR/ROXICODONE ) 5 MG immediate release tablet Take 1 tablet (5 mg total) by mouth every 6 (six) hours as needed for severe pain (pain score 7-10). 15 tablet 0   polyethylene glycol powder (GLYCOLAX/MIRALAX) 17 GM/SCOOP powder Take 17 g by mouth 2 (two) times daily. 238 g 0   potassium chloride  SA (KLOR-CON  M)  20 MEQ tablet Take 1 tablet (20 mEq total) by mouth daily. 30 tablet 0   pyridOXINE  (B-6) 50 MG tablet Take 1 tablet (50 mg total) by mouth daily. 30 tablet 0   rosuvastatin  (CRESTOR ) 20 MG tablet Take 1 tablet (20 mg total) by mouth daily. 30 tablet 0   senna-docusate (SENOKOT-S) 8.6-50 MG tablet Take 2 tablets by mouth 2 (two) times daily. 120 tablet 0   No facility-administered medications prior to visit.     Allergies  Allergen Reactions   Amoxil  [Amoxicillin ] Shortness Of Breath and Palpitations   Morphine  And Codeine Hives and Rash   Ultram  [Tramadol ] Nausea And Vomiting    Social History   Tobacco Use    Smoking status: Never   Smokeless tobacco: Never  Vaping Use   Vaping status: Never Used  Substance Use Topics   Alcohol use: No    Alcohol/week: 0.0 standard drinks of alcohol   Drug use: No    Family History  Problem Relation Age of Onset   Heart failure Mother    Diabetes Mother    Heart failure Father    Hypertension Father    Hyperlipidemia Father    Colon cancer Neg Hx    Colon polyps Neg Hx    Breast cancer Neg Hx    Crohn's disease Neg Hx    Esophageal cancer Neg Hx    Rectal cancer Neg Hx    Stomach cancer Neg Hx    Ulcerative colitis Neg Hx     Objective:   Vitals:   02/10/24 0841  BP: 129/79  Pulse: 90  Temp: 97.8 F (36.6 C)  TempSrc: Oral  SpO2: 98%  Weight: 210 lb (95.3 kg)  Height: 5' 4 (1.626 m)   Body mass index is 36.05 kg/m.  Physical Exam  Lab Results: Lab Results  Component Value Date   WBC 6.2 01/31/2024   HGB 11.9 (L) 01/31/2024   HCT 38.4 01/31/2024   MCV 71.9 (L) 01/31/2024   PLT 412 (H) 01/31/2024    Lab Results  Component Value Date   CREATININE 0.93 02/07/2024   BUN 24 (H) 02/07/2024   NA 138 02/07/2024   K 3.8 02/07/2024   CL 103 02/07/2024   CO2 25 02/07/2024    Lab Results  Component Value Date   ALT 21 01/31/2024   AST 37 01/31/2024   ALKPHOS 147 (H) 01/31/2024   BILITOT 0.7 01/31/2024     Assessment & Plan:   Problem List Items Addressed This Visit   None  /EOT 7/8 LFT on 6/9 stable, ALP slighly elevated->chonic -Metrhotrexate held till INH completed F.U on mth  Orlie Bjornstad, MD [facility]   02/10/24  9:13 AM

## 2024-02-24 ENCOUNTER — Telehealth: Payer: Self-pay

## 2024-02-24 NOTE — Telephone Encounter (Signed)
 Received FMLA paperwork from [employer]. Reached out to Wilson at [facility] regarding completion as we are not seeing the patient in the office until September, she stated they could work on it. Per patient, form is due 7/9. I spoke with Bari who advised the person who completes them is out of the office until 7/8. Reached out to [employer] to see if deadline could be extended. They advised that they can not extend the dates, but the form could be submitted after the date.  I sent a message to Duwaine HERO. NP at our office to see how to proceed with this, if we can fill out the form or if the hospital needs to. Waiting to hear back. Called and spoke with patient and gave her an update on what was going on, advised her I would call back once everything was figured out

## 2024-03-02 ENCOUNTER — Encounter: Payer: Self-pay | Admitting: Physical Therapy

## 2024-03-02 ENCOUNTER — Ambulatory Visit: Admitting: Physical Therapy

## 2024-03-02 ENCOUNTER — Ambulatory Visit: Attending: Physician Assistant | Admitting: Occupational Therapy

## 2024-03-02 ENCOUNTER — Other Ambulatory Visit: Payer: Self-pay

## 2024-03-02 ENCOUNTER — Encounter: Payer: Self-pay | Admitting: Occupational Therapy

## 2024-03-02 VITALS — BP 103/77 | HR 80

## 2024-03-02 DIAGNOSIS — M6281 Muscle weakness (generalized): Secondary | ICD-10-CM | POA: Insufficient documentation

## 2024-03-02 DIAGNOSIS — R29898 Other symptoms and signs involving the musculoskeletal system: Secondary | ICD-10-CM | POA: Diagnosis present

## 2024-03-02 DIAGNOSIS — R29818 Other symptoms and signs involving the nervous system: Secondary | ICD-10-CM | POA: Diagnosis present

## 2024-03-02 DIAGNOSIS — R2689 Other abnormalities of gait and mobility: Secondary | ICD-10-CM

## 2024-03-02 DIAGNOSIS — R41844 Frontal lobe and executive function deficit: Secondary | ICD-10-CM | POA: Diagnosis present

## 2024-03-02 DIAGNOSIS — G9340 Encephalopathy, unspecified: Secondary | ICD-10-CM | POA: Insufficient documentation

## 2024-03-02 DIAGNOSIS — R278 Other lack of coordination: Secondary | ICD-10-CM | POA: Insufficient documentation

## 2024-03-02 DIAGNOSIS — R2681 Unsteadiness on feet: Secondary | ICD-10-CM

## 2024-03-02 NOTE — Therapy (Signed)
 OUTPATIENT OCCUPATIONAL THERAPY NEURO EVALUATION  Patient Name: Jackie Reed MRN: [id number] DOB:26-Jun-1966, 58 y.o., female Today's Date: 03/02/2024  PCP: Waylan Almarie SAUNDERS, MD  REFERRING PROVIDER: Pegge Toribio PARAS, PA-C  END OF SESSION:  OT End of Session - 03/02/24 1455     Visit Number 1    Number of Visits 13    Date for OT Re-Evaluation 04/14/24    Authorization Type [employer]    OT Start Time 1453    OT Stop Time 1531    OT Time Calculation (min) 38 min    Activity Tolerance Patient tolerated treatment well    Behavior During Therapy [facility] for tasks assessed/performed         Past Medical History:  Diagnosis Date   Dyspnea    occasional   E-coli UTI 08/2019   Hepatitis 05/2020   Hepatitis B core antibody positive   Hypertension    Migraine    Plantar fibromatosis    PONV (postoperative nausea and vomiting)    Pre-diabetes    Sarcoidosis    TB lung, latent    Thyroid  nodule 01/29/2023   seen on PET scan   Vitamin B12 deficiency 06/2019   Vitamin D  deficiency 06/2019   Past Surgical History:  Procedure Laterality Date   ABDOMINAL HYSTERECTOMY     FINE NEEDLE ASPIRATION  02/19/2023   Procedure: FINE NEEDLE ASPIRATION (FNA) LINEAR;  Surgeon: Brenna Adine CROME, DO;  Location: [facility];  Service: Cardiopulmonary;;   FOOT SURGERY Bilateral 1985   MASS EXCISION Right 05/19/2018   Procedure: EXCISION PALMAR NODULES RIGHT HAND;  Surgeon: Murrell Drivers, MD;  Location: [facility];  Service: Orthopedics;  Laterality: Right;   PARTIAL HYSTERECTOMY  1997   VIDEO BRONCHOSCOPY WITH ENDOBRONCHIAL ULTRASOUND Bilateral 02/19/2023   Procedure: VIDEO BRONCHOSCOPY WITH ENDOBRONCHIAL ULTRASOUND;  Surgeon: Brenna Adine CROME, DO;  Location: [facility];  Service: Cardiopulmonary;  Laterality: Bilateral;   Patient Active Problem List   Diagnosis Date Noted   Cognitive and neurobehavioral dysfunction 02/04/2024   Encephalopathy 01/28/2024   Encephalopathy,  hypertensive 01/20/2024   Medication monitoring encounter 10/01/2023   TB lung, latent 08/27/2023   Sarcoidosis 04/22/2023   Adenopathy 02/09/2023   History of COVID-19 10/03/2020   Hepatitis B carrier (HCC) 07/03/2020   Mass of left hand 10/02/2019   Elevated blood pressure reading in office with diagnosis of hypertension 09/11/2019   Pre-diabetes 06/14/2019   Low back pain without sciatica 03/07/2019   Back muscle spasm 03/07/2019   History of recent fall 12/31/2018   Sprain of right ankle 11/18/2018   Class 2 severe obesity due to excess calories with serious comorbidity and body mass index (BMI) of 35.0 to 35.9 in adult [facility]) 10/31/2018   Essential hypertension, benign 11/08/2015   Tendon calcification 11/08/2015   Adjustment disorder with mixed anxiety and depressed mood 11/08/2015   Insomnia 11/08/2015    ONSET DATE: 02/04/2024 (Date of referral)  REFERRING DIAG: G93.40 (ICD-10-CM) - Encephalopathy, unspecified   THERAPY DIAG:  No diagnosis found.  Rationale for Evaluation and Treatment: Rehabilitation  SUBJECTIVE:   SUBJECTIVE STATEMENT: She takes Baclofen  for abdominal/pelvic region. Works for [employer] and utilizes [employer]. She works from home.  Pt accompanied by: self  PERTINENT HISTORY: PMH: HTN, Sarcoidosis, hepatitis, migraines, LBP, Latent TB (positive QuantiFERON test)   PRECAUTIONS: Fall  WEIGHT BEARING RESTRICTIONS: No  PAIN:  Are you having pain? No  FALLS: Has patient fallen in last 6 months? No  LIVING ENVIRONMENT: Lives with:  lives with their spouse Lives in: House/apartment Stairs: Yes: Internal: splint level steps; on right going up and no rail to go downstairs and External: 2 steps; none Has following equipment at home: Vannie - 2 wheeled  PLOF: Independent; driving; phone scheduler with computer work; reading; puzzles (crosswords)  PATIENT GOALS: return to work/PLOF  OBJECTIVE:  Note: Objective measures were completed at  Evaluation unless otherwise noted.  HAND DOMINANCE: Left  ADLs: Overall ADLs: mod I (increased time)  Eating: unsure - hasn't tried  IADLs: Shopping: max A Light housekeeping: dependent Meal Prep: mod I for light meal prep;  Community mobility: dependent Medication management: independent Financial management: independent Handwriting: Increased time and 50% of prior level per pt report  MOBILITY STATUS: Independent  ACTIVITY TOLERANCE: Activity tolerance: fair  FUNCTIONAL OUTCOME MEASURES: PSFS: 5.0 total score   UPPER EXTREMITY ROM:    BUE: WNL  UPPER EXTREMITY MMT:     MMT Right (eval) Left (eval)  Shoulder flexion WNL 4/5  Shoulder abduction WNL 4/5  Elbow flexion WNL 4/5  Elbow extension WNL 4/5  (Blank rows = not tested)  HAND FUNCTION: Grip strength: Right: 49.6 lbs; Left: 19.6 lbs  COORDINATION: 9 Hole Peg test: Right: 19 sec; Left: 32 sec  SENSATION: WFL  EDEMA: none reported or observed  MUSCLE TONE: WFL  COGNITION: Overall cognitive status: Impaired and reports more difficulty with memory since hospitalization  VISION: Subjective report: no changes Baseline vision: Wears glasses all the time and progressive lenses Visual history: none  VISION ASSESSMENT: WFL  PERCEPTION: WFL  PRAXIS: Impaired: Motor planning  OBSERVATIONS: Pt ambulates with use of RW. No loss of balance. The pt appears well kept and has glasses donned.                                                                                                                          TREATMENT :    OT educated pt on rehabilitation process and results of objective measures in relation to pt specific goals.   OT educated pt on ST referral with pt agreeable to this.   PATIENT EDUCATION: Education details: OT role and POC; ST referral Person educated: Patient Education method: Explanation Education comprehension: verbalized understanding  HOME EXERCISE PROGRAM: N/A for  this visit  GOALS:  SHORT TERM GOALS: Target date: 03/31/2024  Patient will demonstrate initial L UE HEP with 25% verbal cues or less for proper execution. Baseline: Goal status: INITIAL  2.  Pt will report no more than mild difficulty cutting food utilizing L hand.  Baseline: has not tried Goal status: INITIAL   LONG TERM GOALS: Target date: 04/14/24   Patient will demonstrate updated L UE HEP with visual handouts only for proper execution. Baseline:  Goal status: INITIAL  2.  Patient will demo improved FM coordination as evidenced by completing nine-hole peg with use of L in 25 seconds or less. Baseline: Right: 19 sec; Left: 32 sec Goal status: INITIAL  3.  Patient will demonstrate at least 30 lbs L grip strength as needed to open jars and other containers. Baseline: Right: 49.6 lbs; Left: 19.6 lbs Goal status: INITIAL  4.  Patient will report at least two-point increase in average PSFS score or at least three-point increase in a single activity score indicating functionally significant improvement given minimum detectable change.  Baseline: 5.0 total score (See above for individual activity scores) Goal status: INITIAL  ASSESSMENT:  CLINICAL IMPRESSION: Patient is a 58 y.o. female who was seen today for occupational therapy evaluation for encephalopathy. Hx includes HTN, Sarcoidosis, hepatitis, migraines, LBP, Latent TB (positive QuantiFERON test) . Patient currently presents below baseline level of functioning demonstrating functional deficits and impairments as noted below. Pt would benefit from skilled OT services in the outpatient setting to work on impairments as noted below to help pt return to PLOF as able.    PERFORMANCE DEFICITS: in functional skills including ADLs, IADLs, coordination, strength, Fine motor control, endurance, decreased knowledge of use of DME, vision, and UE functional use and cognitive skills including memory.   IMPAIRMENTS: are limiting patient  from ADLs, IADLs, work, leisure, and social participation.   CO-MORBIDITIES: may have co-morbidities  that affects occupational performance. Patient will benefit from skilled OT to address above impairments and improve overall function.  MODIFICATION OR ASSISTANCE TO COMPLETE EVALUATION: Min-Moderate modification of tasks or assist with assess necessary to complete an evaluation.  OT OCCUPATIONAL PROFILE AND HISTORY: Detailed assessment: Review of records and additional review of physical, cognitive, psychosocial history related to current functional performance.  CLINICAL DECISION MAKING: Moderate - several treatment options, min-mod task modification necessary  REHAB POTENTIAL: Good  EVALUATION COMPLEXITY: Moderate    PLAN:  OT FREQUENCY: 2x/week  OT DURATION: 6 weeks  PLANNED INTERVENTIONS: 97168 OT Re-evaluation, 97535 self care/ADL training, 02889 therapeutic exercise, 97530 therapeutic activity, 97112 neuromuscular re-education, 97035 ultrasound, 97018 paraffin, 02960 fluidotherapy, 97010 moist heat, 97034 contrast bath, functional mobility training, coping strategies training, patient/family education, and DME and/or AE instructions  RECOMMENDED OTHER SERVICES: ST referral  CONSULTED AND AGREED WITH PLAN OF CARE: Patient  PLAN FOR NEXT SESSION: LUE coordination and putty HEPs   Jocelyn CHRISTELLA Bottom, OT 03/02/2024, 6:09 PM

## 2024-03-02 NOTE — Therapy (Signed)
 OUTPATIENT PHYSICAL THERAPY NEURO EVALUATION   Patient Name: Jackie Reed MRN: [id number] DOB:[birth year], 58 y.o., female Today's Date: 03/02/2024   PCP: Waylan Almarie SAUNDERS, MD REFERRING PROVIDER: Pegge Toribio PARAS, PA-C  END OF SESSION:  PT End of Session - 03/02/24 1541     Visit Number 1    Number of Visits 13   Plus eval   Date for PT Re-Evaluation 04/20/24    Authorization Type [employer]    PT Start Time 1539   Handoff w/OT   PT Stop Time 1610    PT Time Calculation (min) 31 min    Activity Tolerance Patient tolerated treatment well    Behavior During Therapy [facility] for tasks assessed/performed          Past Medical History:  Diagnosis Date   Dyspnea    occasional   E-coli UTI 08/2019   Hepatitis 05/2020   Hepatitis B core antibody positive   Hypertension    Migraine    Plantar fibromatosis    PONV (postoperative nausea and vomiting)    Pre-diabetes    Sarcoidosis    TB lung, latent    Thyroid  nodule 01/29/2023   seen on PET scan   Vitamin B12 deficiency 06/2019   Vitamin D  deficiency 06/2019   Past Surgical History:  Procedure Laterality Date   ABDOMINAL HYSTERECTOMY     FINE NEEDLE ASPIRATION  02/19/2023   Procedure: FINE NEEDLE ASPIRATION (FNA) LINEAR;  Surgeon: Brenna Adine CROME, DO;  Location: [facility];  Service: Cardiopulmonary;;   FOOT SURGERY Bilateral 1985   MASS EXCISION Right 05/19/2018   Procedure: EXCISION PALMAR NODULES RIGHT HAND;  Surgeon: Murrell Drivers, MD;  Location: [facility];  Service: Orthopedics;  Laterality: Right;   PARTIAL HYSTERECTOMY  1997   VIDEO BRONCHOSCOPY WITH ENDOBRONCHIAL ULTRASOUND Bilateral 02/19/2023   Procedure: VIDEO BRONCHOSCOPY WITH ENDOBRONCHIAL ULTRASOUND;  Surgeon: Brenna Adine CROME, DO;  Location: [facility];  Service: Cardiopulmonary;  Laterality: Bilateral;   Patient Active Problem List   Diagnosis Date Noted   Cognitive and neurobehavioral dysfunction 02/04/2024    Encephalopathy 01/28/2024   Encephalopathy, hypertensive 01/20/2024   Medication monitoring encounter 10/01/2023   TB lung, latent 08/27/2023   Sarcoidosis 04/22/2023   Adenopathy 02/09/2023   History of COVID-19 10/03/2020   Hepatitis B carrier (HCC) 07/03/2020   Mass of left hand 10/02/2019   Elevated blood pressure reading in office with diagnosis of hypertension 09/11/2019   Pre-diabetes 06/14/2019   Low back pain without sciatica 03/07/2019   Back muscle spasm 03/07/2019   History of recent fall 12/31/2018   Sprain of right ankle 11/18/2018   Class 2 severe obesity due to excess calories with serious comorbidity and body mass index (BMI) of 35.0 to 35.9 in adult [facility]) 10/31/2018   Essential hypertension, benign 11/08/2015   Tendon calcification 11/08/2015   Adjustment disorder with mixed anxiety and depressed mood 11/08/2015   Insomnia 11/08/2015    ONSET DATE: 02/03/2024 (referral)   REFERRING DIAG: G93.40 (ICD-10-CM) - Encephalopathy, unspecified  THERAPY DIAG:  Other abnormalities of gait and mobility  Unsteadiness on feet  Muscle weakness (generalized)  Rationale for Evaluation and Treatment: Rehabilitation  SUBJECTIVE:  SUBJECTIVE STATEMENT: Pt presents alone w/RW. States she has been slowly progressing since leaving the hospital. Denies falls. Has difficulty navigating stairs both inside and outside of the house. Has been working on picking up her left foot more and feels like this has improved since leaving the hospital. Still using the RW at all times.    Pt accompanied by: self  PERTINENT HISTORY: HTN, Sarcoidosis, hepatitis, migraines, LBP, Latent TB (positive QuantiFERON test)  PAIN:  Are you having pain? No  PRECAUTIONS: Fall  RED FLAGS: None   WEIGHT BEARING  RESTRICTIONS: No  FALLS: Has patient fallen in last 6 months? No  LIVING ENVIRONMENT: Lives with: lives with their spouse Lives in: House/apartment Stairs: Yes: Internal: 2 flights steps; one set has a rail on the right side going up, other flight has no rail and External: 2 steps; none Has following equipment at home: Walker - 2 wheeled and Grab bars  PLOF: Independent  PATIENT GOALS: To get the strength back on my L side so I can get back to being independent   OBJECTIVE:  Note: Objective measures were completed at Evaluation unless otherwise noted.  DIAGNOSTIC FINDINGS:  CT of head from 01/20/24 IMPRESSION: 1. No acute intracranial abnormality. 2. Generalized atrophy and findings of chronic microvascular disease.  MRI of brain from 01/20/24 IMPRESSION: 1. No acute intracranial abnormality. 2. Findings of chronic small vessel ischemia and volume loss.  MRI of C-Spine from 01/22/24 IMPRESSION: 1. Small central to left paracentral disc protrusions at C2-3 through C5-6 as above. Secondary minor cord flattening without cord signal changes or significant spinal stenosis. Findings could contribute to left upper extremity symptoms. 2. Small right paracentral disc protrusion at C6-7 without significant stenosis.   COGNITION: Overall cognitive status: Difficulty to assess due to: no family present   SENSATION: Pt denies numbness/tingling in all extremities    POSTURE: rounded shoulders, increased thoracic kyphosis, and weight shift left  LOWER EXTREMITY ROM:     Active  Right Eval Left Eval  Hip flexion    Hip extension    Hip abduction    Hip adduction    Hip internal rotation    Hip external rotation    Knee flexion    Knee extension    Ankle dorsiflexion    Ankle plantarflexion    Ankle inversion    Ankle eversion     (Blank rows = not tested)  LOWER EXTREMITY MMT:  Tested in seated position - inconsistent muscle contraction on LLE  MMT Right Eval  Left Eval  Hip flexion 5 4+  Hip extension    Hip abduction 5 4-  Hip adduction 5 4-  Hip internal rotation    Hip external rotation    Knee flexion 5 4+  Knee extension 5 4  Ankle dorsiflexion 5 4  Ankle plantarflexion    Ankle inversion    Ankle eversion    (Blank rows = not tested)  BED MOBILITY: Pt reports independence with this, sleeping in standard bed height   TRANSFERS: Sit to stand: Modified independence  Assistive device utilized: Environmental consultant - 2 wheeled     Stand to sit: Modified independence  Assistive device utilized: Environmental consultant - 2 wheeled      RAMP:  Not tested  CURB:  Not tested  STAIRS: Findings: Level of Assistance: SBA, Stair Negotiation Technique: Step to Pattern with Bilateral Rails, Number of Stairs: 4, Height of Stairs: 6   , and Comments: Pt required min cues to remember proper BLE  sequence to descent. No LOB noted, good eccentric control w/descent  GAIT: Gait pattern: step through pattern, decreased step length- Left, decreased stride length, decreased hip/knee flexion- Left, decreased ankle dorsiflexion- Left, lateral hip instability, wide BOS, and poor foot clearance- Left Distance walked: Various clinic distances  Assistive device utilized: Walker - 2 wheeled Level of assistance: Modified independence Comments: Pt ambulates slowly w/trend towards step-to pattern bilaterally. Pt w/increased stance time on LLE > RLE resulting in decreased step length/clearance of LLE.    FUNCTIONAL TESTS:   [facility] PT Assessment - 03/02/24 1600       Transfers   Five time sit to stand comments  24.16s w/BUE support   Trembling of LLE throughout, retropulsion on rep 4     Balance   Balance Assessed Yes      Standardized Balance Assessment   Standardized Balance Assessment 10 meter walk test    10 Meter Walk 0.41 m/s w/RW   38m over 24.4s w/RW, high fall risk          PATIENT SURVEYS:  ABC scale: The Activities-Specific Balance Confidence (ABC) Scale 0% 10 20 30   40 50 60 70 80 90 100% No confidence<->completely confident  "How confident are you that you will not lose your balance or become unsteady when you . . .   Date tested 03/02/24  Walk around the house 20%  2. Walk up or down stairs 10%  3. Bend over and pick up a slipper from in front of a closet floor 10%  4. Reach for a small can off a shelf at eye level 50%  5. Stand on tip toes and reach for something above your head 10%  6. Stand on a chair and reach for something 0%  7. Sweep the floor 10%  8. Walk outside the house to a car parked in the driveway 50%  9. Get into or out of a car 60%  10. Walk across a parking lot to the mall 10%  11. Walk up or down a ramp 20%  12. Walk in a crowded mall where people rapidly walk past you 10%  13. Are bumped into by people as you walk through the mall 50%  14. Step onto or off of an escalator while you are holding onto the railing 20%  15. Step onto or off an escalator while holding onto parcels such that you cannot hold onto the railing 10%  16. Walk outside on icy sidewalks 10%  Total: #/16 350/16 = 21.875%    VITALS  Vitals:   03/02/24 1553  BP: 103/77  Pulse: 80                                                                                                                         TREATMENT:   Self-care/home management  Assessed vitals (see above) and WNL  Reviewed proper stair sequencing technique as pt has multiple flights of steps at home  Discussed pt's goals and impact  that her self-confidence has on mobility   PATIENT EDUCATION: Education details: POC, eval findings, see self-care above  Person educated: Patient Education method: Explanation Education comprehension: verbalized understanding and needs further education  HOME EXERCISE PROGRAM: To be established   GOALS: Goals reviewed with patient? Yes  SHORT TERM GOALS: Target date: 03/30/2024    Pt will improve score on ABC scale to >/= 35% for improved confidence in  mobility and reduced fall risk  Baseline: 21.875% (7/10) Goal status: INITIAL  2.  Pt will ascend/descend 16 steps w/single rail on R w/step-through pattern and SBA for improved functional strength and safety in home environment  Baseline: SBA w/bilateral rails  Goal status: INITIAL  3.  Pt will improve gait velocity to at least 0.5 m/s w/LRAD for improved gait efficiency and reduced fall risk   Baseline: 0.41 m/s w/RW  Goal status: INITIAL  4.  Pt will improve 5 x STS to less than or equal to 22 seconds w/no UE support to demonstrate improved functional strength and transfer efficiency.   Baseline: 24.16s w/BUE support  Goal status: INITIAL   LONG TERM GOALS: Target date: 04/13/2024   Pt will ascend/descend 16 steps w/no handrails w/SBA for improved BLE strength and safety in home environment.  Baseline:  Goal status: INITIAL  2.  Pt will improve 5 x STS to less than or equal to 18 seconds w/no UE support to demonstrate improved functional strength and transfer efficiency.   Baseline: 24.16s w/BUE support  Goal status: INITIAL  3.  Pt will improve score on ABC Scale to >/= 50% for improved functional mobility and reduced fall risk  Baseline: 21.875% (7/10) Goal status: INITIAL  4.  Pt will improve gait velocity to at least 0.6 m/s w/LRAD for improved gait efficiency and reduced fall risk   Baseline: 0.41 m/s w/RW Goal status: INITIAL   ASSESSMENT:  CLINICAL IMPRESSION: Patient is a 58 year old female referred to Neuro OPPT for encephalopathy. Pt's PMH is significant for: HTN, Sarcoidosis, hepatitis, migraines, LBP, Latent TB (positive QuantiFERON test). The following deficits were present during the exam: decreased functional strength, decreased confidence in functional ability per her score on ABC Scale, decreased endurance and impaired gait kinematics. Based on gait speed and 5x STS, pt is an incr risk for falls. Pt would benefit from skilled PT to address these  impairments and functional limitations to maximize functional mobility independence.    OBJECTIVE IMPAIRMENTS: Abnormal gait, decreased activity tolerance, decreased balance, decreased knowledge of condition, decreased mobility, difficulty walking, decreased strength, decreased safety awareness, impaired perceived functional ability, and improper body mechanics.   ACTIVITY LIMITATIONS: carrying, lifting, squatting, stairs, and locomotion level  PARTICIPATION LIMITATIONS: meal prep, cleaning, laundry, driving, shopping, community activity, occupation, and yard work  PERSONAL FACTORS: Behavior pattern, Fitness, and 1 comorbidity: Encephalopathy vs Adjustment Disorder are also affecting patient's functional outcome.   REHAB POTENTIAL: Good  CLINICAL DECISION MAKING: Evolving/moderate complexity  EVALUATION COMPLEXITY: Moderate  PLAN:  PT FREQUENCY: 2x/week  PT DURATION: 6 weeks  PLANNED INTERVENTIONS: 97164- PT Re-evaluation, 97750- Physical Performance Testing, 97110-Therapeutic exercises, 97530- Therapeutic activity, [id number]- Neuromuscular re-education, 97535- Self Care, 02859- Manual therapy, [id number]- Gait training, [phone]- Aquatic Therapy, [phone]- Electrical stimulation (manual), [phone] (1-2 muscles), 20561 (3+ muscles)- Dry Needling, Patient/Family education, Balance training, Stair training, Joint mobilization, Spinal mobilization, Vestibular training, and DME instructions  PLAN FOR NEXT SESSION: Establish HEP for LLE NMR, sit to stands, endurance and confidence w/mobility. SciFit for endurance. STAIRS. Eccentric control, step clearance  w/LLE and gait w/reduced reliance on BUEs   Asaiah Hunnicutt E Laythan Hayter, PT, DPT 03/02/2024, 4:18 PM

## 2024-03-03 ENCOUNTER — Telehealth: Payer: Self-pay | Admitting: Occupational Therapy

## 2024-03-03 NOTE — Telephone Encounter (Signed)
 Hello,  Jackie Reed  was evaluated by OT on 03/03/2024.  The patient would benefit from ST evaluation for evaluation and treatment of memory changes following encephalopathy.    If you agree, please place an order in [facility] workque in [facility] or fax the order to [phone].  Thank you,  Jocelyn Bottom, OTR/L 03/03/2024 Occupational Therapy  [facility] [address] [place], [place]  [address] Phone:  [phone] Fax:  [phone]

## 2024-03-06 ENCOUNTER — Encounter: Payer: Self-pay | Admitting: Physical Medicine and Rehabilitation

## 2024-03-06 ENCOUNTER — Encounter: Attending: Physical Medicine and Rehabilitation | Admitting: Physical Medicine and Rehabilitation

## 2024-03-06 VITALS — BP 113/76 | HR 94 | Ht 64.0 in | Wt 212.0 lb

## 2024-03-06 DIAGNOSIS — I10 Essential (primary) hypertension: Secondary | ICD-10-CM | POA: Insufficient documentation

## 2024-03-06 DIAGNOSIS — I639 Cerebral infarction, unspecified: Secondary | ICD-10-CM | POA: Diagnosis not present

## 2024-03-06 DIAGNOSIS — Q6211 Congenital occlusion of ureteropelvic junction: Secondary | ICD-10-CM | POA: Insufficient documentation

## 2024-03-06 DIAGNOSIS — K5900 Constipation, unspecified: Secondary | ICD-10-CM | POA: Diagnosis present

## 2024-03-06 MED ORDER — AMLODIPINE BESYLATE 5 MG PO TABS: 5.0000 mg | ORAL_TABLET | Freq: Every day | ORAL | 3 refills | Status: DC

## 2024-03-06 MED ORDER — ASPIRIN 81 MG PO TBEC: 81.0000 mg | DELAYED_RELEASE_TABLET | Freq: Every day | ORAL | 3 refills | Status: AC

## 2024-03-06 MED ORDER — HYDROCHLOROTHIAZIDE 25 MG PO TABS: 25.0000 mg | ORAL_TABLET | Freq: Every day | ORAL | 3 refills | Status: DC

## 2024-03-06 NOTE — Progress Notes (Signed)
 Subjective:    Patient ID: Jackie Reed, female    DOB: 1966-04-27, 58 y.o.   MRN: [id number]  HPI Jackie Reed is a 58 year old woman who presents for hospital follow-up.  1) CVA -starting PT and OT outpatient -using RW -no falls -still experiencing left sided weakness -she is working on dragging her foot less  2) HTN: -BP has been well controlled   Pain Inventory Average Pain 0 Pain Right Now 0 My pain is No pain  LOCATION OF PAIN  No pain  BOWEL Number of stools per week: 2 Oral laxative use Senokot  BLADDER Normal   Mobility walk with assistance use a walker how many minutes can you walk? 10-15 mins ability to climb steps?  yes do you drive?  no Do you have any goals in this area?  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  yes  Neuro/Psych weakness trouble walking  Prior Studies Any changes since last visit?  no  Physicians involved in your care Any changes since last visit?  no   Family History  Problem Relation Age of Onset   Heart failure Mother    Diabetes Mother    Heart failure Father    Hypertension Father    Hyperlipidemia Father    Colon cancer Neg Hx    Colon polyps Neg Hx    Breast cancer Neg Hx    Crohn's disease Neg Hx    Esophageal cancer Neg Hx    Rectal cancer Neg Hx    Stomach cancer Neg Hx    Ulcerative colitis Neg Hx    Social History   Socioeconomic History   Marital status: Widowed    Spouse name: Not on file   Number of children: Not on file   Years of education: Not on file   Highest education level: Not on file  Occupational History   Not on file  Tobacco Use   Smoking status: Never   Smokeless tobacco: Never  Vaping Use   Vaping status: Never Used  Substance and Sexual Activity   Alcohol use: No    Alcohol/week: 0.0 standard drinks of alcohol   Drug use:  No   Sexual activity: Yes    Birth control/protection: Surgical    Comment: Hysterectomy  Other Topics Concern   Not on file  Social History Narrative   Not on file   Social Drivers of Health   Financial Resource Strain: Low Risk  (09/28/2023)   Received from [facility]   Overall Financial Resource Strain (CARDIA)    Difficulty of Paying Living Expenses: Not very hard  Food Insecurity: No Food Insecurity (01/19/2024)   Hunger Vital Sign    Worried About Running Out of Food in the Last Year: Never true    Ran Out of Food in the Last Year: Never true  Transportation Needs: No Transportation Needs (01/19/2024)   PRAPARE - Administrator, Civil Service (Medical): No    Lack of Transportation (Non-Medical): No  Physical Activity: Not on file  Stress: Not on file  Social Connections: Unknown (12/22/2021)   Received from [facility]   Social Network    Social Network: Not on file   Past Surgical History:  Procedure Laterality Date   ABDOMINAL HYSTERECTOMY     FINE NEEDLE ASPIRATION  02/19/2023  Procedure: FINE NEEDLE ASPIRATION (FNA) LINEAR;  Surgeon: Brenna Adine CROME, DO;  Location: [facility];  Service: Cardiopulmonary;;   FOOT SURGERY Bilateral 1985   MASS EXCISION Right 05/19/2018   Procedure: EXCISION PALMAR NODULES RIGHT HAND;  Surgeon: Murrell Drivers, MD;  Location: [facility];  Service: Orthopedics;  Laterality: Right;   PARTIAL HYSTERECTOMY  1997   VIDEO BRONCHOSCOPY WITH ENDOBRONCHIAL ULTRASOUND Bilateral 02/19/2023   Procedure: VIDEO BRONCHOSCOPY WITH ENDOBRONCHIAL ULTRASOUND;  Surgeon: Brenna Adine CROME, DO;  Location: [facility];  Service: Cardiopulmonary;  Laterality: Bilateral;   Past Medical History:  Diagnosis Date   Dyspnea    occasional   E-coli UTI 08/2019   Hepatitis 05/2020   Hepatitis B core antibody positive   Hypertension    Migraine    Plantar fibromatosis    PONV (postoperative nausea and vomiting)    Pre-diabetes     Sarcoidosis    TB lung, latent    Thyroid  nodule 01/29/2023   seen on PET scan   Vitamin B12 deficiency 06/2019   Vitamin D  deficiency 06/2019   Ht 5' 4 (1.626 m)   Wt 212 lb (96.2 kg)   BMI 36.39 kg/m   Opioid Risk Score:   Fall Risk Score:  `1  Depression screen [facility] 2/9     03/06/2024    3:35 PM 08/27/2023    9:06 AM 05/27/2020   11:09 AM 09/05/2019    3:41 PM 05/15/2019    3:34 PM 03/07/2019    2:22 PM 12/30/2018   10:39 AM  Depression screen PHQ 2/9  Decreased Interest 0 0 0 0 0 0 0  Down, Depressed, Hopeless 0 0 0 0 0 0 0  PHQ - 2 Score 0 0 0 0 0 0 0  Altered sleeping 0        Tired, decreased energy 0        Change in appetite 0        Feeling bad or failure about yourself  0        Trouble concentrating 0        Moving slowly or fidgety/restless 0        Suicidal thoughts 0        PHQ-9 Score 0          Review of Systems  Musculoskeletal:  Positive for gait problem.  Neurological:  Positive for weakness.  All other systems reviewed and are negative.      Objective:   Physical Exam Gen: no distress, normal appearing HEENT: oral mucosa pink and moist, NCAT Cardio: Reg rate Chest: normal effort, normal rate of breathing Abd: soft, non-distended Ext: no edema Psych: pleasant, normal affect Skin: intact Neuro: Alert and oriented x3, left sided weakness     Assessment & Plan:   1) CVA:  -aspirin  81 mg daily refilled -handicap placard provided -continue PT and OT -discussed that I can help her with her short term disability paperwork -start SLP  2) HTN: -BP is 113/76 today.  -Advised checking BP daily at home and logging results to bring into follow-up appointment with PCP and myself. -Reviewed BP meds today.  -Advised regarding healthy foods that can help lower blood pressure and provided with a list: 1) citrus foods- high in vitamins and minerals 2) salmon and other fatty fish - reduces inflammation and oxylipins 3) swiss chard (leafy green)- high  level of nitrates 4) pumpkin seeds- one of the best natural sources of magnesium  5) Beans and lentils- high  in fiber, magnesium , and potassium 6) Berries- high in flavonoids 7) Amaranth (whole grain, can be cooked similarly to rice and oats)- high in magnesium  and fiber 8) Pistachios- even more effective at reducing BP than other nuts 9) Carrots- high in phenolic compounds that relax blood vessels and reduce inflammation 10) Celery- contain phthalides that relax tissues of arterial walls 11) Tomatoes- can also improve cholesterol and reduce risk of heart disease 12) Broccoli- good source of magnesium , calcium , and potassium 13) Greek yogurt: high in potassium and calcium  14) Herbs and spices: Celery seed, cilantro, saffron, lemongrass, black cumin, ginseng, cinnamon, cardamom, sweet basil, and ginger 15) Chia and flax seeds- also help to lower cholesterol and blood sugar 16) Beets- high levels of nitrates that relax blood vessels  17) spinach and bananas- high in potassium  -Provided lise of supplements that can help with hypertension:  1) magnesium : one high quality brand is Bioptemizers since it contains all 7 types of magnesium , otherwise over the counter magnesium  gluconate 400mg  is a good option 2) B vitamins 3) vitamin D  4) potassium 5) CoQ10 6) L-arginine 7) Vitamin C 8) Beetroot -Educated that goal BP is 120/80. -Made goal to incorporate some of the above foods into diet.    3) Constipation:  -Provided list of following foods that help with constipation and highlighted a few: 1) prunes- contain high amounts of fiber.  2) apples- has a form of dietary fiber called pectin that accelerates stool movement and increases beneficial gut bacteria 3) pears- in addition to fiber, also high in fructose and sorbitol  which have laxative effect 4) figs- contain an enzyme ficin which helps to speed colonic transit 5) kiwis- contain an enzyme actinidin that improves gut motility and reduces  constipation 6) oranges- rich in pectin (like apples) 7) grapefruits- contain a flavanol naringenin which has a laxative effect 8) vegetables- rich in fiber and also great sources of folate, vitamin C, and K 9) artichoke- high in inulin, prebiotic great for the microbiome 10) chicory- increases stool frequency and softness (can be added to coffee) 11) rhubarb- laxative effect 12) sweet potato- high fiber 13) beans, peas, and lentils- contain both soluble and insoluble fiber 14) chia seeds- improves intestinal health and gut flora 15) flaxseeds- laxative effect 16) whole grain rye bread- high in fiber 17) oat bran- high in soluble and insoluble fiber 18) kefir- softens stools -recommended to try at least one of these foods every day.  -drink 6-8 glasses of water per day -walk regularly, especially after meals.   4) UPJ stenosis: -discussed that urology will follow-up with her on August 1st -discussed that she sometimes has muscle spasms and baclofen  helps

## 2024-03-06 NOTE — Patient Instructions (Signed)
 HTN: Advised checking BP daily at home and logging results to bring into follow-up appointment with PCP and myself. -Reviewed BP meds today.  -Advised regarding healthy foods that can help lower blood pressure and provided with a list: 1) citrus foods- high in vitamins and minerals 2) salmon and other fatty fish - reduces inflammation and oxylipins 3) swiss chard (leafy green)- high level of nitrates 4) pumpkin seeds- one of the best natural sources of magnesium 5) Beans and lentils- high in fiber, magnesium, and potassium 6) Berries- high in flavonoids 7) Amaranth (whole grain, can be cooked similarly to rice and oats)- high in magnesium and fiber 8) Pistachios- even more effective at reducing BP than other nuts 9) Carrots- high in phenolic compounds that relax blood vessels and reduce inflammation 10) Celery- contain phthalides that relax tissues of arterial walls 11) Tomatoes- can also improve cholesterol and reduce risk of heart disease 12) Broccoli- good source of magnesium, calcium, and potassium 13) Greek yogurt: high in potassium and calcium 14) Herbs and spices: Celery seed, cilantro, saffron, lemongrass, black cumin, ginseng, cinnamon, cardamom, sweet basil, and ginger 15) Chia and flax seeds- also help to lower cholesterol and blood sugar 16) Beets- high levels of nitrates that relax blood vessels  17) spinach and bananas- high in potassium  -Provided lise of supplements that can help with hypertension:  1) magnesium: one high quality brand is Bioptemizers since it contains all 7 types of magnesium, otherwise over the counter magnesium gluconate 400mg  is a good option 2) B vitamins 3) vitamin D 4) potassium 5) CoQ10 6) L-arginine 7) Vitamin C 8) Beetroot -Educated that goal BP is 120/80. -Made goal to incorporate some of the above foods into diet.

## 2024-03-07 ENCOUNTER — Other Ambulatory Visit: Payer: Self-pay | Admitting: Physical Medicine and Rehabilitation

## 2024-03-07 ENCOUNTER — Ambulatory Visit: Admitting: Occupational Therapy

## 2024-03-07 DIAGNOSIS — G9340 Encephalopathy, unspecified: Secondary | ICD-10-CM

## 2024-03-07 DIAGNOSIS — R41844 Frontal lobe and executive function deficit: Secondary | ICD-10-CM

## 2024-03-07 DIAGNOSIS — R4189 Other symptoms and signs involving cognitive functions and awareness: Secondary | ICD-10-CM

## 2024-03-07 DIAGNOSIS — R278 Other lack of coordination: Secondary | ICD-10-CM

## 2024-03-07 DIAGNOSIS — R29818 Other symptoms and signs involving the nervous system: Secondary | ICD-10-CM

## 2024-03-07 DIAGNOSIS — M6281 Muscle weakness (generalized): Secondary | ICD-10-CM

## 2024-03-07 DIAGNOSIS — R29898 Other symptoms and signs involving the musculoskeletal system: Secondary | ICD-10-CM

## 2024-03-07 NOTE — Therapy (Signed)
 OUTPATIENT OCCUPATIONAL THERAPY NEURO TREATMENT  Patient Name: Jackie Reed MRN: [id number] DOB:March 10, 1966, 58 y.o., female Today's Date: 03/07/2024  PCP: Waylan Almarie SAUNDERS, MD  REFERRING PROVIDER: Pegge Toribio PARAS, PA-C  END OF SESSION:  OT End of Session - 03/07/24 1612     Visit Number 2    Number of Visits 13    Date for OT Re-Evaluation 04/14/24    Authorization Type [employer]    OT Start Time 1620    OT Stop Time 1659    OT Time Calculation (min) 39 min    Activity Tolerance Patient tolerated treatment well    Behavior During Therapy [facility] for tasks assessed/performed         Past Medical History:  Diagnosis Date   Dyspnea    occasional   E-coli UTI 08/2019   Hepatitis 05/2020   Hepatitis B core antibody positive   Hypertension    Migraine    Plantar fibromatosis    PONV (postoperative nausea and vomiting)    Pre-diabetes    Sarcoidosis    TB lung, latent    Thyroid  nodule 01/29/2023   seen on PET scan   Vitamin B12 deficiency 06/2019   Vitamin D  deficiency 06/2019   Past Surgical History:  Procedure Laterality Date   ABDOMINAL HYSTERECTOMY     FINE NEEDLE ASPIRATION  02/19/2023   Procedure: FINE NEEDLE ASPIRATION (FNA) LINEAR;  Surgeon: Brenna Adine CROME, DO;  Location: [facility];  Service: Cardiopulmonary;;   FOOT SURGERY Bilateral 1985   MASS EXCISION Right 05/19/2018   Procedure: EXCISION PALMAR NODULES RIGHT HAND;  Surgeon: Murrell Drivers, MD;  Location: [facility];  Service: Orthopedics;  Laterality: Right;   PARTIAL HYSTERECTOMY  1997   VIDEO BRONCHOSCOPY WITH ENDOBRONCHIAL ULTRASOUND Bilateral 02/19/2023   Procedure: VIDEO BRONCHOSCOPY WITH ENDOBRONCHIAL ULTRASOUND;  Surgeon: Brenna Adine CROME, DO;  Location: [facility];  Service: Cardiopulmonary;  Laterality: Bilateral;   Patient Active Problem List   Diagnosis Date Noted   Cognitive and neurobehavioral dysfunction 02/04/2024   Encephalopathy 01/28/2024   Encephalopathy,  hypertensive 01/20/2024   Medication monitoring encounter 10/01/2023   TB lung, latent 08/27/2023   Sarcoidosis 04/22/2023   Adenopathy 02/09/2023   History of COVID-19 10/03/2020   Hepatitis B carrier (HCC) 07/03/2020   Mass of left hand 10/02/2019   Elevated blood pressure reading in office with diagnosis of hypertension 09/11/2019   Pre-diabetes 06/14/2019   Low back pain without sciatica 03/07/2019   Back muscle spasm 03/07/2019   History of recent fall 12/31/2018   Sprain of right ankle 11/18/2018   Class 2 severe obesity due to excess calories with serious comorbidity and body mass index (BMI) of 35.0 to 35.9 in adult [facility]) 10/31/2018   Essential hypertension, benign 11/08/2015   Tendon calcification 11/08/2015   Adjustment disorder with mixed anxiety and depressed mood 11/08/2015   Insomnia 11/08/2015    ONSET DATE: 02/04/2024 (Date of referral)  REFERRING DIAG: G93.40 (ICD-10-CM) - Encephalopathy, unspecified   THERAPY DIAG:  Muscle weakness (generalized)  Other lack of coordination  Frontal lobe and executive function deficit  Other symptoms and signs involving the musculoskeletal system  Other symptoms and signs involving the nervous system  Encephalopathy, unspecified type  Rationale for Evaluation and Treatment: Rehabilitation  SUBJECTIVE:   SUBJECTIVE STATEMENT: Pt reports she saw PM&R yesterday.   Pt accompanied by: self  PERTINENT HISTORY: PMH: HTN, Sarcoidosis, hepatitis, migraines, LBP, Latent TB (positive QuantiFERON test)   PRECAUTIONS: Fall  WEIGHT BEARING RESTRICTIONS:  No  PAIN:  Are you having pain? No  FALLS: Has patient fallen in last 6 months? No  LIVING ENVIRONMENT: Lives with: lives with their spouse Lives in: House/apartment Stairs: Yes: Internal: splint level steps; on right going up and no rail to go downstairs and External: 2 steps; none Has following equipment at home: Vannie - 2 wheeled  PLOF: Independent; driving; phone  scheduler with computer work; reading; puzzles (crosswords)  PATIENT GOALS: return to work/PLOF  OBJECTIVE:  Note: Objective measures were completed at Evaluation unless otherwise noted.  HAND DOMINANCE: Left  ADLs: Overall ADLs: mod I (increased time)  Eating: unsure - hasn't tried  IADLs: Shopping: max A Light housekeeping: dependent Meal Prep: mod I for light meal prep;  Community mobility: dependent Medication management: independent Financial management: independent Handwriting: Increased time and 50% of prior level per pt report  MOBILITY STATUS: Independent  ACTIVITY TOLERANCE: Activity tolerance: fair  FUNCTIONAL OUTCOME MEASURES: PSFS: 5.0 total score   UPPER EXTREMITY ROM:    BUE: WNL  UPPER EXTREMITY MMT:     MMT Right (eval) Left (eval)  Shoulder flexion WNL 4/5  Shoulder abduction WNL 4/5  Elbow flexion WNL 4/5  Elbow extension WNL 4/5  (Blank rows = not tested)  HAND FUNCTION: Grip strength: Right: 49.6 lbs; Left: 19.6 lbs  COORDINATION: 9 Hole Peg test: Right: 19 sec; Left: 32 sec  SENSATION: WFL  EDEMA: none reported or observed  MUSCLE TONE: WFL  COGNITION: Overall cognitive status: Impaired and reports more difficulty with memory since hospitalization  VISION: Subjective report: no changes Baseline vision: Wears glasses all the time and progressive lenses Visual history: none  VISION ASSESSMENT: WFL  PERCEPTION: WFL  PRAXIS: Impaired: Motor planning  OBSERVATIONS: Pt ambulates with use of RW. No loss of balance. The pt appears well kept and has glasses donned.                                                                                                                          TREATMENT :    OT initiated FM coordination HEP (handout provided, see pt instructions) - to improve LUE FM coordination, dexterity, proprioception. Pt returned demonstration of each exercise: Picking up objects and placing in a container with  slot Stacking towers of coins  Finger-to-palm then palm-to-finger translation of small items Shuffling cards Turning cards over 1 at a time Hold deck of cards in palm of hand and push off 1 card at a time from the top of the deck using only thumb Tear piece of tissue and rolling into small balls with fingertips Meditation balls - clockwise then counterclockwise Rolling pen between fingers and thumb, pen twirl (rotation), pen shift (translation)   OT initiated LUE red theraputty exercises (search, grip, and pinch) as noted in patient instructions for coordination and strength OT educated pt on table top play of Golf Solitaire for LUE to address fine motor coordination, scanning and locating of items, processing, bimanual coordination/trunk control,  sequencing of unfamiliar motor movements or tasks, and motor planning. Pt required moderate cues for proper play.     PATIENT EDUCATION: Education details: putty and coordination HEPs Person educated: Patient Education method: Explanation, Demonstration, Verbal cues, and Handouts Education comprehension: verbalized understanding, returned demonstration, verbal cues required, and needs further education  HOME EXERCISE PROGRAM: 03/07/2024: putty and coordination HEPs  GOALS:  SHORT TERM GOALS: Target date: 03/31/2024  Patient will demonstrate initial L UE HEP with 25% verbal cues or less for proper execution. Baseline: Goal status: IN PROGRESS  2.  Pt will report no more than mild difficulty cutting food utilizing L hand.  Baseline: has not tried Goal status: INITIAL   LONG TERM GOALS: Target date: 04/14/24   Patient will demonstrate updated L UE HEP with visual handouts only for proper execution. Baseline:  Goal status: INITIAL  2.  Patient will demo improved FM coordination as evidenced by completing nine-hole peg with use of L in 25 seconds or less. Baseline: Right: 19 sec; Left: 32 sec Goal status: INITIAL  3.  Patient will  demonstrate at least 30 lbs L grip strength as needed to open jars and other containers. Baseline: Right: 49.6 lbs; Left: 19.6 lbs Goal status: INITIAL  4.  Patient will report at least two-point increase in average PSFS score or at least three-point increase in a single activity score indicating functionally significant improvement given minimum detectable change.  Baseline: 5.0 total score (See above for individual activity scores) Goal status: INITIAL  ASSESSMENT:  CLINICAL IMPRESSION: Patient demonstrates good understanding of HEP though requires cues for sequencing and memory, especially with golf solitaire activity. May benefit from ST referral to further evaluate and help address cognitive impairments.   PERFORMANCE DEFICITS: in functional skills including ADLs, IADLs, coordination, strength, Fine motor control, endurance, decreased knowledge of use of DME, vision, and UE functional use and cognitive skills including memory.   IMPAIRMENTS: are limiting patient from ADLs, IADLs, work, leisure, and social participation.   CO-MORBIDITIES: may have co-morbidities  that affects occupational performance. Patient will benefit from skilled OT to address above impairments and improve overall function.  REHAB POTENTIAL: Good  PLAN:  OT FREQUENCY: 2x/week  OT DURATION: 6 weeks  PLANNED INTERVENTIONS: 97168 OT Re-evaluation, 97535 self care/ADL training, 02889 therapeutic exercise, 97530 therapeutic activity, 97112 neuromuscular re-education, 97035 ultrasound, 97018 paraffin, 02960 fluidotherapy, 97010 moist heat, 97034 contrast bath, functional mobility training, coping strategies training, patient/family education, and DME and/or AE instructions  RECOMMENDED OTHER SERVICES: ST referral  CONSULTED AND AGREED WITH PLAN OF CARE: Patient  PLAN FOR NEXT SESSION: review LUE coordination and putty HEPs and golf solitaire; [email or web address]   Jocelyn CHRISTELLA Bottom, OT 03/07/2024, 5:25 PM

## 2024-03-09 ENCOUNTER — Ambulatory Visit: Admitting: Physical Therapy

## 2024-03-09 ENCOUNTER — Encounter: Payer: Self-pay | Admitting: Physical Therapy

## 2024-03-09 DIAGNOSIS — R2681 Unsteadiness on feet: Secondary | ICD-10-CM

## 2024-03-09 DIAGNOSIS — R29898 Other symptoms and signs involving the musculoskeletal system: Secondary | ICD-10-CM

## 2024-03-09 DIAGNOSIS — R278 Other lack of coordination: Secondary | ICD-10-CM

## 2024-03-09 DIAGNOSIS — R29818 Other symptoms and signs involving the nervous system: Secondary | ICD-10-CM

## 2024-03-09 DIAGNOSIS — R2689 Other abnormalities of gait and mobility: Secondary | ICD-10-CM

## 2024-03-09 DIAGNOSIS — M6281 Muscle weakness (generalized): Secondary | ICD-10-CM

## 2024-03-09 NOTE — Therapy (Signed)
 OUTPATIENT PHYSICAL THERAPY NEURO TREATMENT   Patient Name: Jackie Reed MRN: [id number] DOB:Oct 22, 1965, 58 y.o., female Today's Date: 03/09/2024   PCP: Waylan Almarie SAUNDERS, MD REFERRING PROVIDER: Pegge Toribio PARAS, PA-C  END OF SESSION:  PT End of Session - 03/09/24 1539     Visit Number 2    Number of Visits 13   Plus eval   Date for PT Re-Evaluation 04/20/24    Authorization Type [employer]    PT Start Time 1535    PT Stop Time 1615    PT Time Calculation (min) 40 min    Equipment Utilized During Treatment Gait belt    Activity Tolerance Patient tolerated treatment well    Behavior During Therapy WFL for tasks assessed/performed          Past Medical History:  Diagnosis Date   Dyspnea    occasional   E-coli UTI 08/2019   Hepatitis 05/2020   Hepatitis B core antibody positive   Hypertension    Migraine    Plantar fibromatosis    PONV (postoperative nausea and vomiting)    Pre-diabetes    Sarcoidosis    TB lung, latent    Thyroid  nodule 01/29/2023   seen on PET scan   Vitamin B12 deficiency 06/2019   Vitamin D  deficiency 06/2019   Past Surgical History:  Procedure Laterality Date   ABDOMINAL HYSTERECTOMY     FINE NEEDLE ASPIRATION  02/19/2023   Procedure: FINE NEEDLE ASPIRATION (FNA) LINEAR;  Surgeon: Brenna Adine CROME, DO;  Location: [facility];  Service: Cardiopulmonary;;   FOOT SURGERY Bilateral 1985   MASS EXCISION Right 05/19/2018   Procedure: EXCISION PALMAR NODULES RIGHT HAND;  Surgeon: Murrell Drivers, MD;  Location: [facility];  Service: Orthopedics;  Laterality: Right;   PARTIAL HYSTERECTOMY  1997   VIDEO BRONCHOSCOPY WITH ENDOBRONCHIAL ULTRASOUND Bilateral 02/19/2023   Procedure: VIDEO BRONCHOSCOPY WITH ENDOBRONCHIAL ULTRASOUND;  Surgeon: Brenna Adine CROME, DO;  Location: [facility];  Service: Cardiopulmonary;  Laterality: Bilateral;   Patient Active Problem List   Diagnosis Date Noted   Cognitive and neurobehavioral  dysfunction 02/04/2024   Encephalopathy 01/28/2024   Encephalopathy, hypertensive 01/20/2024   Medication monitoring encounter 10/01/2023   TB lung, latent 08/27/2023   Sarcoidosis 04/22/2023   Adenopathy 02/09/2023   History of COVID-19 10/03/2020   Hepatitis B carrier (HCC) 07/03/2020   Mass of left hand 10/02/2019   Elevated blood pressure reading in office with diagnosis of hypertension 09/11/2019   Pre-diabetes 06/14/2019   Low back pain without sciatica 03/07/2019   Back muscle spasm 03/07/2019   History of recent fall 12/31/2018   Sprain of right ankle 11/18/2018   Class 2 severe obesity due to excess calories with serious comorbidity and body mass index (BMI) of 35.0 to 35.9 in adult [facility]) 10/31/2018   Essential hypertension, benign 11/08/2015   Tendon calcification 11/08/2015   Adjustment disorder with mixed anxiety and depressed mood 11/08/2015   Insomnia 11/08/2015    ONSET DATE: 02/03/2024 (referral)   REFERRING DIAG: G93.40 (ICD-10-CM) - Encephalopathy, unspecified  THERAPY DIAG:  Muscle weakness (generalized)  Other lack of coordination  Other symptoms and signs involving the musculoskeletal system  Other symptoms and signs involving the nervous system  Other abnormalities of gait and mobility  Unsteadiness on feet  Rationale for Evaluation and Treatment: Rehabilitation  SUBJECTIVE:  SUBJECTIVE STATEMENT: Pt presents w/RW. She denies falls. Still using the RW at all times.  No acute issues.   Pt accompanied by: self - husband Darin (waiting in lobby)  PERTINENT HISTORY: HTN, Sarcoidosis, hepatitis, migraines, LBP, Latent TB (positive QuantiFERON test)  PAIN:  Are you having pain? No  PRECAUTIONS: Fall  RED FLAGS: None   WEIGHT BEARING RESTRICTIONS:  No  FALLS: Has patient fallen in last 6 months? No  LIVING ENVIRONMENT: Lives with: lives with their spouse Lives in: House/apartment Stairs: Yes: Internal: 2 flights steps; one set has a rail on the right side going up, other flight has no rail and External: 2 steps; none Has following equipment at home: Walker - 2 wheeled and Grab bars  PLOF: Independent  PATIENT GOALS: To get the strength back on my L side so I can get back to being independent   OBJECTIVE:  Note: Objective measures were completed at Evaluation unless otherwise noted.  DIAGNOSTIC FINDINGS:  CT of head from 01/20/24 IMPRESSION: 1. No acute intracranial abnormality. 2. Generalized atrophy and findings of chronic microvascular disease.  MRI of brain from 01/20/24 IMPRESSION: 1. No acute intracranial abnormality. 2. Findings of chronic small vessel ischemia and volume loss.  MRI of C-Spine from 01/22/24 IMPRESSION: 1. Small central to left paracentral disc protrusions at C2-3 through C5-6 as above. Secondary minor cord flattening without cord signal changes or significant spinal stenosis. Findings could contribute to left upper extremity symptoms. 2. Small right paracentral disc protrusion at C6-7 without significant stenosis.   COGNITION: Overall cognitive status: Difficulty to assess due to: no family present   SENSATION: Pt denies numbness/tingling in all extremities    POSTURE: rounded shoulders, increased thoracic kyphosis, and weight shift left  LOWER EXTREMITY ROM:     Active  Right Eval Left Eval  Hip flexion    Hip extension    Hip abduction    Hip adduction    Hip internal rotation    Hip external rotation    Knee flexion    Knee extension    Ankle dorsiflexion    Ankle plantarflexion    Ankle inversion    Ankle eversion     (Blank rows = not tested)  LOWER EXTREMITY MMT:  Tested in seated position - inconsistent muscle contraction on LLE  MMT Right Eval Left Eval  Hip  flexion 5 4+  Hip extension    Hip abduction 5 4-  Hip adduction 5 4-  Hip internal rotation    Hip external rotation    Knee flexion 5 4+  Knee extension 5 4  Ankle dorsiflexion 5 4  Ankle plantarflexion    Ankle inversion    Ankle eversion    (Blank rows = not tested)  BED MOBILITY: Pt reports independence with this, sleeping in standard bed height   TRANSFERS: Sit to stand: Modified independence  Assistive device utilized: Environmental consultant - 2 wheeled     Stand to sit: Modified independence  Assistive device utilized: Environmental consultant - 2 wheeled      RAMP:  Not tested  CURB:  Not tested  STAIRS: Findings: Level of Assistance: SBA, Stair Negotiation Technique: Step to Pattern with Bilateral Rails, Number of Stairs: 4, Height of Stairs: 6   , and Comments: Pt required min cues to remember proper BLE sequence to descent. No LOB noted, good eccentric control w/descent  GAIT: Gait pattern: step through pattern, decreased step length- Left, decreased stride length, decreased hip/knee flexion- Left, decreased ankle dorsiflexion- Left,  lateral hip instability, wide BOS, and poor foot clearance- Left Distance walked: Various clinic distances  Assistive device utilized: Walker - 2 wheeled Level of assistance: Modified independence Comments: Pt ambulates slowly w/trend towards step-to pattern bilaterally. Pt w/increased stance time on LLE > RLE resulting in decreased step length/clearance of LLE.    FUNCTIONAL TESTS:      PATIENT SURVEYS:  ABC scale: The Activities-Specific Balance Confidence (ABC) Scale 0% 10 20 30  40 50 60 70 80 90 100% No confidence<->completely confident  "How confident are you that you will not lose your balance or become unsteady when you . . .   Date tested 03/02/24  Walk around the house 20%  2. Walk up or down stairs 10%  3. Bend over and pick up a slipper from in front of a closet floor 10%  4. Reach for a small can off a shelf at eye level 50%  5. Stand on tip  toes and reach for something above your head 10%  6. Stand on a chair and reach for something 0%  7. Sweep the floor 10%  8. Walk outside the house to a car parked in the driveway 50%  9. Get into or out of a car 60%  10. Walk across a parking lot to the mall 10%  11. Walk up or down a ramp 20%  12. Walk in a crowded mall where people rapidly walk past you 10%  13. Are bumped into by people as you walk through the mall 50%  14. Step onto or off of an escalator while you are holding onto the railing 20%  15. Step onto or off an escalator while holding onto parcels such that you cannot hold onto the railing 10%  16. Walk outside on icy sidewalks 10%  Total: #/16 350/16 = 21.875%    VITALS  There were no vitals filed for this visit.                                                                                                                        TREATMENT:   -STS x10 w/ BUE support - focus on eccentric lower -Standing march x20, slow pace, good ROM and upright posture throughout -Quarter turns left and right x10 each direction, cues to march knee up improves ability to pick up and place LLE -Standing hip abduction at countertop x15 each side, cued for upright posture -Stairs using B rails using step to pattern w/ RLE leading during ascent and LLE leading during descent - edu on continuing to use this to practice and build confidence with stairs as this is safest for her, she does have intermittent improved LLE clearance during last round of ascent - performed 3x4 6 stairs SBA -SciFit double peak mode up to level 4.0 over 8 minutes using BUE/BLE w/ pt reporting consistent LLE challenge throughout task  PATIENT EDUCATION: Education details: Initial HEP.  Schedule ST eval - clinic received referral. Person educated: Patient Education method: Explanation  Education comprehension: verbalized understanding and needs further education  HOME EXERCISE PROGRAM: Access Code: [email or web address] URL:  [email or web address] Date: 03/09/2024 Prepared by: Daved Bull  Exercises - Sit to Stand with Armchair  - 1 x daily - 7 x weekly - 2 sets - 10 reps - Standing March with Counter Support  - 1 x daily - 4 x weekly - 2 sets - 10 reps - Standing Quarter Turn with Counter Support  - 1 x daily - 4 x weekly - 1-2 sets - 10 reps - Standing Hip Abduction with Counter Support  - 1 x daily - 4 x weekly - 2 sets - 10 reps  GOALS: Goals reviewed with patient? Yes  SHORT TERM GOALS: Target date: 03/30/2024    Pt will improve score on ABC scale to >/= 35% for improved confidence in mobility and reduced fall risk  Baseline: 21.875% (7/10) Goal status: INITIAL  2.  Pt will ascend/descend 16 steps w/single rail on R w/step-through pattern and SBA for improved functional strength and safety in home environment  Baseline: SBA w/bilateral rails  Goal status: INITIAL  3.  Pt will improve gait velocity to at least 0.5 m/s w/LRAD for improved gait efficiency and reduced fall risk   Baseline: 0.41 m/s w/RW  Goal status: INITIAL  4.  Pt will improve 5 x STS to less than or equal to 22 seconds w/no UE support to demonstrate improved functional strength and transfer efficiency.   Baseline: 24.16s w/BUE support  Goal status: INITIAL   LONG TERM GOALS: Target date: 04/13/2024   Pt will ascend/descend 16 steps w/no handrails w/SBA for improved BLE strength and safety in home environment.  Baseline:  Goal status: INITIAL  2.  Pt will improve 5 x STS to less than or equal to 18 seconds w/no UE support to demonstrate improved functional strength and transfer efficiency.   Baseline: 24.16s w/BUE support  Goal status: INITIAL  3.  Pt will improve score on ABC Scale to >/= 50% for improved functional mobility and reduced fall risk  Baseline: 21.875% (7/10) Goal status: INITIAL  4.  Pt will improve gait velocity to at least 0.6 m/s w/LRAD for improved gait efficiency and reduced fall  risk   Baseline: 0.41 m/s w/RW Goal status: INITIAL   ASSESSMENT:  CLINICAL IMPRESSION: Emphasis of skilled PT session today on establishing an introductory HEP to strengthen the LLE and build confidence in difficult tasks requiring LLE clearance like turning.  She intermittently has improved clearance of left step throughout level ambulation and stair management with PT encouraging continued step to pattern for safety during home and community practice.  She continues to benefit from skilled PT to progress gait mechanics and achieve higher level of independence.  Continue per POC.  OBJECTIVE IMPAIRMENTS: Abnormal gait, decreased activity tolerance, decreased balance, decreased knowledge of condition, decreased mobility, difficulty walking, decreased strength, decreased safety awareness, impaired perceived functional ability, and improper body mechanics.   ACTIVITY LIMITATIONS: carrying, lifting, squatting, stairs, and locomotion level  PARTICIPATION LIMITATIONS: meal prep, cleaning, laundry, driving, shopping, community activity, occupation, and yard work  PERSONAL FACTORS: Behavior pattern, Fitness, and 1 comorbidity: Encephalopathy vs Adjustment Disorder are also affecting patient's functional outcome.   REHAB POTENTIAL: Good  CLINICAL DECISION MAKING: Evolving/moderate complexity  EVALUATION COMPLEXITY: Moderate  PLAN:  PT FREQUENCY: 2x/week  PT DURATION: 6 weeks  PLANNED INTERVENTIONS: 97164- PT Re-evaluation, 97750- Physical Performance Testing, 97110-Therapeutic exercises, 97530- Therapeutic activity, [id number]- Neuromuscular re-education, 97535- Self Care, [id number]- Manual therapy, 02883-  Gait training, 02886- Aquatic Therapy, [phone]- Electrical stimulation (manual), [phone] (1-2 muscles), [id number] (3+ muscles)- Dry Needling, Patient/Family education, Balance training, Stair training, Joint mobilization, Spinal mobilization, Vestibular training, and DME instructions  PLAN FOR NEXT  SESSION: Expand HEP for LLE NMR, staggered STS/squats, endurance and confidence w/mobility. SciFit for endurance. STAIRS. Eccentric control, step clearance w/LLE and gait w/reduced reliance on BUEs, L step ups forward and laterally, hurdles, modified SLS  Did she schedule ST evaluation?  Daved KATHEE Bull, PT, DPT 03/09/2024, 4:17 PM

## 2024-03-09 NOTE — Patient Instructions (Signed)
 Access Code: [email or web address] URL: [email or web address] Date: 03/09/2024 Prepared by: Daved Bull  Exercises - Sit to Stand with Armchair  - 1 x daily - 7 x weekly - 2 sets - 10 reps - Standing March with Counter Support  - 1 x daily - 4 x weekly - 2 sets - 10 reps - Standing Quarter Turn with Counter Support  - 1 x daily - 4 x weekly - 1-2 sets - 10 reps - Standing Hip Abduction with Counter Support  - 1 x daily - 4 x weekly - 2 sets - 10 reps

## 2024-03-14 ENCOUNTER — Ambulatory Visit: Admitting: Occupational Therapy

## 2024-03-14 DIAGNOSIS — R29818 Other symptoms and signs involving the nervous system: Secondary | ICD-10-CM

## 2024-03-14 DIAGNOSIS — R278 Other lack of coordination: Secondary | ICD-10-CM | POA: Diagnosis not present

## 2024-03-14 DIAGNOSIS — M6281 Muscle weakness (generalized): Secondary | ICD-10-CM

## 2024-03-14 NOTE — Therapy (Unsigned)
 OUTPATIENT OCCUPATIONAL THERAPY NEURO TREATMENT  Patient Name: ADONNA HORSLEY MRN: [id number] DOB:[birth year], 58 y.o., female Today's Date: 03/14/2024  PCP: Waylan Almarie SAUNDERS, MD  REFERRING PROVIDER: Pegge Toribio PARAS, PA-C  END OF SESSION:  OT End of Session - 03/14/24 1531     Visit Number 3    Number of Visits 13    Date for OT Re-Evaluation 04/14/24    Authorization Type [employer]    OT Start Time 1531    OT Stop Time 1616    OT Time Calculation (min) 45 min    Equipment Utilized During Treatment Flex bar, chain game    Activity Tolerance Patient tolerated treatment well    Behavior During Therapy WFL for tasks assessed/performed         Past Medical History:  Diagnosis Date   Dyspnea    occasional   E-coli UTI 08/2019   Hepatitis 05/2020   Hepatitis B core antibody positive   Hypertension    Migraine    Plantar fibromatosis    PONV (postoperative nausea and vomiting)    Pre-diabetes    Sarcoidosis    TB lung, latent    Thyroid  nodule 01/29/2023   seen on PET scan   Vitamin B12 deficiency 06/2019   Vitamin D  deficiency 06/2019   Past Surgical History:  Procedure Laterality Date   ABDOMINAL HYSTERECTOMY     FINE NEEDLE ASPIRATION  02/19/2023   Procedure: FINE NEEDLE ASPIRATION (FNA) LINEAR;  Surgeon: Brenna Adine CROME, DO;  Location: [facility];  Service: Cardiopulmonary;;   FOOT SURGERY Bilateral 1985   MASS EXCISION Right 05/19/2018   Procedure: EXCISION PALMAR NODULES RIGHT HAND;  Surgeon: Murrell Drivers, MD;  Location:  SUR[facility]ERY [facility];  Service: Orthopedics;  Laterality: Right;   PARTIAL HYSTERECTOMY  1997   VIDEO BRONCHOSCOPY WITH ENDOBRONCHIAL ULTRASOUND Bilateral 02/19/2023   Procedure: VIDEO BRONCHOSCOPY WITH ENDOBRONCHIAL ULTRASOUND;  Surgeon: Brenna Adine CROME, DO;  Location: [facility];  Service: Cardiopulmonary;  Laterality: Bilateral;   Patient Active Problem List   Diagnosis Date Noted   Cognitive and neurobehavioral dysfunction  02/04/2024   Encephalopathy 01/28/2024   Encephalopathy, hypertensive 01/20/2024   Medication monitoring encounter 10/01/2023   TB lung, latent 08/27/2023   Sarcoidosis 04/22/2023   Adenopathy 02/09/2023   History of COVID-19 10/03/2020   Hepatitis B carrier (HCC) 07/03/2020   Mass of left hand 10/02/2019   Elevated blood pressure reading in office with diagnosis of hypertension 09/11/2019   Pre-diabetes 06/14/2019   Low back pain without sciatica 03/07/2019   Back muscle spasm 03/07/2019   History of recent fall 12/31/2018   Sprain of right ankle 11/18/2018   Class 2 severe obesity due to excess calories with serious comorbidity and body mass index (BMI) of 35.0 to 35.9 in adult [facility]) 10/31/2018   Essential hypertension, benign 11/08/2015   Tendon calcification 11/08/2015   Adjustment disorder with mixed anxiety and depressed mood 11/08/2015   Insomnia 11/08/2015    ONSET DATE: 02/04/2024 (Date of referral)  REFERRING DIAG: G93.40 (ICD-10-CM) - Encephalopathy, unspecified   THERAPY DIAG:  Other lack of coordination  Muscle weakness (generalized)  Other symptoms and signs involving the nervous system  Rationale for Evaluation and Treatment: Rehabilitation  SUBJECTIVE:   SUBJECTIVE STATEMENT: Pt reported that she goes back to PM&R in 2 months.   She reports things are going well and no concerns with the exercises and activities issued at last OT session.   Pt accompanied by: self  PERTINENT HISTORY: PMH: HTN,  Sarcoidosis, hepatitis, migraines, LBP, Latent TB (positive QuantiFERON test)   PRECAUTIONS: Fall  WEIGHT BEARING RESTRICTIONS: No  PAIN:  Are you having pain? No  FALLS: Has patient fallen in last 6 months? No  LIVING ENVIRONMENT: Lives with: lives with their spouse Lives in: House/apartment Stairs: Yes: Internal: splint level steps; on right going up and no rail to go downstairs and External: 2 steps; none Has following equipment at home: Vannie - 2  wheeled  PLOF: Independent; driving; phone scheduler with computer work; reading; puzzles (crosswords)  PATIENT GOALS: return to work/PLOF  OBJECTIVE:  Note: Objective measures were completed at Evaluation unless otherwise noted.  HAND DOMINANCE: Left  ADLs: Overall ADLs: mod I (increased time)  Eating: unsure - hasn't tried  IADLs: Shopping: max A Light housekeeping: dependent Meal Prep: mod I for light meal prep;  Community mobility: dependent Medication management: independent Financial management: independent Handwriting: Increased time and 50% of prior level per pt report  MOBILITY STATUS: Independent  ACTIVITY TOLERANCE: Activity tolerance: fair  FUNCTIONAL OUTCOME MEASURES: PSFS: 5.0 total score   UPPER EXTREMITY ROM:    BUE: WNL  UPPER EXTREMITY MMT:     MMT Right (eval) Left (eval)  Shoulder flexion WNL 4/5  Shoulder abduction WNL 4/5  Elbow flexion WNL 4/5  Elbow extension WNL 4/5  (Blank rows = not tested)  HAND FUNCTION: Grip strength: Right: 49.6 lbs; Left: 19.6 lbs  COORDINATION: 9 Hole Peg test: Right: 19 sec; Left: 32 sec  SENSATION: WFL  EDEMA: none reported or observed  MUSCLE TONE: WFL  COGNITION: Overall cognitive status: Impaired and reports more difficulty with memory since hospitalization  VISION: Subjective report: no changes Baseline vision: Wears glasses all the time and progressive lenses Visual history: none  VISION ASSESSMENT: WFL  PERCEPTION: WFL  PRAXIS: Impaired: Motor planning  OBSERVATIONS: Pt ambulates with use of RW. No loss of balance. The pt appears well kept and has glasses donned.                                                                                                                          TODAY'S TREATMENT :    - Therapeutic exercises completed for duration as noted below including:  Pt engaged in resistance bar exercises again with yellow flex bar x 5-10 reps for each motion with  both hands to engaged L hand in stabilizing bar for R hand.  Flex bar used for strength and endurance of affected extremity and included the following: -wrist flex and extension - twisting bar forward and backwards to flex and extend wrist/s -supination/pronation - bending the bar on table top side to side to rotate forearm - palm up and down and flexing bar in half with palms down and palms up -radial/ulnar deviation - pushing the bar back on forth on table top to tip wrist - from pinkie back to thumb  Pt able to perform all motions with cues for stability and hand position.  Pt  encouraged to work on wringing out wash cloth or small hand towel for grip strength and ROM of BUEs.   - Therapeutic activities completed for duration as noted below including:  Pt participated in Chain game using BUE for eye-hand coordination, pinch strength, visual perceptual skills and cognition for planning and problem solving.  Game required pt to turn-take stretching elastics over 4 pegs on game board to create triangles to 'capture' spaces.  Small game pieces are placed  inside the completed triangle/s with the objective to get rid off all your game pieces first. Pt able to problem solve to create triangles multiple time with mod cues to begin and no physical difficulty with coordination of L UE to stretch elastics.  Slight issue with sensory awareness of lightweight game pieces. Pt was able to perform in hand manipulation to manage game pieces with extra time and followed directions with verbal and visual cues to introduce new game/rules. Pt able to play game during social conversation min difficulty.   PATIENT EDUCATION: Education details: Functional coordination activities at home Person educated: Patient Education method: Explanation and Verbal cues Education comprehension: verbalized understanding, verbal cues required, and needs further education  HOME EXERCISE PROGRAM: 03/07/2024: putty and coordination  HEPs  GOALS:  SHORT TERM GOALS: Target date: 03/31/2024  Patient will demonstrate initial L UE HEP with 25% verbal cues or less for proper execution. Baseline: Goal status: IN PROGRESS  2.  Pt will report no more than mild difficulty cutting food utilizing L hand.  Baseline: has not tried Goal status: IN Progress   LONG TERM GOALS: Target date: 04/14/24   Patient will demonstrate updated L UE HEP with visual handouts only for proper execution. Baseline:  Goal status: IN Progress  2.  Patient will demo improved FM coordination as evidenced by completing nine-hole peg with use of L in 25 seconds or less. Baseline: Right: 19 sec; Left: 32 sec Goal status: IN Progress  3.  Patient will demonstrate at least 30 lbs L grip strength as needed to open jars and other containers. Baseline: Right: 49.6 lbs; Left: 19.6 lbs Goal status: IN Progress  4.  Patient will report at least two-point increase in average PSFS score or at least three-point increase in a single activity score indicating functionally significant improvement given minimum detectable change.  Baseline: 5.0 total score (See above for individual activity scores) Goal status: INITIAL  ASSESSMENT:  CLINICAL IMPRESSION: Patient is a 58 y.o. female who was seen today for occupational therapy evaluation for LUE dysfunction s/p encephalopathy. Patient demonstrated good participation in novel FM game with min cues for sequencing and visual perceptual tasks. Pt will benefit from continued skilled OT services in the outpatient setting to work on impairments as noted at evaluation to help pt return to [facility] as able.  Will monitor for need for ST referral to further evaluate and help address cognitive impairments.  PERFORMANCE DEFICITS: in functional skills including ADLs, IADLs, coordination, strength, Fine motor control, endurance, decreased knowledge of use of DME, vision, and UE functional use and cognitive skills including memory.    IMPAIRMENTS: are limiting patient from ADLs, IADLs, work, leisure, and social participation.   CO-MORBIDITIES: may have co-morbidities  that affects occupational performance. Patient will benefit from skilled OT to address above impairments and improve overall function.  REHAB POTENTIAL: Good  PLAN:  OT FREQUENCY: 2x/week  OT DURATION: 6 weeks  PLANNED INTERVENTIONS: 97168 OT Re-evaluation, 97535 self care/ADL training, 02889 therapeutic exercise, 97530 therapeutic activity, 97112 neuromuscular  re-education, 97035 ultrasound, 02981 paraffin, 97039 fluidotherapy, 97010 moist heat, 97034 contrast bath, functional mobility training, coping strategies training, patient/family education, and DME and/or AE instructions  RECOMMENDED OTHER SERVICES: ST referral  CONSULTED AND AGREED WITH PLAN OF CARE: Patient  PLAN FOR NEXT SESSION: review LUE coordination and putty HEPs and golf solitaire; [email or web address]   Clarita LITTIE Pride, OT 03/14/2024, 4:32 PM

## 2024-03-17 ENCOUNTER — Ambulatory Visit: Admitting: Physical Therapy

## 2024-03-17 VITALS — BP 119/79 | HR 56

## 2024-03-17 DIAGNOSIS — R278 Other lack of coordination: Secondary | ICD-10-CM | POA: Diagnosis not present

## 2024-03-17 DIAGNOSIS — M6281 Muscle weakness (generalized): Secondary | ICD-10-CM

## 2024-03-17 DIAGNOSIS — R2689 Other abnormalities of gait and mobility: Secondary | ICD-10-CM

## 2024-03-17 DIAGNOSIS — R2681 Unsteadiness on feet: Secondary | ICD-10-CM

## 2024-03-17 NOTE — Therapy (Signed)
 OUTPATIENT PHYSICAL THERAPY NEURO TREATMENT   Patient Name: Jackie Reed MRN: [id number] DOB:1966-03-13, 58 y.o., female Today's Date: 03/17/2024   PCP: Waylan Almarie SAUNDERS, MD REFERRING PROVIDER: Pegge Toribio PARAS, PA-C  END OF SESSION:  PT End of Session - 03/17/24 1533     Visit Number 3    Number of Visits 13   Plus eval   Date for PT Re-Evaluation 04/20/24    Authorization Type [employer]    PT Start Time 1532    PT Stop Time 1613    PT Time Calculation (min) 41 min    Equipment Utilized During Treatment Gait belt    Activity Tolerance Patient tolerated treatment well    Behavior During Therapy WFL for tasks assessed/performed           Past Medical History:  Diagnosis Date   Dyspnea    occasional   E-coli UTI 08/2019   Hepatitis 05/2020   Hepatitis B core antibody positive   Hypertension    Migraine    Plantar fibromatosis    PONV (postoperative nausea and vomiting)    Pre-diabetes    Sarcoidosis    TB lung, latent    Thyroid  nodule 01/29/2023   seen on PET scan   Vitamin B12 deficiency 06/2019   Vitamin D  deficiency 06/2019   Past Surgical History:  Procedure Laterality Date   ABDOMINAL HYSTERECTOMY     FINE NEEDLE ASPIRATION  02/19/2023   Procedure: FINE NEEDLE ASPIRATION (FNA) LINEAR;  Surgeon: Brenna Adine CROME, DO;  Location: [facility];  Service: Cardiopulmonary;;   FOOT SURGERY Bilateral 1985   MASS EXCISION Right 05/19/2018   Procedure: EXCISION PALMAR NODULES RIGHT HAND;  Surgeon: Murrell Drivers, MD;  Location: [facility];  Service: Orthopedics;  Laterality: Right;   PARTIAL HYSTERECTOMY  1997   VIDEO BRONCHOSCOPY WITH ENDOBRONCHIAL ULTRASOUND Bilateral 02/19/2023   Procedure: VIDEO BRONCHOSCOPY WITH ENDOBRONCHIAL ULTRASOUND;  Surgeon: Brenna Adine CROME, DO;  Location: [facility];  Service: Cardiopulmonary;  Laterality: Bilateral;   Patient Active Problem List   Diagnosis Date Noted   Cognitive and neurobehavioral  dysfunction 02/04/2024   Encephalopathy 01/28/2024   Encephalopathy, hypertensive 01/20/2024   Medication monitoring encounter 10/01/2023   TB lung, latent 08/27/2023   Sarcoidosis 04/22/2023   Adenopathy 02/09/2023   History of COVID-19 10/03/2020   Hepatitis B carrier (HCC) 07/03/2020   Mass of left hand 10/02/2019   Elevated blood pressure reading in office with diagnosis of hypertension 09/11/2019   Pre-diabetes 06/14/2019   Low back pain without sciatica 03/07/2019   Back muscle spasm 03/07/2019   History of recent fall 12/31/2018   Sprain of right ankle 11/18/2018   Class 2 severe obesity due to excess calories with serious comorbidity and body mass index (BMI) of 35.0 to 35.9 in adult [facility]) 10/31/2018   Essential hypertension, benign 11/08/2015   Tendon calcification 11/08/2015   Adjustment disorder with mixed anxiety and depressed mood 11/08/2015   Insomnia 11/08/2015    ONSET DATE: 02/03/2024 (referral)   REFERRING DIAG: G93.40 (ICD-10-CM) - Encephalopathy, unspecified  THERAPY DIAG:  Muscle weakness (generalized)  Other abnormalities of gait and mobility  Unsteadiness on feet  Rationale for Evaluation and Treatment: Rehabilitation  SUBJECTIVE:  SUBJECTIVE STATEMENT: Pt presents w/RW. Denies falls or acute changes. Reports her HEP is going well and she is feeling better w/stairs. Wants to work on the bike and her hip exercises today.    Pt accompanied by: self - husband Darin (waiting in lobby)  PERTINENT HISTORY: HTN, Sarcoidosis, hepatitis, migraines, LBP, Latent TB (positive QuantiFERON test)  PAIN:  Are you having pain? No  PRECAUTIONS: Fall  RED FLAGS: None   WEIGHT BEARING RESTRICTIONS: No  FALLS: Has patient fallen in last 6 months? No  LIVING  ENVIRONMENT: Lives with: lives with their spouse Lives in: House/apartment Stairs: Yes: Internal: 2 flights steps; one set has a rail on the right side going up, other flight has no rail and External: 2 steps; none Has following equipment at home: Walker - 2 wheeled and Grab bars  PLOF: Independent  PATIENT GOALS: To get the strength back on my L side so I can get back to being independent   OBJECTIVE:  Note: Objective measures were completed at Evaluation unless otherwise noted.  DIAGNOSTIC FINDINGS:  CT of head from 01/20/24 IMPRESSION: 1. No acute intracranial abnormality. 2. Generalized atrophy and findings of chronic microvascular disease.  MRI of brain from 01/20/24 IMPRESSION: 1. No acute intracranial abnormality. 2. Findings of chronic small vessel ischemia and volume loss.  MRI of C-Spine from 01/22/24 IMPRESSION: 1. Small central to left paracentral disc protrusions at C2-3 through C5-6 as above. Secondary minor cord flattening without cord signal changes or significant spinal stenosis. Findings could contribute to left upper extremity symptoms. 2. Small right paracentral disc protrusion at C6-7 without significant stenosis.   COGNITION: Overall cognitive status: Difficulty to assess due to: no family present   SENSATION: Pt denies numbness/tingling in all extremities    POSTURE: rounded shoulders, increased thoracic kyphosis, and weight shift left  LOWER EXTREMITY ROM:     Active  Right Eval Left Eval  Hip flexion    Hip extension    Hip abduction    Hip adduction    Hip internal rotation    Hip external rotation    Knee flexion    Knee extension    Ankle dorsiflexion    Ankle plantarflexion    Ankle inversion    Ankle eversion     (Blank rows = not tested)  LOWER EXTREMITY MMT:  Tested in seated position - inconsistent muscle contraction on LLE  MMT Right Eval Left Eval  Hip flexion 5 4+  Hip extension    Hip abduction 5 4-  Hip  adduction 5 4-  Hip internal rotation    Hip external rotation    Knee flexion 5 4+  Knee extension 5 4  Ankle dorsiflexion 5 4  Ankle plantarflexion    Ankle inversion    Ankle eversion    (Blank rows = not tested)  BED MOBILITY: Pt reports independence with this, sleeping in standard bed height   TRANSFERS: Sit to stand: Modified independence  Assistive device utilized: Environmental consultant - 2 wheeled     Stand to sit: Modified independence  Assistive device utilized: Environmental consultant - 2 wheeled      RAMP:  Not tested  CURB:  Not tested  STAIRS: Findings: Level of Assistance: SBA, Stair Negotiation Technique: Step to Pattern with Bilateral Rails, Number of Stairs: 4, Height of Stairs: 6   , and Comments: Pt required min cues to remember proper BLE sequence to descent. No LOB noted, good eccentric control w/descent  GAIT: Gait pattern: step through pattern,  decreased step length- Left, decreased stride length, decreased hip/knee flexion- Left, decreased ankle dorsiflexion- Left, lateral hip instability, wide BOS, and poor foot clearance- Left Distance walked: Various clinic distances  Assistive device utilized: Walker - 2 wheeled Level of assistance: Modified independence Comments: Pt ambulates slowly w/trend towards step-to pattern bilaterally. Pt w/increased stance time on LLE > RLE resulting in decreased step length/clearance of LLE.    FUNCTIONAL TESTS:      PATIENT SURVEYS:  ABC scale: The Activities-Specific Balance Confidence (ABC) Scale 0% 10 20 30  40 50 60 70 80 90 100% No confidence<->completely confident  "How confident are you that you will not lose your balance or become unsteady when you . . .   Date tested 03/02/24  Walk around the house 20%  2. Walk up or down stairs 10%  3. Bend over and pick up a slipper from in front of a closet floor 10%  4. Reach for a small can off a shelf at eye level 50%  5. Stand on tip toes and reach for something above your head 10%  6. Stand  on a chair and reach for something 0%  7. Sweep the floor 10%  8. Walk outside the house to a car parked in the driveway 50%  9. Get into or out of a car 60%  10. Walk across a parking lot to the mall 10%  11. Walk up or down a ramp 20%  12. Walk in a crowded mall where people rapidly walk past you 10%  13. Are bumped into by people as you walk through the mall 50%  14. Step onto or off of an escalator while you are holding onto the railing 20%  15. Step onto or off an escalator while holding onto parcels such that you cannot hold onto the railing 10%  16. Walk outside on icy sidewalks 10%  Total: #/16 350/16 = 21.875%    VITALS  Vitals:   03/17/24 1535  BP: 119/79  Pulse: (!) 56                                                                                                                          TREATMENT:   Self-care/home management  Assessed vitals (see above) and WNL   Ther Act/NMR  SciFit multi-peaks level 6.5 for 8 minutes using BUE/BLEs for neural priming for reciprocal movement, dynamic cardiovascular warmup and increased amplitude of stepping. RPE of 1/10 following activity.  In // bars for improved single leg stability, functional hip strength and gait w/reduced reliance on UE support:  Per pt request, standing hip abduction, 3x5 reps per side w/BUE support. Pt able to self-cue upright posture this date  Standing quarter turns over 4 beams, x5 reps per side. Increased difficulty lifting LLE and mild ataxia in LLE noted  Fwd/retro gait in bars w/no UE support, x3 reps each direction w/SBA. Increased difficulty w/fwd direction w/improved step clearance of LLE noted in retro direction  Lateral  stepping without UE support, x3 reps each direction mod I. Increased difficulty stepping to R side > L side. RPE of 2/10 following activity   Gait pattern: step through pattern, decreased arm swing- Left, decreased step length- Right, decreased step length- Left, decreased stride  length, decreased hip/knee flexion- Left, lateral hip instability, and wide BOS Distance walked: 150' Assistive device utilized: Counselling psychologist Level of assistance: SBA Comments: Practiced use of SBQC in RUE around gym (turning to R) and pt performed well w/no LOB noted. Pt w/equal step clearance of LLE and RLE throughout. Min cues initially to follow cane, step, step sequence, which pt performed well. Cued pt to facilitate movement of cane and LLE simultaneously as she got comfortable, but pt not ready for this today.    PATIENT EDUCATION: Education details: Continue HEP, where to purchase cane and clearance to use it at home   Person educated: Patient Education method: Medical illustrator Education comprehension: verbalized understanding and needs further education  HOME EXERCISE PROGRAM: Access Code: [email or web address] URL: [email or web address] Date: 03/09/2024 Prepared by: Daved Bull  Exercises - Sit to Stand with Armchair  - 1 x daily - 7 x weekly - 2 sets - 10 reps - Standing March with Counter Support  - 1 x daily - 4 x weekly - 2 sets - 10 reps - Standing Quarter Turn with Counter Support  - 1 x daily - 4 x weekly - 1-2 sets - 10 reps - Standing Hip Abduction with Counter Support  - 1 x daily - 4 x weekly - 2 sets - 10 reps  GOALS: Goals reviewed with patient? Yes  SHORT TERM GOALS: Target date: 03/30/2024    Pt will improve score on ABC scale to >/= 35% for improved confidence in mobility and reduced fall risk  Baseline: 21.875% (7/10) Goal status: INITIAL  2.  Pt will ascend/descend 16 steps w/single rail on R w/step-through pattern and SBA for improved functional strength and safety in home environment  Baseline: SBA w/bilateral rails  Goal status: INITIAL  3.  Pt will improve gait velocity to at least 0.5 m/s w/LRAD for improved gait efficiency and reduced fall risk   Baseline: 0.41 m/s w/RW  Goal status: INITIAL  4.  Pt will  improve 5 x STS to less than or equal to 22 seconds w/no UE support to demonstrate improved functional strength and transfer efficiency.   Baseline: 24.16s w/BUE support  Goal status: INITIAL   LONG TERM GOALS: Target date: 04/13/2024   Pt will ascend/descend 16 steps w/no handrails w/SBA for improved BLE strength and safety in home environment.  Baseline:  Goal status: INITIAL  2.  Pt will improve 5 x STS to less than or equal to 18 seconds w/no UE support to demonstrate improved functional strength and transfer efficiency.   Baseline: 24.16s w/BUE support  Goal status: INITIAL  3.  Pt will improve score on ABC Scale to >/= 50% for improved functional mobility and reduced fall risk  Baseline: 21.875% (7/10) Goal status: INITIAL  4.  Pt will improve gait velocity to at least 0.6 m/s w/LRAD for improved gait efficiency and reduced fall risk   Baseline: 0.41 m/s w/RW Goal status: INITIAL   ASSESSMENT:  CLINICAL IMPRESSION: Emphasis of skilled PT session today on gait training w/reduced reliance on BUE support, single leg stability and increased step clearance of LLE. Pt requesting to ride SciFit and work on stepping tasks from last session, so progressed resistance and increased step  clearance requirement for added challenge. Pt reported readiness to try cane today and ambulated 150' around gym w/SBQC w/SBA only. Pt encouraged to purchase cane and may use it at home but will still benefit from use of RW in community. Continue per POC.  OBJECTIVE IMPAIRMENTS: Abnormal gait, decreased activity tolerance, decreased balance, decreased knowledge of condition, decreased mobility, difficulty walking, decreased strength, decreased safety awareness, impaired perceived functional ability, and improper body mechanics.   ACTIVITY LIMITATIONS: carrying, lifting, squatting, stairs, and locomotion level  PARTICIPATION LIMITATIONS: meal prep, cleaning, laundry, driving, shopping, community  activity, occupation, and yard work  PERSONAL FACTORS: Behavior pattern, Fitness, and 1 comorbidity: Encephalopathy vs Adjustment Disorder are also affecting patient's functional outcome.   REHAB POTENTIAL: Good  CLINICAL DECISION MAKING: Evolving/moderate complexity  EVALUATION COMPLEXITY: Moderate  PLAN:  PT FREQUENCY: 2x/week  PT DURATION: 6 weeks  PLANNED INTERVENTIONS: 97164- PT Re-evaluation, 97750- Physical Performance Testing, 97110-Therapeutic exercises, 97530- Therapeutic activity, [id number]- Neuromuscular re-education, 97535- Self Care, [id number]- Manual therapy, [id number]- Gait training, [phone]- Aquatic Therapy, [phone]- Electrical stimulation (manual), [phone] (1-2 muscles), [id number] (3+ muscles)- Dry Needling, Patient/Family education, Balance training, Stair training, Joint mobilization, Spinal mobilization, Vestibular training, and DME instructions  PLAN FOR NEXT SESSION: Expand HEP for LLE NMR, staggered STS/squats, endurance and confidence w/mobility. SciFit for endurance. STAIRS. Eccentric control, step clearance w/LLE and gait w/reduced reliance on BUEs, L step ups forward and laterally, hurdles, modified SLS, did she get cane?    Mishika Flippen E Jovanni Eckhart, PT, DPT 03/17/2024, 4:18 PM

## 2024-03-20 ENCOUNTER — Ambulatory Visit (INDEPENDENT_AMBULATORY_CARE_PROVIDER_SITE_OTHER): Admitting: Family Medicine

## 2024-03-20 ENCOUNTER — Encounter: Payer: Self-pay | Admitting: Family Medicine

## 2024-03-20 VITALS — BP 110/86 | HR 77 | Temp 98.1°F | Ht 64.0 in | Wt 211.8 lb

## 2024-03-20 DIAGNOSIS — I10 Essential (primary) hypertension: Secondary | ICD-10-CM

## 2024-03-20 DIAGNOSIS — E785 Hyperlipidemia, unspecified: Secondary | ICD-10-CM | POA: Insufficient documentation

## 2024-03-20 MED ORDER — ROSUVASTATIN CALCIUM 20 MG PO TABS: 20.0000 mg | ORAL_TABLET | Freq: Every day | ORAL | 1 refills | Status: DC

## 2024-03-20 MED ORDER — HYDROCHLOROTHIAZIDE 25 MG PO TABS: 25.0000 mg | ORAL_TABLET | Freq: Every day | ORAL | 1 refills | Status: DC

## 2024-03-20 MED ORDER — AMLODIPINE BESYLATE 5 MG PO TABS: 5.0000 mg | ORAL_TABLET | Freq: Every day | ORAL | 1 refills | Status: DC

## 2024-03-20 NOTE — Patient Instructions (Signed)
-  It was nice to meet you and look forward to taking care of you. -Continue all medications. Refilled Rosuvastatin , Amlodipine , and Hydrochlorothiazide .  -Recommend to have colonoscopy and mammogram completed.  -Follow up in 3 months and please be fasting for labs.

## 2024-03-20 NOTE — Assessment & Plan Note (Signed)
 Stable. Continue Amlodipine  5mg  daily, and HCTZ 25mg  daily. Kidney and potassium function were stable in June. Refilled medication.

## 2024-03-20 NOTE — Assessment & Plan Note (Signed)
 Stable. Continue Rosuvastatin  20mg  daily. Refilled medication.

## 2024-03-20 NOTE — Progress Notes (Signed)
 New Patient Office Visit  Subjective   Patient ID: Jackie Reed, female    DOB: Apr 08, 1966  Age: 58 y.o. MRN: [id number]  CC:  Chief Complaint  Patient presents with   Establish Care    HPI Jackie Reed presents to establish care with new provider.   Patients previous primary care provider: Dr. Almarie Scala MD. Last seen early 2024 for just one visit. Prior to that was under the care of [facility].   Specialist: [facility] with Dr. Sven Bar  [facility] Infectious Disease Dr. Loney Stank (last appointment tomorrow) [facility] (Has appt on 08/01) [facility] with Dr. Kassie Finn Neurology with Dr. Modena Callander  [facility] with Merlynn Mt, NP  [facility]-Dr. Norleen Kiang   HTN: Chronic. Patient is prescribed Amlodipine  5mg  daily, and HCTZ 25mg  daily. She monitors her blood pressure at home, ranges between 100-120/70-80. Denies CP, HA, SHOB, dizziness, lightheadedness, or lower extremity edema. She previously has had to take potassium 20mEq daily for hypokalemia in the past.  BP Readings from Last 3 Encounters:  03/20/24 110/86  03/17/24 119/79  03/06/24 113/76    Hyperlipidemia: Chronic. Patient is prescribed Rosuvastatin  20mg  daily. She has been out of medication for about 1 week. Denies any muscle and joint pain from medication.  Lab Results  Component Value Date   CHOL 155 01/20/2024   HDL 43 01/20/2024   LDLCALC 84 01/20/2024   TRIG 139 01/20/2024   CHOLHDL 3.6 01/20/2024    All other medications are managed by specialist.  Outpatient Encounter Medications as of 03/20/2024  Medication Sig   acetaminophen  (TYLENOL ) 325 MG tablet Take 1-2 tablets (325-650 mg total) by mouth every 4 (four) hours as needed for mild pain (pain score 1-3).   aspirin  EC 81 MG tablet Take 1 tablet (81 mg total) by mouth daily. Swallow whole.   baclofen  (LIORESAL ) 10 MG tablet Take  1 tablet (10 mg total) by mouth 3 (three) times daily as needed for muscle spasms.   budesonide -formoterol  (SYMBICORT ) 80-4.5 MCG/ACT inhaler Inhale 1 puff into the lungs daily.   diclofenac  Sodium (VOLTAREN ) 1 % GEL Apply 2 g topically 4 (four) times daily.   senna-docusate (SENOKOT-S) 8.6-50 MG tablet Take 2 tablets by mouth 2 (two) times daily.   [DISCONTINUED] amLODipine  (NORVASC ) 5 MG tablet Take 1 tablet (5 mg total) by mouth daily.   [DISCONTINUED] hydrochlorothiazide  (HYDRODIURIL ) 25 MG tablet Take 1 tablet (25 mg total) by mouth daily.   [DISCONTINUED] oxyCODONE  (OXY IR/ROXICODONE ) 5 MG immediate release tablet Take 1 tablet (5 mg total) by mouth every 6 (six) hours as needed for severe pain (pain score 7-10).   [DISCONTINUED] potassium chloride  SA (KLOR-CON  M) 20 MEQ tablet Take 1 tablet (20 mEq total) by mouth daily.   [DISCONTINUED] rosuvastatin  (CRESTOR ) 20 MG tablet Take 1 tablet (20 mg total) by mouth daily.   amLODipine  (NORVASC ) 5 MG tablet Take 1 tablet (5 mg total) by mouth daily.   hydrochlorothiazide  (HYDRODIURIL ) 25 MG tablet Take 1 tablet (25 mg total) by mouth daily.   rosuvastatin  (CRESTOR ) 20 MG tablet Take 1 tablet (20 mg total) by mouth daily.   [DISCONTINUED] alum & mag hydroxide-simeth (MAALOX/MYLANTA) 200-200-20 MG/5ML suspension Take 30 mLs by mouth every 4 (four) hours as needed for indigestion.   [DISCONTINUED] isoniazid  (NYDRAZID ) 300 MG tablet Take 1 tablet (300 mg total) by mouth in the morning. (Patient not taking: Reported on  03/20/2024)   [DISCONTINUED] polyethylene glycol powder (GLYCOLAX /MIRALAX ) 17 GM/SCOOP powder Take 17 g by mouth 2 (two) times daily.   [DISCONTINUED] pyridOXINE  (B-6) 50 MG tablet Take 1 tablet (50 mg total) by mouth daily.   No facility-administered encounter medications on file as of 03/20/2024.    Past Medical History:  Diagnosis Date   Adenomatous colon polyp 2017   Dyspnea    occasional   E-coli UTI 08/2019   Hepatitis 05/2020    Hepatitis B core antibody positive   Hypertension    Migraine    Plantar fibromatosis    PONV (postoperative nausea and vomiting)    Pre-diabetes    Sarcoidosis    Stroke (HCC)    TB lung, latent    Thyroid  nodule 01/29/2023   seen on PET scan   Vitamin B12 deficiency 06/2019   Vitamin D  deficiency 06/2019    Past Surgical History:  Procedure Laterality Date   ABDOMINAL HYSTERECTOMY     FINE NEEDLE ASPIRATION  02/19/2023   Procedure: FINE NEEDLE ASPIRATION (FNA) LINEAR;  Surgeon: Brenna Adine CROME, DO;  Location: [facility];  Service: Cardiopulmonary;;   FOOT SURGERY Bilateral 1985   MASS EXCISION Right 05/19/2018   Procedure: EXCISION PALMAR NODULES RIGHT HAND;  Surgeon: Murrell Drivers, MD;  Location: [facility];  Service: Orthopedics;  Laterality: Right;   PARTIAL HYSTERECTOMY  1997   VIDEO BRONCHOSCOPY WITH ENDOBRONCHIAL ULTRASOUND Bilateral 02/19/2023   Procedure: VIDEO BRONCHOSCOPY WITH ENDOBRONCHIAL ULTRASOUND;  Surgeon: Brenna Adine CROME, DO;  Location: [facility];  Service: Cardiopulmonary;  Laterality: Bilateral;    Family History  Problem Relation Age of Onset   Heart failure Mother    Diabetes Mother    Heart failure Father    Hypertension Father    Hyperlipidemia Father    Colon cancer Neg Hx    Colon polyps Neg Hx    Breast cancer Neg Hx    Crohn's disease Neg Hx    Esophageal cancer Neg Hx    Rectal cancer Neg Hx    Stomach cancer Neg Hx    Ulcerative colitis Neg Hx     Social History   Socioeconomic History   Marital status: Married    Spouse name: Not on file   Number of children: 2   Years of education: Not on file   Highest education level: High school graduate  Occupational History   Not on file  Tobacco Use   Smoking status: Never   Smokeless tobacco: Never  Vaping Use   Vaping status: Never Used  Substance and Sexual Activity   Alcohol use: No    Alcohol/week: 0.0 standard drinks of alcohol   Drug use: No   Sexual  activity: Yes    Birth control/protection: Surgical    Comment: Hysterectomy  Other Topics Concern   Not on file  Social History Narrative   Not on file   Social Drivers of Health   Financial Resource Strain: Low Risk  (09/28/2023)   Received from [facility]   Overall Financial Resource Strain (CARDIA)    Difficulty of Paying Living Expenses: Not very hard  Food Insecurity: No Food Insecurity (01/19/2024)   Hunger Vital Sign    Worried About Running Out of Food in the Last Year: Never true    Ran Out of Food in the Last Year: Never true  Transportation Needs: No Transportation Needs (01/19/2024)   PRAPARE - Transportation    Lack of Transportation (Medical): No    Lack  of Transportation (Non-Medical): No  Physical Activity: Insufficiently Active (03/20/2024)   Exercise Vital Sign    Days of Exercise per Week: 2 days    Minutes of Exercise per Session: 40 min  Stress: No Stress Concern Present (03/20/2024)   [employer] of Occupational Health - Occupational Stress Questionnaire    Feeling of Stress: Not at all  Social Connections: Unknown (12/22/2021)   [facility] from [facility]   Social Network    Social Network: Not on file  Intimate Partner Violence: Not At Risk (01/19/2024)   Humiliation, Afraid, Rape, and Kick questionnaire    Fear of Current or Ex-Partner: No    Emotionally Abused: No    Physically Abused: No    Sexually Abused: No    ROS See HPI above    Objective  BP 110/86 (BP Location: Left Arm, Patient Position: Sitting, Cuff Size: Large)   Pulse 77   Temp 98.1 F (36.7 C) (Oral)   Ht 5' 4 (1.626 m)   Wt 211 lb 12.8 oz (96.1 kg)   SpO2 96%   BMI 36.36 kg/m   Physical Exam Vitals reviewed.  Constitutional:      General: She is not in acute distress.    Appearance: Normal appearance. She is obese. She is not ill-appearing, toxic-appearing or diaphoretic.  HENT:     Head: Normocephalic and atraumatic.  Eyes:     General:        Right eye: No  discharge.        Left eye: No discharge.     Conjunctiva/sclera: Conjunctivae normal.  Cardiovascular:     Rate and Rhythm: Normal rate and regular rhythm.     Heart sounds: Normal heart sounds. No murmur heard.    No friction rub. No gallop.  Pulmonary:     Effort: Pulmonary effort is normal. No respiratory distress.     Breath sounds: Normal breath sounds.  Musculoskeletal:        General: Normal range of motion.  Skin:    General: Skin is warm and dry.  Neurological:     General: No focal deficit present.     Mental Status: She is alert and oriented to person, place, and time. Mental status is at baseline.     Gait: Gait abnormal Frieda).  Psychiatric:        Mood and Affect: Mood normal.        Behavior: Behavior normal.        Thought Content: Thought content normal.        Judgment: Judgment normal.      Assessment & Plan:  Hyperlipidemia, unspecified hyperlipidemia type Assessment & Plan: Stable. Continue Rosuvastatin  20mg  daily. Refilled medication.   Orders: -     Rosuvastatin  Calcium ; Take 1 tablet (20 mg total) by mouth daily.  Dispense: 90 tablet; Refill: 1  Essential hypertension, benign Assessment & Plan: Stable. Continue Amlodipine  5mg  daily, and HCTZ 25mg  daily. Kidney and potassium function were stable in June. Refilled medication.   Orders: -     amLODIPine  Besylate; Take 1 tablet (5 mg total) by mouth daily.  Dispense: 90 tablet; Refill: 1 -     hydroCHLOROthiazide ; Take 1 tablet (25 mg total) by mouth daily.  Dispense: 90 tablet; Refill: 1  1.Review health maintenance:  -Cervical cancer screening: Partial hysterectomy; no cervix  -Colonoscopy: Needs to make appt. Last colonoscopy 09/14/2022 poor-reschedule  -Covid booster: Declines  -Tdap vaccine: Unknown, recommend  -Mammogram: [facility]- will completed in a  few weeks  -Zoster vaccine: Unknown  Return in about 3 months (around 06/20/2024) for chronic management.   Tyreck Bell, NP

## 2024-03-21 ENCOUNTER — Encounter: Payer: Self-pay | Admitting: Internal Medicine

## 2024-03-21 ENCOUNTER — Ambulatory Visit: Admitting: Internal Medicine

## 2024-03-21 ENCOUNTER — Other Ambulatory Visit: Payer: Self-pay

## 2024-03-21 VITALS — BP 137/84 | HR 79 | Temp 97.3°F | Ht 64.0 in | Wt 215.0 lb

## 2024-03-21 DIAGNOSIS — Z227 Latent tuberculosis: Secondary | ICD-10-CM | POA: Diagnosis not present

## 2024-03-21 LAB — COMPREHENSIVE METABOLIC PANEL WITH GFR
AG Ratio: 1.1 (calc) (ref 1.0–2.5)
ALT: 14 U/L (ref 6–29)
AST: 27 U/L (ref 10–35)
Albumin: 4 g/dL (ref 3.6–5.1)
Alkaline phosphatase (APISO): 148 U/L (ref 37–153)
BUN: 12 mg/dL (ref 7–25)
CO2: 26 mmol/L (ref 20–32)
Calcium: 9.8 mg/dL (ref 8.6–10.4)
Chloride: 103 mmol/L (ref 98–110)
Creat: 0.77 mg/dL (ref 0.50–1.03)
Globulin: 3.6 g/dL (ref 1.9–3.7)
Glucose, Bld: 114 mg/dL — ABNORMAL HIGH (ref 65–99)
Potassium: 3.4 mmol/L — ABNORMAL LOW (ref 3.5–5.3)
Sodium: 139 mmol/L (ref 135–146)
Total Bilirubin: 0.4 mg/dL (ref 0.2–1.2)
Total Protein: 7.6 g/dL (ref 6.1–8.1)
eGFR: 89 mL/min/1.73m2 (ref >=60)

## 2024-03-21 LAB — CBC WITH DIFFERENTIAL/PLATELET
Absolute Lymphocytes: 1340 {cells}/uL (ref 850–3900)
Absolute Monocytes: 752 {cells}/uL (ref 200–950)
Basophils Absolute: 19 {cells}/uL (ref 0–200)
Basophils Relative: 0.4 %
Eosinophils Absolute: 240 {cells}/uL (ref 15–500)
Eosinophils Relative: 5.1 %
HCT: 38.4 % (ref 35.0–45.0)
Hemoglobin: 11.5 g/dL — ABNORMAL LOW (ref 11.7–15.5)
MCH: 22.2 pg — ABNORMAL LOW (ref 27.0–33.0)
MCHC: 29.9 g/dL — ABNORMAL LOW (ref 32.0–36.0)
MCV: 74.3 fL — ABNORMAL LOW (ref 80.0–100.0)
MPV: 12.2 fL (ref 7.5–12.5)
Monocytes Relative: 16 %
Neutro Abs: 2350 {cells}/uL (ref 1500–7800)
Neutrophils Relative %: 50 %
Platelets: 349 Thousand/uL (ref 140–400)
RBC: 5.17 Million/uL — ABNORMAL HIGH (ref 3.80–5.10)
RDW: 14.9 % (ref 11.0–15.0)
Total Lymphocyte: 28.5 %
WBC: 4.7 Thousand/uL (ref 3.8–10.8)

## 2024-03-21 NOTE — Progress Notes (Signed)
 Patient: Jackie Reed  DOB: 30-Oct-1965 MRN: [id number] PCP: Billy Philippe SAUNDERS, NP    Patient Active Problem List   Diagnosis Date Noted   Hyperlipidemia 03/20/2024   Cognitive and neurobehavioral dysfunction 02/04/2024   Encephalopathy 01/28/2024   Encephalopathy, hypertensive 01/20/2024   Medication monitoring encounter 10/01/2023   TB lung, latent 08/27/2023   Sarcoidosis 04/22/2023   Adenopathy 02/09/2023   History of COVID-19 10/03/2020   Hepatitis B carrier (HCC) 07/03/2020   Mass of left hand 10/02/2019   Elevated blood pressure reading in office with diagnosis of hypertension 09/11/2019   Pre-diabetes 06/14/2019   Low back pain without sciatica 03/07/2019   Back muscle spasm 03/07/2019   History of recent fall 12/31/2018   Sprain of right ankle 11/18/2018   Class 2 severe obesity due to excess calories with serious comorbidity and body mass index (BMI) of 35.0 to 35.9 in adult [facility]) 10/31/2018   Essential hypertension, benign 11/08/2015   Tendon calcification 11/08/2015   Adjustment disorder with mixed anxiety and depressed mood 11/08/2015   Insomnia 11/08/2015     Subjective:  Jackie Reed is a 58 y.o. female with past medical history including pulmonary sarcoidosis as below presents for follow-up of management of latent TB.  Patient is on isoniazid  last seen on 10/01/2023.  Patient states she started the medications around January.  Taking B6 as well. - Please see HPI for from 10/01/2023 for further details. Prsents for end of treatment.   Review of Systems  All other systems reviewed and are negative.   Past Medical History:  Diagnosis Date   Adenomatous colon polyp 2017   Dyspnea    occasional   E-coli UTI 08/2019   Hepatitis 05/2020   Hepatitis B core antibody positive   Hypertension    Migraine    Plantar fibromatosis    PONV (postoperative nausea and vomiting)    Pre-diabetes    Sarcoidosis    Stroke (HCC)    TB lung, latent    Thyroid   nodule 01/29/2023   seen on PET scan   Vitamin B12 deficiency 06/2019   Vitamin D  deficiency 06/2019    Outpatient Medications Prior to Visit  Medication Sig Dispense Refill   acetaminophen  (TYLENOL ) 325 MG tablet Take 1-2 tablets (325-650 mg total) by mouth every 4 (four) hours as needed for mild pain (pain score 1-3). 30 tablet 0   amLODipine  (NORVASC ) 5 MG tablet Take 1 tablet (5 mg total) by mouth daily. 90 tablet 1   aspirin  EC 81 MG tablet Take 1 tablet (81 mg total) by mouth daily. Swallow whole. 90 tablet 3   baclofen  (LIORESAL ) 10 MG tablet Take 1 tablet (10 mg total) by mouth 3 (three) times daily as needed for muscle spasms. 30 each 0   budesonide -formoterol  (SYMBICORT ) 80-4.5 MCG/ACT inhaler Inhale 1 puff into the lungs daily. 10.2 g 0   diclofenac  Sodium (VOLTAREN ) 1 % GEL Apply 2 g topically 4 (four) times daily. 100 g 0   hydrochlorothiazide  (HYDRODIURIL ) 25 MG tablet Take 1 tablet (25 mg total) by mouth daily. 90 tablet 1   rosuvastatin  (CRESTOR ) 20 MG tablet Take 1 tablet (20 mg total) by mouth daily. 90 tablet 1   senna-docusate (SENOKOT-S) 8.6-50 MG tablet Take 2 tablets by mouth 2 (two) times daily. 120 tablet 0   No facility-administered medications prior to visit.     Allergies  Allergen Reactions   Amoxil  [Amoxicillin ] Shortness Of Breath and Palpitations   Morphine   And Codeine Hives and Rash   Ultram  [Tramadol ] Nausea And Vomiting    Social History   Tobacco Use   Smoking status: Never   Smokeless tobacco: Never  Vaping Use   Vaping status: Never Used  Substance Use Topics   Alcohol use: No    Alcohol/week: 0.0 standard drinks of alcohol   Drug use: No    Family History  Problem Relation Age of Onset   Heart failure Mother    Diabetes Mother    Heart failure Father    Hypertension Father    Hyperlipidemia Father    Colon cancer Neg Hx    Colon polyps Neg Hx    Breast cancer Neg Hx    Crohn's disease Neg Hx    Esophageal cancer Neg Hx     Rectal cancer Neg Hx    Stomach cancer Neg Hx    Ulcerative colitis Neg Hx     Objective:  There were no vitals filed for this visit. There is no height or weight on file to calculate BMI.  Physical Exam Constitutional:      Appearance: Normal appearance.  HENT:     Head: Normocephalic and atraumatic.     Right Ear: Tympanic membrane normal.     Left Ear: Tympanic membrane normal.     Nose: Nose normal.     Mouth/Throat:     Mouth: Mucous membranes are moist.  Eyes:     Extraocular Movements: Extraocular movements intact.     Conjunctiva/sclera: Conjunctivae normal.     Pupils: Pupils are equal, round, and reactive to light.  Cardiovascular:     Rate and Rhythm: Normal rate and regular rhythm.     Heart sounds: No murmur heard.    No friction rub. No gallop.  Pulmonary:     Effort: Pulmonary effort is normal.     Breath sounds: Normal breath sounds.  Abdominal:     General: Abdomen is flat.     Palpations: Abdomen is soft.  Musculoskeletal:        General: Normal range of motion.  Skin:    General: Skin is warm and dry.  Neurological:     General: No focal deficit present.     Mental Status: She is alert and oriented to person, place, and time.  Psychiatric:        Mood and Affect: Mood normal.     Lab Results: Lab Results  Component Value Date   WBC 6.2 01/31/2024   HGB 11.9 (L) 01/31/2024   HCT 38.4 01/31/2024   MCV 71.9 (L) 01/31/2024   PLT 412 (H) 01/31/2024    Lab Results  Component Value Date   CREATININE 0.93 02/07/2024   BUN 24 (H) 02/07/2024   NA 138 02/07/2024   K 3.8 02/07/2024   CL 103 02/07/2024   CO2 25 02/07/2024    Lab Results  Component Value Date   ALT 21 01/31/2024   AST 37 01/31/2024   ALKPHOS 147 (H) 01/31/2024   BILITOT 0.7 01/31/2024     Assessment & Plan:  #Latent TB #Pulmonary sarcoidosis, followed by pulmonology - = On isoniazid  and B6 x 6 months.  EOT 7/10 pt completed this. - Reviewed labs LFTs on 6/9 stable.  ALP  slightly elevated which is chronic. - Methotrexate held till INH completed per patient.  Okay to start methotrexate from an infectious disease perspective when felt appropriate. -Labs today. Pt  has completed latent tb treatment - F/U with OD  PRN  Loney Stank, MD [facility]   03/21/24  12:42 PM  I have personally spent 45 minutes involved in face-to-face and non-face-to-face activities for this patient on the day of the visit. Professional time spent includes the following activities: Preparing to see the patient (review of tests), Obtaining and/or reviewing separately obtained history (admission/discharge record), Performing a medically appropriate examination and/or evaluation , Ordering medications/tests/procedures, referring and communicating with other health care professionals, Documenting clinical information in the EMR, Independently interpreting results (not separately reported), Communicating results to the patient/family/caregiver, Counseling and educating the patient/family/caregiver and Care coordination (not separately reported).

## 2024-03-22 ENCOUNTER — Ambulatory Visit: Admitting: Occupational Therapy

## 2024-03-22 ENCOUNTER — Ambulatory Visit: Admitting: Physical Therapy

## 2024-03-22 ENCOUNTER — Encounter: Payer: Self-pay | Admitting: Physical Therapy

## 2024-03-22 VITALS — BP 116/71 | HR 82

## 2024-03-22 DIAGNOSIS — R29898 Other symptoms and signs involving the musculoskeletal system: Secondary | ICD-10-CM

## 2024-03-22 DIAGNOSIS — R278 Other lack of coordination: Secondary | ICD-10-CM | POA: Diagnosis not present

## 2024-03-22 DIAGNOSIS — M6281 Muscle weakness (generalized): Secondary | ICD-10-CM

## 2024-03-22 DIAGNOSIS — R2681 Unsteadiness on feet: Secondary | ICD-10-CM

## 2024-03-22 DIAGNOSIS — R2689 Other abnormalities of gait and mobility: Secondary | ICD-10-CM

## 2024-03-22 DIAGNOSIS — R41844 Frontal lobe and executive function deficit: Secondary | ICD-10-CM

## 2024-03-22 DIAGNOSIS — R29818 Other symptoms and signs involving the nervous system: Secondary | ICD-10-CM

## 2024-03-22 NOTE — Therapy (Signed)
 OUTPATIENT PHYSICAL THERAPY NEURO TREATMENT   Patient Name: Jackie Reed MRN: [id number] DOB:23-Mar-1966, 58 y.o., female Today's Date: 03/22/2024   PCP: Waylan Almarie SAUNDERS, MD REFERRING PROVIDER: Pegge Toribio PARAS, PA-C  END OF SESSION:  PT End of Session - 03/22/24 1617     Visit Number 4    Number of Visits 13   Plus eval   Date for PT Re-Evaluation 04/20/24    Authorization Type [employer]    PT Start Time 1618    PT Stop Time 1701    PT Time Calculation (min) 43 min    Equipment Utilized During Treatment Gait belt    Activity Tolerance Patient tolerated treatment well    Behavior During Therapy WFL for tasks assessed/performed           Past Medical History:  Diagnosis Date   Adenomatous colon polyp 2017   Dyspnea    occasional   E-coli UTI 08/2019   Hepatitis 05/2020   Hepatitis B core antibody positive   Hypertension    Migraine    Plantar fibromatosis    PONV (postoperative nausea and vomiting)    Pre-diabetes    Sarcoidosis    Stroke (HCC)    TB lung, latent    Thyroid  nodule 01/29/2023   seen on PET scan   Vitamin B12 deficiency 06/2019   Vitamin D  deficiency 06/2019   Past Surgical History:  Procedure Laterality Date   ABDOMINAL HYSTERECTOMY     FINE NEEDLE ASPIRATION  02/19/2023   Procedure: FINE NEEDLE ASPIRATION (FNA) LINEAR;  Surgeon: Brenna Adine CROME, DO;  Location: [facility];  Service: Cardiopulmonary;;   FOOT SURGERY Bilateral 1985   MASS EXCISION Right 05/19/2018   Procedure: EXCISION PALMAR NODULES RIGHT HAND;  Surgeon: Murrell Drivers, MD;  Location: [facility];  Service: Orthopedics;  Laterality: Right;   PARTIAL HYSTERECTOMY  1997   VIDEO BRONCHOSCOPY WITH ENDOBRONCHIAL ULTRASOUND Bilateral 02/19/2023   Procedure: VIDEO BRONCHOSCOPY WITH ENDOBRONCHIAL ULTRASOUND;  Surgeon: Brenna Adine CROME, DO;  Location: [facility];  Service: Cardiopulmonary;  Laterality: Bilateral;   Patient Active Problem List    Diagnosis Date Noted   Hyperlipidemia 03/20/2024   Cognitive and neurobehavioral dysfunction 02/04/2024   Encephalopathy 01/28/2024   Encephalopathy, hypertensive 01/20/2024   Medication monitoring encounter 10/01/2023   TB lung, latent 08/27/2023   Sarcoidosis 04/22/2023   Adenopathy 02/09/2023   History of COVID-19 10/03/2020   Hepatitis B carrier (HCC) 07/03/2020   Mass of left hand 10/02/2019   Elevated blood pressure reading in office with diagnosis of hypertension 09/11/2019   Pre-diabetes 06/14/2019   Low back pain without sciatica 03/07/2019   Back muscle spasm 03/07/2019   History of recent fall 12/31/2018   Sprain of right ankle 11/18/2018   Class 2 severe obesity due to excess calories with serious comorbidity and body mass index (BMI) of 35.0 to 35.9 in adult [facility]) 10/31/2018   Essential hypertension, benign 11/08/2015   Tendon calcification 11/08/2015   Adjustment disorder with mixed anxiety and depressed mood 11/08/2015   Insomnia 11/08/2015    ONSET DATE: 02/03/2024 (referral)   REFERRING DIAG: G93.40 (ICD-10-CM) - Encephalopathy, unspecified  THERAPY DIAG:  Muscle weakness (generalized)  Other abnormalities of gait and mobility  Unsteadiness on feet  Other lack of coordination  Other symptoms and signs involving the nervous system  Other symptoms and signs involving the musculoskeletal system  Rationale for Evaluation and Treatment: Rehabilitation  SUBJECTIVE:  SUBJECTIVE STATEMENT: Pt presents w/RW following OT.  Denies falls.  She had a single dizzy spell this morning and her left leg feels weaker since then.  She denies any other symptoms or persistent dizziness.  The dizzy spell came and went over a couple of minutes. She continues to try her HEP.  She will keep the  ST appt scheduled on 10/20 until she checks with [facility] clinic to see if they have something sooner.  She has not bought a cane yet, but intends to do this tomorrow.   Pt accompanied by: self - husband Darin (waiting in lobby)  PERTINENT HISTORY: HTN, Sarcoidosis, hepatitis, migraines, LBP, Latent TB (positive QuantiFERON test)  PAIN:  Are you having pain? No  PRECAUTIONS: Fall  RED FLAGS: None   WEIGHT BEARING RESTRICTIONS: No  FALLS: Has patient fallen in last 6 months? No  LIVING ENVIRONMENT: Lives with: lives with their spouse Lives in: House/apartment Stairs: Yes: Internal: 2 flights steps; one set has a rail on the right side going up, other flight has no rail and External: 2 steps; none Has following equipment at home: Walker - 2 wheeled and Grab bars  PLOF: Independent  PATIENT GOALS: To get the strength back on my L side so I can get back to being independent   OBJECTIVE:  Note: Objective measures were completed at Evaluation unless otherwise noted.  DIAGNOSTIC FINDINGS:  CT of head from 01/20/24 IMPRESSION: 1. No acute intracranial abnormality. 2. Generalized atrophy and findings of chronic microvascular disease.  MRI of brain from 01/20/24 IMPRESSION: 1. No acute intracranial abnormality. 2. Findings of chronic small vessel ischemia and volume loss.  MRI of C-Spine from 01/22/24 IMPRESSION: 1. Small central to left paracentral disc protrusions at C2-3 through C5-6 as above. Secondary minor cord flattening without cord signal changes or significant spinal stenosis. Findings could contribute to left upper extremity symptoms. 2. Small right paracentral disc protrusion at C6-7 without significant stenosis.   COGNITION: Overall cognitive status: Difficulty to assess due to: no family present   SENSATION: Pt denies numbness/tingling in all extremities    POSTURE: rounded shoulders, increased thoracic kyphosis, and weight shift left  LOWER  EXTREMITY ROM:     Active  Right Eval Left Eval  Hip flexion    Hip extension    Hip abduction    Hip adduction    Hip internal rotation    Hip external rotation    Knee flexion    Knee extension    Ankle dorsiflexion    Ankle plantarflexion    Ankle inversion    Ankle eversion     (Blank rows = not tested)  LOWER EXTREMITY MMT:  Tested in seated position - inconsistent muscle contraction on LLE  MMT Right Eval Left Eval  Hip flexion 5 4+  Hip extension    Hip abduction 5 4-  Hip adduction 5 4-  Hip internal rotation    Hip external rotation    Knee flexion 5 4+  Knee extension 5 4  Ankle dorsiflexion 5 4  Ankle plantarflexion    Ankle inversion    Ankle eversion    (Blank rows = not tested)  BED MOBILITY: Pt reports independence with this, sleeping in standard bed height   TRANSFERS: Sit to stand: Modified independence  Assistive device utilized: Environmental consultant - 2 wheeled     Stand to sit: Modified independence  Assistive device utilized: Environmental consultant - 2 wheeled      RAMP:  Not tested  CURB:  Not tested  STAIRS: Findings: Level of Assistance: SBA, Stair Negotiation Technique: Step to Pattern with Bilateral Rails, Number of Stairs: 4, Height of Stairs: 6   , and Comments: Pt required min cues to remember proper BLE sequence to descent. No LOB noted, good eccentric control w/descent  GAIT: Gait pattern: step through pattern, decreased step length- Left, decreased stride length, decreased hip/knee flexion- Left, decreased ankle dorsiflexion- Left, lateral hip instability, wide BOS, and poor foot clearance- Left Distance walked: Various clinic distances  Assistive device utilized: Walker - 2 wheeled Level of assistance: Modified independence Comments: Pt ambulates slowly w/trend towards step-to pattern bilaterally. Pt w/increased stance time on LLE > RLE resulting in decreased step length/clearance of LLE.    FUNCTIONAL TESTS:      PATIENT SURVEYS:  ABC scale: The  Activities-Specific Balance Confidence (ABC) Scale 0% 10 20 30  40 50 60 70 80 90 100% No confidence<->completely confident  "How confident are you that you will not lose your balance or become unsteady when you . . .   Date tested 03/02/24  Walk around the house 20%  2. Walk up or down stairs 10%  3. Bend over and pick up a slipper from in front of a closet floor 10%  4. Reach for a small can off a shelf at eye level 50%  5. Stand on tip toes and reach for something above your head 10%  6. Stand on a chair and reach for something 0%  7. Sweep the floor 10%  8. Walk outside the house to a car parked in the driveway 50%  9. Get into or out of a car 60%  10. Walk across a parking lot to the mall 10%  11. Walk up or down a ramp 20%  12. Walk in a crowded mall where people rapidly walk past you 10%  13. Are bumped into by people as you walk through the mall 50%  14. Step onto or off of an escalator while you are holding onto the railing 20%  15. Step onto or off an escalator while holding onto parcels such that you cannot hold onto the railing 10%  16. Walk outside on icy sidewalks 10%  Total: #/16 350/16 = 21.875%    VITALS  Vitals:   03/22/24 1622 03/22/24 1639  BP: (!) 116/42 116/71  Pulse: 86 82                                                                                                                           TREATMENT:   Self-care/home management  Assessed LUE vitals (see above) and WNL, but concerned for low diastolic as pt reports this is unusual for her. She states she took a double dose of her medicines yesterday without meaning to and did not take it today and is wondering if this caused this issue - PT confirmed she uses a medication sorter but got her days mixed up.  NMR  SciFit multi-peaks level 6 for 8 minutes using BUE/BLEs for neural priming for reciprocal movement, dynamic cardiovascular warmup and increased amplitude of stepping. RPE of 5/10 following  activity.  BP reassessed following task due to low diastolic (see above). 4 hurdles w/ BUE support in // bars 4x8 feet forward (2x RLE leading then 2x LLE leading) > laterally 2x8 ft each direction 6 left step ups x10 w/ increased time to complete w/ BUE on rails, she has variable left toe catch  PATIENT EDUCATION: Education details: Continue HEP, where to purchase cane and clearance to use it at home, return to regulated med schedule and monitor BP response over coming days and if symptoms worsen please contact PCP (go to ED if urgent symptom change).   Person educated: Patient Education method: Medical illustrator Education comprehension: verbalized understanding and needs further education  HOME EXERCISE PROGRAM: Access Code: [email or web address] URL: [email or web address] Date: 03/09/2024 Prepared by: Daved Bull  Exercises - Sit to Stand with Armchair  - 1 x daily - 7 x weekly - 2 sets - 10 reps - Standing March with Counter Support  - 1 x daily - 4 x weekly - 2 sets - 10 reps - Standing Quarter Turn with Counter Support  - 1 x daily - 4 x weekly - 1-2 sets - 10 reps - Standing Hip Abduction with Counter Support  - 1 x daily - 4 x weekly - 2 sets - 10 reps  GOALS: Goals reviewed with patient? Yes  SHORT TERM GOALS: Target date: 03/30/2024    Pt will improve score on ABC scale to >/= 35% for improved confidence in mobility and reduced fall risk  Baseline: 21.875% (7/10) Goal status: INITIAL  2.  Pt will ascend/descend 16 steps w/single rail on R w/step-through pattern and SBA for improved functional strength and safety in home environment  Baseline: SBA w/bilateral rails  Goal status: INITIAL  3.  Pt will improve gait velocity to at least 0.5 m/s w/LRAD for improved gait efficiency and reduced fall risk   Baseline: 0.41 m/s w/RW  Goal status: INITIAL  4.  Pt will improve 5 x STS to less than or equal to 22 seconds w/no UE support to demonstrate improved  functional strength and transfer efficiency.   Baseline: 24.16s w/BUE support  Goal status: INITIAL   LONG TERM GOALS: Target date: 04/13/2024   Pt will ascend/descend 16 steps w/no handrails w/SBA for improved BLE strength and safety in home environment.  Baseline:  Goal status: INITIAL  2.  Pt will improve 5 x STS to less than or equal to 18 seconds w/no UE support to demonstrate improved functional strength and transfer efficiency.   Baseline: 24.16s w/BUE support  Goal status: INITIAL  3.  Pt will improve score on ABC Scale to >/= 50% for improved functional mobility and reduced fall risk  Baseline: 21.875% (7/10) Goal status: INITIAL  4.  Pt will improve gait velocity to at least 0.6 m/s w/LRAD for improved gait efficiency and reduced fall risk   Baseline: 0.41 m/s w/RW Goal status: INITIAL   ASSESSMENT:  CLINICAL IMPRESSION: Emphasis of skilled PT session on continuing to address L NMR and strengthening.  She accidentally took a double dose of her BP meds yesterday and this may have contributed to low diastolic noted today.  She felt a bit weaker on her LLE today, but had no other acute changes or symptoms.  PT will continue to monitor and progress to higher level obstacle management and  gait training to improve mobility as able. Continue per POC.  OBJECTIVE IMPAIRMENTS: Abnormal gait, decreased activity tolerance, decreased balance, decreased knowledge of condition, decreased mobility, difficulty walking, decreased strength, decreased safety awareness, impaired perceived functional ability, and improper body mechanics.   ACTIVITY LIMITATIONS: carrying, lifting, squatting, stairs, and locomotion level  PARTICIPATION LIMITATIONS: meal prep, cleaning, laundry, driving, shopping, community activity, occupation, and yard work  PERSONAL FACTORS: Behavior pattern, Fitness, and 1 comorbidity: Encephalopathy vs Adjustment Disorder are also affecting patient's functional outcome.    REHAB POTENTIAL: Good  CLINICAL DECISION MAKING: Evolving/moderate complexity  EVALUATION COMPLEXITY: Moderate  PLAN:  PT FREQUENCY: 2x/week  PT DURATION: 6 weeks  PLANNED INTERVENTIONS: 97164- PT Re-evaluation, 97750- Physical Performance Testing, 97110-Therapeutic exercises, 97530- Therapeutic activity, [id number]- Neuromuscular re-education, 97535- Self Care, 02859- Manual therapy, [id number]- Gait training, [phone]- Aquatic Therapy, [phone]- Electrical stimulation (manual), [phone] (1-2 muscles), 20561 (3+ muscles)- Dry Needling, Patient/Family education, Balance training, Stair training, Joint mobilization, Spinal mobilization, Vestibular training, and DME instructions  PLAN FOR NEXT SESSION: Expand HEP for LLE NMR, staggered STS/squats, endurance and confidence w/mobility. SciFit for endurance. STAIRS. Eccentric control, step clearance w/LLE and gait w/reduced reliance on BUEs, L step ups forward and laterally, progress hurdles to no UE support, modified SLS, did she get cane?  Did her BP improve at home?   Daved KATHEE Bull, PT, DPT 03/22/2024, 5:06 PM

## 2024-03-22 NOTE — Therapy (Signed)
 OUTPATIENT OCCUPATIONAL THERAPY NEURO TREATMENT  Patient Name: Jackie Reed MRN: [id number] DOB:Oct 25, 1965, 58 y.o., female Today's Date: 03/22/2024  PCP: Waylan Almarie SAUNDERS, MD  REFERRING PROVIDER: Pegge Toribio PARAS, PA-C  END OF SESSION:  OT End of Session - 03/22/24 1539     Visit Number 4    Number of Visits 13    Date for OT Re-Evaluation 04/14/24    Authorization Type [employer]    OT Start Time 1538    OT Stop Time 1616    OT Time Calculation (min) 38 min    Equipment Utilized During Treatment Flex bar, chain game    Activity Tolerance Patient tolerated treatment well    Behavior During Therapy WFL for tasks assessed/performed         Past Medical History:  Diagnosis Date   Adenomatous colon polyp 2017   Dyspnea    occasional   E-coli UTI 08/2019   Hepatitis 05/2020   Hepatitis B core antibody positive   Hypertension    Migraine    Plantar fibromatosis    PONV (postoperative nausea and vomiting)    Pre-diabetes    Sarcoidosis    Stroke (HCC)    TB lung, latent    Thyroid  nodule 01/29/2023   seen on PET scan   Vitamin B12 deficiency 06/2019   Vitamin D  deficiency 06/2019   Past Surgical History:  Procedure Laterality Date   ABDOMINAL HYSTERECTOMY     FINE NEEDLE ASPIRATION  02/19/2023   Procedure: FINE NEEDLE ASPIRATION (FNA) LINEAR;  Surgeon: Brenna Adine CROME, DO;  Location: [facility];  Service: Cardiopulmonary;;   FOOT SURGERY Bilateral 1985   MASS EXCISION Right 05/19/2018   Procedure: EXCISION PALMAR NODULES RIGHT HAND;  Surgeon: Murrell Drivers, MD;  Location: [facility];  Service: Orthopedics;  Laterality: Right;   PARTIAL HYSTERECTOMY  1997   VIDEO BRONCHOSCOPY WITH ENDOBRONCHIAL ULTRASOUND Bilateral 02/19/2023   Procedure: VIDEO BRONCHOSCOPY WITH ENDOBRONCHIAL ULTRASOUND;  Surgeon: Brenna Adine CROME, DO;  Location: [facility];  Service: Cardiopulmonary;  Laterality: Bilateral;   Patient Active Problem List   Diagnosis Date Noted    Hyperlipidemia 03/20/2024   Cognitive and neurobehavioral dysfunction 02/04/2024   Encephalopathy 01/28/2024   Encephalopathy, hypertensive 01/20/2024   Medication monitoring encounter 10/01/2023   TB lung, latent 08/27/2023   Sarcoidosis 04/22/2023   Adenopathy 02/09/2023   History of COVID-19 10/03/2020   Hepatitis B carrier (HCC) 07/03/2020   Mass of left hand 10/02/2019   Elevated blood pressure reading in office with diagnosis of hypertension 09/11/2019   Pre-diabetes 06/14/2019   Low back pain without sciatica 03/07/2019   Back muscle spasm 03/07/2019   History of recent fall 12/31/2018   Sprain of right ankle 11/18/2018   Class 2 severe obesity due to excess calories with serious comorbidity and body mass index (BMI) of 35.0 to 35.9 in adult [facility]) 10/31/2018   Essential hypertension, benign 11/08/2015   Tendon calcification 11/08/2015   Adjustment disorder with mixed anxiety and depressed mood 11/08/2015   Insomnia 11/08/2015    ONSET DATE: 02/04/2024 (Date of referral)  REFERRING DIAG: G93.40 (ICD-10-CM) - Encephalopathy, unspecified   THERAPY DIAG:  Muscle weakness (generalized)  Other lack of coordination  Other symptoms and signs involving the nervous system  Other symptoms and signs involving the musculoskeletal system  Frontal lobe and executive function deficit  Rationale for Evaluation and Treatment: Rehabilitation  SUBJECTIVE:   SUBJECTIVE STATEMENT: Pt reports doing better with most ADLs and IADLs. She is not  able to see ST at this clinic until Oct.   Pt accompanied by: self  PERTINENT HISTORY: PMH: HTN, Sarcoidosis, hepatitis, migraines, LBP, Latent TB (positive QuantiFERON test)   PRECAUTIONS: Fall  WEIGHT BEARING RESTRICTIONS: No  PAIN:  Are you having pain? No  FALLS: Has patient fallen in last 6 months? No  LIVING ENVIRONMENT: Lives with: lives with their spouse Lives in: House/apartment Stairs: Yes: Internal: splint level steps; on  right going up and no rail to go downstairs and External: 2 steps; none Has following equipment at home: Vannie - 2 wheeled  PLOF: Independent; driving; phone scheduler with computer work; reading; puzzles (crosswords)  PATIENT GOALS: return to work/PLOF  OBJECTIVE:  Note: Objective measures were completed at Evaluation unless otherwise noted.  HAND DOMINANCE: Left  ADLs: Overall ADLs: mod I (increased time)  Eating: unsure - hasn't tried  IADLs: Shopping: max A Light housekeeping: dependent Meal Prep: mod I for light meal prep;  Community mobility: dependent Medication management: independent Financial management: independent Handwriting: Increased time and 50% of prior level per pt report  MOBILITY STATUS: Independent  ACTIVITY TOLERANCE: Activity tolerance: fair  FUNCTIONAL OUTCOME MEASURES: PSFS: 5.0 total score   UPPER EXTREMITY ROM:    BUE: WNL  UPPER EXTREMITY MMT:     MMT Right (eval) Left (eval)  Shoulder flexion WNL 4/5  Shoulder abduction WNL 4/5  Elbow flexion WNL 4/5  Elbow extension WNL 4/5  (Blank rows = not tested)  HAND FUNCTION: Grip strength: Right: 49.6 lbs; Left: 19.6 lbs  COORDINATION: 9 Hole Peg test: Right: 19 sec; Left: 32 sec  SENSATION: WFL  EDEMA: none reported or observed  MUSCLE TONE: WFL  COGNITION: Overall cognitive status: Impaired and reports more difficulty with memory since hospitalization  VISION: Subjective report: no changes Baseline vision: Wears glasses all the time and progressive lenses Visual history: none  VISION ASSESSMENT: WFL  PERCEPTION: WFL  PRAXIS: Impaired: Motor planning  OBSERVATIONS: Pt ambulates with use of RW. No loss of balance. The pt appears well kept and has glasses donned.                                                                                                                          TODAY'S TREATMENT :    - Therapeutic activities completed for duration as noted  below including:  OT educated pt on table top play of Golf Solitaire for LUE to address fine motor coordination, gross motor coordination, upper extremity range of motion, reaction time, scanning and locating of items, processing, bimanual coordination/trunk control, sequencing of unfamiliar motor movements or tasks, motor planning, item/pattern recognition, and endurance/stamina. Pt required moderate cues for proper play.   Patient engaged in color Sudoku activity using LUE to address attention, problem-solving, deductive reasoning, visual scanning, tolerance, and task persistence, and spatial organization. Required occasional redirection to check for duplicates and min cueing for proper play.   PATIENT EDUCATION: Education details: Functional activities  Person educated: Patient Education  method: Explanation and Verbal cues Education comprehension: verbalized understanding, verbal cues required, and needs further education  HOME EXERCISE PROGRAM: 03/07/2024: putty and coordination HEPs  GOALS:  SHORT TERM GOALS: Target date: 03/31/2024  Patient will demonstrate initial L UE HEP with 25% verbal cues or less for proper execution. Baseline: Goal status: MET  2.  Pt will report no more than mild difficulty cutting food utilizing L hand.  Baseline: has not tried Goal status: MET   LONG TERM GOALS: Target date: 04/14/24   Patient will demonstrate updated L UE HEP with visual handouts only for proper execution. Baseline:  Goal status: IN Progress  2.  Patient will demo improved FM coordination as evidenced by completing nine-hole peg with use of L in 25 seconds or less. Baseline: Right: 19 sec; Left: 32 sec Goal status: IN Progress  3.  Patient will demonstrate at least 30 lbs L grip strength as needed to open jars and other containers. Baseline: Right: 49.6 lbs; Left: 19.6 lbs Goal status: IN Progress  4.  Patient will report at least two-point increase in average PSFS score or at  least three-point increase in a single activity score indicating functionally significant improvement given minimum detectable change.  Baseline: 5.0 total score (See above for individual activity scores) Goal status: INITIAL  ASSESSMENT:  CLINICAL IMPRESSION: Patient demonstrating good progression towards goals though requires increased cueing and time for sequencing and deductive reasoning. Will continue to work towards remaining goals.  PERFORMANCE DEFICITS: in functional skills including ADLs, IADLs, coordination, strength, Fine motor control, endurance, decreased knowledge of use of DME, vision, and UE functional use and cognitive skills including memory.   IMPAIRMENTS: are limiting patient from ADLs, IADLs, work, leisure, and social participation.   CO-MORBIDITIES: may have co-morbidities  that affects occupational performance. Patient will benefit from skilled OT to address above impairments and improve overall function.  REHAB POTENTIAL: Good  PLAN:  OT FREQUENCY: 2x/week  OT DURATION: 6 weeks  PLANNED INTERVENTIONS: 97168 OT Re-evaluation, 97535 self care/ADL training, 02889 therapeutic exercise, 97530 therapeutic activity, 97112 neuromuscular re-education, 97035 ultrasound, 97018 paraffin, 02960 fluidotherapy, 97010 moist heat, 97034 contrast bath, functional mobility training, coping strategies training, patient/family education, and DME and/or AE instructions  RECOMMENDED OTHER SERVICES: ST referral  CONSULTED AND AGREED WITH PLAN OF CARE: Patient  PLAN FOR NEXT SESSION: update for ST referral; review golf solitaire; progress toward LTGs   Jocelyn CHRISTELLA Bottom, OT 03/22/2024, 3:40 PM

## 2024-03-28 ENCOUNTER — Ambulatory Visit: Admitting: Occupational Therapy

## 2024-03-28 ENCOUNTER — Telehealth (HOSPITAL_BASED_OUTPATIENT_CLINIC_OR_DEPARTMENT_OTHER): Payer: Self-pay | Admitting: Pulmonary Disease

## 2024-03-28 ENCOUNTER — Ambulatory Visit: Attending: Physician Assistant | Admitting: Physical Therapy

## 2024-03-28 VITALS — BP 113/80 | HR 86

## 2024-03-28 DIAGNOSIS — R278 Other lack of coordination: Secondary | ICD-10-CM

## 2024-03-28 DIAGNOSIS — R41841 Cognitive communication deficit: Secondary | ICD-10-CM | POA: Insufficient documentation

## 2024-03-28 DIAGNOSIS — R41842 Visuospatial deficit: Secondary | ICD-10-CM | POA: Insufficient documentation

## 2024-03-28 DIAGNOSIS — M6281 Muscle weakness (generalized): Secondary | ICD-10-CM

## 2024-03-28 DIAGNOSIS — R41844 Frontal lobe and executive function deficit: Secondary | ICD-10-CM | POA: Insufficient documentation

## 2024-03-28 DIAGNOSIS — R29898 Other symptoms and signs involving the musculoskeletal system: Secondary | ICD-10-CM | POA: Insufficient documentation

## 2024-03-28 DIAGNOSIS — R2689 Other abnormalities of gait and mobility: Secondary | ICD-10-CM | POA: Diagnosis present

## 2024-03-28 DIAGNOSIS — G9340 Encephalopathy, unspecified: Secondary | ICD-10-CM

## 2024-03-28 DIAGNOSIS — R29818 Other symptoms and signs involving the nervous system: Secondary | ICD-10-CM | POA: Insufficient documentation

## 2024-03-28 DIAGNOSIS — R2681 Unsteadiness on feet: Secondary | ICD-10-CM | POA: Insufficient documentation

## 2024-03-28 NOTE — Telephone Encounter (Signed)
 Patient is scheduled on 9/17 with Dr Kassie however, she has a PFT scheduled for 9/22. The visit with the doctor needs to come AFTER the PFT has been completed. Left voicemail for patient explaining this. When patient calls back, please transfer to [facility] to reschedule as there are held spots for this.

## 2024-03-28 NOTE — Therapy (Addendum)
 OUTPATIENT PHYSICAL THERAPY NEURO TREATMENT   Patient Name: Jackie Reed MRN: [id number] DOB:1965/08/28, 58 y.o., female Today's Date: 03/28/2024   PCP: Waylan Almarie SAUNDERS, MD REFERRING PROVIDER: Pegge Toribio PARAS, PA-C  END OF SESSION:  PT End of Session - 03/28/24 1533     Visit Number 5    Number of Visits 13   Plus eval   Date for PT Re-Evaluation 04/20/24    Authorization Type [employer]    PT Start Time 1532    PT Stop Time 1618    PT Time Calculation (min) 46 min    Equipment Utilized During Treatment Gait belt    Activity Tolerance Patient tolerated treatment well    Behavior During Therapy [facility] for tasks assessed/performed            Past Medical History:  Diagnosis Date   Adenomatous colon polyp 2017   Dyspnea    occasional   E-coli UTI 08/2019   Hepatitis 05/2020   Hepatitis B core antibody positive   Hypertension    Migraine    Plantar fibromatosis    PONV (postoperative nausea and vomiting)    Pre-diabetes    Sarcoidosis    Stroke (HCC)    TB lung, latent    Thyroid  nodule 01/29/2023   seen on PET scan   Vitamin B12 deficiency 06/2019   Vitamin D  deficiency 06/2019   Past Surgical History:  Procedure Laterality Date   ABDOMINAL HYSTERECTOMY     FINE NEEDLE ASPIRATION  02/19/2023   Procedure: FINE NEEDLE ASPIRATION (FNA) LINEAR;  Surgeon: Brenna Adine CROME, DO;  [facility];  Service: Cardiopulmonary;;   FOOT SURGERY Bilateral 1985   MASS EXCISION Right 05/19/2018   Procedure: EXCISION PALMAR NODULES RIGHT HAND;  Surgeon: Murrell Drivers, MD;  Location: [facility];  Service: Orthopedics;  Laterality: Right;   PARTIAL HYSTERECTOMY  1997   VIDEO BRONCHOSCOPY WITH ENDOBRONCHIAL ULTRASOUND Bilateral 02/19/2023   Procedure: VIDEO BRONCHOSCOPY WITH ENDOBRONCHIAL ULTRASOUND;  Surgeon: Brenna Adine CROME, DO;  Location: [facility];  Service: Cardiopulmonary;  Laterality: Bilateral;   Patient Active Problem List    Diagnosis Date Noted   Hyperlipidemia 03/20/2024   Cognitive and neurobehavioral dysfunction 02/04/2024   Encephalopathy 01/28/2024   Encephalopathy, hypertensive 01/20/2024   Medication monitoring encounter 10/01/2023   TB lung, latent 08/27/2023   Sarcoidosis 04/22/2023   Adenopathy 02/09/2023   History of COVID-19 10/03/2020   Hepatitis B carrier (HCC) 07/03/2020   Mass of left hand 10/02/2019   Elevated blood pressure reading in office with diagnosis of hypertension 09/11/2019   Pre-diabetes 06/14/2019   Low back pain without sciatica 03/07/2019   Back muscle spasm 03/07/2019   History of recent fall 12/31/2018   Sprain of right ankle 11/18/2018   Class 2 severe obesity due to excess calories with serious comorbidity and body mass index (BMI) of 35.0 to 35.9 in adult [facility]) 10/31/2018   Essential hypertension, benign 11/08/2015   Tendon calcification 11/08/2015   Adjustment disorder with mixed anxiety and depressed mood 11/08/2015   Insomnia 11/08/2015    ONSET DATE: 02/03/2024 (referral)   REFERRING DIAG: G93.40 (ICD-10-CM) - Encephalopathy, unspecified  THERAPY DIAG:  Muscle weakness (generalized)  Other lack of coordination  Other abnormalities of gait and mobility  Rationale for Evaluation and Treatment: Rehabilitation  SUBJECTIVE:  SUBJECTIVE STATEMENT: Pt presents w/RW. States she has not obtained a cane yet, wants to continue practicing with it in PT. No falls or acute changes.    Pt accompanied by: self - husband Darin (waiting in lobby)  PERTINENT HISTORY: HTN, Sarcoidosis, hepatitis, migraines, LBP, Latent TB (positive QuantiFERON test)  PAIN:  Are you having pain? No  PRECAUTIONS: Fall  RED FLAGS: None   WEIGHT BEARING RESTRICTIONS: No  FALLS: Has patient fallen  in last 6 months? No  LIVING ENVIRONMENT: Lives with: lives with their spouse Lives in: House/apartment Stairs: Yes: Internal: 2 flights steps; one set has a rail on the right side going up, other flight has no rail and External: 2 steps; none Has following equipment at home: Walker - 2 wheeled and Grab bars  PLOF: Independent  PATIENT GOALS: To get the strength back on my L side so I can get back to being independent   OBJECTIVE:  Note: Objective measures were completed at Evaluation unless otherwise noted.  DIAGNOSTIC FINDINGS:  CT of head from 01/20/24 IMPRESSION: 1. No acute intracranial abnormality. 2. Generalized atrophy and findings of chronic microvascular disease.  MRI of brain from 01/20/24 IMPRESSION: 1. No acute intracranial abnormality. 2. Findings of chronic small vessel ischemia and volume loss.  MRI of C-Spine from 01/22/24 IMPRESSION: 1. Small central to left paracentral disc protrusions at C2-3 through C5-6 as above. Secondary minor cord flattening without cord signal changes or significant spinal stenosis. Findings could contribute to left upper extremity symptoms. 2. Small right paracentral disc protrusion at C6-7 without significant stenosis.   COGNITION: Overall cognitive status: Difficulty to assess due to: no family present   SENSATION: Pt denies numbness/tingling in all extremities    POSTURE: rounded shoulders, increased thoracic kyphosis, and weight shift left  LOWER EXTREMITY ROM:     Active  Right Eval Left Eval  Hip flexion    Hip extension    Hip abduction    Hip adduction    Hip internal rotation    Hip external rotation    Knee flexion    Knee extension    Ankle dorsiflexion    Ankle plantarflexion    Ankle inversion    Ankle eversion     (Blank rows = not tested)  LOWER EXTREMITY MMT:  Tested in seated position - inconsistent muscle contraction on LLE  MMT Right Eval Left Eval  Hip flexion 5 4+  Hip extension     Hip abduction 5 4-  Hip adduction 5 4-  Hip internal rotation    Hip external rotation    Knee flexion 5 4+  Knee extension 5 4  Ankle dorsiflexion 5 4  Ankle plantarflexion    Ankle inversion    Ankle eversion    (Blank rows = not tested)  BED MOBILITY: Pt reports independence with this, sleeping in standard bed height   TRANSFERS: Sit to stand: Modified independence  Assistive device utilized: Environmental consultant - 2 wheeled     Stand to sit: Modified independence  Assistive device utilized: Environmental consultant - 2 wheeled      RAMP:  Not tested  CURB:  Not tested  STAIRS: Findings: Level of Assistance: SBA, Stair Negotiation Technique: Step to Pattern with Bilateral Rails, Number of Stairs: 4, Height of Stairs: 6   , and Comments: Pt required min cues to remember proper BLE sequence to descent. No LOB noted, good eccentric control w/descent  GAIT: Gait pattern: step through pattern, decreased step length- Left, decreased stride length,  decreased hip/knee flexion- Left, decreased ankle dorsiflexion- Left, lateral hip instability, wide BOS, and poor foot clearance- Left Distance walked: Various clinic distances  Assistive device utilized: Walker - 2 wheeled Level of assistance: Modified independence Comments: Pt ambulates slowly w/trend towards step-to pattern bilaterally. Pt w/increased stance time on LLE > RLE resulting in decreased step length/clearance of LLE.    FUNCTIONAL TESTS:      PATIENT SURVEYS:  ABC scale: The Activities-Specific Balance Confidence (ABC) Scale 0% 10 20 30  40 50 60 70 80 90 100% No confidence<->completely confident  "How confident are you that you will not lose your balance or become unsteady when you . . .   Date tested 03/02/24  Walk around the house 20%  2. Walk up or down stairs 10%  3. Bend over and pick up a slipper from in front of a closet floor 10%  4. Reach for a small can off a shelf at eye level 50%  5. Stand on tip toes and reach for something above  your head 10%  6. Stand on a chair and reach for something 0%  7. Sweep the floor 10%  8. Walk outside the house to a car parked in the driveway 50%  9. Get into or out of a car 60%  10. Walk across a parking lot to the mall 10%  11. Walk up or down a ramp 20%  12. Walk in a crowded mall where people rapidly walk past you 10%  13. Are bumped into by people as you walk through the mall 50%  14. Step onto or off of an escalator while you are holding onto the railing 20%  15. Step onto or off an escalator while holding onto parcels such that you cannot hold onto the railing 10%  16. Walk outside on icy sidewalks 10%  Total: #/16 350/16 = 21.875%    VITALS  Vitals:   03/28/24 1557  BP: 113/80  Pulse: 86                                                                                                                            TREATMENT:    NMR/self-care/TA In // bars for improved lateral weight shifting, step clearance, LE coordination and single leg stability:  Lateral adv/retreat over 4 hurdle w/BUE support x5 reps and progressing to single UE support x10 reps.  Fwd adv/retreat over 4 hurdle, x14 reps per side w/single UE support.  On airex, lateral cone taps w/BUE support, x10 reps per side. Pt required max encouragement to perform initially, stating she can't. However, pt able to perform without instability. Min cues for upright posture and reduced lateral lean compensation throughout.  Assessed vitals per pt report (see above) and WNL Constructed obstacle course consisting of various height obstacles, gum drops and blue mat w/SBQC for improved anticipatory balance strategies and confidence w/cane. Pt performed 3 reps down and back w/min A initially and progressing to SBA only.  SciFit multi-peaks level 11.0 for 8 minutes using BUE/BLEs for neural priming for reciprocal movement, dynamic cardiovascular warmup and increased amplitude of stepping. RPE of 6/10 following activity     PATIENT EDUCATION: Education details: Continue HEP, continued encouragement to purchase [facility] and practice with it at home.  Person educated: Patient Education method: Medical illustrator Education comprehension: verbalized understanding and needs further education  HOME EXERCISE PROGRAM: Access Code: [email or web address] URL: [email or web address] Date: 03/09/2024 Prepared by: Daved Bull  Exercises - Sit to Stand with Armchair  - 1 x daily - 7 x weekly - 2 sets - 10 reps - Standing March with Counter Support  - 1 x daily - 4 x weekly - 2 sets - 10 reps - Standing Quarter Turn with Counter Support  - 1 x daily - 4 x weekly - 1-2 sets - 10 reps - Standing Hip Abduction with Counter Support  - 1 x daily - 4 x weekly - 2 sets - 10 reps  GOALS: Goals reviewed with patient? Yes  SHORT TERM GOALS: Target date: 03/30/2024    Pt will improve score on ABC scale to >/= 35% for improved confidence in mobility and reduced fall risk  Baseline: 21.875% (7/10) Goal status: INITIAL  2.  Pt will ascend/descend 16 steps w/single rail on R w/step-through pattern and SBA for improved functional strength and safety in home environment  Baseline: SBA w/bilateral rails  Goal status: INITIAL  3.  Pt will improve gait velocity to at least 0.5 m/s w/LRAD for improved gait efficiency and reduced fall risk   Baseline: 0.41 m/s w/RW  Goal status: INITIAL  4.  Pt will improve 5 x STS to less than or equal to 22 seconds w/no UE support to demonstrate improved functional strength and transfer efficiency.   Baseline: 24.16s w/BUE support  Goal status: INITIAL   LONG TERM GOALS: Target date: 04/13/2024   Pt will ascend/descend 16 steps w/no handrails w/SBA for improved BLE strength and safety in home environment.  Baseline:  Goal status: INITIAL  2.  Pt will improve 5 x STS to less than or equal to 18 seconds w/no UE support to demonstrate improved functional strength and  transfer efficiency.   Baseline: 24.16s w/BUE support  Goal status: INITIAL  3.  Pt will improve score on ABC Scale to >/= 50% for improved functional mobility and reduced fall risk  Baseline: 21.875% (7/10) Goal status: INITIAL  4.  Pt will improve gait velocity to at least 0.6 m/s w/LRAD for improved gait efficiency and reduced fall risk   Baseline: 0.41 m/s w/RW Goal status: INITIAL   ASSESSMENT:  CLINICAL IMPRESSION: Emphasis of skilled PT session on single leg stability, balance strategies w/use of SBQC and functional hip strength. Pt's BP was WNL this date. Pt reports she has not purchased a SBQC this date due to decreased confidence and want to practice more in PT. Pt able to navigate up to 6 obstacles and unlevel surfaces w/SBQC and SBA this date, so encouraged pt to purchase cane and practice at home. Continue POC.   OBJECTIVE IMPAIRMENTS: Abnormal gait, decreased activity tolerance, decreased balance, decreased knowledge of condition, decreased mobility, difficulty walking, decreased strength, decreased safety awareness, impaired perceived functional ability, and improper body mechanics.   ACTIVITY LIMITATIONS: carrying, lifting, squatting, stairs, and locomotion level  PARTICIPATION LIMITATIONS: meal prep, cleaning, laundry, driving, shopping, community activity, occupation, and yard work  PERSONAL FACTORS: Behavior pattern, Fitness, and 1 comorbidity: Encephalopathy vs Adjustment Disorder are also affecting  patient's functional outcome.   REHAB POTENTIAL: Good  CLINICAL DECISION MAKING: Evolving/moderate complexity  EVALUATION COMPLEXITY: Moderate  PLAN:  PT FREQUENCY: 2x/week  PT DURATION: 6 weeks  PLANNED INTERVENTIONS: 97164- PT Re-evaluation, 97750- Physical Performance Testing, 97110-Therapeutic exercises, 97530- Therapeutic activity, [id number]- Neuromuscular re-education, 97535- Self Care, [id number]- Manual therapy, [id number]- Gait training, [phone]- Aquatic Therapy,  [phone]- Electrical stimulation (manual), [phone] (1-2 muscles), [id number] (3+ muscles)- Dry Needling, Patient/Family education, Balance training, Stair training, Joint mobilization, Spinal mobilization, Vestibular training, and DME instructions  PLAN FOR NEXT SESSION: STGs. Expand HEP for LLE NMR, staggered STS/squats, endurance and confidence w/mobility. SciFit for endurance. STAIRS. Eccentric control, step clearance w/LLE and gait w/reduced reliance on BUEs, L step ups forward and laterally, progress hurdles to no UE support, modified SLS, did she get cane?  Did her BP improve at home?   Lielle Vandervort E Jomel Whittlesey, PT, DPT 03/28/2024, 4:25 PM

## 2024-03-28 NOTE — Therapy (Signed)
 OUTPATIENT OCCUPATIONAL THERAPY NEURO TREATMENT  Patient Name: Jackie Reed MRN: [id number] DOB:05-23-1966, 58 y.o., female Today's Date: 03/28/2024  PCP: Waylan Almarie SAUNDERS, MD  REFERRING PROVIDER: Pegge Toribio PARAS, PA-C  END OF SESSION:  OT End of Session - 03/28/24 1453     Visit Number 5    Number of Visits 13    Date for OT Re-Evaluation 04/14/24    Authorization Type [employer]    OT Start Time 1452    OT Stop Time 1530    OT Time Calculation (min) 38 min    Activity Tolerance Patient tolerated treatment well    Behavior During Therapy [facility] for tasks assessed/performed         Past Medical History:  Diagnosis Date   Adenomatous colon polyp 2017   Dyspnea    occasional   E-coli UTI 08/2019   Hepatitis 05/2020   Hepatitis B core antibody positive   Hypertension    Migraine    Plantar fibromatosis    PONV (postoperative nausea and vomiting)    Pre-diabetes    Sarcoidosis    Stroke (HCC)    TB lung, latent    Thyroid  nodule 01/29/2023   seen on PET scan   Vitamin B12 deficiency 06/2019   Vitamin D  deficiency 06/2019   Past Surgical History:  Procedure Laterality Date   ABDOMINAL HYSTERECTOMY     FINE NEEDLE ASPIRATION  02/19/2023   Procedure: FINE NEEDLE ASPIRATION (FNA) LINEAR;  Surgeon: Brenna Adine CROME, DO;  Location: [facility];  Service: Cardiopulmonary;;   FOOT SURGERY Bilateral 1985   MASS EXCISION Right 05/19/2018   Procedure: EXCISION PALMAR NODULES RIGHT HAND;  Surgeon: Murrell Drivers, MD;  Location: [facility];  Service: Orthopedics;  Laterality: Right;   PARTIAL HYSTERECTOMY  1997   VIDEO BRONCHOSCOPY WITH ENDOBRONCHIAL ULTRASOUND Bilateral 02/19/2023   Procedure: VIDEO BRONCHOSCOPY WITH ENDOBRONCHIAL ULTRASOUND;  Surgeon: Brenna Adine CROME, DO;  Location: [facility];  Service: Cardiopulmonary;  Laterality: Bilateral;   Patient Active Problem List   Diagnosis Date Noted   Hyperlipidemia 03/20/2024   Cognitive and neurobehavioral  dysfunction 02/04/2024   Encephalopathy 01/28/2024   Encephalopathy, hypertensive 01/20/2024   Medication monitoring encounter 10/01/2023   TB lung, latent 08/27/2023   Sarcoidosis 04/22/2023   Adenopathy 02/09/2023   History of COVID-19 10/03/2020   Hepatitis B carrier (HCC) 07/03/2020   Mass of left hand 10/02/2019   Elevated blood pressure reading in office with diagnosis of hypertension 09/11/2019   Pre-diabetes 06/14/2019   Low back pain without sciatica 03/07/2019   Back muscle spasm 03/07/2019   History of recent fall 12/31/2018   Sprain of right ankle 11/18/2018   Class 2 severe obesity due to excess calories with serious comorbidity and body mass index (BMI) of 35.0 to 35.9 in adult [facility]) 10/31/2018   Essential hypertension, benign 11/08/2015   Tendon calcification 11/08/2015   Adjustment disorder with mixed anxiety and depressed mood 11/08/2015   Insomnia 11/08/2015    ONSET DATE: 02/04/2024 (Date of referral)  REFERRING DIAG: G93.40 (ICD-10-CM) - Encephalopathy, unspecified   THERAPY DIAG:  Muscle weakness (generalized)  Other lack of coordination  Other symptoms and signs involving the nervous system  Other symptoms and signs involving the musculoskeletal system  Encephalopathy, unspecified type  Rationale for Evaluation and Treatment: Rehabilitation  SUBJECTIVE:   SUBJECTIVE STATEMENT: Pt reports difficulty with typing and opening tops of bottles using L hand.   Pt accompanied by: self  PERTINENT HISTORY: PMH: HTN, Sarcoidosis, hepatitis,  migraines, LBP, Latent TB (positive QuantiFERON test)   PRECAUTIONS: Fall  WEIGHT BEARING RESTRICTIONS: No  PAIN:  Are you having pain? No  FALLS: Has patient fallen in last 6 months? No  LIVING ENVIRONMENT: Lives with: lives with their spouse Lives in: House/apartment Stairs: Yes: Internal: splint level steps; on right going up and no rail to go downstairs and External: 2 steps; none Has following equipment  at home: Vannie - 2 wheeled  PLOF: Independent; driving; phone scheduler with computer work; reading; puzzles (crosswords)  PATIENT GOALS: return to work/PLOF  OBJECTIVE:  Note: Objective measures were completed at Evaluation unless otherwise noted.  HAND DOMINANCE: Left  ADLs: Overall ADLs: mod I (increased time)  Eating: unsure - hasn't tried  IADLs: Shopping: max A Light housekeeping: dependent Meal Prep: mod I for light meal prep;  Community mobility: dependent Medication management: independent Financial management: independent Handwriting: Increased time and 50% of prior level per pt report  MOBILITY STATUS: Independent  ACTIVITY TOLERANCE: Activity tolerance: fair  FUNCTIONAL OUTCOME MEASURES: PSFS: 5.0 total score 03/28/2024: 6.3 total score   UPPER EXTREMITY ROM:    BUE: WNL  UPPER EXTREMITY MMT:     MMT Right (eval) Left (eval)  Shoulder flexion WNL 4/5  Shoulder abduction WNL 4/5  Elbow flexion WNL 4/5  Elbow extension WNL 4/5  (Blank rows = not tested)  HAND FUNCTION: Grip strength: Right: 49.6 lbs; Left: 19.6 lbs  COORDINATION: 9 Hole Peg test: Right: 19 sec; Left: 32 sec  SENSATION: WFL  EDEMA: none reported or observed  MUSCLE TONE: WFL  COGNITION: Overall cognitive status: Impaired and reports more difficulty with memory since hospitalization  VISION: Subjective report: no changes Baseline vision: Wears glasses all the time and progressive lenses Visual history: none  VISION ASSESSMENT: WFL  PERCEPTION: WFL  PRAXIS: Impaired: Motor planning  OBSERVATIONS: Pt ambulates with use of RW. No loss of balance. The pt appears well kept and has glasses donned.                                                                                                                          TODAY'S TREATMENT :    - Therapeutic activities completed for duration as noted below including:  Patient completed typing/fine motor coordination  exercises using [email or web address] to promote rehabilitation of affected extremity.  Patient completed 5 Type Toss games scoring 1216 points/21 words at  easy difficulty and hard difficult lesson type.  Patient completed 3 Type Toss games scoring 1216 points/16 words at  hard difficulty and hard difficult lesson type. Patient completed 2 Type-A-Balloon games scoring 1336 points/60 balloons popped at hard difficulty and 80 balloons at easy difficulty all letters lesson type. Patient encouraged to practice games at home and will retest typing abilities during a future visit.   Objective measures assessed as noted in Goals section to determine progression towards goals. Therapist reviewed goals with patient and updated patient progression. Remaining functional limitations identified as ability to use  L hand to open bottle tops and typing.  PATIENT EDUCATION: Education details: Functional activities  Person educated: Patient Education method: Explanation and Verbal cues Education comprehension: verbalized understanding, verbal cues required, and needs further education  HOME EXERCISE PROGRAM: 03/07/2024: putty and coordination HEPs  GOALS:  SHORT TERM GOALS: MET 2/2   LONG TERM GOALS: Target date: 04/14/24   Patient will demonstrate updated L UE HEP with visual handouts only for proper execution. Baseline:  Goal status: IN Progress  2.  Patient will demo improved FM coordination as evidenced by completing nine-hole peg with use of L in 25 seconds or less. Baseline: Right: 19 sec; Left: 32 sec 03/28/2024: 21 seconds Goal status: MET  3.  Patient will demonstrate at least 30 lbs L grip strength as needed to open jars and other containers. Baseline: Right: 49.6 lbs; Left: 19.6 lbs 03/28/2024: 32.6 lbs Goal status: MET  4.  Patient will report at least two-point increase in average PSFS score or at least three-point increase in a single activity score indicating functionally significant improvement  given minimum detectable change.  Baseline: 5.0 total score (See above for individual activity scores) 03/28/2024: 6.3 total score Goal status: MET  ASSESSMENT:  CLINICAL IMPRESSION: Patient demonstrating good progression towards goals though remains limited by processing speed compared to PLOF. Her referral to ST has been delayed due to staffing. Will monitor need to update OT POC to meet the pt's needs. She is on track for current goals.  PERFORMANCE DEFICITS: in functional skills including ADLs, IADLs, coordination, strength, Fine motor control, endurance, decreased knowledge of use of DME, vision, and UE functional use and cognitive skills including memory.   IMPAIRMENTS: are limiting patient from ADLs, IADLs, work, leisure, and social participation.   CO-MORBIDITIES: may have co-morbidities  that affects occupational performance. Patient will benefit from skilled OT to address above impairments and improve overall function.  REHAB POTENTIAL: Good  PLAN:  OT FREQUENCY: 2x/week  OT DURATION: 6 weeks  PLANNED INTERVENTIONS: 97168 OT Re-evaluation, 97535 self care/ADL training, 02889 therapeutic exercise, 97530 therapeutic activity, 97112 neuromuscular re-education, 97035 ultrasound, 97018 paraffin, 02960 fluidotherapy, 97010 moist heat, 97034 contrast bath, functional mobility training, coping strategies training, patient/family education, and DME and/or AE instructions  RECOMMENDED OTHER SERVICES: ST referral  CONSULTED AND AGREED WITH PLAN OF CARE: Patient  PLAN FOR NEXT SESSION: update for ST referral; progress toward LTGs   Jocelyn CHRISTELLA Bottom, OT 03/28/2024, 3:45 PM

## 2024-03-29 ENCOUNTER — Encounter (HOSPITAL_BASED_OUTPATIENT_CLINIC_OR_DEPARTMENT_OTHER): Payer: Self-pay

## 2024-03-30 ENCOUNTER — Ambulatory Visit: Admitting: Occupational Therapy

## 2024-03-30 ENCOUNTER — Ambulatory Visit: Admitting: Physical Therapy

## 2024-04-03 ENCOUNTER — Encounter: Payer: Self-pay | Admitting: Family Medicine

## 2024-04-04 ENCOUNTER — Ambulatory Visit: Admitting: Occupational Therapy

## 2024-04-04 ENCOUNTER — Ambulatory Visit: Admitting: Physical Therapy

## 2024-04-04 VITALS — BP 113/82 | HR 81

## 2024-04-04 DIAGNOSIS — M6281 Muscle weakness (generalized): Secondary | ICD-10-CM | POA: Diagnosis not present

## 2024-04-04 DIAGNOSIS — R41842 Visuospatial deficit: Secondary | ICD-10-CM

## 2024-04-04 DIAGNOSIS — R2689 Other abnormalities of gait and mobility: Secondary | ICD-10-CM

## 2024-04-04 DIAGNOSIS — R278 Other lack of coordination: Secondary | ICD-10-CM

## 2024-04-04 DIAGNOSIS — R41844 Frontal lobe and executive function deficit: Secondary | ICD-10-CM

## 2024-04-04 DIAGNOSIS — R29818 Other symptoms and signs involving the nervous system: Secondary | ICD-10-CM

## 2024-04-04 NOTE — Therapy (Signed)
 OUTPATIENT OCCUPATIONAL THERAPY NEURO TREATMENT  Patient Name: Jackie Reed MRN: [id number] DOB:13-Mar-1966, 58 y.o., female Today's Date: 04/04/2024  PCP: Waylan Almarie SAUNDERS, MD  REFERRING PROVIDER: Pegge Toribio PARAS, PA-C  END OF SESSION:  OT End of Session - 04/04/24 1452     Visit Number 6    Number of Visits 13    Date for OT Re-Evaluation 04/14/24    Authorization Type [employer]    OT Start Time 1450    OT Stop Time 1530    OT Time Calculation (min) 40 min    Activity Tolerance Patient tolerated treatment well    Behavior During Therapy [facility] for tasks assessed/performed         Past Medical History:  Diagnosis Date   Adenomatous colon polyp 2017   Dyspnea    occasional   E-coli UTI 08/2019   Hepatitis 05/2020   Hepatitis B core antibody positive   Hypertension    Migraine    Plantar fibromatosis    PONV (postoperative nausea and vomiting)    Pre-diabetes    Sarcoidosis    Stroke (HCC)    TB lung, latent    Thyroid  nodule 01/29/2023   seen on PET scan   Vitamin B12 deficiency 06/2019   Vitamin D  deficiency 06/2019   Past Surgical History:  Procedure Laterality Date   ABDOMINAL HYSTERECTOMY     FINE NEEDLE ASPIRATION  02/19/2023   Procedure: FINE NEEDLE ASPIRATION (FNA) LINEAR;  Surgeon: Brenna Adine CROME, DO;  Location: [facility];  Service: Cardiopulmonary;;   FOOT SURGERY Bilateral 1985   MASS EXCISION Right 05/19/2018   Procedure: EXCISION PALMAR NODULES RIGHT HAND;  Surgeon: Murrell Drivers, MD;  Location: [facility];  Service: Orthopedics;  Laterality: Right;   PARTIAL HYSTERECTOMY  1997   VIDEO BRONCHOSCOPY WITH ENDOBRONCHIAL ULTRASOUND Bilateral 02/19/2023   Procedure: VIDEO BRONCHOSCOPY WITH ENDOBRONCHIAL ULTRASOUND;  Surgeon: Brenna Adine CROME, DO;  Location: [facility];  Service: Cardiopulmonary;  Laterality: Bilateral;   Patient Active Problem List   Diagnosis Date Noted   Hyperlipidemia 03/20/2024   Cognitive and neurobehavioral  dysfunction 02/04/2024   Encephalopathy 01/28/2024   Encephalopathy, hypertensive 01/20/2024   Medication monitoring encounter 10/01/2023   TB lung, latent 08/27/2023   Sarcoidosis 04/22/2023   Adenopathy 02/09/2023   History of COVID-19 10/03/2020   Hepatitis B carrier (HCC) 07/03/2020   Mass of left hand 10/02/2019   Elevated blood pressure reading in office with diagnosis of hypertension 09/11/2019   Pre-diabetes 06/14/2019   Low back pain without sciatica 03/07/2019   Back muscle spasm 03/07/2019   History of recent fall 12/31/2018   Sprain of right ankle 11/18/2018   Class 2 severe obesity due to excess calories with serious comorbidity and body mass index (BMI) of 35.0 to 35.9 in adult [facility]) 10/31/2018   Essential hypertension, benign 11/08/2015   Tendon calcification 11/08/2015   Adjustment disorder with mixed anxiety and depressed mood 11/08/2015   Insomnia 11/08/2015    ONSET DATE: 02/04/2024 (Date of referral)  REFERRING DIAG: G93.40 (ICD-10-CM) - Encephalopathy, unspecified   THERAPY DIAG:  Other lack of coordination  Other symptoms and signs involving the nervous system  Frontal lobe and executive function deficit  Visuospatial deficit  Rationale for Evaluation and Treatment: Rehabilitation  SUBJECTIVE:   SUBJECTIVE STATEMENT: Pt reports she still gets a little help sometimes with ADLS ie) when L arm feels like it needs help to get her shirt down but majority of time she does it herself.  She has been helping a little bit in the kitchen ie) helping with dishes, getting stuff in/out of the fridge, juice etc.  Pt reports ongoing difficulty with typing and opening tops of bottles etc using L hand.   Pt accompanied by: self  PERTINENT HISTORY: PMH: HTN, Sarcoidosis, hepatitis, migraines, LBP, Latent TB (positive QuantiFERON test)   PRECAUTIONS: Fall  WEIGHT BEARING RESTRICTIONS: No  PAIN:  Are you having pain? No  FALLS: Has patient fallen in last 6  months? No  LIVING ENVIRONMENT: Lives with: lives with their spouse Lives in: House/apartment Stairs: Yes: Internal: splint level steps; on right going up and no rail to go downstairs and External: 2 steps; none Has following equipment at home: Vannie - 2 wheeled  PLOF: Independent; driving; phone scheduler with computer work; reading; puzzles (crosswords)  PATIENT GOALS: return to work/PLOF  OBJECTIVE:  Note: Objective measures were completed at Evaluation unless otherwise noted.  HAND DOMINANCE: Left  ADLs: Overall ADLs: mod I (increased time)  Eating: unsure - hasn't tried  IADLs: Shopping: max A 8/11 - still goes to the store - rides the cart. Light housekeeping: dependent - 8/11 - folding clothes ans washing dishes Meal Prep: mod I for light meal prep;  Community mobility: dependent - 8/11 - RW Medication management: independent Financial management: independent Handwriting: Increased time and 50% of prior level per pt report  MOBILITY STATUS: Independent  ACTIVITY TOLERANCE: Activity tolerance: fair  FUNCTIONAL OUTCOME MEASURES: PSFS: 5.0 total score 03/28/2024: 6.3 total score   UPPER EXTREMITY ROM:    BUE: WNL  UPPER EXTREMITY MMT:     MMT Right (eval) Left (eval)  Shoulder flexion WNL 4/5  Shoulder abduction WNL 4/5  Elbow flexion WNL 4/5  Elbow extension WNL 4/5  (Blank rows = not tested)  HAND FUNCTION: Grip strength: Right: 49.6 lbs; Left: 19.6 lbs  COORDINATION: 9 Hole Peg test: Right: 19 sec; Left: 32 sec  SENSATION: WFL  EDEMA: none reported or observed  MUSCLE TONE: WFL  COGNITION: Overall cognitive status: Impaired and reports more difficulty with memory since hospitalization  VISION: Subjective report: no changes Baseline vision: Wears glasses all the time and progressive lenses Visual history: none  VISION ASSESSMENT: WFL  PERCEPTION: WFL  PRAXIS: Impaired: Motor planning  OBSERVATIONS: Pt ambulates with use of RW.  No loss of balance. The pt appears well kept and has glasses donned.                                                                                                                          TODAY'S TREATMENT :    - Self Care education and training completed for duration as noted below including: Reviewed ADLs/IADLS with pt getting occasional assist with ADLs and encouraged to maximize her own independence and progress participation in home management tasks which she is beginning to help with. Objective measures reviewed to assess progression towards goals and assist with goal revision ideas ie) wants to be able  to help more with prepping meals  ie. cutting veggies AND needs to be able to multi-task for work where she schedules MRI, CT and ultrasound, mammograms etc ie) takes phone calls, navigates computer screens after logging in -moving between 5-6 windows open, has to know where to naviagate to to find prep for exam, ie) job is detailed. - Therapeutic activities completed for duration as noted below including: Pt engaged in memory activities including Drawing from [employer] and [employer]. Pt able to recall 1-2 pictures at a time but without specific order to memory and then struggled to remember which images she had left to draw.  In the following image  - her drawings are on the left and she drew the first 6 from memory 1-2 at a time with OT covering the page between task and initially drew $ instead of #.  The bottom 6 pictures, she copied from the page left open in her visual field.  Pt noted to struggle with drawing hexagon and triangle with decreased attention to details except for the ^^^^^ image ie) arrow wrong direction, incorrect size of dots, unequal rays on sun etc.   Pt also participated in a structured tabletop activity using a memory card game aimed at addressing cognitive functioning, specifically short-term memory, attention, and visual scanning skills. Client provided only 5  matches (10 cards) to begin with and demonstrated ability to sustain attention for task with accuracy in locating matching pairs: 3/5.  Moderate difficulty with recall and delayed retrieval of previously seen cards.  Required verbal and visual to complete the task with small set of cards provided to her for use at home. PATIENT EDUCATION: Education details: memory activities  Person educated: Patient Education method: Leisure centre manager cues Education comprehension: verbalized understanding, verbal cues required, and needs further education  HOME EXERCISE PROGRAM: 03/07/2024: putty and coordination HEPs  GOALS:  SHORT TERM GOALS: MET 2/2   LONG TERM GOALS: Target date: 04/14/24   Patient will demonstrate updated L UE HEP with visual handouts only for proper execution. Baseline:  Goal status: IN Progress  2.  Patient will demo improved FM coordination as evidenced by completing nine-hole peg with use of L in 25 seconds or less. Baseline: Right: 19 sec; Left: 32 sec 03/28/2024: 21 seconds Goal status: MET  3.  Patient will demonstrate at least 30 lbs L grip strength as needed to open jars and other containers. Baseline: Right: 49.6 lbs; Left: 19.6 lbs 03/28/2024: 32.6 lbs Goal status: MET  4.  Patient will report at least two-point increase in average PSFS score or at least three-point increase in a single activity score indicating functionally significant improvement given minimum detectable change. Baseline: 5.0 total score (See above for individual activity scores) 03/28/2024: 6.3 total score Goal status: MET  ASSESSMENT:  CLINICAL IMPRESSION: Patient demonstrating good progression towards goals though remains limited by memory, visual perceptual and processing speed compared to PLOF. Pt will see ST at another clinic at the end of the week due to staffing issues at this clinic. Will need to update OT POC to meet the pt's needs as she is on track for current goals but needs to  multi task better, navigate computer programs and remember or have compensatory strategies for locating important follow through instructions at work.  PERFORMANCE DEFICITS: in functional skills including ADLs, IADLs, coordination, strength, Fine motor control, endurance, decreased knowledge of use of DME, vision, and UE functional use and cognitive skills including memory.   IMPAIRMENTS: are  limiting patient from ADLs, IADLs, work, leisure, and social participation.   CO-MORBIDITIES: may have co-morbidities  that affects occupational performance. Patient will benefit from skilled OT to address above impairments and improve overall function.  REHAB POTENTIAL: Good  PLAN:  OT FREQUENCY: 2x/week  OT DURATION: 6 weeks  PLANNED INTERVENTIONS: 97168 OT Re-evaluation, 97535 self care/ADL training, 02889 therapeutic exercise, 97530 therapeutic activity, 97112 neuromuscular re-education, 97035 ultrasound, 97018 paraffin, 02960 fluidotherapy, 97010 moist heat, 97034 contrast bath, functional mobility training, coping strategies training, patient/family education, and DME and/or AE instructions  RECOMMENDED OTHER SERVICES: ST referral  CONSULTED AND AGREED WITH PLAN OF CARE: Patient  PLAN FOR NEXT SESSION: progress toward LTGs - needs new goals  IADLS - cooking/meal prep Visual perceptual/motor activities Needs to multi task better, navigate computer programs and remember (or have compensatory strategies) for locating important follow up instructions at work.    Clarita LITTIE Pride, OT 04/04/2024, 8:00 PM

## 2024-04-04 NOTE — Therapy (Addendum)
 OUTPATIENT PHYSICAL THERAPY NEURO TREATMENT   Patient Name: Jackie Reed MRN: [id number] DOB:[birth year], 58 y.o., female Today's Date: 04/04/2024   PCP: Waylan Almarie SAUNDERS, MD REFERRING PROVIDER: Pegge Toribio PARAS, PA-C  END OF SESSION:  PT End of Session - 04/04/24 1533     Visit Number 6    Number of Visits 13   Plus eval   Date for PT Re-Evaluation 04/20/24    Authorization Type [employer]    PT Start Time 1532    PT Stop Time 1612    PT Time Calculation (min) 40 min    Equipment Utilized During Treatment Gait belt    Activity Tolerance Patient tolerated treatment well    Behavior During Therapy [facility] for tasks assessed/performed            Past Medical History:  Diagnosis Date   Adenomatous colon polyp 2017   Dyspnea    occasional   E-coli UTI 08/2019   Hepatitis 05/2020   Hepatitis B core antibody positive   Hypertension    Migraine    Plantar fibromatosis    PONV (postoperative nausea and vomiting)    Pre-diabetes    Sarcoidosis    Stroke (HCC)    TB lung, latent    Thyroid  nodule 01/29/2023   seen on PET scan   Vitamin B12 deficiency 06/2019   Vitamin D  deficiency 06/2019   Past Surgical History:  Procedure Laterality Date   ABDOMINAL HYSTERECTOMY     FINE NEEDLE ASPIRATION  02/19/2023   Procedure: FINE NEEDLE ASPIRATION (FNA) LINEAR;  Surgeon: Brenna Adine CROME, DO;  Location: [facility];  Service: Cardiopulmonary;;   FOOT SURGERY Bilateral 1985   MASS EXCISION Right 05/19/2018   Procedure: EXCISION PALMAR NODULES RIGHT HAND;  Surgeon: Murrell Drivers, MD;  Location: [facility];  Service: Orthopedics;  Laterality: Right;   PARTIAL HYSTERECTOMY  1997   VIDEO BRONCHOSCOPY WITH ENDOBRONCHIAL ULTRASOUND Bilateral 02/19/2023   Procedure: VIDEO BRONCHOSCOPY WITH ENDOBRONCHIAL ULTRASOUND;  Surgeon: Brenna Adine CROME, DO;  Location: [facility];  Service: Cardiopulmonary;  Laterality: Bilateral;   Patient Active Problem List    Diagnosis Date Noted   Hyperlipidemia 03/20/2024   Cognitive and neurobehavioral dysfunction 02/04/2024   Encephalopathy 01/28/2024   Encephalopathy, hypertensive 01/20/2024   Medication monitoring encounter 10/01/2023   TB lung, latent 08/27/2023   Sarcoidosis 04/22/2023   Adenopathy 02/09/2023   History of COVID-19 10/03/2020   Hepatitis B carrier (HCC) 07/03/2020   Mass of left hand 10/02/2019   Elevated blood pressure reading in office with diagnosis of hypertension 09/11/2019   Pre-diabetes 06/14/2019   Low back pain without sciatica 03/07/2019   Back muscle spasm 03/07/2019   History of recent fall 12/31/2018   Sprain of right ankle 11/18/2018   Class 2 severe obesity due to excess calories with serious comorbidity and body mass index (BMI) of 35.0 to 35.9 in adult [facility]) 10/31/2018   Essential hypertension, benign 11/08/2015   Tendon calcification 11/08/2015   Adjustment disorder with mixed anxiety and depressed mood 11/08/2015   Insomnia 11/08/2015    ONSET DATE: 02/03/2024 (referral)   REFERRING DIAG: G93.40 (ICD-10-CM) - Encephalopathy, unspecified  THERAPY DIAG:  Muscle weakness (generalized)  Other lack of coordination  Other abnormalities of gait and mobility  Rationale for Evaluation and Treatment: Rehabilitation  SUBJECTIVE:  SUBJECTIVE STATEMENT: Pt presents w/RW. States she has not been able to get a cane yet but will. No falls. HEP is going well.    Pt accompanied by: self - husband Darin (waiting in lobby)  PERTINENT HISTORY: HTN, Sarcoidosis, hepatitis, migraines, LBP, Latent TB (positive QuantiFERON test)  PAIN:  Are you having pain? No  PRECAUTIONS: Fall  RED FLAGS: None   WEIGHT BEARING RESTRICTIONS: No  FALLS: Has patient fallen in last 6 months?  No  LIVING ENVIRONMENT: Lives with: lives with their spouse Lives in: House/apartment Stairs: Yes: Internal: 2 flights steps; one set has a rail on the right side going up, other flight has no rail and External: 2 steps; none Has following equipment at home: Walker - 2 wheeled and Grab bars  PLOF: Independent  PATIENT GOALS: To get the strength back on my L side so I can get back to being independent   OBJECTIVE:  Note: Objective measures were completed at Evaluation unless otherwise noted.  DIAGNOSTIC FINDINGS:  CT of head from 01/20/24 IMPRESSION: 1. No acute intracranial abnormality. 2. Generalized atrophy and findings of chronic microvascular disease.  MRI of brain from 01/20/24 IMPRESSION: 1. No acute intracranial abnormality. 2. Findings of chronic small vessel ischemia and volume loss.  MRI of C-Spine from 01/22/24 IMPRESSION: 1. Small central to left paracentral disc protrusions at C2-3 through C5-6 as above. Secondary minor cord flattening without cord signal changes or significant spinal stenosis. Findings could contribute to left upper extremity symptoms. 2. Small right paracentral disc protrusion at C6-7 without significant stenosis.   COGNITION: Overall cognitive status: Difficulty to assess due to: no family present   SENSATION: Pt denies numbness/tingling in all extremities    POSTURE: rounded shoulders, increased thoracic kyphosis, and weight shift left  LOWER EXTREMITY ROM:     Active  Right Eval Left Eval  Hip flexion    Hip extension    Hip abduction    Hip adduction    Hip internal rotation    Hip external rotation    Knee flexion    Knee extension    Ankle dorsiflexion    Ankle plantarflexion    Ankle inversion    Ankle eversion     (Blank rows = not tested)  LOWER EXTREMITY MMT:  Tested in seated position - inconsistent muscle contraction on LLE  MMT Right Eval Left Eval  Hip flexion 5 4+  Hip extension    Hip abduction 5 4-   Hip adduction 5 4-  Hip internal rotation    Hip external rotation    Knee flexion 5 4+  Knee extension 5 4  Ankle dorsiflexion 5 4  Ankle plantarflexion    Ankle inversion    Ankle eversion    (Blank rows = not tested)  BED MOBILITY: Pt reports independence with this, sleeping in standard bed height   TRANSFERS: Sit to stand: Modified independence  Assistive device utilized: Environmental consultant - 2 wheeled     Stand to sit: Modified independence  Assistive device utilized: Environmental consultant - 2 wheeled      RAMP:  Not tested  CURB:  Not tested  STAIRS: Findings: Level of Assistance: SBA, Stair Negotiation Technique: Step to Pattern with Bilateral Rails, Number of Stairs: 4, Height of Stairs: 6   , and Comments: Pt required min cues to remember proper BLE sequence to descent. No LOB noted, good eccentric control w/descent  GAIT: Gait pattern: step through pattern, decreased step length- Left, decreased stride length, decreased hip/knee  flexion- Left, decreased ankle dorsiflexion- Left, lateral hip instability, wide BOS, and poor foot clearance- Left Distance walked: Various clinic distances  Assistive device utilized: Walker - 2 wheeled Level of assistance: Modified independence Comments: Pt w/symmetrical alternating pattern and equal stance time bilaterally    FUNCTIONAL TESTS:   [facility] PT Assessment - 04/04/24 1551       Transfers   Five time sit to stand comments  23.22s w/BUE support      Standardized Balance Assessment   Standardized Balance Assessment 10 meter walk test    10 Meter Walk 0.52 m/s w/RW   74m over 19.25s w/RW           PATIENT SURVEYS:  ABC scale: The Activities-Specific Balance Confidence (ABC) Scale 0% 10 20 30  40 50 60 70 80 90 100% No confidence<->completely confident  "How confident are you that you will not lose your balance or become unsteady when you . . .   Date tested 03/02/24 04/04/24  Walk around the house 20% 80%  2. Walk up or down stairs 10% 70%   3. Bend over and pick up a slipper from in front of a closet floor 10% 70%   4. Reach for a small can off a shelf at eye level 50% 80%  5. Stand on tip toes and reach for something above your head 10% 30%  6. Stand on a chair and reach for something 0% 30%  7. Sweep the floor 10% 50%  8. Walk outside the house to a car parked in the driveway 50% 90%  9. Get into or out of a car 60% 90%  10. Walk across a parking lot to the mall 10% 80%  11. Walk up or down a ramp 20% 70%  12. Walk in a crowded mall where people rapidly walk past you 10% 70%  13. Are bumped into by people as you walk through the mall 50% 10%  14. Step onto or off of an escalator while you are holding onto the railing 20% 90%  15. Step onto or off an escalator while holding onto parcels such that you cannot hold onto the railing 10% 80%  16. Walk outside on icy sidewalks 10% 10%  Total: #/16 350/16 = 21.875% 1000/16 = 62.5%     VITALS  Vitals:   04/04/24 1536  BP: 113/82  Pulse: 81                                                                                                                             TREATMENT:    Self-care/Ther Act  Assessed vitals (see above) and WNL  Assessed ABC scale (see pt surveys above) for STG assessment    [facility] PT Assessment - 04/04/24 1551       Transfers   Five time sit to stand comments  23.22s w/BUE support      Standardized Balance Assessment   Standardized Balance Assessment 10 meter walk test  10 Meter Walk 0.52 m/s w/RW   63m over 19.25s w/RW        STAIRS:  Level of Assistance: SBA  Stair Negotiation Technique: Alternating Pattern  with Single Rail on Right  Number of Stairs: 16   Height of Stairs: 6  Comments: No instability noted. Increased guarding w/descent but good eccentric control noted   NMR  Beside ballet bar, fwd 6 hurdle navigation w/ no UE support, leading w/LLE x2 reps and RLE x2 reps. Pt performed w/step-to pattern and no LOB. No  difficulty reported with task   6 Blaze pods on random reach setting for improved single leg stability, LE coordination and hip flexor strength.  Performed on 2 minute intervals with 2 minute seated rest periods.  Pt requires SBA guarding. Round 1:  3 pods placed on first step and 3 pods placed on second step setup, no UE support.  40 hits. Round 2:  same setup while standing on airex.  48 hits. Notable errors/deficits:  Pt required max encouraging cues to perform as she reported activity was too hard. However, pt performed well w/no assistance required. RPE of 7/10 following activity.   PATIENT EDUCATION: Education details: Continue HEP, continued encouragement to Brewing technologist and practice with it at home, goal results  Person educated: Patient Education method: Explanation, Demonstration, and Verbal cues Education comprehension: verbalized understanding and needs further education  HOME EXERCISE PROGRAM: Access Code: [email or web address] URL: [email or web address] Date: 03/09/2024 Prepared by: Daved Bull  Exercises - Sit to Stand with Armchair  - 1 x daily - 7 x weekly - 2 sets - 10 reps - Standing March with Counter Support  - 1 x daily - 4 x weekly - 2 sets - 10 reps - Standing Quarter Turn with Counter Support  - 1 x daily - 4 x weekly - 1-2 sets - 10 reps - Standing Hip Abduction with Counter Support  - 1 x daily - 4 x weekly - 2 sets - 10 reps  GOALS: Goals reviewed with patient? Yes  SHORT TERM GOALS: Target date: 03/30/2024    Pt will improve score on ABC scale to >/= 35% for improved confidence in mobility and reduced fall risk  Baseline: 21.875% (7/10); 62.5% (8/12) Goal status: MET  2.  Pt will ascend/descend 16 steps w/single rail on R w/step-through pattern and SBA for improved functional strength and safety in home environment  Baseline: SBA w/bilateral rails; SBA w/single rail on R  Goal status: MET  3.  Pt will improve gait velocity to at least 0.5  m/s w/LRAD for improved gait efficiency and reduced fall risk   Baseline: 0.41 m/s w/RW; 0.52 m/s w/RW  Goal status: MET   4.  Pt will improve 5 x STS to less than or equal to 22 seconds w/no UE support to demonstrate improved functional strength and transfer efficiency.   Baseline: 24.16s w/BUE support; 23.22s w/BUE  Goal status: NOT MET     LONG TERM GOALS: Target date: 04/13/2024   Pt will ascend/descend 16 steps w/no handrails w/SBA for improved BLE strength and safety in home environment.  Baseline:  Goal status: INITIAL  2.  Pt will improve 5 x STS to less than or equal to 18 seconds w/no UE support to demonstrate improved functional strength and transfer efficiency.   Baseline: 24.16s w/BUE support  Goal status: INITIAL  3.  Pt will improve score on ABC Scale to >/= 50% for improved functional mobility and reduced fall risk  Baseline: 21.875% (  7/10); 62.5% (8/12) Goal status: MET  4.  Pt will improve gait velocity to at least 0.6 m/s w/LRAD for improved gait efficiency and reduced fall risk   Baseline: 0.41 m/s w/RW Goal status: INITIAL   ASSESSMENT:  CLINICAL IMPRESSION: Emphasis of skilled PT session on STG assessment, single leg stability and LE coordination. Pt has met 3/4 STGs, w/significant improvements noted on ABC Scale and gait speed w/RW, indicative of improved confidence w/mobility and functional strength. Pt able to navigate 16 steps w/alternating pattern and R handrail only at SBA level as well. Pt did improve her time on 5x STS today, but not quite to goal level. Pt continues to use RW until she can obtain [facility] and is limited the most by fear-avoidance behavior. However, pt responds well to use of encouraging verbal cues. Continue POC.   OBJECTIVE IMPAIRMENTS: Abnormal gait, decreased activity tolerance, decreased balance, decreased knowledge of condition, decreased mobility, difficulty walking, decreased strength, decreased safety awareness, impaired  perceived functional ability, and improper body mechanics.   ACTIVITY LIMITATIONS: carrying, lifting, squatting, stairs, and locomotion level  PARTICIPATION LIMITATIONS: meal prep, cleaning, laundry, driving, shopping, community activity, occupation, and yard work  PERSONAL FACTORS: Behavior pattern, Fitness, and 1 comorbidity: Encephalopathy vs Adjustment Disorder are also affecting patient's functional outcome.   REHAB POTENTIAL: Good  CLINICAL DECISION MAKING: Evolving/moderate complexity  EVALUATION COMPLEXITY: Moderate  PLAN:  PT FREQUENCY: 2x/week  PT DURATION: 6 weeks  PLANNED INTERVENTIONS: 02835- PT Re-evaluation, 97750- Physical Performance Testing, 97110-Therapeutic exercises, 97530- Therapeutic activity, [id number]- Neuromuscular re-education, 97535- Self Care, 02859- Manual therapy, [id number]- Gait training, [phone]- Aquatic Therapy, [phone]- Electrical stimulation (manual), [phone] (1-2 muscles), [id number] (3+ muscles)- Dry Needling, Patient/Family education, Balance training, Stair training, Joint mobilization, Spinal mobilization, Vestibular training, and DME instructions  PLAN FOR NEXT SESSION: Sit to stands. Expand HEP for LLE NMR, staggered STS/squats, endurance and confidence w/mobility. SciFit for endurance. STAIRS. Eccentric control, step clearance w/LLE and gait w/reduced reliance on BUEs, L step ups forward and laterally, progress hurdles to no UE support, modified SLS, did she get cane?    Mirabel Ahlgren E Chanin Frumkin, PT, DPT 04/04/2024, 4:21 PM

## 2024-04-06 ENCOUNTER — Telehealth: Payer: Self-pay | Admitting: Physical Therapy

## 2024-04-06 ENCOUNTER — Ambulatory Visit: Admitting: Occupational Therapy

## 2024-04-06 ENCOUNTER — Ambulatory Visit (HOSPITAL_BASED_OUTPATIENT_CLINIC_OR_DEPARTMENT_OTHER): Payer: Self-pay | Admitting: Pulmonary Disease

## 2024-04-06 ENCOUNTER — Ambulatory Visit: Admitting: Physical Therapy

## 2024-04-06 NOTE — Telephone Encounter (Signed)
 Are you okay with me recommending Mucinex  OTC BID?

## 2024-04-06 NOTE — Telephone Encounter (Signed)
 Likely viral illness. Symptoms typically improve within 7-10 days. Recommend Mucinex  DM or Delsym OTC for cough/mucus relief. COVID test and notify if positive. UC if symptoms worsen. Notify if symptoms fail to improve

## 2024-04-06 NOTE — Telephone Encounter (Signed)
 Called and spoke to pt regarding no-show to PT appointment. Pt reports she has a cold and is not feeling well. Reminded pt to try and call ahead to cancel appointments if she is not feeling well and reminded pt of next appointment date and time. Pt verbalized understanding.   Jimi Giza E Janiece Scovill, PT, DPT

## 2024-04-06 NOTE — Telephone Encounter (Signed)
 Pt.notified

## 2024-04-06 NOTE — Telephone Encounter (Signed)
 FYI Only or Action Required?: Action required by provider: Requesting medication for her symptoms or other treatment advice.  Patient was last seen on 07/28/2023 by Kassie Acquanetta Bradley, MD.  Called Nurse Triage reporting Cough.  Symptoms began yesterday.  Interventions attempted: Maintenance inhaler.  Symptoms are: gradually worsening.  Triage Disposition: See HCP Within 4 Hours (Or PCP Triage)  Patient/caregiver understands and will follow disposition?: No appointment, advised to go to urgent care        Copied from CRM #[id number]. Topic: Clinical - Red Word Triage >> Apr 06, 2024  1:13 PM Nathanel DEL wrote: Red Word that prompted transfer to Nurse Triage: chest tightness/ pt has a cold.  Pt has productive cough. Is there anything OTC or should she get Rx from Dr Kassie since she has lung disease         Reason for Disposition  [1] MILD difficulty breathing (e.g., minimal/no SOB at rest, SOB with walking, pulse < 100) AND [2] still present when not coughing  Answer Assessment - Initial Assessment Questions Patient requesting treatment advice from Dr. Kassie. Patient advised she should go to urgent care to have her symptoms evaluated. Patient states she will if her symptoms worsen but would still like to hear back from the office. Please advise.          1. ONSET: When did the cough begin?      1 day ago  2. SEVERITY: How bad is the cough today?      Moderate  3. SPUTUM: Describe the color of your sputum (e.g., none, dry cough; clear, white, yellow, green)     No 4. HEMOPTYSIS: Are you coughing up any blood? If Yes, ask: How much? (e.g., flecks, streaks, tablespoons, etc.)     No 5. DIFFICULTY BREATHING: Are you having difficulty breathing? If Yes, ask: How bad is it? (e.g., mild, moderate, severe)      Mild 6. FEVER: Do you have a fever? If Yes, ask: What is your temperature, how was it measured, and when did it start?     No 7. CARDIAC HISTORY:  Do you have any history of heart disease? (e.g., heart attack, congestive heart failure)      Hypertension  8. LUNG HISTORY: Do you have any history of lung disease?  (e.g., pulmonary embolus, asthma, emphysema)     Yes 9. PE RISK FACTORS: Do you have a history of blood clots? (or: recent major surgery, recent prolonged travel, bedridden)     No 10. OTHER SYMPTOMS: Do you have any other symptoms? (e.g., runny nose, wheezing, chest pain)       No  Protocols used: Cough - Acute Non-Productive-A-AH

## 2024-04-07 ENCOUNTER — Ambulatory Visit: Admitting: Speech Pathology

## 2024-04-10 ENCOUNTER — Ambulatory Visit: Admitting: Physical Therapy

## 2024-04-10 VITALS — BP 116/93 | HR 97

## 2024-04-10 DIAGNOSIS — M6281 Muscle weakness (generalized): Secondary | ICD-10-CM

## 2024-04-10 DIAGNOSIS — R2689 Other abnormalities of gait and mobility: Secondary | ICD-10-CM

## 2024-04-10 DIAGNOSIS — R278 Other lack of coordination: Secondary | ICD-10-CM

## 2024-04-10 NOTE — Therapy (Signed)
 OUTPATIENT PHYSICAL THERAPY NEURO TREATMENT   Patient Name: Jackie Reed MRN: [id number] DOB:[birth year], 58 y.o., female Today's Date: 04/10/2024   PCP: Waylan Almarie SAUNDERS, MD REFERRING PROVIDER: Pegge Toribio PARAS, PA-C  END OF SESSION:  PT End of Session - 04/10/24 1512     Visit Number 7    Number of Visits 13   Plus eval   Date for PT Re-Evaluation 04/20/24    Authorization Type [employer]    PT Start Time 1511    PT Stop Time 1551    PT Time Calculation (min) 40 min    Equipment Utilized During Treatment Gait belt    Activity Tolerance Patient tolerated treatment well    Behavior During Therapy [facility] for tasks assessed/performed             Past Medical History:  Diagnosis Date   Adenomatous colon polyp 2017   Dyspnea    occasional   E-coli UTI 08/2019   Hepatitis 05/2020   Hepatitis B core antibody positive   Hypertension    Migraine    Plantar fibromatosis    PONV (postoperative nausea and vomiting)    Pre-diabetes    Sarcoidosis    Stroke (HCC)    TB lung, latent    Thyroid  nodule 01/29/2023   seen on PET scan   Vitamin B12 deficiency 06/2019   Vitamin D  deficiency 06/2019   Past Surgical History:  Procedure Laterality Date   ABDOMINAL HYSTERECTOMY     FINE NEEDLE ASPIRATION  02/19/2023   Procedure: FINE NEEDLE ASPIRATION (FNA) LINEAR;  Surgeon: Brenna Adine CROME, DO;  Location: [facility];  Service: Cardiopulmonary;;   FOOT SURGERY Bilateral 1985   MASS EXCISION Right 05/19/2018   Procedure: EXCISION PALMAR NODULES RIGHT HAND;  Surgeon: Murrell Drivers, MD;  Location: [facility];  Service: Orthopedics;  Laterality: Right;   PARTIAL HYSTERECTOMY  1997   VIDEO BRONCHOSCOPY WITH ENDOBRONCHIAL ULTRASOUND Bilateral 02/19/2023   Procedure: VIDEO BRONCHOSCOPY WITH ENDOBRONCHIAL ULTRASOUND;  Surgeon: Brenna Adine CROME, DO;  Location: [facility];  Service: Cardiopulmonary;  Laterality: Bilateral;   Patient Active Problem List    Diagnosis Date Noted   Hyperlipidemia 03/20/2024   Cognitive and neurobehavioral dysfunction 02/04/2024   Encephalopathy 01/28/2024   Encephalopathy, hypertensive 01/20/2024   Medication monitoring encounter 10/01/2023   TB lung, latent 08/27/2023   Sarcoidosis 04/22/2023   Adenopathy 02/09/2023   History of COVID-19 10/03/2020   Hepatitis B carrier (HCC) 07/03/2020   Mass of left hand 10/02/2019   Elevated blood pressure reading in office with diagnosis of hypertension 09/11/2019   Pre-diabetes 06/14/2019   Low back pain without sciatica 03/07/2019   Back muscle spasm 03/07/2019   History of recent fall 12/31/2018   Sprain of right ankle 11/18/2018   Class 2 severe obesity due to excess calories with serious comorbidity and body mass index (BMI) of 35.0 to 35.9 in adult [facility]) 10/31/2018   Essential hypertension, benign 11/08/2015   Tendon calcification 11/08/2015   Adjustment disorder with mixed anxiety and depressed mood 11/08/2015   Insomnia 11/08/2015    ONSET DATE: 02/03/2024 (referral)   REFERRING DIAG: G93.40 (ICD-10-CM) - Encephalopathy, unspecified  THERAPY DIAG:  Muscle weakness (generalized)  Other abnormalities of gait and mobility  Other lack of coordination  Rationale for Evaluation and Treatment: Rehabilitation  SUBJECTIVE:  SUBJECTIVE STATEMENT: Pt presents w/RW. States she wants to work on walking without an AD. Has not been using it much at home and has tried some shorter distances without it. No falls. Feels ready to DC this week.    Pt accompanied by: self - husband Darin (waiting in lobby)  PERTINENT HISTORY: HTN, Sarcoidosis, hepatitis, migraines, LBP, Latent TB (positive QuantiFERON test)  PAIN:  Are you having pain? No  PRECAUTIONS: Fall  RED  FLAGS: None   WEIGHT BEARING RESTRICTIONS: No  FALLS: Has patient fallen in last 6 months? No  LIVING ENVIRONMENT: Lives with: lives with their spouse Lives in: House/apartment Stairs: Yes: Internal: 2 flights steps; one set has a rail on the right side going up, other flight has no rail and External: 2 steps; none Has following equipment at home: Walker - 2 wheeled and Grab bars  PLOF: Independent  PATIENT GOALS: To get the strength back on my L side so I can get back to being independent   OBJECTIVE:  Note: Objective measures were completed at Evaluation unless otherwise noted.  DIAGNOSTIC FINDINGS:  CT of head from 01/20/24 IMPRESSION: 1. No acute intracranial abnormality. 2. Generalized atrophy and findings of chronic microvascular disease.  MRI of brain from 01/20/24 IMPRESSION: 1. No acute intracranial abnormality. 2. Findings of chronic small vessel ischemia and volume loss.  MRI of C-Spine from 01/22/24 IMPRESSION: 1. Small central to left paracentral disc protrusions at C2-3 through C5-6 as above. Secondary minor cord flattening without cord signal changes or significant spinal stenosis. Findings could contribute to left upper extremity symptoms. 2. Small right paracentral disc protrusion at C6-7 without significant stenosis.   COGNITION: Overall cognitive status: Difficulty to assess due to: no family present   SENSATION: Pt denies numbness/tingling in all extremities    POSTURE: rounded shoulders, increased thoracic kyphosis, and weight shift left  LOWER EXTREMITY ROM:     Active  Right Eval Left Eval  Hip flexion    Hip extension    Hip abduction    Hip adduction    Hip internal rotation    Hip external rotation    Knee flexion    Knee extension    Ankle dorsiflexion    Ankle plantarflexion    Ankle inversion    Ankle eversion     (Blank rows = not tested)  LOWER EXTREMITY MMT:  Tested in seated position - inconsistent muscle  contraction on LLE  MMT Right Eval Left Eval  Hip flexion 5 4+  Hip extension    Hip abduction 5 4-  Hip adduction 5 4-  Hip internal rotation    Hip external rotation    Knee flexion 5 4+  Knee extension 5 4  Ankle dorsiflexion 5 4  Ankle plantarflexion    Ankle inversion    Ankle eversion    (Blank rows = not tested)  BED MOBILITY: Pt reports independence with this, sleeping in standard bed height   TRANSFERS: Sit to stand: Modified independence  Assistive device utilized: Environmental consultant - 2 wheeled     Stand to sit: Modified independence  Assistive device utilized: Environmental consultant - 2 wheeled      RAMP:  Not tested  CURB:  Not tested  STAIRS: Findings: Level of Assistance: SBA, Stair Negotiation Technique: Step to Pattern with Bilateral Rails, Number of Stairs: 4, Height of Stairs: 6   , and Comments: Pt required min cues to remember proper BLE sequence to descent. No LOB noted, good eccentric control w/descent  GAIT: Gait pattern: step through pattern, decreased step length- Left, decreased stride length, decreased hip/knee flexion- Left, decreased ankle dorsiflexion- Left, lateral hip instability, wide BOS, and poor foot clearance- Left Distance walked: Various clinic distances  Assistive device utilized: Walker - 2 wheeled Level of assistance: Modified independence Comments: Pt w/symmetrical alternating pattern and equal stance time bilaterally    FUNCTIONAL TESTS:       PATIENT SURVEYS:  ABC scale: The Activities-Specific Balance Confidence (ABC) Scale 0% 10 20 30  40 50 60 70 80 90 100% No confidence<->completely confident  "How confident are you that you will not lose your balance or become unsteady when you . . .   Date tested 03/02/24 04/04/24  Walk around the house 20% 80%  2. Walk up or down stairs 10% 70%  3. Bend over and pick up a slipper from in front of a closet floor 10% 70%   4. Reach for a small can off a shelf at eye level 50% 80%  5. Stand on tip toes and  reach for something above your head 10% 30%  6. Stand on a chair and reach for something 0% 30%  7. Sweep the floor 10% 50%  8. Walk outside the house to a car parked in the driveway 50% 90%  9. Get into or out of a car 60% 90%  10. Walk across a parking lot to the mall 10% 80%  11. Walk up or down a ramp 20% 70%  12. Walk in a crowded mall where people rapidly walk past you 10% 70%  13. Are bumped into by people as you walk through the mall 50% 10%  14. Step onto or off of an escalator while you are holding onto the railing 20% 90%  15. Step onto or off an escalator while holding onto parcels such that you cannot hold onto the railing 10% 80%  16. Walk outside on icy sidewalks 10% 10%  Total: #/16 350/16 = 21.875% 1000/16 = 62.5%     VITALS  Vitals:   04/10/24 1518  BP: (!) 116/93  Pulse: 97                                                                                                                          TREATMENT:    Self-care/Ther Act  Assessed vitals (see above) and WNL    Gait Training   Gait pattern: step through pattern, decreased step length- Left, decreased stride length, decreased hip/knee flexion- Left, decreased ankle dorsiflexion- Left, lateral hip instability, lateral lean- Left, and wide BOS Distance walked: >350' indoors plus various clinic distances  Assistive device utilized: None Level of assistance: Modified independence Comments: Practiced ambulating around clinic while maintaining conversation w/therapist to work on functional endurance and confidence w/mobility. Pt demonstrates asymmetry with stance time and compensated trendelenburg on L side, but no LOB noted    Gait pattern: step through pattern, decreased arm swing- Left, decreased stance time- Left, decreased stride length,  lateral hip instability, and lateral lean- Left Distance walked: >350' outside  Assistive device utilized: None Level of assistance: Modified independence Comments:  Practiced ambulating outside on sidewalk for improved independence w/community ambulation and improved endurance. Pt required several standing rest breaks but no LOB noted   Gait pattern: step through pattern, decreased arm swing- Left, decreased stride length, and lateral hip instability Distance walked: >350' outside on grass and up/down curb  Assistive device utilized: Quad cane small base Level of assistance: Modified independence Comments: Practiced walking in grass and up/down 4 curb outside w/cane. Pt reported increased fatigue in grass but no LOB noted. Min cues for proper sequencing of cane w/curb navigation which pt performed well.   RAMP:  Level of Assistance: Modified independence Assistive device utilized: Quad cane small base Ramp Comments: Increased difficulty w/descent due to decreased eccentric control of LLE. Pt performed x2 reps and improved on second rep   CURB:  Level of Assistance: Modified independence Assistive device utilized: Quad cane small base Curb Comments: Navigated 6 curb x2 reps and pt able to sequence cane independently. Decreased eccentric control on first rep that did improve on rep 2.     PATIENT EDUCATION: Education details: Continue HEP, plan to DC next session.  Person educated: Patient Education method: Explanation, Demonstration, and Verbal cues Education comprehension: verbalized understanding and needs further education  HOME EXERCISE PROGRAM: Access Code: [email or web address] URL: [email or web address] Date: 03/09/2024 Prepared by: Daved Bull  Exercises - Sit to Stand with Armchair  - 1 x daily - 7 x weekly - 2 sets - 10 reps - Standing March with Counter Support  - 1 x daily - 4 x weekly - 2 sets - 10 reps - Standing Quarter Turn with Counter Support  - 1 x daily - 4 x weekly - 1-2 sets - 10 reps - Standing Hip Abduction with Counter Support  - 1 x daily - 4 x weekly - 2 sets - 10 reps  GOALS: Goals reviewed with patient?  Yes  SHORT TERM GOALS: Target date: 03/30/2024    Pt will improve score on ABC scale to >/= 35% for improved confidence in mobility and reduced fall risk  Baseline: 21.875% (7/10); 62.5% (8/12) Goal status: MET  2.  Pt will ascend/descend 16 steps w/single rail on R w/step-through pattern and SBA for improved functional strength and safety in home environment  Baseline: SBA w/bilateral rails; SBA w/single rail on R  Goal status: MET  3.  Pt will improve gait velocity to at least 0.5 m/s w/LRAD for improved gait efficiency and reduced fall risk   Baseline: 0.41 m/s w/RW; 0.52 m/s w/RW  Goal status: MET   4.  Pt will improve 5 x STS to less than or equal to 22 seconds w/no UE support to demonstrate improved functional strength and transfer efficiency.   Baseline: 24.16s w/BUE support; 23.22s w/BUE  Goal status: NOT MET     LONG TERM GOALS: Target date: 04/13/2024   Pt will ascend/descend 16 steps w/no handrails w/SBA for improved BLE strength and safety in home environment.  Baseline:  Goal status: INITIAL  2.  Pt will improve 5 x STS to less than or equal to 18 seconds w/no UE support to demonstrate improved functional strength and transfer efficiency.   Baseline: 24.16s w/BUE support  Goal status: INITIAL  3.  Pt will improve score on ABC Scale to >/= 50% for improved functional mobility and reduced fall risk  Baseline: 21.875% (7/10); 62.5% (8/12) Goal status:  MET  4.  Pt will improve gait velocity to at least 0.6 m/s w/LRAD for improved gait efficiency and reduced fall risk   Baseline: 0.41 m/s w/RW Goal status: INITIAL   ASSESSMENT:  CLINICAL IMPRESSION: Emphasis of skilled PT session on gait training on level and unlevel surfaces with and without AD for improved endurance and independence w/community distances. Pt reports she has been relying less on her RW at home and w/some short community distances. Pt able to ambulate >300' both indoors and outdoors this date  without AD at mod I level, requiring occasional standing rest breaks due to fatigue. Encouraged pt to continue practicing gait at home without AD for improved endurance. Pt able to navigate 6 curb and ramp w/SBQC at mod I level as well. Pt reports she is ready to DC this week and will plan for DC next session.  Continue POC.   OBJECTIVE IMPAIRMENTS: Abnormal gait, decreased activity tolerance, decreased balance, decreased knowledge of condition, decreased mobility, difficulty walking, decreased strength, decreased safety awareness, impaired perceived functional ability, and improper body mechanics.   ACTIVITY LIMITATIONS: carrying, lifting, squatting, stairs, and locomotion level  PARTICIPATION LIMITATIONS: meal prep, cleaning, laundry, driving, shopping, community activity, occupation, and yard work  PERSONAL FACTORS: Behavior pattern, Fitness, and 1 comorbidity: Encephalopathy vs Adjustment Disorder are also affecting patient's functional outcome.   REHAB POTENTIAL: Good  CLINICAL DECISION MAKING: Evolving/moderate complexity  EVALUATION COMPLEXITY: Moderate  PLAN:  PT FREQUENCY: 2x/week  PT DURATION: 6 weeks  PLANNED INTERVENTIONS: 02835- PT Re-evaluation, 97750- Physical Performance Testing, 97110-Therapeutic exercises, 97530- Therapeutic activity, [id number]- Neuromuscular re-education, 97535- Self Care, [id number]- Manual therapy, [id number]- Gait training, [phone]- Aquatic Therapy, [phone]- Electrical stimulation (manual), [phone] (1-2 muscles), [id number] (3+ muscles)- Dry Needling, Patient/Family education, Balance training, Stair training, Joint mobilization, Spinal mobilization, Vestibular training, and DME instructions  PLAN FOR NEXT SESSION: Sit to stands. Goals and DC.   Expand HEP for LLE NMR, staggered STS/squats, endurance and confidence w/mobility. SciFit for endurance. STAIRS. Eccentric control, step clearance w/LLE and gait w/reduced reliance on BUEs, L step ups forward and laterally, progress  hurdles to no UE support, modified SLS, did she get cane?    Pattie Flaharty E Maryclaire Stoecker, PT, DPT 04/10/2024, 3:56 PM

## 2024-04-11 ENCOUNTER — Ambulatory Visit: Admitting: Occupational Therapy

## 2024-04-11 ENCOUNTER — Ambulatory Visit: Admitting: Physical Therapy

## 2024-04-11 DIAGNOSIS — R41842 Visuospatial deficit: Secondary | ICD-10-CM

## 2024-04-11 DIAGNOSIS — R278 Other lack of coordination: Secondary | ICD-10-CM

## 2024-04-11 DIAGNOSIS — R29818 Other symptoms and signs involving the nervous system: Secondary | ICD-10-CM

## 2024-04-11 DIAGNOSIS — R29898 Other symptoms and signs involving the musculoskeletal system: Secondary | ICD-10-CM

## 2024-04-11 DIAGNOSIS — M6281 Muscle weakness (generalized): Secondary | ICD-10-CM

## 2024-04-11 NOTE — Therapy (Unsigned)
 OUTPATIENT OCCUPATIONAL THERAPY NEURO TREATMENT  Patient Name: Jackie Reed MRN: [id number] DOB:[birth year], 58 y.o., female Today's Date: 04/11/2024  PCP: Waylan Almarie SAUNDERS, MD  REFERRING PROVIDER: Pegge Toribio PARAS, PA-C  END OF SESSION:  OT End of Session - 04/11/24 1532     Visit Number 7    Number of Visits 13    Date for OT Re-Evaluation 04/14/24    Authorization Type [employer]    OT Start Time 1448    OT Stop Time 1532    OT Time Calculation (min) 44 min    Activity Tolerance Patient tolerated treatment well    Behavior During Therapy [facility] for tasks assessed/performed         Past Medical History:  Diagnosis Date   Adenomatous colon polyp 2017   Dyspnea    occasional   E-coli UTI 08/2019   Hepatitis 05/2020   Hepatitis B core antibody positive   Hypertension    Migraine    Plantar fibromatosis    PONV (postoperative nausea and vomiting)    Pre-diabetes    Sarcoidosis    Stroke (HCC)    TB lung, latent    Thyroid  nodule 01/29/2023   seen on PET scan   Vitamin B12 deficiency 06/2019   Vitamin D  deficiency 06/2019   Past Surgical History:  Procedure Laterality Date   ABDOMINAL HYSTERECTOMY     FINE NEEDLE ASPIRATION  02/19/2023   Procedure: FINE NEEDLE ASPIRATION (FNA) LINEAR;  Surgeon: Brenna Adine CROME, DO;  Location: [facility];  Service: Cardiopulmonary;;   FOOT SURGERY Bilateral 1985   MASS EXCISION Right 05/19/2018   Procedure: EXCISION PALMAR NODULES RIGHT HAND;  Surgeon: Murrell Drivers, MD;  Location: [facility];  Service: Orthopedics;  Laterality: Right;   PARTIAL HYSTERECTOMY  1997   VIDEO BRONCHOSCOPY WITH ENDOBRONCHIAL ULTRASOUND Bilateral 02/19/2023   Procedure: VIDEO BRONCHOSCOPY WITH ENDOBRONCHIAL ULTRASOUND;  Surgeon: Brenna Adine CROME, DO;  Location: [facility];  Service: Cardiopulmonary;  Laterality: Bilateral;   Patient Active Problem List   Diagnosis Date Noted   Hyperlipidemia 03/20/2024   Cognitive and neurobehavioral  dysfunction 02/04/2024   Encephalopathy 01/28/2024   Encephalopathy, hypertensive 01/20/2024   Medication monitoring encounter 10/01/2023   TB lung, latent 08/27/2023   Sarcoidosis 04/22/2023   Adenopathy 02/09/2023   History of COVID-19 10/03/2020   Hepatitis B carrier (HCC) 07/03/2020   Mass of left hand 10/02/2019   Elevated blood pressure reading in office with diagnosis of hypertension 09/11/2019   Pre-diabetes 06/14/2019   Low back pain without sciatica 03/07/2019   Back muscle spasm 03/07/2019   History of recent fall 12/31/2018   Sprain of right ankle 11/18/2018   Class 2 severe obesity due to excess calories with serious comorbidity and body mass index (BMI) of 35.0 to 35.9 in adult [facility]) 10/31/2018   Essential hypertension, benign 11/08/2015   Tendon calcification 11/08/2015   Adjustment disorder with mixed anxiety and depressed mood 11/08/2015   Insomnia 11/08/2015    ONSET DATE: 02/04/2024 (Date of referral)  REFERRING DIAG: G93.40 (ICD-10-CM) - Encephalopathy, unspecified   THERAPY DIAG:  Muscle weakness (generalized)  Other lack of coordination  Other symptoms and signs involving the nervous system  Visuospatial deficit  Other symptoms and signs involving the musculoskeletal system  Rationale for Evaluation and Treatment: Rehabilitation  SUBJECTIVE:   SUBJECTIVE STATEMENT: She sees ST for eval on Monday next week.   Pt accompanied by: self  PERTINENT HISTORY: PMH: HTN, Sarcoidosis, hepatitis, migraines, LBP, Latent TB (positive  QuantiFERON test)   PRECAUTIONS: Fall  WEIGHT BEARING RESTRICTIONS: No  PAIN:  Are you having pain? No  FALLS: Has patient fallen in last 6 months? No  LIVING ENVIRONMENT: Lives with: lives with their spouse Lives in: House/apartment Stairs: Yes: Internal: splint level steps; on right going up and no rail to go downstairs and External: 2 steps; none Has following equipment at home: Vannie - 2 wheeled  PLOF:  Independent; driving; phone scheduler with computer work; reading; puzzles (crosswords)  PATIENT GOALS: return to work/PLOF  OBJECTIVE:  Note: Objective measures were completed at Evaluation unless otherwise noted.  HAND DOMINANCE: Left  ADLs: Overall ADLs: mod I (increased time)  Eating: unsure - hasn't tried  IADLs: Shopping: max A 8/11 - still goes to the store - rides the cart. Light housekeeping: dependent - 8/11 - folding clothes ans washing dishes Meal Prep: mod I for light meal prep;  Community mobility: dependent - 8/11 - RW Medication management: independent Financial management: independent Handwriting: Increased time and 50% of prior level per pt report  MOBILITY STATUS: Independent  ACTIVITY TOLERANCE: Activity tolerance: fair  FUNCTIONAL OUTCOME MEASURES: PSFS: 5.0 total score 03/28/2024: 6.3 total score   UPPER EXTREMITY ROM:    BUE: WNL  UPPER EXTREMITY MMT:     MMT Right (eval) Left (eval)  Shoulder flexion WNL 4/5  Shoulder abduction WNL 4/5  Elbow flexion WNL 4/5  Elbow extension WNL 4/5  (Blank rows = not tested)  HAND FUNCTION: Grip strength: Right: 49.6 lbs; Left: 19.6 lbs  COORDINATION: 9 Hole Peg test: Right: 19 sec; Left: 32 sec  SENSATION: WFL  EDEMA: none reported or observed  MUSCLE TONE: WFL  COGNITION: Overall cognitive status: Impaired and reports more difficulty with memory since hospitalization  VISION: Subjective report: no changes Baseline vision: Wears glasses all the time and progressive lenses Visual history: none  VISION ASSESSMENT: WFL  PERCEPTION: WFL  PRAXIS: Impaired: Motor planning  OBSERVATIONS: Pt ambulates with use of RW. No loss of balance. The pt appears well kept and has glasses donned.                                                                                                                          TODAY'S TREATMENT :    - Therapeutic activities completed for duration as noted below  including: Patient engaged in color Sudoku activity using left to address attention, problem-solving, deductive reasoning, visual scanning, tolerance, and task persistence, and spatial organization. Required occasional redirection to check for duplicates and moderate cueing for proper play.   Pt assembled 7 Pieces of [place]verness for attention, problem-solving, deductive reasoning, visual scanning, tolerance, task persistence, LUE coordination and visual spatial relations.  OT educated pt on use of Impulse or other similar app for cognitive challenges to complete at home.  PATIENT EDUCATION: Education details: functional activities  Person educated: Patient Education method: Explanation and Verbal cues Education comprehension: verbalized understanding, verbal cues required, and needs further education  HOME EXERCISE PROGRAM: 03/07/2024: putty and coordination HEPs  GOALS:  SHORT TERM GOALS: MET 2/2   LONG TERM GOALS: Target date: 04/14/24   Patient will demonstrate updated L UE HEP with visual handouts only for proper execution. Baseline:  Goal status: IN Progress  2.  Patient will demo improved FM coordination as evidenced by completing nine-hole peg with use of L in 25 seconds or less. Baseline: Right: 19 sec; Left: 32 sec 03/28/2024: 21 seconds Goal status: MET  3.  Patient will demonstrate at least 30 lbs L grip strength as needed to open jars and other containers. Baseline: Right: 49.6 lbs; Left: 19.6 lbs 03/28/2024: 32.6 lbs Goal status: MET  4.  Patient will report at least two-point increase in average PSFS score or at least three-point increase in a single activity score indicating functionally significant improvement given minimum detectable change. Baseline: 5.0 total score (See above for individual activity scores) 03/28/2024: 6.3 total score Goal status: MET  ASSESSMENT:  CLINICAL IMPRESSION: Patient demonstrating good improvements with cognitive tasks with increased  time and repetition this visit. Recommend review of remaining goals and determine extension vs d/c at next visit. PERFORMANCE DEFICITS: in functional skills including ADLs, IADLs, coordination, strength, Fine motor control, endurance, decreased knowledge of use of DME, vision, and UE functional use and cognitive skills including memory.   IMPAIRMENTS: are limiting patient from ADLs, IADLs, work, leisure, and social participation.   CO-MORBIDITIES: may have co-morbidities  that affects occupational performance. Patient will benefit from skilled OT to address above impairments and improve overall function.  REHAB POTENTIAL: Good  PLAN:  OT FREQUENCY: 2x/week  OT DURATION: 6 weeks  PLANNED INTERVENTIONS: 97168 OT Re-evaluation, 97535 self care/ADL training, 02889 therapeutic exercise, 97530 therapeutic activity, 97112 neuromuscular re-education, 97035 ultrasound, 97018 paraffin, 02960 fluidotherapy, 97010 moist heat, 97034 contrast bath, functional mobility training, coping strategies training, patient/family education, and DME and/or AE instructions  RECOMMENDED OTHER SERVICES: ST referral  CONSULTED AND AGREED WITH PLAN OF CARE: Patient  PLAN FOR NEXT SESSION: progress toward LTGs - needs new goals VS d/c  Needs to multi task better, navigate computer programs and remember (or have compensatory strategies) for locating important follow up instructions at work.    Jocelyn CHRISTELLA Bottom, OT 04/11/2024, 3:41 PM

## 2024-04-13 ENCOUNTER — Ambulatory Visit: Admitting: Occupational Therapy

## 2024-04-13 ENCOUNTER — Ambulatory Visit: Admitting: Physical Therapy

## 2024-04-13 ENCOUNTER — Telehealth: Payer: Self-pay | Admitting: Neurology

## 2024-04-13 DIAGNOSIS — R278 Other lack of coordination: Secondary | ICD-10-CM

## 2024-04-13 DIAGNOSIS — R2681 Unsteadiness on feet: Secondary | ICD-10-CM

## 2024-04-13 DIAGNOSIS — M6281 Muscle weakness (generalized): Secondary | ICD-10-CM

## 2024-04-13 DIAGNOSIS — R29818 Other symptoms and signs involving the nervous system: Secondary | ICD-10-CM

## 2024-04-13 DIAGNOSIS — R41842 Visuospatial deficit: Secondary | ICD-10-CM

## 2024-04-13 DIAGNOSIS — R29898 Other symptoms and signs involving the musculoskeletal system: Secondary | ICD-10-CM

## 2024-04-13 NOTE — Therapy (Signed)
 OUTPATIENT PHYSICAL THERAPY NEURO TREATMENT - DISCHARGE SUMMARY   Patient Name: Jackie Reed MRN: [id number] DOB:1965-10-17, 58 y.o., female Today's Date: 04/13/2024   PCP: Waylan Almarie SAUNDERS, MD REFERRING PROVIDER: Pegge Toribio PARAS, PA-C  PHYSICAL THERAPY DISCHARGE SUMMARY  Visits from Start of Care: 8  Current functional level related to goals / functional outcomes: Mod I w/all ADLs and ambulates short community distances w/o AD, supplements w/RW when fatigued    Remaining deficits: Decreased activity tolerance, L hemiparesis, low fall risk    Education / Equipment: HEP   Patient agrees to discharge. Patient goals were partially met. Patient is being discharged due to being pleased with the current functional level.   END OF SESSION:  PT End of Session - 04/13/24 1533     Visit Number 8    Number of Visits 13   Plus eval   Date for PT Re-Evaluation 04/20/24    Authorization Type [employer]    PT Start Time 1532    PT Stop Time 1548   DC   PT Time Calculation (min) 16 min    Equipment Utilized During Treatment --    Activity Tolerance Patient tolerated treatment well    Behavior During Therapy [facility] for tasks assessed/performed              Past Medical History:  Diagnosis Date   Adenomatous colon polyp 2017   Dyspnea    occasional   E-coli UTI 08/2019   Hepatitis 05/2020   Hepatitis B core antibody positive   Hypertension    Migraine    Plantar fibromatosis    PONV (postoperative nausea and vomiting)    Pre-diabetes    Sarcoidosis    Stroke (HCC)    TB lung, latent    Thyroid  nodule 01/29/2023   seen on PET scan   Vitamin B12 deficiency 06/2019   Vitamin D  deficiency 06/2019   Past Surgical History:  Procedure Laterality Date   ABDOMINAL HYSTERECTOMY     FINE NEEDLE ASPIRATION  02/19/2023   Procedure: FINE NEEDLE ASPIRATION (FNA) LINEAR;  Surgeon: Brenna Adine CROME, DO;  Location: [facility];  Service: Cardiopulmonary;;   FOOT  SURGERY Bilateral 1985   MASS EXCISION Right 05/19/2018   Procedure: EXCISION PALMAR NODULES RIGHT HAND;  Surgeon: Murrell Drivers, MD;  Location: [facility];  Service: Orthopedics;  Laterality: Right;   PARTIAL HYSTERECTOMY  1997   VIDEO BRONCHOSCOPY WITH ENDOBRONCHIAL ULTRASOUND Bilateral 02/19/2023   Procedure: VIDEO BRONCHOSCOPY WITH ENDOBRONCHIAL ULTRASOUND;  Surgeon: Brenna Adine CROME, DO;  Location: [facility];  Service: Cardiopulmonary;  Laterality: Bilateral;   Patient Active Problem List   Diagnosis Date Noted   Hyperlipidemia 03/20/2024   Cognitive and neurobehavioral dysfunction 02/04/2024   Encephalopathy 01/28/2024   Encephalopathy, hypertensive 01/20/2024   Medication monitoring encounter 10/01/2023   TB lung, latent 08/27/2023   Sarcoidosis 04/22/2023   Adenopathy 02/09/2023   History of COVID-19 10/03/2020   Hepatitis B carrier (HCC) 07/03/2020   Mass of left hand 10/02/2019   Elevated blood pressure reading in office with diagnosis of hypertension 09/11/2019   Pre-diabetes 06/14/2019   Low back pain without sciatica 03/07/2019   Back muscle spasm 03/07/2019   History of recent fall 12/31/2018   Sprain of right ankle 11/18/2018   Class 2 severe obesity due to excess calories with serious comorbidity and body mass index (BMI) of 35.0 to 35.9 in adult [facility]) 10/31/2018   Essential hypertension, benign 11/08/2015   Tendon calcification 11/08/2015  Adjustment disorder with mixed anxiety and depressed mood 11/08/2015   Insomnia 11/08/2015    ONSET DATE: 02/03/2024 (referral)   REFERRING DIAG: G93.40 (ICD-10-CM) - Encephalopathy, unspecified  THERAPY DIAG:  Muscle weakness (generalized)  Other lack of coordination  Unsteadiness on feet  Rationale for Evaluation and Treatment: Rehabilitation  SUBJECTIVE:                                                                                                                                                                                              SUBJECTIVE STATEMENT: Pt presents without AD, handoff w/OT. States she feels really good, is ready to DC from PT today. Denies acute changes.    Pt accompanied by: self - husband Darin (waiting in lobby)  PERTINENT HISTORY: HTN, Sarcoidosis, hepatitis, migraines, LBP, Latent TB (positive QuantiFERON test)  PAIN:  Are you having pain? No  PRECAUTIONS: Fall  RED FLAGS: None   WEIGHT BEARING RESTRICTIONS: No  FALLS: Has patient fallen in last 6 months? No  LIVING ENVIRONMENT: Lives with: lives with their spouse Lives in: House/apartment Stairs: Yes: Internal: 2 flights steps; one set has a rail on the right side going up, other flight has no rail and External: 2 steps; none Has following equipment at home: Walker - 2 wheeled and Grab bars  PLOF: Independent  PATIENT GOALS: To get the strength back on my L side so I can get back to being independent   OBJECTIVE:  Note: Objective measures were completed at Evaluation unless otherwise noted.  DIAGNOSTIC FINDINGS:  CT of head from 01/20/24 IMPRESSION: 1. No acute intracranial abnormality. 2. Generalized atrophy and findings of chronic microvascular disease.  MRI of brain from 01/20/24 IMPRESSION: 1. No acute intracranial abnormality. 2. Findings of chronic small vessel ischemia and volume loss.  MRI of C-Spine from 01/22/24 IMPRESSION: 1. Small central to left paracentral disc protrusions at C2-3 through C5-6 as above. Secondary minor cord flattening without cord signal changes or significant spinal stenosis. Findings could contribute to left upper extremity symptoms. 2. Small right paracentral disc protrusion at C6-7 without significant stenosis.   COGNITION: Overall cognitive status: Difficulty to assess due to: no family present   SENSATION: Pt denies numbness/tingling in all extremities    POSTURE: rounded shoulders, increased thoracic kyphosis, and weight shift  left  LOWER EXTREMITY ROM:     Active  Right Eval Left Eval  Hip flexion    Hip extension    Hip abduction    Hip adduction    Hip internal rotation    Hip external rotation    Knee flexion    Knee extension  Ankle dorsiflexion    Ankle plantarflexion    Ankle inversion    Ankle eversion     (Blank rows = not tested)  LOWER EXTREMITY MMT:  Tested in seated position - inconsistent muscle contraction on LLE  MMT Right Eval Left Eval  Hip flexion 5 4+  Hip extension    Hip abduction 5 4-  Hip adduction 5 4-  Hip internal rotation    Hip external rotation    Knee flexion 5 4+  Knee extension 5 4  Ankle dorsiflexion 5 4  Ankle plantarflexion    Ankle inversion    Ankle eversion    (Blank rows = not tested)  BED MOBILITY: Pt reports independence with this, sleeping in standard bed height   TRANSFERS: Sit to stand: Modified independence  Assistive device utilized: Environmental consultant - 2 wheeled     Stand to sit: Modified independence  Assistive device utilized: Environmental consultant - 2 wheeled      RAMP:  Not tested  CURB:  Not tested  STAIRS: Findings: Level of Assistance: SBA, Stair Negotiation Technique: Step to Pattern with Bilateral Rails, Number of Stairs: 4, Height of Stairs: 6   , and Comments: Pt required min cues to remember proper BLE sequence to descent. No LOB noted, good eccentric control w/descent  GAIT: Gait pattern: step through pattern, decreased step length- Left, decreased stride length, decreased hip/knee flexion- Left, decreased ankle dorsiflexion- Left, lateral hip instability, wide BOS, and poor foot clearance- Left Distance walked: Various clinic distances  Assistive device utilized: Walker - 2 wheeled Level of assistance: Modified independence Comments: Pt w/symmetrical alternating pattern and equal stance time bilaterally    FUNCTIONAL TESTS:   [facility] PT Assessment - 04/13/24 1538       Transfers   Five time sit to stand comments  20.17s w/BUE support       Standardized Balance Assessment   10 Meter Walk 0.53 m/s no AD   46m over 19.02s            PATIENT SURVEYS:  ABC scale: The Activities-Specific Balance Confidence (ABC) Scale 0% 10 20 30  40 50 60 70 80 90 100% No confidence<->completely confident  "How confident are you that you will not lose your balance or become unsteady when you . . .   Date tested 03/02/24 04/04/24  Walk around the house 20% 80%  2. Walk up or down stairs 10% 70%  3. Bend over and pick up a slipper from in front of a closet floor 10% 70%   4. Reach for a small can off a shelf at eye level 50% 80%  5. Stand on tip toes and reach for something above your head 10% 30%  6. Stand on a chair and reach for something 0% 30%  7. Sweep the floor 10% 50%  8. Walk outside the house to a car parked in the driveway 50% 90%  9. Get into or out of a car 60% 90%  10. Walk across a parking lot to the mall 10% 80%  11. Walk up or down a ramp 20% 70%  12. Walk in a crowded mall where people rapidly walk past you 10% 70%  13. Are bumped into by people as you walk through the mall 50% 10%  14. Step onto or off of an escalator while you are holding onto the railing 20% 90%  15. Step onto or off an escalator while holding onto parcels such that you cannot hold onto the railing 10%  80%  16. Walk outside on icy sidewalks 10% 10%  Total: #/16 350/16 = 21.875% 1000/16 = 62.5%     VITALS  There were no vitals filed for this visit.                                                                                         TREATMENT:    Ther Act   [facility] PT Assessment - 04/13/24 1538       Transfers   Five time sit to stand comments  20.17s w/BUE support      Standardized Balance Assessment   10 Meter Walk 0.53 m/s no AD   7m over 19.02s        Discussed importance of walking program and encouraged pt to gradually add time to weekly walking to build up endurance and confidence w/o AD. Pt verbalized understanding.  Educated  pt on how to obtain referral to PT in future if mobility needs change.  STAIRS:  Level of Assistance: Modified independence  Stair Negotiation Technique: Step to Pattern Alternating Pattern  with No Rails  Number of Stairs: 16   Height of Stairs: 6  Comments: Pt practiced mix of step-to and step-through pattern leading w/both LLE and RLE. No instability noted, but pt initially very guarded but by step 9, pt much more fluid w/movement and reported feeling confident.     PATIENT EDUCATION: Education details: Goal results, see above Person educated: Patient Education method: Medical illustrator Education comprehension: verbalized understanding and needs further education  HOME EXERCISE PROGRAM: Access Code: [email or web address] URL: [email or web address] Date: 03/09/2024 Prepared by: Daved Bull  Exercises - Sit to Stand with Armchair  - 1 x daily - 7 x weekly - 2 sets - 10 reps - Standing March with Counter Support  - 1 x daily - 4 x weekly - 2 sets - 10 reps - Standing Quarter Turn with Counter Support  - 1 x daily - 4 x weekly - 1-2 sets - 10 reps - Standing Hip Abduction with Counter Support  - 1 x daily - 4 x weekly - 2 sets - 10 reps  You Can Walk For A Certain Length Of Time Each Day                          Walk 15 minutes 1-2 times per day.             Increase 3-5  minutes every 7 days              Work up to 30 minutes (1-2 times per day).               Example:                         Day 1-2           4-5 minutes     3 times per day                         Day 7-8  10-12 minutes 2-3 times per day                         Day 13-14       20-22 minutes 1-2 times per day   GOALS: Goals reviewed with patient? Yes  SHORT TERM GOALS: Target date: 03/30/2024    Pt will improve score on ABC scale to >/= 35% for improved confidence in mobility and reduced fall risk  Baseline: 21.875% (7/10); 62.5% (8/12) Goal status: MET  2.  Pt will  ascend/descend 16 steps w/single rail on R w/step-through pattern and SBA for improved functional strength and safety in home environment  Baseline: SBA w/bilateral rails; SBA w/single rail on R  Goal status: MET  3.  Pt will improve gait velocity to at least 0.5 m/s w/LRAD for improved gait efficiency and reduced fall risk   Baseline: 0.41 m/s w/RW; 0.52 m/s w/RW  Goal status: MET   4.  Pt will improve 5 x STS to less than or equal to 22 seconds w/no UE support to demonstrate improved functional strength and transfer efficiency.   Baseline: 24.16s w/BUE support; 23.22s w/BUE  Goal status: NOT MET     LONG TERM GOALS: Target date: 04/13/2024   Pt will ascend/descend 16 steps w/no handrails w/SBA for improved BLE strength and safety in home environment.  Baseline:  Goal status: MET  2.  Pt will improve 5 x STS to less than or equal to 18 seconds w/no UE support to demonstrate improved functional strength and transfer efficiency.   Baseline: 24.16s w/BUE support; 20.17s w/BUE support (8/21) Goal status: PARTIALLY MET   3.  Pt will improve score on ABC Scale to >/= 50% for improved functional mobility and reduced fall risk  Baseline: 21.875% (7/10); 62.5% (8/12) Goal status: MET  4.  Pt will improve gait velocity to at least 0.6 m/s w/LRAD for improved gait efficiency and reduced fall risk   Baseline: 0.41 m/s w/RW; 0.53 m/s no AD  Goal status: PARTIALLY MET    ASSESSMENT:  CLINICAL IMPRESSION: Emphasis of skilled PT session on assessing LTGs and DC from PT. Pt has met 2 of 4 LTGs and was able to ambulate into and out of clinic without AD for first time. Pt has improved her score on ABC scale to 62%, up from 21% on eval and was able to navigate 16 steps without use of handrails at mod I level today. Pt has improved her gait speed without AD and her 5x STS, but did not quite meet her goal level. However, consider goals partially met. Pt verbalized readiness to DC from PT today.    OBJECTIVE IMPAIRMENTS: Abnormal gait, decreased activity tolerance, decreased balance, decreased knowledge of condition, decreased mobility, difficulty walking, decreased strength, decreased safety awareness, impaired perceived functional ability, and improper body mechanics.   ACTIVITY LIMITATIONS: carrying, lifting, squatting, stairs, and locomotion level  PARTICIPATION LIMITATIONS: meal prep, cleaning, laundry, driving, shopping, community activity, occupation, and yard work  PERSONAL FACTORS: Behavior pattern, Fitness, and 1 comorbidity: Encephalopathy vs Adjustment Disorder are also affecting patient's functional outcome.   REHAB POTENTIAL: Good  CLINICAL DECISION MAKING: Evolving/moderate complexity  EVALUATION COMPLEXITY: Moderate  PLAN:  PT FREQUENCY: 2x/week  PT DURATION: 6 weeks  PLANNED INTERVENTIONS: 97164- PT Re-evaluation, 97750- Physical Performance Testing, 97110-Therapeutic exercises, 97530- Therapeutic activity, [id number]- Neuromuscular re-education, 97535- Self Care, 02859- Manual therapy, [id number]- Gait training, [id number]- Aquatic Therapy, [phone]- Electrical stimulation (manual), [id number] (1-2 muscles), 20561 (3+  muscles)- Dry Needling, Patient/Family education, Balance training, Stair training, Joint mobilization, Spinal mobilization, Vestibular training, and DME instructions   Marlon BRAVO Laveda Demedeiros, PT, DPT 04/13/2024, 3:54 PM

## 2024-04-13 NOTE — Therapy (Addendum)
 OUTPATIENT OCCUPATIONAL THERAPY NEURO TREATMENT and DISCHARGE  Patient Name: Jackie Reed MRN: [id number] DOB:August 21, 1966, 58 y.o., female Today's Date: 04/13/2024 OCCUPATIONAL THERAPY DISCHARGE SUMMARY  Visits from Start of Care: 8  Current functional level related to goals / functional outcomes: Patient has met all short and long-term goals to date.   Remaining deficits: Remains limited by cognition. Seeing ST to address.   Education / Equipment: Continue with HEP and strategies following OT d/c to maximize function.    Patient agrees to discharge. Patient goals were met. Patient is being discharged due to meeting the stated rehab goals.SABRA  PCP: Waylan Almarie SAUNDERS, MD  REFERRING PROVIDER: Pegge Toribio PARAS, PA-C  END OF SESSION:  OT End of Session - 04/13/24 1454     Visit Number 8    Number of Visits 13    Date for OT Re-Evaluation 04/14/24    Authorization Type [employer]    OT Start Time 1452    OT Stop Time 1530    OT Time Calculation (min) 38 min    Activity Tolerance Patient tolerated treatment well    Behavior During Therapy [facility] for tasks assessed/performed         Past Medical History:  Diagnosis Date   Adenomatous colon polyp 2017   Dyspnea    occasional   E-coli UTI 08/2019   Hepatitis 05/2020   Hepatitis B core antibody positive   Hypertension    Migraine    Plantar fibromatosis    PONV (postoperative nausea and vomiting)    Pre-diabetes    Sarcoidosis    Stroke (HCC)    TB lung, latent    Thyroid  nodule 01/29/2023   seen on PET scan   Vitamin B12 deficiency 06/2019   Vitamin D  deficiency 06/2019   Past Surgical History:  Procedure Laterality Date   ABDOMINAL HYSTERECTOMY     FINE NEEDLE ASPIRATION  02/19/2023   Procedure: FINE NEEDLE ASPIRATION (FNA) LINEAR;  Surgeon: Brenna Adine CROME, DO;  Location: [facility];  Service: Cardiopulmonary;;   FOOT SURGERY Bilateral 1985   MASS EXCISION Right 05/19/2018   Procedure: EXCISION PALMAR NODULES  RIGHT HAND;  Surgeon: Murrell Drivers, MD;  Location: [facility];  Service: Orthopedics;  Laterality: Right;   PARTIAL HYSTERECTOMY  1997   VIDEO BRONCHOSCOPY WITH ENDOBRONCHIAL ULTRASOUND Bilateral 02/19/2023   Procedure: VIDEO BRONCHOSCOPY WITH ENDOBRONCHIAL ULTRASOUND;  Surgeon: Brenna Adine CROME, DO;  Location: [facility];  Service: Cardiopulmonary;  Laterality: Bilateral;   Patient Active Problem List   Diagnosis Date Noted   Hyperlipidemia 03/20/2024   Cognitive and neurobehavioral dysfunction 02/04/2024   Encephalopathy 01/28/2024   Encephalopathy, hypertensive 01/20/2024   Medication monitoring encounter 10/01/2023   TB lung, latent 08/27/2023   Sarcoidosis 04/22/2023   Adenopathy 02/09/2023   History of COVID-19 10/03/2020   Hepatitis B carrier (HCC) 07/03/2020   Mass of left hand 10/02/2019   Elevated blood pressure reading in office with diagnosis of hypertension 09/11/2019   Pre-diabetes 06/14/2019   Low back pain without sciatica 03/07/2019   Back muscle spasm 03/07/2019   History of recent fall 12/31/2018   Sprain of right ankle 11/18/2018   Class 2 severe obesity due to excess calories with serious comorbidity and body mass index (BMI) of 35.0 to 35.9 in adult [facility]) 10/31/2018   Essential hypertension, benign 11/08/2015   Tendon calcification 11/08/2015   Adjustment disorder with mixed anxiety and depressed mood 11/08/2015   Insomnia 11/08/2015    ONSET DATE: 02/04/2024 (Date of  referral)  REFERRING DIAG: G93.40 (ICD-10-CM) - Encephalopathy, unspecified   THERAPY DIAG:  Other lack of coordination  Muscle weakness (generalized)  Other symptoms and signs involving the nervous system  Visuospatial deficit  Other symptoms and signs involving the musculoskeletal system  Rationale for Evaluation and Treatment: Rehabilitation  SUBJECTIVE:   SUBJECTIVE STATEMENT: Pt wanting to place OT on hold until after she sees ST. She anticipates work  modifications upon return.   Update 04/27/2024: Pt requests d/c from OT  Pt accompanied by: self  PERTINENT HISTORY: PMH: HTN, Sarcoidosis, hepatitis, migraines, LBP, Latent TB (positive QuantiFERON test)   PRECAUTIONS: Fall  WEIGHT BEARING RESTRICTIONS: No  PAIN:  Are you having pain? No  FALLS: Has patient fallen in last 6 months? No  LIVING ENVIRONMENT: Lives with: lives with their spouse Lives in: House/apartment Stairs: Yes: Internal: splint level steps; on right going up and no rail to go downstairs and External: 2 steps; none Has following equipment at home: Vannie - 2 wheeled  PLOF: Independent; driving; phone scheduler with computer work; reading; puzzles (crosswords)  PATIENT GOALS: return to work/PLOF  OBJECTIVE:  Note: Objective measures were completed at Evaluation unless otherwise noted.  HAND DOMINANCE: Left  ADLs: Overall ADLs: mod I (increased time)  Eating: unsure - hasn't tried  IADLs: Shopping: max A 8/11 - still goes to the store - rides the cart. Light housekeeping: dependent - 8/11 - folding clothes ans washing dishes Meal Prep: mod I for light meal prep;  Community mobility: dependent - 8/11 - RW Medication management: independent Financial management: independent Handwriting: Increased time and 50% of prior level per pt report  MOBILITY STATUS: Independent  ACTIVITY TOLERANCE: Activity tolerance: fair  FUNCTIONAL OUTCOME MEASURES: PSFS: 5.0 total score 03/28/2024: 6.3 total score   UPPER EXTREMITY ROM:    BUE: WNL  UPPER EXTREMITY MMT:     MMT Right (eval) Left (eval)  Shoulder flexion WNL 4/5  Shoulder abduction WNL 4/5  Elbow flexion WNL 4/5  Elbow extension WNL 4/5  (Blank rows = not tested)  HAND FUNCTION: Grip strength: Right: 49.6 lbs; Left: 19.6 lbs  COORDINATION: 9 Hole Peg test: Right: 19 sec; Left: 32 sec  SENSATION: WFL  EDEMA: none reported or observed  MUSCLE TONE: WFL  COGNITION: Overall cognitive  status: Impaired and reports more difficulty with memory since hospitalization  VISION: Subjective report: no changes Baseline vision: Wears glasses all the time and progressive lenses Visual history: none  VISION ASSESSMENT: WFL  PERCEPTION: WFL  PRAXIS: Impaired: Motor planning  OBSERVATIONS: Pt ambulates with use of RW. No loss of balance. The pt appears well kept and has glasses donned.                                                                                                                          TODAY'S TREATMENT :    OT provided pt with list of  left handed typing words for typing practice. Cues to utilize only  left hand with completion.  OT had pt complete type toss game with no significant change from prior session.  OT discussed d/c vs placing OT on hold given uncertainty of ST goals and needs to return to work.  PATIENT EDUCATION: Education details: typing; POC Person educated: Patient Education method: Explanation and Verbal cues Education comprehension: verbalized understanding, verbal cues required, and needs further education  HOME EXERCISE PROGRAM: 03/07/2024: putty and coordination HEPs 04/13/2024: L handed typing words  GOALS:  SHORT TERM GOALS: MET 2/2   LONG TERM GOALS: Target date: 04/14/24   Patient will demonstrate updated L UE HEP with visual handouts only for proper execution. Baseline:  Goal status: MET  2.  Patient will demo improved FM coordination as evidenced by completing nine-hole peg with use of L in 25 seconds or less. Baseline: Right: 19 sec; Left: 32 sec 03/28/2024: 21 seconds Goal status: MET  3.  Patient will demonstrate at least 30 lbs L grip strength as needed to open jars and other containers. Baseline: Right: 49.6 lbs; Left: 19.6 lbs 03/28/2024: 32.6 lbs Goal status: MET  4.  Patient will report at least two-point increase in average PSFS score or at least three-point increase in a single activity score indicating  functionally significant improvement given minimum detectable change. Baseline: 5.0 total score (See above for individual activity scores) 03/28/2024: 6.3 total score Goal status: MET  ASSESSMENT:  CLINICAL IMPRESSION: Patient has made significant functional improvements from eval though is not back to PLOF with respect to mutli-tasking, processing, and memory. This limits her ability to return to work; however, it is anticipated this will be addressed with ST. Will wait for ST eval to determine if OT could be supplemental in getting pt back to PLOF. Update 04/27/2024: Pt requests d/c from OT PERFORMANCE DEFICITS: in functional skills including ADLs, IADLs, coordination, strength, Fine motor control, endurance, decreased knowledge of use of DME, vision, and UE functional use and cognitive skills including memory.   IMPAIRMENTS: are limiting patient from ADLs, IADLs, work, leisure, and social participation.   CO-MORBIDITIES: may have co-morbidities  that affects occupational performance. Patient will benefit from skilled OT to address above impairments and improve overall function.  REHAB POTENTIAL: Good  PLAN:  OT FREQUENCY: Pt on hold Update 04/27/2024: Pt requests d/c from OT  CONSULTED AND AGREED WITH PLAN OF CARE: Patient  Jocelyn CHRISTELLA Bottom, OT 04/13/2024, 2:55 PM

## 2024-04-13 NOTE — Patient Instructions (Signed)
 Typing:  Left-handed Words  wear were are bear bare care dear deer stare react dare  sad gear great rate  date red read fear free tea tear fast waste treat tease zest wax cast vase vast Cat basket earn casket bee seat ate gas rare seed tax fax feed cart art tart taste ear here

## 2024-04-13 NOTE — Telephone Encounter (Signed)
 Jackie Reed stopped by to see if we have received her FMLA papers yet. I didn't see anything in her chart. Can you confirm with pt if we have received them via fax please

## 2024-04-17 ENCOUNTER — Ambulatory Visit: Admitting: Speech Pathology

## 2024-04-17 ENCOUNTER — Encounter: Payer: Self-pay | Admitting: Family Medicine

## 2024-04-17 ENCOUNTER — Encounter: Payer: Self-pay | Admitting: Speech Pathology

## 2024-04-17 DIAGNOSIS — R41841 Cognitive communication deficit: Secondary | ICD-10-CM

## 2024-04-17 DIAGNOSIS — M6281 Muscle weakness (generalized): Secondary | ICD-10-CM | POA: Diagnosis not present

## 2024-04-17 NOTE — Therapy (Signed)
 OUTPATIENT SPEECH LANGUAGE PATHOLOGY EVALUATION   Patient Name: Jackie Reed MRN: [id number] DOB:May 30, 1966, 58 y.o., female Today's Date: 04/17/2024  PCP: Billy Philippe SAUNDERS, NP REFERRING PROVIDER: Lorilee Sven SQUIBB, MD  END OF SESSION:  End of Session - 04/17/24 0853     Visit Number 1    SLP Start Time 0847    SLP Stop Time  0930    SLP Time Calculation (min) 43 min    Activity Tolerance Patient tolerated treatment well          Past Medical History:  Diagnosis Date   Adenomatous colon polyp 2017   Dyspnea    occasional   E-coli UTI 08/2019   Hepatitis 05/2020   Hepatitis B core antibody positive   Hypertension    Migraine    Plantar fibromatosis    PONV (postoperative nausea and vomiting)    Pre-diabetes    Sarcoidosis    Stroke (HCC)    TB lung, latent    Thyroid  nodule 01/29/2023   seen on PET scan   Vitamin B12 deficiency 06/2019   Vitamin D  deficiency 06/2019   Past Surgical History:  Procedure Laterality Date   ABDOMINAL HYSTERECTOMY     FINE NEEDLE ASPIRATION  02/19/2023   Procedure: FINE NEEDLE ASPIRATION (FNA) LINEAR;  Surgeon: Brenna Adine CROME, DO;  Location: [facility];  Service: Cardiopulmonary;;   FOOT SURGERY Bilateral 1985   MASS EXCISION Right 05/19/2018   Procedure: EXCISION PALMAR NODULES RIGHT HAND;  Surgeon: Murrell Drivers, MD;  Location: [facility];  Service: Orthopedics;  Laterality: Right;   PARTIAL HYSTERECTOMY  1997   VIDEO BRONCHOSCOPY WITH ENDOBRONCHIAL ULTRASOUND Bilateral 02/19/2023   Procedure: VIDEO BRONCHOSCOPY WITH ENDOBRONCHIAL ULTRASOUND;  Surgeon: Brenna Adine CROME, DO;  Location: [facility];  Service: Cardiopulmonary;  Laterality: Bilateral;   Patient Active Problem List   Diagnosis Date Noted   Hyperlipidemia 03/20/2024   Cognitive and neurobehavioral dysfunction 02/04/2024   Encephalopathy 01/28/2024   Encephalopathy, hypertensive 01/20/2024   Medication monitoring encounter 10/01/2023   TB  lung, latent 08/27/2023   Sarcoidosis 04/22/2023   Adenopathy 02/09/2023   History of COVID-19 10/03/2020   Hepatitis B carrier (HCC) 07/03/2020   Mass of left hand 10/02/2019   Elevated blood pressure reading in office with diagnosis of hypertension 09/11/2019   Pre-diabetes 06/14/2019   Low back pain without sciatica 03/07/2019   Back muscle spasm 03/07/2019   History of recent fall 12/31/2018   Sprain of right ankle 11/18/2018   Class 2 severe obesity due to excess calories with serious comorbidity and body mass index (BMI) of 35.0 to 35.9 in adult [facility]) 10/31/2018   Essential hypertension, benign 11/08/2015   Tendon calcification 11/08/2015   Adjustment disorder with mixed anxiety and depressed mood 11/08/2015   Insomnia 11/08/2015    ONSET DATE: Referred on 03/07/24  REFERRING DIAG: R41.89 (ICD-10-CM) - Cognitive impairment   THERAPY DIAG:  Cognitive communication deficit  Rationale for Evaluation and Treatment: Rehabilitation  SUBJECTIVE:   SUBJECTIVE STATEMENT: Pt was pleasant and cooperative throughout assessment.  Pt accompanied by: self; driven by brother; Husband Harper)  PERTINENT HISTORY: Per chart review: Pt is a 58 yo female admitted to [facility] on 01/19/24 for acute onset L sided weakness. She received TNK at [facility], MRI was negative for acute infarcts. PMH of  sarcoidosis, latent TB, migraines and prediabetes.   PAIN:  Are you having pain? No  FALLS: Has patient fallen in last 6 months?  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  DIAGNOSTIC FINDINGS: Per EMR  CT HEAD CODE STROKE WO CONTRAST  IMPRESSION: 1. No CT evidence of acute intracranial  abnormality. 2. Mild chronic microvascular ischemic changes. 3. ASPECTS is 10  Electronically Signed   By: Donnice Mania M.D.   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 Scores from 10 tasks were used to estimate severity ratings (standardized for age groups 18-69 years and 70-89 years) for each domain, a clock drawing task, as well as an overall composite severity rating of cognition.       Task Score Criterion Cut Scores  Personal Facts 8/8 8  Symbol Cancellation 11/12 11  Confrontation Naming -/10 10  Clock Drawing  8/13 12  Story Retelling 5/10 6  Symbol Trails 1/10 9  Generative Naming 4/9 5  Design Memory -/6 5  Mazes  0/8 7  Design Generation -/13 6     PATIENT REPORTED OUTCOME MEASURES (PROM): To complete in subsequent sessions.                                                                                                                             TREATMENT DATE:     PATIENT EDUCATION: Education details: SLP role  cognitive-communication Person educated: Patient Education method: Explanation Education comprehension: verbalized understanding and needs further education   GOALS: Goals reviewed with patient? No  SHORT TERM GOALS: Target date: 05/18/24  Complete CLQT, PROM, and update goals to reflect Baseline: Goal status: INITIAL  2.  Pt will recall 2+ word finding strategies to aid instances of anomia Baseline:  Goal status: INITIAL  3.   Baseline:  Goal status: INITIAL  4.   Baseline:  Goal status: INITIAL  5.   Baseline:  Goal status: INITIAL  6.   Baseline:  Goal status: INITIAL  LONG TERM GOALS: Target date: 06/17/24  Pt will improve score on PROM Baseline:  Goal status: INITIAL  2.  Pt will demonstrate use of word finding stategies in structured conversation.  Baseline:  Goal status: INITIAL  3.   Baseline:  Goal status: INITIAL  4.   Baseline:  Goal status: INITIAL  5.   Baseline:  Goal status: INITIAL  6.   Baseline:  Goal status: INITIAL  ASSESSMENT:  CLINICAL IMPRESSION: Pt is a 58 yo female who presents to ST OP for evaluation post CVA. Pt endorses difficulty with memory - which plays a big role in her job, difficulty finding words, losing train of thought, and feeling slowed down. No hx of anxiety/depression - reports she is feeling more down since CVA due to reduced independence. When asked, she feels like she has difficulty doing multiple things at once. Pt was assessed using CLQT - see above for subtests results. To complete along with PROMs next session. SLP observed slowed processing, need for repetition, and halting anomia in conversation. SLP rec skilled ST services to address cognitive-communication impairment to facilitate return to work.       OBJECTIVE IMPAIRMENTS: include attention, memory, executive functioning, and expressive language. These impairments are limiting patient from return to work, managing medications, managing  appointments, managing finances, household responsibilities, and effectively communicating at home and in community. Factors affecting potential to achieve goals and functional outcome are NA.SABRA Patient will benefit from skilled SLP services to address above impairments and improve overall function.  REHAB POTENTIAL: Good  PLAN:  SLP FREQUENCY: 1x/week  SLP DURATION: 8 weeks  PLANNED INTERVENTIONS: Environmental controls, Cueing hierachy, Cognitive reorganization, Internal/external aids, Functional tasks, SLP instruction and feedback, Compensatory strategies, Patient/family education, and [id number] Treatment of speech (30 or 45 min)     [employer], CCC-SLP 04/17/2024, 8:53 AM

## 2024-04-22 ENCOUNTER — Ambulatory Visit (HOSPITAL_COMMUNITY)
Admission: EM | Admit: 2024-04-22 | Discharge: 2024-04-22 | Disposition: A | Discharge status: Home / Self Care | Attending: Internal Medicine | Admitting: Internal Medicine

## 2024-04-22 DIAGNOSIS — J069 Acute upper respiratory infection, unspecified: Secondary | ICD-10-CM | POA: Diagnosis not present

## 2024-04-22 DIAGNOSIS — Z760 Encounter for issue of repeat prescription: Secondary | ICD-10-CM | POA: Diagnosis not present

## 2024-04-22 DIAGNOSIS — R0602 Shortness of breath: Secondary | ICD-10-CM

## 2024-04-22 MED ORDER — FLUTICASONE FUROATE-VILANTEROL 100-25 MCG/ACT IN AEPB: 1.0000 | INHALATION_SPRAY | Freq: Every day | RESPIRATORY_TRACT | 0 refills | Status: DC

## 2024-04-22 MED ORDER — LEVALBUTEROL TARTRATE 45 MCG/ACT IN AERO: INHALATION_SPRAY | RESPIRATORY_TRACT | 0 refills | Status: AC

## 2024-04-22 MED ORDER — LEVALBUTEROL TARTRATE 45 MCG/ACT IN AERO: INHALATION_SPRAY | RESPIRATORY_TRACT | 0 refills | Status: DC

## 2024-04-22 MED ORDER — IPRATROPIUM-ALBUTEROL 0.5-2.5 (3) MG/3ML IN SOLN
3.0000 mL | Freq: Once | RESPIRATORY_TRACT | Status: AC
  Administered 2024-04-22: 3 mL via RESPIRATORY_TRACT

## 2024-04-22 NOTE — ED Provider Notes (Signed)
 [facility]    CSN: [id number] Arrival date & time: 04/22/24  1701      History   Chief Complaint Chief Complaint  Patient presents with   Shortness of Breath    HPI Jackie Reed is a 58 y.o. female presents due to having a cold for the past week and today she felt more SOB. States she has not had body aches, fever, chest pain, fatigue, sweating or change in her appetite. Her Sarcoid inhaler was changed in May to Symbicort  and feels this is not helping as the Breo did and would like to go back to the [place]. Her cough is non productive.     Past Medical History:  Diagnosis Date   Adenomatous colon polyp 2017   Dyspnea    occasional   E-coli UTI 08/2019   Hepatitis 05/2020   Hepatitis B core antibody positive   Hypertension    Migraine    Plantar fibromatosis    PONV (postoperative nausea and vomiting)    Pre-diabetes    Sarcoidosis    Stroke (HCC)    TB lung, latent    Thyroid  nodule 01/29/2023   seen on PET scan   Vitamin B12 deficiency 06/2019   Vitamin D  deficiency 06/2019    Patient Active Problem List   Diagnosis Date Noted   Hyperlipidemia 03/20/2024   Cognitive and neurobehavioral dysfunction 02/04/2024   Encephalopathy 01/28/2024   Encephalopathy, hypertensive 01/20/2024   Medication monitoring encounter 10/01/2023   TB lung, latent 08/27/2023   Sarcoidosis 04/22/2023   Adenopathy 02/09/2023   History of COVID-19 10/03/2020   Hepatitis B carrier (HCC) 07/03/2020   Mass of left hand 10/02/2019   Elevated blood pressure reading in office with diagnosis of hypertension 09/11/2019   Pre-diabetes 06/14/2019   Low back pain without sciatica 03/07/2019   Back muscle spasm 03/07/2019   History of recent fall 12/31/2018   Sprain of right ankle 11/18/2018   Class 2 severe obesity due to excess calories with serious comorbidity and body mass index (BMI) of 35.0 to 35.9 in adult [facility]) 10/31/2018   Essential hypertension, benign 11/08/2015   Tendon  calcification 11/08/2015   Adjustment disorder with mixed anxiety and depressed mood 11/08/2015   Insomnia 11/08/2015    Past Surgical History:  Procedure Laterality Date   ABDOMINAL HYSTERECTOMY     FINE NEEDLE ASPIRATION  02/19/2023   Procedure: FINE NEEDLE ASPIRATION (FNA) LINEAR;  Surgeon: Brenna Adine CROME, DO;  Location: [facility];  Service: Cardiopulmonary;;   FOOT SURGERY Bilateral 1985   MASS EXCISION Right 05/19/2018   Procedure: EXCISION PALMAR NODULES RIGHT HAND;  Surgeon: Murrell Drivers, MD;  Location: [facility];  Service: Orthopedics;  Laterality: Right;   PARTIAL HYSTERECTOMY  1997   VIDEO BRONCHOSCOPY WITH ENDOBRONCHIAL ULTRASOUND Bilateral 02/19/2023   Procedure: VIDEO BRONCHOSCOPY WITH ENDOBRONCHIAL ULTRASOUND;  Surgeon: Brenna Adine CROME, DO;  Location: [facility];  Service: Cardiopulmonary;  Laterality: Bilateral;    OB History     Gravida  2   Para      Term      Preterm      AB      Living  2      SAB      IAB      Ectopic      Multiple      Live Births               Home Medications    Prior to Admission  medications   Medication Sig Start Date End Date Taking? Authorizing Provider  acetaminophen  (TYLENOL ) 325 MG tablet Take 1-2 tablets (325-650 mg total) by mouth every 4 (four) hours as needed for mild pain (pain score 1-3). 02/07/24   Leak, Brandi L, NP  amLODipine  (NORVASC ) 5 MG tablet Take 1 tablet (5 mg total) by mouth daily. 03/20/24   Billy Philippe SAUNDERS, NP  aspirin  EC 81 MG tablet Take 1 tablet (81 mg total) by mouth daily. Swallow whole. 03/06/24   Raulkar, Sven SQUIBB, MD  baclofen  (LIORESAL ) 10 MG tablet Take 1 tablet (10 mg total) by mouth 3 (three) times daily as needed for muscle spasms. 02/07/24   Leak, Brandi L, NP  diclofenac  Sodium (VOLTAREN ) 1 % GEL Apply 2 g topically 4 (four) times daily. 02/08/24   Leak, Brandi L, NP  fluticasone  furoate-vilanterol (BREO ELLIPTA ) 100-25 MCG/ACT AEPB Inhale 1 puff into the  lungs daily. 04/22/24   Rodriguez-Southworth, Vianka Ertel, PA-C  hydrochlorothiazide  (HYDRODIURIL ) 25 MG tablet Take 1 tablet (25 mg total) by mouth daily. 03/20/24   Billy Philippe SAUNDERS, NP  levalbuterol  (XOPENEX  HFA) 45 MCG/ACT inhaler Prn wheezing, cough  and shortness of breath from a cold 04/22/24   Rodriguez-Southworth, Kyra, PA-C  rosuvastatin  (CRESTOR ) 20 MG tablet Take 1 tablet (20 mg total) by mouth daily. 03/20/24 06/18/24  Billy Philippe SAUNDERS, NP  senna-docusate (SENOKOT-S) 8.6-50 MG tablet Take 2 tablets by mouth 2 (two) times daily. 02/08/24   Leak, Daphne CROME, NP    Family History Family History  Problem Relation Age of Onset   Heart failure Mother    Diabetes Mother    Heart failure Father    Hypertension Father    Hyperlipidemia Father    Colon cancer Neg Hx    Colon polyps Neg Hx    Breast cancer Neg Hx    Crohn's disease Neg Hx    Esophageal cancer Neg Hx    Rectal cancer Neg Hx    Stomach cancer Neg Hx    Ulcerative colitis Neg Hx     Social History Social History   Tobacco Use   Smoking status: Never   Smokeless tobacco: Never  Vaping Use   Vaping status: Never Used  Substance Use Topics   Alcohol use: No    Alcohol/week: 0.0 standard drinks of alcohol   Drug use: No     Allergies   Amoxil  [amoxicillin ], Morphine  and codeine, and Ultram  [tramadol ]   Review of Systems Review of Systems As noted in HPI  Physical Exam Triage Vital Signs ED Triage Vitals [04/22/24 1755]  Encounter Vitals Group     BP 115/72     Girls Systolic BP Percentile      Girls Diastolic BP Percentile      Boys Systolic BP Percentile      Boys Diastolic BP Percentile      Pulse Rate 77     Resp 20     Temp 98.4 F (36.9 C)     Temp Source Oral     SpO2 97 %     Weight      Height      Head Circumference      Peak Flow      Pain Score 8     Pain Loc      Pain Education      Exclude from Growth Chart    No data found.  Updated Vital Signs BP 115/72 (BP Location:  Right Arm)   Pulse 77  Temp 98.4 F (36.9 C) (Oral)   Resp 20   SpO2 97%   Visual Acuity Right Eye Distance:   Left Eye Distance:   Bilateral Distance:    Right Eye Near:   Left Eye Near:    Bilateral Near:     Physical Exam Vitals and nursing note reviewed.  Constitutional:      General: She is not in acute distress.    Appearance: She is well-developed. She is not ill-appearing, toxic-appearing or diaphoretic.     Comments: She is able to talk full sentences and does not seem SOB  HENT:     Right Ear: Tympanic membrane, ear canal and external ear normal.     Left Ear: Tympanic membrane, ear canal and external ear normal.     Nose: Nose normal.     Mouth/Throat:     Mouth: Mucous membranes are moist.  Eyes:     General: No scleral icterus.    Conjunctiva/sclera: Conjunctivae normal.  Cardiovascular:     Rate and Rhythm: Normal rate and regular rhythm.     Heart sounds: No murmur heard. Pulmonary:     Effort: Pulmonary effort is normal.     Comments: Had mild wheezing on RLL, but resolved after albuterol  neb Musculoskeletal:        General: Normal range of motion.     Cervical back: Normal range of motion.  Lymphadenopathy:     Cervical: No cervical adenopathy.  Skin:    General: Skin is warm and dry.  Neurological:     Mental Status: She is alert and oriented to person, place, and time.     Gait: Gait normal.  Psychiatric:        Mood and Affect: Mood normal.        Behavior: Behavior normal.        Thought Content: Thought content normal.        Judgment: Judgment normal.    UC Treatments / Results  Labs (all labs ordered are listed, but only abnormal results are displayed) Labs Reviewed - No data to display  EKG   Radiology No results found.  Procedures Procedures (including critical care time)  Medications Ordered in UC Medications  ipratropium-albuterol  (DUONEB) 0.5-2.5 (3) MG/3ML nebulizer solution 3 mL (3 mLs Nebulization Given 04/22/24  1827)    Initial Impression / Assessment and Plan / UC Course  I have reviewed the triage vital signs and the nursing notes. URI causing mild wheezing.  She was given Albuterol  neb treatment and pulse ox went to 99%, and she felt she was no longer SOB. I sent Rx for Breo and Xopenex  as noted. Needs to continue FU with pulmonologist next month as scheduled.  Final Clinical Impressions(s) / UC Diagnoses   Final diagnoses:  Viral upper respiratory tract infection with cough  Medication refill  SOB (shortness of breath)     Discharge Instructions      Use the new inhaler I am prescribing you when the cough gets bad from your cough and to help shortness of breath Stop the inhaler from the hospital, and switch back to your prior Ranell Sallies one since this has helped you better.     ED Prescriptions     Medication Sig Dispense Auth. Provider   fluticasone  furoate-vilanterol (BREO ELLIPTA ) 100-25 MCG/ACT AEPB  (Status: Discontinued) Inhale 1 puff into the lungs daily. 1 each Rodriguez-Southworth, Siddharth Babington, PA-C   levalbuterol  (XOPENEX  HFA) 45 MCG/ACT inhaler  (Status: Discontinued) Prn wheezing, cough  and shortness of breath from a cold 45 g Rodriguez-Southworth, Makena Mcgrady, PA-C   fluticasone  furoate-vilanterol (BREO ELLIPTA ) 100-25 MCG/ACT AEPB Inhale 1 puff into the lungs daily. 1 each Rodriguez-Southworth, Kyra, PA-C   levalbuterol  (XOPENEX  HFA) 45 MCG/ACT inhaler Prn wheezing, cough  and shortness of breath from a cold 45 g Rodriguez-Southworth, Kyra, PA-C      PDMP not reviewed this encounter.   Lindi Kyra, PA-C 04/22/24 1921

## 2024-04-22 NOTE — ED Triage Notes (Signed)
 Pt reports that for a couple days had shortness of breath and chest tightness. Used inhaler that was given when in the hospital but not helping. Reports that had a different inhaler before that worked better. Reports getting over a really bad cold and had cough and chest tightness then. Pt has sarcoidosis.

## 2024-04-22 NOTE — Discharge Instructions (Signed)
 Use the new inhaler I am prescribing you when the cough gets bad from your cough and to help shortness of breath Stop the inhaler from the hospital, and switch back to your prior Jackie Reed one since this has helped you better.

## 2024-04-25 ENCOUNTER — Encounter: Payer: Self-pay | Admitting: Speech Pathology

## 2024-04-25 ENCOUNTER — Ambulatory Visit: Admitting: Speech Pathology

## 2024-04-25 ENCOUNTER — Encounter: Payer: Self-pay | Admitting: Family Medicine

## 2024-04-25 ENCOUNTER — Ambulatory Visit: Attending: Physical Medicine and Rehabilitation | Admitting: Speech Pathology

## 2024-04-25 DIAGNOSIS — R41841 Cognitive communication deficit: Secondary | ICD-10-CM | POA: Diagnosis present

## 2024-04-25 NOTE — Therapy (Signed)
 OUTPATIENT SPEECH LANGUAGE PATHOLOGY TREATMENT   Patient Name: Jackie Reed MRN: [id number] DOB:[birth year], 58 y.o., female Today's Date: 04/25/2024  PCP: Billy Philippe SAUNDERS, NP REFERRING PROVIDER: Lorilee Sven SQUIBB, MD  END OF SESSION:  End of Session - 04/25/24 1235     Visit Number 2    Number of Visits 9    Date for SLP Re-Evaluation 06/17/24    SLP Start Time 1230    SLP Stop Time  1310    SLP Time Calculation (min) 40 min    Activity Tolerance Patient tolerated treatment well          Past Medical History:  Diagnosis Date   Adenomatous colon polyp 2017   Dyspnea    occasional   E-coli UTI 08/2019   Hepatitis 05/2020   Hepatitis B core antibody positive   Hypertension    Migraine    Plantar fibromatosis    PONV (postoperative nausea and vomiting)    Pre-diabetes    Sarcoidosis    Stroke (HCC)    TB lung, latent    Thyroid  nodule 01/29/2023   seen on PET scan   Vitamin B12 deficiency 06/2019   Vitamin D  deficiency 06/2019   Past Surgical History:  Procedure Laterality Date   ABDOMINAL HYSTERECTOMY     FINE NEEDLE ASPIRATION  02/19/2023   Procedure: FINE NEEDLE ASPIRATION (FNA) LINEAR;  Surgeon: Brenna Adine CROME, DO;  [facility];  Service: Cardiopulmonary;;   FOOT SURGERY Bilateral 1985   MASS EXCISION Right 05/19/2018   Procedure: EXCISION PALMAR NODULES RIGHT HAND;  Surgeon: Murrell Drivers, MD;  Location: [facility];  Service: Orthopedics;  Laterality: Right;   PARTIAL HYSTERECTOMY  1997   VIDEO BRONCHOSCOPY WITH ENDOBRONCHIAL ULTRASOUND Bilateral 02/19/2023   Procedure: VIDEO BRONCHOSCOPY WITH ENDOBRONCHIAL ULTRASOUND;  Surgeon: Brenna Adine CROME, DO;  Location: [facility];  Service: Cardiopulmonary;  Laterality: Bilateral;   Patient Active Problem List   Diagnosis Date Noted   Hyperlipidemia 03/20/2024   Cognitive and neurobehavioral dysfunction 02/04/2024   Encephalopathy 01/28/2024   Encephalopathy, hypertensive  01/20/2024   Medication monitoring encounter 10/01/2023   TB lung, latent 08/27/2023   Sarcoidosis 04/22/2023   Adenopathy 02/09/2023   History of COVID-19 10/03/2020   Hepatitis B carrier (HCC) 07/03/2020   Mass of left hand 10/02/2019   Elevated blood pressure reading in office with diagnosis of hypertension 09/11/2019   Pre-diabetes 06/14/2019   Low back pain without sciatica 03/07/2019   Back muscle spasm 03/07/2019   History of recent fall 12/31/2018   Sprain of right ankle 11/18/2018   Class 2 severe obesity due to excess calories with serious comorbidity and body mass index (BMI) of 35.0 to 35.9 in adult [facility]) 10/31/2018   Essential hypertension, benign 11/08/2015   Tendon calcification 11/08/2015   Adjustment disorder with mixed anxiety and depressed mood 11/08/2015   Insomnia 11/08/2015    ONSET DATE: Referred on 03/07/24  REFERRING DIAG: R41.89 (ICD-10-CM) - Cognitive impairment   THERAPY DIAG:  Cognitive communication deficit  Rationale for Evaluation and Treatment: Rehabilitation  SUBJECTIVE:   SUBJECTIVE STATEMENT: Pt with questions about return to work.   Pt accompanied by: self; driven by brother; Husband Harper)  PERTINENT HISTORY: Per chart review: Pt is a 58 yo female admitted to [facility] on 01/19/24 for acute onset L sided weakness. She received TNK at [facility], MRI was negative for acute infarcts. PMH of  sarcoidosis, latent TB, migraines and prediabetes.   PAIN:  Are you having pain?  No  FALLS: Has patient fallen in last 6 months?  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  DIAGNOSTIC FINDINGS: Per EMR  CT HEAD CODE STROKE WO CONTRAST  IMPRESSION: 1.  No CT evidence of acute intracranial abnormality. 2. Mild chronic microvascular ischemic changes. 3. ASPECTS is 10  Electronically Signed   By: Donnice Mania M.D.   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 Scores from 10 tasks were used to estimate severity ratings (standardized for age groups 18-69 years and 70-89 years) for each domain, a clock drawing task, as well as an overall composite severity rating of cognition.       Task Score Criterion Cut Scores  Personal Facts 8/8 8  Symbol Cancellation 11/12 11  Confrontation Naming 10/10 10  Clock Drawing  8/13 12  Story Retelling 5/10 6  Symbol Trails 1/10 9  Generative Naming 4/9 5  Design Memory 4/6 5  Mazes  0/8 7  Design Generation -/13 6    Cognitive Domain Composite Score Severity Rating  Attention 120/215 Moderate  Memory 130/185 Moderate  Executive Function 7/40 Severe  Language 27/37 Mild  Visuospatial Skills 44/105 Moderate  Clock Drawing  8/13 Moderate  Composite Severity Rating   Moderate     PATIENT REPORTED OUTCOME MEASURES (PROM): Neuro QOL - Cognition: 73                                                                                                                            TREATMENT DATE:   04/25/24: Pt reports she had a discussion with her supervisor about options for returning to work re: reduced hours. No return-to-work date is set at this time. Pt completed CLQT and Cognitive PROM - see above for scores. SLP reviewed results with patient. Patient is to contact Neuro location, as she prefers later appointments due to transportation. She will call and schedule continued tx at [facility], if she is unable to get an appointment.     PATIENT EDUCATION: Education details: SLP  role cognitive-communication Person educated: Patient Education method: Explanation Education comprehension: verbalized understanding and needs further education   GOALS: Goals reviewed with patient? No  SHORT TERM GOALS: Target date: 05/18/24  Complete CLQT, PROM, and update goals to reflect Baseline: Goal status: MET  2.  Pt will recall 2+ word finding strategies to aid instances of anomia Baseline:  Goal status: INITIAL  3.  Pt will verbalize 2 memory strategies to support recall of information in conversations Baseline:  Goal status: INITIAL  4.  Pt will verbalize 2 attention strategies to improve active listening skills in unstructured conversations. Baseline:  Goal status: INITIAL    LONG TERM GOALS: Target date: 06/17/24  Pt will improve score on PROM Baseline:  Goal status: INITIAL  2.  Pt will demonstrate use of word finding stategies in structured conversation.  Baseline:  Goal status: INITIAL  3.  Pt will report successful use of memory compensations for daily tasks  Baseline:  Goal status: INITIAL  4.  Pt will report successful use of active listening strategies for remaining engaged in conversations with family and friends.  Baseline:  Goal status:  INITIAL    ASSESSMENT:  CLINICAL IMPRESSION: Pt is a 59 yo female who presents to ST OP for evaluation post CVA. Pt endorses difficulty with memory - which plays a big role in her job, difficulty finding words, losing train of thought, and feeling slowed down. No hx of anxiety/depression - reports she is feeling more down since CVA due to reduced independence. When asked, she feels like she has difficulty doing multiple things at once. Pt was assessed using CLQT - see above for subtests results. To complete along with PROMs next session. SLP observed slowed processing, need for repetition, and halting anomia in conversation. SLP rec skilled ST services to address cognitive-communication impairment to facilitate return to work.       OBJECTIVE IMPAIRMENTS: include attention, memory, executive functioning, and expressive language. These impairments are limiting patient from return to work, managing medications, managing appointments, managing finances, household responsibilities, and effectively communicating at home and in community. Factors affecting potential to achieve goals and functional outcome are NA.SABRA Patient will benefit from skilled SLP services to address above impairments and improve overall function.  REHAB POTENTIAL: Good  PLAN:  SLP FREQUENCY: 1x/week  SLP DURATION: 8 weeks  PLANNED INTERVENTIONS: Environmental controls, Cueing hierachy, Cognitive reorganization, Internal/external aids, Functional tasks, SLP instruction and feedback, Compensatory strategies, Patient/family education, and [id number] Treatment of speech (30 or 45 min)     [employer], CCC-SLP 04/25/2024, 12:36 PM

## 2024-04-28 ENCOUNTER — Encounter: Payer: Self-pay | Admitting: Family Medicine

## 2024-05-01 ENCOUNTER — Other Ambulatory Visit: Payer: Self-pay | Admitting: Family Medicine

## 2024-05-01 DIAGNOSIS — Z0279 Encounter for issue of other medical certificate: Secondary | ICD-10-CM

## 2024-05-02 ENCOUNTER — Ambulatory Visit: Admitting: Speech Pathology

## 2024-05-02 ENCOUNTER — Encounter: Payer: Self-pay | Admitting: Speech Pathology

## 2024-05-02 DIAGNOSIS — R41841 Cognitive communication deficit: Secondary | ICD-10-CM | POA: Diagnosis not present

## 2024-05-02 NOTE — Therapy (Signed)
 OUTPATIENT SPEECH LANGUAGE PATHOLOGY TREATMENT   Patient Name: Jackie Reed MRN: [id number] DOB:1966-01-13, 58 y.o., female Today's Date: 05/02/2024  PCP: Billy Philippe SAUNDERS, NP REFERRING PROVIDER: Lorilee Sven SQUIBB, MD  END OF SESSION:  End of Session - 05/02/24 1021     Visit Number 3    Number of Visits 9    Date for SLP Re-Evaluation 06/17/24    SLP Start Time 1015    SLP Stop Time  1100    SLP Time Calculation (min) 45 min    Activity Tolerance Patient tolerated treatment well          Past Medical History:  Diagnosis Date   Adenomatous colon polyp 2017   Dyspnea    occasional   E-coli UTI 08/2019   Hepatitis 05/2020   Hepatitis B core antibody positive   Hypertension    Migraine    Plantar fibromatosis    PONV (postoperative nausea and vomiting)    Pre-diabetes    Sarcoidosis    Stroke (HCC)    TB lung, latent    Thyroid  nodule 01/29/2023   seen on PET scan   Vitamin B12 deficiency 06/2019   Vitamin D  deficiency 06/2019   Past Surgical History:  Procedure Laterality Date   ABDOMINAL HYSTERECTOMY     FINE NEEDLE ASPIRATION  02/19/2023   Procedure: FINE NEEDLE ASPIRATION (FNA) LINEAR;  Surgeon: Brenna Adine CROME, DO;  Location: [facility];  Service: Cardiopulmonary;;   FOOT SURGERY Bilateral 1985   MASS EXCISION Right 05/19/2018   Procedure: EXCISION PALMAR NODULES RIGHT HAND;  Surgeon: Murrell Drivers, MD;  Location: [facility];  Service: Orthopedics;  Laterality: Right;   PARTIAL HYSTERECTOMY  1997   VIDEO BRONCHOSCOPY WITH ENDOBRONCHIAL ULTRASOUND Bilateral 02/19/2023   Procedure: VIDEO BRONCHOSCOPY WITH ENDOBRONCHIAL ULTRASOUND;  Surgeon: Brenna Adine CROME, DO;  Location: [facility];  Service: Cardiopulmonary;  Laterality: Bilateral;   Patient Active Problem List   Diagnosis Date Noted   Hyperlipidemia 03/20/2024   Cognitive and neurobehavioral dysfunction 02/04/2024   Encephalopathy 01/28/2024   Encephalopathy, hypertensive  01/20/2024   Medication monitoring encounter 10/01/2023   TB lung, latent 08/27/2023   Sarcoidosis 04/22/2023   Adenopathy 02/09/2023   History of COVID-19 10/03/2020   Hepatitis B carrier (HCC) 07/03/2020   Mass of left hand 10/02/2019   Elevated blood pressure reading in office with diagnosis of hypertension 09/11/2019   Pre-diabetes 06/14/2019   Low back pain without sciatica 03/07/2019   Back muscle spasm 03/07/2019   History of recent fall 12/31/2018   Sprain of right ankle 11/18/2018   Class 2 severe obesity due to excess calories with serious comorbidity and body mass index (BMI) of 35.0 to 35.9 in adult [facility]) 10/31/2018   Essential hypertension, benign 11/08/2015   Tendon calcification 11/08/2015   Adjustment disorder with mixed anxiety and depressed mood 11/08/2015   Insomnia 11/08/2015    ONSET DATE: Referred on 03/07/24  REFERRING DIAG: R41.89 (ICD-10-CM) - Cognitive impairment   THERAPY DIAG:  Cognitive communication deficit  Rationale for Evaluation and Treatment: Rehabilitation  SUBJECTIVE:   SUBJECTIVE STATEMENT: Pt reports concerns regarding returning to work.   Pt accompanied by: self; driven by brother; Husband Harper)  PERTINENT HISTORY: Per chart review: Pt is a 58 yo female admitted to [facility] on 01/19/24 for acute onset L sided weakness. She received TNK at [facility], MRI was negative for acute infarcts. PMH of  sarcoidosis, latent TB, migraines and prediabetes.   PAIN:  Are you having pain?  No  FALLS: Has patient fallen in last 6 months?  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  DIAGNOSTIC FINDINGS: Per EMR  CT HEAD CODE STROKE WO CONTRAST   IMPRESSION: 1. No CT evidence of acute intracranial abnormality. 2. Mild chronic microvascular ischemic changes. 3. ASPECTS is 10  Electronically Signed   By: Donnice Mania M.D.   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 Scores from 10 tasks were used to estimate severity ratings (standardized for age groups 18-69 years and 70-89 years) for each domain, a clock drawing task, as well as an overall composite severity rating of cognition.       Task Score Criterion Cut Scores  Personal Facts 8/8 8  Symbol Cancellation 11/12 11  Confrontation Naming 10/10 10  Clock Drawing  8/13 12  Story Retelling 5/10 6  Symbol Trails 1/10 9  Generative Naming 4/9 5  Design Memory 4/6 5  Mazes  0/8 7  Design Generation -/13 6    Cognitive Domain Composite Score Severity Rating  Attention 120/215 Moderate  Memory 130/185 Moderate  Executive Function 7/40 Severe  Language 27/37 Mild  Visuospatial Skills 44/105 Moderate  Clock Drawing  8/13 Moderate  Composite  Severity Rating  Moderate     PATIENT REPORTED OUTCOME MEASURES (PROM): Neuro QOL - Cognition: 73                                                                                                                            TREATMENT DATE:   05/02/24: Pt was seen for skilled ST services targeting cognitive-communication. Pt reports she is concerned about return to work and her being able to manage tasks. She plans to apply for disability in the interim. When attempting to express, pt lost train of thought x3 and had difficulty with word finding.  SLP initiated education on attention strategies re: monitoring fatigue, reducing distractions, providing self extra time, and completing lists to reduce cognitive load. Pt reports she could benefit from monitoring fatigue (spoon  theory) and reducing auditory distractions. To cont with strategies next sesssion.   04/25/24: Pt reports she had a discussion with her supervisor about options for returning to work re: reduced hours. No return-to-work date is set at this time. Pt completed CLQT and Cognitive PROM - see above for scores. SLP reviewed results with patient. Patient is to contact Neuro location, as she prefers later appointments due to transportation. She will call and schedule continued tx at [facility], if she is unable to get an appointment.     PATIENT EDUCATION: Education details: SLP role cognitive-communication Person educated: Patient Education method: Explanation Education comprehension: verbalized understanding and needs further education   GOALS: Goals reviewed with patient? No  SHORT TERM GOALS: Target date: 05/18/24  Complete CLQT, PROM, and update goals to reflect Baseline: Goal status: MET  2.  Pt will recall 2+ word finding strategies to aid instances of anomia Baseline:  Goal status: INITIAL  3.  Pt will verbalize 2 memory strategies to support recall of information in conversations Baseline:  Goal status: INITIAL  4.  Pt will  verbalize 2 attention strategies to improve active listening skills in unstructured conversations. Baseline:  Goal status: INITIAL    LONG TERM GOALS: Target date: 06/17/24  Pt will improve score on PROM Baseline:  Goal status: INITIAL  2.  Pt will demonstrate use of word finding stategies in structured conversation.  Baseline:  Goal status: INITIAL  3.  Pt will report successful use of memory compensations for daily tasks  Baseline:  Goal status: INITIAL  4.  Pt will report successful use of active listening strategies for remaining engaged in conversations with family and friends.  Baseline:  Goal status: INITIAL    ASSESSMENT:  CLINICAL IMPRESSION: Pt is a 58 yo female who presents to ST OP for evaluation post CVA. Pt endorses difficulty with memory - which plays a big role in her job, difficulty finding words, losing train of thought, and feeling slowed down. No hx of anxiety/depression - reports she is feeling more down since CVA due to reduced independence. When asked, she feels like she has difficulty doing multiple things at once. Pt was assessed using CLQT - see above for subtests results. To complete along with PROMs next session. SLP observed slowed processing, need for repetition, and halting anomia in conversation. SLP rec skilled ST services to address cognitive-communication impairment to facilitate return to work.       OBJECTIVE IMPAIRMENTS: include attention, memory, executive functioning, and expressive language. These impairments are limiting patient from return to work, managing medications, managing appointments, managing finances, household responsibilities, and effectively communicating at home and in community. Factors affecting potential to achieve goals and functional outcome are NA.SABRA Patient will benefit from skilled SLP services to address above impairments and improve overall function.  REHAB POTENTIAL: Good  PLAN:  SLP FREQUENCY: 1x/week  SLP  DURATION: 8 weeks  PLANNED INTERVENTIONS: Environmental controls, Cueing hierachy, Cognitive reorganization, Internal/external aids, Functional tasks, SLP instruction and feedback, Compensatory strategies, Patient/family education, and [id number] Treatment of speech (30 or 45 min)     [employer], CCC-SLP 05/02/2024, 10:21 AM

## 2024-05-04 ENCOUNTER — Ambulatory Visit (INDEPENDENT_AMBULATORY_CARE_PROVIDER_SITE_OTHER): Admitting: Neurology

## 2024-05-04 ENCOUNTER — Encounter: Payer: Self-pay | Admitting: Neurology

## 2024-05-04 VITALS — BP 156/88 | HR 78 | Ht 64.0 in | Wt 209.0 lb

## 2024-05-04 DIAGNOSIS — R2990 Unspecified symptoms and signs involving the nervous system: Secondary | ICD-10-CM | POA: Diagnosis not present

## 2024-05-04 DIAGNOSIS — R4189 Other symptoms and signs involving cognitive functions and awareness: Secondary | ICD-10-CM | POA: Diagnosis not present

## 2024-05-04 DIAGNOSIS — R531 Weakness: Secondary | ICD-10-CM | POA: Diagnosis not present

## 2024-05-04 NOTE — Telephone Encounter (Signed)
 Patient just completed her visit and was asking if we still have her paperwork to complete.

## 2024-05-04 NOTE — Progress Notes (Unsigned)
 Chief Complaint  Patient presents with   Hospitalization Follow-up    RM 14 stroke 01/19/24 left side week      ASSESSMENT AND PLAN  Jackie Reed is a 58 y.o. 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   MEDICAL HISTORY:  LYNCOLN MASKELL, seen in request by NP Billy Philippe SAUNDERS, NP  wit her sister-in-Law Elyn at visit  History is obtained from the patient and review of electronic medical records. I personally reviewed pertinent available imaging films in PACS.   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 BP was high over 200/100,  ER,  husband,   TNK,  CTA was ok  MRI cervicla, mild DJD,  Now on Novasc, creator, and asa,   SHe complete PT, ST,   Not 100%, dragging left leg a bit, let hand can do but still slwo,   ST for memory, for stroke liek symptoms,   Evaluation,   Stress, tedious and stressful,   Not required on the phone, still do job, but eveyrbody on the phone, with her meory, she is not able to contineut to do that kind of work,     Job  Infectious Disease physician: #Latent TB #Pulmonary sarcoidosis, followed by pulmonology - = On isoniazid  and B6 x 6 months.  EOT 7/10 pt completed this. - Reviewed labs LFTs on 6/9 stable.  ALP slightly elevated which is chronic. - Methotrexate held till INH completed per patient.  Okay to start methotrexate from an infectious disease perspective when felt appropriate. -Labs today. Pt  has completed latent tb treatment - F/U with OD  PRN  Go on disability,    PHYSICAL EXAM:   Vitals:   05/04/24 1300  BP: (!) 156/88  Pulse: 78  SpO2: 98%  Weight: 209 lb (94.8 kg)  Height: 5' 4 (1.626 m)     Body mass index is 35.87 kg/m.  PHYSICAL EXAMNIATION:  Gen: NAD, conversant, well nourised, well groomed                     Cardiovascular: Regular rate rhythm, no peripheral edema, warm, nontender. Eyes: Conjunctivae clear without exudates or hemorrhage Neck: Supple, no carotid bruits. Pulmonary: Clear to auscultation bilaterally   NEUROLOGICAL EXAM:  MENTAL STATUS: Speech/cognition: Awake, alert, oriented to history taking and casual conversation    05/04/2024    1:00 PM  Montreal Cognitive Assessment   Visuospatial/ Executive (0/5) 3  Naming (0/3) 2  Attention: Read list of digits (0/2) 2  Attention: Read list of letters (0/1) 1  Attention:  Serial 7 subtraction starting at 100 (0/3) 0  Language: Repeat phrase (0/2) 2  Language : Fluency (0/1) 0  Abstraction (0/2) 2  Delayed Recall (0/5) 2  Orientation (0/6) 6  Total 20      CRANIAL NERVES: CN II: Visual fields are full to confrontation. Pupils are round equal and briskly reactive to light. CN III, IV, VI: extraocular movement are normal. No ptosis. CN V: Facial sensation is intact to light touch CN VII: Face is symmetric with normal eye closure  CN VIII: Hearing is normal to causal conversation. CN IX, X: Phonation is normal. CN XI: Head turning and shoulder shrug are intact  MOTOR: Variable effort  on examination of left upper and lower extremity motor strength, felt there was no muscle weakness  REFLEXES: Reflexes are 2+ and symmetric at the biceps, triceps, knees, and ankles. Plantar responses are flexor.  SENSORY: Intact to light touch, pinprick and vibratory sensation are intact in fingers and toes.  COORDINATION: There is no trunk or limb dysmetria noted.  GAIT/STANCE: Pushing object left leg some, variable effort   REVIEW OF SYSTEMS:  Full 14 system review of systems performed and notable only for as above All other review of systems were negative.   ALLERGIES: Allergies  Allergen Reactions   Amoxil  [Amoxicillin ] Shortness Of Breath and Palpitations   Morphine  And Codeine Hives and Rash   Ultram  [Tramadol ] Nausea And Vomiting    HOME MEDICATIONS: Current Outpatient Medications  Medication Sig Dispense Refill   acetaminophen  (TYLENOL ) 325 MG tablet Take 1-2 tablets (325-650 mg total) by mouth every 4 (four) hours as needed for mild pain (pain score 1-3). 30 tablet 0   amLODipine  (NORVASC ) 5 MG tablet Take 1 tablet (5 mg total) by mouth daily. 90 tablet 1   aspirin  EC 81 MG tablet Take 1 tablet (81 mg total) by mouth daily. Swallow whole. 90 tablet 3   baclofen  (LIORESAL ) 10 MG tablet Take 1 tablet (10 mg total) by mouth 3 (three) times daily as needed for muscle spasms. 30 each 0   diclofenac  Sodium (VOLTAREN ) 1 % GEL Apply 2 g topically 4 (four) times daily. 100 g 0   fluticasone  furoate-vilanterol (BREO ELLIPTA ) 100-25 MCG/ACT AEPB Inhale 1 puff into the lungs daily. 1 each 0   hydrochlorothiazide  (HYDRODIURIL ) 25 MG tablet Take 1 tablet (25 mg total) by mouth daily. 90 tablet 1   levalbuterol  (XOPENEX  HFA) 45 MCG/ACT inhaler Prn wheezing, cough  and shortness of breath from a cold 45 g 0   rosuvastatin  (CRESTOR ) 20 MG tablet Take 1 tablet (20 mg total) by mouth daily. 90 tablet 1   senna-docusate (SENOKOT-S) 8.6-50 MG tablet Take 2 tablets by mouth 2 (two) times  daily. 120 tablet 0   No current facility-administered medications for this visit.    PAST MEDICAL HISTORY: Past Medical History:  Diagnosis Date   Adenomatous colon polyp 2017   Dyspnea    occasional   E-coli UTI 08/2019   Hepatitis 05/2020   Hepatitis B core antibody positive   Hypertension    Migraine    Plantar fibromatosis    PONV (postoperative nausea and vomiting)    Pre-diabetes    Sarcoidosis    Stroke (HCC)    TB lung, latent    Thyroid  nodule 01/29/2023   seen on PET scan   Vitamin B12 deficiency 06/2019   Vitamin D  deficiency 06/2019    PAST SURGICAL HISTORY: Past Surgical History:  Procedure Laterality Date   ABDOMINAL  HYSTERECTOMY     FINE NEEDLE ASPIRATION  02/19/2023   Procedure: FINE NEEDLE ASPIRATION (FNA) LINEAR;  Surgeon: Brenna Adine CROME, DO;  Location: [facility];  Service: Cardiopulmonary;;   FOOT SURGERY Bilateral 1985   MASS EXCISION Right 05/19/2018   Procedure: EXCISION PALMAR NODULES RIGHT HAND;  Surgeon: Murrell Drivers, MD;  Location: [facility];  Service: Orthopedics;  Laterality: Right;   PARTIAL HYSTERECTOMY  1997   VIDEO BRONCHOSCOPY WITH ENDOBRONCHIAL ULTRASOUND Bilateral 02/19/2023   Procedure: VIDEO BRONCHOSCOPY WITH ENDOBRONCHIAL ULTRASOUND;  Surgeon: Brenna Adine CROME, DO;  Location: [facility];  Service: Cardiopulmonary;  Laterality: Bilateral;    FAMILY HISTORY: Family History  Problem Relation Age of Onset   Heart failure Mother    Diabetes Mother    Heart failure Father    Hypertension Father    Hyperlipidemia Father    Colon cancer Neg Hx    Colon polyps Neg Hx    Breast cancer Neg Hx    Crohn's disease Neg Hx    Esophageal cancer Neg Hx    Rectal cancer Neg Hx    Stomach cancer Neg Hx    Ulcerative colitis Neg Hx     SOCIAL HISTORY: Social History   Socioeconomic History   Marital status: Married    Spouse name: Not on file   Number of children: 2   Years of education: Not on file   Highest  education level: High school graduate  Occupational History   Not on file  Tobacco Use   Smoking status: Never   Smokeless tobacco: Never  Vaping Use   Vaping status: Never Used  Substance and Sexual Activity   Alcohol use: No    Alcohol/week: 0.0 standard drinks of alcohol   Drug use: No   Sexual activity: Yes    Birth control/protection: Surgical    Comment: Hysterectomy  Other Topics Concern   Not on file  Social History Narrative   Left handed   Lives at home with husband    Social Drivers of Health   Financial Resource Strain: Low Risk  (09/28/2023)   Received from [facility]   Overall Financial Resource Strain (CARDIA)    Difficulty of Paying Living Expenses: Not very hard  Food Insecurity: No Food Insecurity (01/19/2024)   Hunger Vital Sign    Worried About Running Out of Food in the Last Year: Never true    Ran Out of Food in the Last Year: Never true  Transportation Needs: No Transportation Needs (01/19/2024)   PRAPARE - Administrator, Civil Service (Medical): No    Lack of Transportation (Non-Medical): No  Physical Activity: Insufficiently Active (03/20/2024)   Exercise Vital Sign    Days of Exercise per Week: 2 days    Minutes of Exercise per Session: 40 min  Stress: No Stress Concern Present (03/20/2024)   [employer] of Occupational Health - Occupational Stress Questionnaire    Feeling of Stress: Not at all  Social Connections: Unknown (12/22/2021)   Received from [facility]   Social Network    Social Network: Not on file  Intimate Partner Violence: Not At Risk (01/19/2024)   Humiliation, Afraid, Rape, and Kick questionnaire    Fear of Current or Ex-Partner: No    Emotionally Abused: No    Physically Abused: No    Sexually Abused: No      Modena Callander, M.D. Ph.D.  [facility] [address], [address] [place], [place] [address] Ph: [phone]  Fax: [phone]  CC:  Maurice Sharlet RAMAN, PA-C [address] [place],  [place] [address]  Billy Philippe SAUNDERS, NP

## 2024-05-06 LAB — RPR, QUANT+TP ABS (REFLEX)
Rapid Plasma Reagin, Quant: 1:2 {titer} — ABNORMAL HIGH
T Pallidum Abs: REACTIVE — AB

## 2024-05-06 LAB — TSH: TSH: 0.463 u[IU]/mL (ref 0.450–4.500)

## 2024-05-06 LAB — VITAMIN B12: Vitamin B-12: 410 pg/mL (ref 232–1245)

## 2024-05-06 LAB — RPR: RPR Ser Ql: REACTIVE — AB

## 2024-05-09 ENCOUNTER — Encounter: Payer: Self-pay | Admitting: Physical Medicine and Rehabilitation

## 2024-05-09 ENCOUNTER — Telehealth: Payer: Self-pay | Admitting: Neurology

## 2024-05-09 ENCOUNTER — Encounter: Attending: Physical Medicine and Rehabilitation | Admitting: Physical Medicine and Rehabilitation

## 2024-05-09 ENCOUNTER — Encounter: Admitting: Speech Pathology

## 2024-05-09 VITALS — BP 124/72 | HR 81 | Ht 64.0 in | Wt 211.0 lb

## 2024-05-09 DIAGNOSIS — R899 Unspecified abnormal finding in specimens from other organs, systems and tissues: Secondary | ICD-10-CM | POA: Insufficient documentation

## 2024-05-09 DIAGNOSIS — I10 Essential (primary) hypertension: Secondary | ICD-10-CM | POA: Diagnosis not present

## 2024-05-09 DIAGNOSIS — I639 Cerebral infarction, unspecified: Secondary | ICD-10-CM | POA: Insufficient documentation

## 2024-05-09 DIAGNOSIS — R4189 Other symptoms and signs involving cognitive functions and awareness: Secondary | ICD-10-CM

## 2024-05-09 NOTE — Telephone Encounter (Signed)
   I called patient for positive RPR, with titer 1:2, that was confirmed by positive T. pallidum antibody, the lab was ordered for her complaints of cognitive impairment, she denies a history of sexual transmitted disease,  She was seen by infectious disease Dr.Singh, [place] in July for latent TB, just finished 6 months of isoniazid    treatment,  I have suggested potential lumbar puncture for further evaluation, she preferred to be referred back to infectious disease for second opinion  Orders Placed This Encounter  Procedures   Ambulatory referral to Infectious Disease         Component Ref Range & Units (hover) 5 d ago  Rapid Plasma Reagin, Quant 1:2 High   T Pallidum Abs Reactive Abnormal   Interpretation: Comment

## 2024-05-09 NOTE — Progress Notes (Signed)
 Subjective:    Patient ID: Jackie Reed, female    DOB: [birth year], 58 y.o.   MRN: [id number]  HPI Jackie Reed is a 58 year old woman who presents for hospital follow-up.  1) CVA Graduated from PT and OT -continues SLP for her memory -she is having a hard time remembering daily things and things for work- she is concerned about this -she is a scheduled for [employer] -graduated from [employer] -no falls -still experiencing left sided weakness -she is working on dragging her foot less  2) HTN: -continues to be well controlled BP is 124/72   Pain Inventory Average Pain 0 Pain Right Now 0 My pain is No pain  LOCATION OF PAIN  No pain  BOWEL Number of stools per week: 2 Oral laxative use Senokot  BLADDER Normal   Mobility walk with assistance use a walker how many minutes can you walk? 10-15 mins ability to climb steps?  yes do you drive?  no Do you have any goals in this area?  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  yes  Neuro/Psych trouble walking  Prior Studies Any changes since last visit?  no  Physicians involved in your care Any changes since last visit?  no   Family History  Problem Relation Age of Onset   Heart failure Mother    Diabetes Mother    Heart failure Father    Hypertension Father    Hyperlipidemia Father    Colon cancer Neg Hx    Colon polyps Neg Hx    Breast cancer Neg Hx    Crohn's disease Neg Hx    Esophageal cancer Neg Hx    Rectal cancer Neg Hx    Stomach cancer Neg Hx    Ulcerative colitis Neg Hx    Social History   Socioeconomic History   Marital status: Married    Spouse name: Not on file   Number of children: 2   Years of education: Not on file   Highest education level: High school graduate  Occupational History   Not on file  Tobacco Use   Smoking status: Never    Smokeless tobacco: Never  Vaping Use   Vaping status: Never Used  Substance and Sexual Activity   Alcohol use: No    Alcohol/week: 0.0 standard drinks of alcohol   Drug use: No   Sexual activity: Yes    Birth control/protection: Surgical    Comment: Hysterectomy  Other Topics Concern   Not on file  Social History Narrative   Left handed   Lives at home with husband    Social Drivers of Health   Financial Resource Strain: Low Risk  (09/28/2023)   Received from [facility]   Overall Financial Resource Strain (CARDIA)    Difficulty of Paying Living Expenses: Not very hard  Food Insecurity: No Food Insecurity (01/19/2024)   Hunger Vital Sign    Worried About Running Out of Food in the Last Year: Never true    Ran Out of Food in the Last Year: Never true  Transportation Needs: No Transportation Needs (01/19/2024)   PRAPARE - Administrator, Civil Service (Medical): No    Lack of Transportation (Non-Medical): No  Physical Activity: Insufficiently Active (03/20/2024)   Exercise Vital Sign    Days of Exercise per Week: 2  days    Minutes of Exercise per Session: 40 min  Stress: No Stress Concern Present (03/20/2024)   [employer] of Occupational Health - Occupational Stress Questionnaire    Feeling of Stress: Not at all  Social Connections: Unknown (12/22/2021)   [facility]   Social Network    Social Network: Not on file   Past Surgical History:  Procedure Laterality Date   ABDOMINAL HYSTERECTOMY     FINE NEEDLE ASPIRATION  02/19/2023   Procedure: FINE NEEDLE ASPIRATION (FNA) LINEAR;  Surgeon: Brenna Adine CROME, DO;  Location: [facility];  Service: Cardiopulmonary;;   FOOT SURGERY Bilateral 1985   MASS EXCISION Right 05/19/2018   Procedure: EXCISION PALMAR NODULES RIGHT HAND;  Surgeon: Murrell Drivers, MD;  Location: [facility];  Service: Orthopedics;  Laterality: Right;   PARTIAL HYSTERECTOMY  1997   VIDEO BRONCHOSCOPY WITH  ENDOBRONCHIAL ULTRASOUND Bilateral 02/19/2023   Procedure: VIDEO BRONCHOSCOPY WITH ENDOBRONCHIAL ULTRASOUND;  Surgeon: Brenna Adine CROME, DO;  Location: [facility];  Service: Cardiopulmonary;  Laterality: Bilateral;   Past Medical History:  Diagnosis Date   Adenomatous colon polyp 2017   Dyspnea    occasional   E-coli UTI 08/2019   Hepatitis 05/2020   Hepatitis B core antibody positive   Hypertension    Migraine    Plantar fibromatosis    PONV (postoperative nausea and vomiting)    Pre-diabetes    Sarcoidosis    Stroke (HCC)    TB lung, latent    Thyroid  nodule 01/29/2023   seen on PET scan   Vitamin B12 deficiency 06/2019   Vitamin D  deficiency 06/2019   There were no vitals taken for this visit.  Opioid Risk Score:   Fall Risk Score:  `1  Depression screen [facility] 2/9     03/06/2024    3:35 PM 08/27/2023    9:06 AM 05/27/2020   11:09 AM 09/05/2019    3:41 PM 05/15/2019    3:34 PM 03/07/2019    2:22 PM 12/30/2018   10:39 AM  Depression screen PHQ 2/9  Decreased Interest 0 0 0 0 0 0 0  Down, Depressed, Hopeless 0 0 0 0 0 0 0  PHQ - 2 Score 0 0 0 0 0 0 0  Altered sleeping 0        Tired, decreased energy 0        Change in appetite 0        Feeling bad or failure about yourself  0        Trouble concentrating 0        Moving slowly or fidgety/restless 0        Suicidal thoughts 0        PHQ-9 Score 0          Review of Systems  Musculoskeletal:  Positive for gait problem.  Neurological:  Negative for weakness.  All other systems reviewed and are negative.      Objective:   Physical Exam Gen: no distress, normal appearing HEENT: oral mucosa pink and moist, NCAT Cardio: Reg rate Chest: normal effort, normal rate of breathing Abd: soft, non-distended Ext: no edema Psych: pleasant, normal affect Skin: intact Neuro: Alert and oriented x3, left sided weakness, word finding difficulties     Assessment & Plan:   1) CVA:  -continue aspirin  81 mg daily  -handicap  placard provided -continue PT and OT -discussed that I can help her with her short term disability paperwork -continue SLP -  provided her a note for work with accommodations requested -discussed how her memory deficits are impacting her work -discussed that there is no option for her to be off the phones for work -discussed that her supervisor is able to adjust her hours to 8 to 3, encouraged requesting 4 day per week schedule -discussed that she feels overwhelmed by her work and she feels that this is what contributed to her current CVA  2) HTN: BP is 124/72 today -recommended decreasing amlodipine  to 5mg  every other day -Advised checking BP daily at home and logging results to bring into follow-up appointment with PCP and myself. -Reviewed BP meds today.  -Advised regarding healthy foods that can help lower blood pressure and provided with a list: 1) citrus foods- high in vitamins and minerals 2) salmon and other fatty fish - reduces inflammation and oxylipins 3) swiss chard (leafy green)- high level of nitrates 4) pumpkin seeds- one of the best natural sources of magnesium  5) Beans and lentils- high in fiber, magnesium , and potassium 6) Berries- high in flavonoids 7) Amaranth (whole grain, can be cooked similarly to rice and oats)- high in magnesium  and fiber 8) Pistachios- even more effective at reducing BP than other nuts 9) Carrots- high in phenolic compounds that relax blood vessels and reduce inflammation 10) Celery- contain phthalides that relax tissues of arterial walls 11) Tomatoes- can also improve cholesterol and reduce risk of heart disease 12) Broccoli- good source of magnesium , calcium , and potassium 13) Greek yogurt: high in potassium and calcium  14) Herbs and spices: Celery seed, cilantro, saffron, lemongrass, black cumin, ginseng, cinnamon, cardamom, sweet basil, and ginger 15) Chia and flax seeds- also help to lower cholesterol and blood sugar 16) Beets- high levels  of nitrates that relax blood vessels  17) spinach and bananas- high in potassium  -Provided lise of supplements that can help with hypertension:  1) magnesium : one high quality brand is Bioptemizers since it contains all 7 types of magnesium , otherwise over the counter magnesium  gluconate 400mg  is a good option 2) B vitamins 3) vitamin D  4) potassium 5) CoQ10 6) L-arginine 7) Vitamin C 8) Beetroot -Educated that goal BP is 120/80. -Made goal to incorporate some of the above foods into diet.    3) Constipation:  -Provided list of following foods that help with constipation and highlighted a few: 1) prunes- contain high amounts of fiber.  2) apples- has a form of dietary fiber called pectin that accelerates stool movement and increases beneficial gut bacteria 3) pears- in addition to fiber, also high in fructose and sorbitol  which have laxative effect 4) figs- contain an enzyme ficin which helps to speed colonic transit 5) kiwis- contain an enzyme actinidin that improves gut motility and reduces constipation 6) oranges- rich in pectin (like apples) 7) grapefruits- contain a flavanol naringenin which has a laxative effect 8) vegetables- rich in fiber and also great sources of folate, vitamin C, and K 9) artichoke- high in inulin, prebiotic great for the microbiome 10) chicory- increases stool frequency and softness (can be added to coffee) 11) rhubarb- laxative effect 12) sweet potato- high fiber 13) beans, peas, and lentils- contain both soluble and insoluble fiber 14) chia seeds- improves intestinal health and gut flora 15) flaxseeds- laxative effect 16) whole grain rye bread- high in fiber 17) oat bran- high in soluble and insoluble fiber 18) kefir- softens stools -recommended to try at least one of these foods every day.  -drink 6-8 glasses of water per day -walk regularly,  especially after meals.   4) UPJ stenosis: -discussed that urology will follow-up with her on August  1st -discussed that she sometimes has muscle spasms and baclofen  helps

## 2024-05-09 NOTE — Patient Instructions (Signed)
 HTN: Advised checking BP daily at home and logging results to bring into follow-up appointment with PCP and myself. -Reviewed BP meds today.  -Advised regarding healthy foods that can help lower blood pressure and provided with a list: 1) citrus foods- high in vitamins and minerals 2) salmon and other fatty fish - reduces inflammation and oxylipins 3) swiss chard (leafy green)- high level of nitrates 4) pumpkin seeds- one of the best natural sources of magnesium 5) Beans and lentils- high in fiber, magnesium, and potassium 6) Berries- high in flavonoids 7) Amaranth (whole grain, can be cooked similarly to rice and oats)- high in magnesium and fiber 8) Pistachios- even more effective at reducing BP than other nuts 9) Carrots- high in phenolic compounds that relax blood vessels and reduce inflammation 10) Celery- contain phthalides that relax tissues of arterial walls 11) Tomatoes- can also improve cholesterol and reduce risk of heart disease 12) Broccoli- good source of magnesium, calcium, and potassium 13) Greek yogurt: high in potassium and calcium 14) Herbs and spices: Celery seed, cilantro, saffron, lemongrass, black cumin, ginseng, cinnamon, cardamom, sweet basil, and ginger 15) Chia and flax seeds- also help to lower cholesterol and blood sugar 16) Beets- high levels of nitrates that relax blood vessels  17) spinach and bananas- high in potassium  -Provided lise of supplements that can help with hypertension:  1) magnesium: one high quality brand is Bioptemizers since it contains all 7 types of magnesium, otherwise over the counter magnesium gluconate 400mg  is a good option 2) B vitamins 3) vitamin D 4) potassium 5) CoQ10 6) L-arginine 7) Vitamin C 8) Beetroot -Educated that goal BP is 120/80. -Made goal to incorporate some of the above foods into diet.

## 2024-05-10 ENCOUNTER — Ambulatory Visit (HOSPITAL_BASED_OUTPATIENT_CLINIC_OR_DEPARTMENT_OTHER): Admitting: Pulmonary Disease

## 2024-05-10 ENCOUNTER — Encounter: Payer: Self-pay | Admitting: Physical Medicine and Rehabilitation

## 2024-05-11 ENCOUNTER — Encounter: Payer: Self-pay | Admitting: Physical Medicine and Rehabilitation

## 2024-05-15 ENCOUNTER — Ambulatory Visit: Admitting: Pulmonary Disease

## 2024-05-15 ENCOUNTER — Encounter: Payer: Self-pay | Admitting: Physical Medicine and Rehabilitation

## 2024-05-15 DIAGNOSIS — D869 Sarcoidosis, unspecified: Secondary | ICD-10-CM | POA: Diagnosis not present

## 2024-05-15 NOTE — Progress Notes (Signed)
 Full PFT performed today.

## 2024-05-15 NOTE — Patient Instructions (Signed)
 Full PFT performed today.

## 2024-05-16 ENCOUNTER — Encounter: Admitting: Speech Pathology

## 2024-05-16 ENCOUNTER — Encounter: Payer: Self-pay | Admitting: Physical Medicine and Rehabilitation

## 2024-05-19 ENCOUNTER — Ambulatory Visit: Admitting: Speech Pathology

## 2024-05-22 ENCOUNTER — Ambulatory Visit: Admitting: Speech Pathology

## 2024-05-23 ENCOUNTER — Encounter (HOSPITAL_BASED_OUTPATIENT_CLINIC_OR_DEPARTMENT_OTHER): Payer: Self-pay | Admitting: Pulmonary Disease

## 2024-05-23 ENCOUNTER — Encounter: Admitting: Speech Pathology

## 2024-05-23 ENCOUNTER — Ambulatory Visit (HOSPITAL_BASED_OUTPATIENT_CLINIC_OR_DEPARTMENT_OTHER): Admitting: Pulmonary Disease

## 2024-05-23 VITALS — BP 121/70 | HR 71 | Ht 64.0 in | Wt 212.9 lb

## 2024-05-23 DIAGNOSIS — D869 Sarcoidosis, unspecified: Secondary | ICD-10-CM | POA: Diagnosis not present

## 2024-05-23 MED ORDER — FLUTICASONE FUROATE-VILANTEROL 100-25 MCG/ACT IN AEPB: 1.0000 | INHALATION_SPRAY | Freq: Every day | RESPIRATORY_TRACT | 5 refills | Status: AC

## 2024-05-23 NOTE — Progress Notes (Addendum)
 Subjective:   PATIENT ID: Jackie Reed [place]ute GENDER: female DOB: 03-26-1966, MRN: [id number]  Chief Complaint  Patient presents with   Follow-up    Sarcoidosis    Reason for Visit: Follow-up  [name] is a 58 year old never smoker female with sarcoid, HTN, migraines who presents for follow-up for sarcoid management.  Initial consult She was initially seen by Dr. Brenna for abnormal PET scan for bulky adenopathy. She underwent EBUS on 02/19/23 with sampling station 7 and right mainstem endobronchial biopsies consistent with sarcoidosis. She was seen by Dr. Annella on 04/01/23 for acute visit for shortness of breath and cough. She was prescribed Breo and albuterol  which she takes nightly and helps with her shortness of breath. Coughing is unchanged and starting to have chest pain. Cough has improved by 50% with Breo. Shortness of breath worsened with activity. Fatigue with activity but improves with rest. Denies fever, fatigue, weight loss, visual disturbance, palpitations, abdominal pain, numbness, tingling, lightheadedness, syncope.  07/28/23 She is almost completed with the prednisone  taper. Compliant with Breo. Denies shortness of breath cough or wheezing. Some SOB with exertion but inhaler has improved her symptoms.  05/23/24 Since our last visit she was admitted 01/19/24-01/28/24 for stroke s/p TNK with resultant left hemiparesis. Was in inpatient and outpatient rehab. She has recovered most of her function. She most recently was seen on 04/22/24 and diagnosed with URI and treated with nebs and Breo. Reports shortness of breath improved but persists with activity with occasional wheezing. Compliant with Breo and uses albuterol  0-1 times a week on average. Of note, she has completed her INH for latent TB and cleared to start methotrexate from and ID standpoint  Social History: Never smoker  Past Medical History:  Diagnosis Date   Adenomatous colon polyp 2017   Dyspnea    occasional    E-coli UTI 08/2019   Hepatitis 05/2020   Hepatitis B core antibody positive   Hypertension    Migraine    Plantar fibromatosis    PONV (postoperative nausea and vomiting)    Pre-diabetes    Sarcoidosis    Stroke (HCC)    TB lung, latent    Thyroid  nodule 01/29/2023   seen on PET scan   Vitamin B12 deficiency 06/2019   Vitamin D  deficiency 06/2019     Family History  Problem Relation Age of Onset   Heart failure Mother    Diabetes Mother    Heart failure Father    Hypertension Father    Hyperlipidemia Father    Colon cancer Neg Hx    Colon polyps Neg Hx    Breast cancer Neg Hx    Crohn's disease Neg Hx    Esophageal cancer Neg Hx    Rectal cancer Neg Hx    Stomach cancer Neg Hx    Ulcerative colitis Neg Hx      Social History   Occupational History   Not on file  Tobacco Use   Smoking status: Never   Smokeless tobacco: Never  Vaping Use   Vaping status: Never Used  Substance and Sexual Activity   Alcohol use: No    Alcohol/week: 0.0 standard drinks of alcohol   Drug use: No   Sexual activity: Yes    Birth control/protection: Surgical    Comment: Hysterectomy    Allergies  Allergen Reactions   Amoxicillin  Palpitations and Shortness Of Breath    Other Reaction(s): Unknown   Morphine      Other Reaction(s): Unknown  Morphine  And Codeine Hives and Rash   Tramadol  Nausea And Vomiting    Other Reaction(s): Unknown     Outpatient Medications Prior to Visit  Medication Sig Dispense Refill   acetaminophen  (TYLENOL ) 325 MG tablet Take 1-2 tablets (325-650 mg total) by mouth every 4 (four) hours as needed for mild pain (pain score 1-3). 30 tablet 0   amLODipine  (NORVASC ) 5 MG tablet Take 1 tablet (5 mg total) by mouth daily. 90 tablet 1   aspirin  EC 81 MG tablet Take 1 tablet (81 mg total) by mouth daily. Swallow whole. 90 tablet 3   baclofen  (LIORESAL ) 10 MG tablet Take 1 tablet (10 mg total) by mouth 3 (three) times daily as needed for muscle spasms. 30 each  0   diclofenac  Sodium (VOLTAREN ) 1 % GEL Apply 2 g topically 4 (four) times daily. 100 g 0   fluticasone  furoate-vilanterol (BREO ELLIPTA ) 100-25 MCG/ACT AEPB Inhale 1 puff into the lungs daily. 1 each 0   hydrochlorothiazide  (HYDRODIURIL ) 25 MG tablet Take 1 tablet (25 mg total) by mouth daily. 90 tablet 1   levalbuterol  (XOPENEX  HFA) 45 MCG/ACT inhaler Prn wheezing, cough  and shortness of breath from a cold 45 g 0   rosuvastatin  (CRESTOR ) 20 MG tablet Take 1 tablet (20 mg total) by mouth daily. 90 tablet 1   senna-docusate (SENOKOT-S) 8.6-50 MG tablet Take 2 tablets by mouth 2 (two) times daily. 120 tablet 0   No facility-administered medications prior to visit.    Review of Systems  Constitutional:  Negative for chills, diaphoresis, fever, malaise/fatigue and weight loss.  HENT:  Negative for congestion.   Respiratory:  Positive for shortness of breath and wheezing. Negative for cough, hemoptysis and sputum production.   Cardiovascular:  Negative for chest pain, palpitations and leg swelling.     Objective:   Vitals:   05/23/24 1443  BP: 121/70  Pulse: 71  SpO2: 97%  Weight: 212 lb 14.4 oz (96.6 kg)  Height: 5' 4 (1.626 m)   SpO2: 97 %  Physical Exam: General: Well-appearing, no acute distress HENT: [place], AT Eyes: EOMI, no scleral icterus Respiratory: Clear to auscultation bilaterally.  No crackles, wheezing or rales Cardiovascular: RRR, -M/R/G, no JVD Extremities:-Edema,-tenderness Neuro: AAO x4, CNII-XII grossly intact Psych: Normal mood, normal affect  Data Reviewed:  Imaging: CTA 01/18/23 - Neg for PE. Bulky right hilar and mediastinal lymphadenopathy. Interlobular septal thickening right right lung base. Multiple small pulmonary nodules in the right lower lobe PET/CT 01/29/23 - Hypermetabolic central right lower lobe mass with multiple hypermetabolic mediastinal, bilateral hilar and right supraclavicular lympho nodes. Two focal areas of hypermetabolic activity in  subpleural RLLL. Small hypermetabolic lymph nodules in upper abdomen. Hypermetabolic right thyroid  CT Chest 06/21/23 - Interval development of perilymphatic nodularity. Unchanged mediastinal and hilar lymphadenopathy  PFT: 03/17/23 FVC 2.12 (63%) FEV1 1.79 (68%) Ratio 78  TLC 112% DLCO 75% Interpretation: Reduced FVC and FEV1 however normal spirometry and TLC. No obstructive or restrictive defect presents.  05/15/24 FVC 2.25 (72%) FEV1 1.96 (81%) Ratio 87  TLC 86% DLCO 71% Interpretation: No obstructive or restrict defect present. Unchanged mildly reduced DLCO   Labs:    Latest Ref Rng & Units 03/21/2024    3:39 PM 01/31/2024    5:18 AM 01/29/2024    4:55 AM  CBC  WBC 3.8 - 10.8 Thousand/uL 4.7  6.2  5.8   Hemoglobin 11.7 - 15.5 g/dL 88.4  88.0  88.1   Hematocrit 35.0 - 45.0 % 38.4  38.4  38.4   Platelets 140 - 400 Thousand/uL 349  412  394       Latest Ref Rng & Units 03/21/2024    3:39 PM 02/07/2024   12:59 PM 02/04/2024    8:52 PM  BMP  Glucose 65 - 99 mg/dL 885  92  877   BUN 7 - 25 mg/dL 12  24  37   Creatinine 0.50 - 1.03 mg/dL 9.22  9.06  8.50   BUN/Creat Ratio 6 - 22 (calc) SEE NOTE:     Sodium 135 - 146 mmol/L 139  138  135   Potassium 3.5 - 5.3 mmol/L 3.4  3.8  4.0   Chloride 98 - 110 mmol/L 103  103  104   CO2 20 - 32 mmol/L 26  25  20    Calcium  8.6 - 10.4 mg/dL 9.8  89.8  9.7       Latest Ref Rng & Units 03/21/2024    3:39 PM 01/31/2024    5:18 AM 01/29/2024    4:55 AM  Hepatic Function  Total Protein 6.1 - 8.1 g/dL 7.6  8.0  7.7   Albumin 3.5 - 5.0 g/dL  3.4  3.3   AST 10 - 35 U/L 27  37  44   ALT 6 - 29 U/L 14  21  19    Alk Phosphatase 38 - 126 U/L  147  153   Total Bilirubin 0.2 - 1.2 mg/dL 0.4  0.7  0.6   Bilirubin, Direct 0.0 - 0.2 mg/dL  <9.8    Mildly elevated AP   Pathology A. LUNG, RIGHT MAINSTEM, ENDOBRONCHIAL BIOPSY:  Benign bronchial mucosa with acute and chronic inflammation  Negative for alveolated lung  Negative for tumor and granulomas    STATION 7 FINAL MICROSCOPIC DIAGNOSIS:  - No malignant cells identified  - Consistent with a lymph node with granulomatous inflammation and  necrosis  - Acid-fast bacilli and fungal stains negative     Assessment & Plan:   Discussion: 58 year old never smoker female with sarcoid, CVA with left sided hemiparesis 2025, HTN, migraines who presents for sarcoid management. Previously planned to start immunosuppression based on CT 05/2023 concerning for progressive sarcoid however hx of latent TB requirement treatment before initiation of methotrexate. Completed treatment for INH in 02/2024. Will re-evaluate sarcoid with repeat CT imaging to determine if immunosuppressants still indicated.  Pulmonary sarcoidosis Cough  --Dx in 01/2023 via endobronchial bx --CONTINUE Breo 100 ONE puff ONCE a day     Addendum: cough syrup ordered on 06/14/24  History of immunosuppression High risk medication management --04/22/23-07/29/23 Prednisone  20 mg x 6 weeks, 10 mg x 6 weeks --Prior CT scan with increased pulmonary sarcoid involvement and we had discussed starting methotrexate but this was on hold until latent TB treatment was completed in 02/2024. --Repeat CT Chest when next available --Will consider initiation of methotrexate pending results. May hold on treatment if stable or improved  >Will need labs prior to starting immunosuppression  Sarcoid Monitoring --Recent chest imaging reviewed.  --Annual PFTs.  Last PFTs 05/23/24 --Annual ophthalmology exam.   --Recent EKG and TTE reviewed 12/2023. No evidence of conduction abnormalities. --Routine labs as needed: CBC with diff, CMET, 1, 25 and 25 hydroxy vitamin D , urinary calcium   Latent TB Previously exposed to someone with TB in her teens. She was seen by health dept but determined not to need to be treated at the time since no concern for active TB. S/p INH x  6 months. Completed 02/2024 Ok to start immunosuppressants per ID  Left sided weakness 2/2  CVA s/p TNK on 01/19/24 Completed rehab in June 2025  Health Maintenance Immunization History  Administered Date(s) Administered   Influenza,inj,Quad PF,6+ Mos 06/17/2022   Influenza-Unspecified 06/09/2021, 06/17/2022   PFIZER Comirnaty(Gray Top)Covid-19 Tri-Sucrose Vaccine 09/28/2020   PFIZER(Purple Top)SARS-COV-2 Vaccination 12/09/2019, 12/30/2019   PPD Test 05/28/2017   CT Lung Screen - not qualified  Orders Placed This Encounter  Procedures   CT Chest Wo Contrast    Standing Status:   Future    Expiration Date:   05/23/2025    Scheduling Instructions:     When next available    Is patient pregnant?:   No    Preferred imaging location?:   [facility]   Meds ordered this encounter  Medications   fluticasone  furoate-vilanterol (BREO ELLIPTA ) 100-25 MCG/ACT AEPB    Sig: Inhale 1 puff into the lungs daily.    Dispense:  60 each    Refill:  5    Brand name    Return in about 4 months (around 09/22/2024).  I have spent a total time of 41-minutes on the day of the appointment including chart review, data review, collecting history, coordinating care and discussing medical diagnosis and plan with the patient/family. Past medical history, allergies, medications were reviewed. Pertinent imaging, labs and tests included in this note have been reviewed and interpreted independently by me.  Azelia Reiger Slater Staff, MD [facility] 05/23/2024 2:50 PM  Office Number [phone]

## 2024-05-23 NOTE — Patient Instructions (Signed)
 Sarcoid --Prior CT scan with increased pulmonary sarcoid involvement and we had discussed starting methotrexate but this was on hold until latent TB treatment was completed in 02/2024. --Repeat CT Chest when next available --Will consider initiation of methotrexate pending results. May hold on treatment if stable or improved

## 2024-05-29 ENCOUNTER — Ambulatory Visit: Admitting: Speech Pathology

## 2024-05-30 ENCOUNTER — Ambulatory Visit: Admitting: Internal Medicine

## 2024-06-07 ENCOUNTER — Ambulatory Visit (HOSPITAL_BASED_OUTPATIENT_CLINIC_OR_DEPARTMENT_OTHER)
Admission: RE | Admit: 2024-06-07 | Discharge: 2024-06-07 | Disposition: A | Discharge status: Home / Self Care | Source: Ambulatory Visit | Attending: Pulmonary Disease | Admitting: Pulmonary Disease

## 2024-06-07 DIAGNOSIS — D869 Sarcoidosis, unspecified: Secondary | ICD-10-CM | POA: Insufficient documentation

## 2024-06-12 ENCOUNTER — Ambulatory Visit: Admitting: Speech Pathology

## 2024-06-13 NOTE — Telephone Encounter (Signed)
 Patient returned call and adjustment to appt made per previous notation.

## 2024-06-14 ENCOUNTER — Ambulatory Visit: Payer: Self-pay | Admitting: Pulmonary Disease

## 2024-06-14 MED ORDER — PROMETHAZINE-DM 6.25-15 MG/5ML PO SYRP: 5.0000 mL | ORAL_SOLUTION | Freq: Four times a day (QID) | ORAL | 0 refills | Status: AC | PRN

## 2024-06-14 NOTE — Addendum Note (Signed)
 Addended by: KASSIE ACQUANETTA BRADLEY on: 06/14/2024 05:31 PM   Modules accepted: Orders

## 2024-06-15 LAB — PULMONARY FUNCTION TEST
DL/VA % pred: 98 %
DL/VA: 4.23 ml/min/mmHg/L
DLCO cor % pred: 71 %
DLCO cor: 13.55 ml/min/mmHg
DLCO unc % pred: 71 %
DLCO unc: 13.55 ml/min/mmHg
FEF 25-75 Post: 2.38 L/s
FEF 25-75 Pre: 2.59 L/s
FEF2575-%Change-Post: -8 %
FEF2575-%Pred-Post: 104 %
FEF2575-%Pred-Pre: 113 %
FEV1-%Change-Post: -6 %
FEV1-%Pred-Post: 81 %
FEV1-%Pred-Pre: 87 %
FEV1-Post: 1.96 L
FEV1-Pre: 2.1 L
FEV1FVC-%Change-Post: 0 %
FEV1FVC-%Pred-Pre: 110 %
FEV6-%Change-Post: -7 %
FEV6-%Pred-Post: 75 %
FEV6-%Pred-Pre: 81 %
FEV6-Post: 2.25 L
FEV6-Pre: 2.42 L
FEV6FVC-%Change-Post: 0 %
FEV6FVC-%Pred-Post: 103 %
FEV6FVC-%Pred-Pre: 103 %
FVC-%Change-Post: -7 %
FVC-%Pred-Post: 72 %
FVC-%Pred-Pre: 78 %
FVC-Post: 2.25 L
Post FEV1/FVC ratio: 87 %
Post FEV6/FVC ratio: 100 %
Pre FEV1/FVC ratio: 87 %
Pre FEV6/FVC Ratio: 100 %
RV % pred: 89 %
RV: 1.67 L
TLC % pred: 86 %
TLC: 4.11 L

## 2024-06-19 ENCOUNTER — Encounter: Admitting: Speech Pathology

## 2024-06-20 ENCOUNTER — Ambulatory Visit: Admitting: Family Medicine

## 2024-06-23 ENCOUNTER — Encounter: Payer: Self-pay | Admitting: Family Medicine

## 2024-06-23 ENCOUNTER — Ambulatory Visit: Admitting: Family Medicine

## 2024-06-23 ENCOUNTER — Ambulatory Visit (INDEPENDENT_AMBULATORY_CARE_PROVIDER_SITE_OTHER): Admitting: Family Medicine

## 2024-06-23 VITALS — BP 118/80 | HR 75 | Temp 97.9°F | Ht 64.0 in | Wt 215.0 lb

## 2024-06-23 DIAGNOSIS — M47816 Spondylosis without myelopathy or radiculopathy, lumbar region: Secondary | ICD-10-CM | POA: Diagnosis not present

## 2024-06-23 DIAGNOSIS — E785 Hyperlipidemia, unspecified: Secondary | ICD-10-CM

## 2024-06-23 DIAGNOSIS — I10 Essential (primary) hypertension: Secondary | ICD-10-CM | POA: Diagnosis not present

## 2024-06-23 MED ORDER — AMLODIPINE BESYLATE 5 MG PO TABS
5.0000 mg | ORAL_TABLET | Freq: Every day | ORAL | 1 refills | Status: AC
Start: 2024-06-23 — End: ?

## 2024-06-23 MED ORDER — HYDROCHLOROTHIAZIDE 25 MG PO TABS: 25.0000 mg | ORAL_TABLET | Freq: Every day | ORAL | 1 refills | Status: AC

## 2024-06-23 MED ORDER — ROSUVASTATIN CALCIUM 20 MG PO TABS: 20.0000 mg | ORAL_TABLET | Freq: Every day | ORAL | 1 refills | Status: AC

## 2024-06-23 NOTE — Patient Instructions (Signed)
-  It was great to see you today. -Continue all medication. Refilled Amlodipine , Hydrochlorothiazide , and Rosuvastatin .  -Ordered labs. Please make a lab appointment to be fasting. -Please schedule an appointment with GI to have colonoscopy completed. -Placed a referral to orthopedic surgery for  second opinion due to back pain. Please call the office or send a MyChart message if you do not receive a phone call or a MyChart message about appointment in 2 weeks.  -Follow up in 6 months for a physical.

## 2024-06-23 NOTE — Assessment & Plan Note (Signed)
 Stable. Continue Amlodipine  5mg  daily, and HCTZ 25mg  daily. Refilled medication. Ordered CMP.

## 2024-06-23 NOTE — Progress Notes (Signed)
 Established Patient Office Visit   Subjective:  Patient ID: ITSEL OPFER, female    DOB: [birth year]  Age: 58 y.o. MRN: [id number]  Chief Complaint  Patient presents with   Medical Management of Chronic Issues    3 month follow up    HPI HTN: Chronic. Patient is prescribed Amlodipine  5mg  daily, and HCTZ 25mg  daily. She monitors her blood pressure at home, ranges between 100-120/70-80. Denies CP, HA, SHOB, dizziness, lightheadedness, or lower extremity edema. She previously has had to take potassium 20mEq daily for hypokalemia in the past.  BP Readings from Last 3 Encounters:  06/23/24 118/80  05/23/24 121/70  05/09/24 124/72    Hyperlipidemia: Chronic. Patient is prescribed Rosuvastatin  20mg  daily.  Denies any muscle and joint pain from medication.  Lab Results  Component Value Date   CHOL 155 01/20/2024   HDL 43 01/20/2024   LDLCALC 84 01/20/2024   TRIG 139 01/20/2024   CHOLHDL 3.6 01/20/2024    All other medications managed by specialist.   Patient is requesting a referral to orthopedic for a second opinion for her back pain. She is under the care [facility].  ROS See HPI above     Objective:   BP 118/80   Pulse 75   Temp 97.9 F (36.6 C) (Oral)   Ht 5' 4 (1.626 m)   Wt 215 lb (97.5 kg)   SpO2 97%   BMI 36.90 kg/m    Physical Exam Vitals reviewed.  Constitutional:      General: She is not in acute distress.    Appearance: Normal appearance. She is obese. She is not ill-appearing, toxic-appearing or diaphoretic.  HENT:     Head: Normocephalic and atraumatic.  Eyes:     General:        Right eye: No discharge.        Left eye: No discharge.     Conjunctiva/sclera: Conjunctivae normal.  Cardiovascular:     Rate and Rhythm: Normal rate and regular rhythm.     Heart sounds: Normal heart sounds. No murmur heard.    No friction rub. No gallop.  Pulmonary:     Effort: Pulmonary effort is normal. No respiratory distress.      Breath sounds: Normal breath sounds.  Musculoskeletal:        General: Normal range of motion.  Skin:    General: Skin is warm and dry.  Neurological:     General: No focal deficit present.     Mental Status: She is alert and oriented to person, place, and time. Mental status is at baseline.  Psychiatric:        Mood and Affect: Mood normal.        Behavior: Behavior normal.        Thought Content: Thought content normal.        Judgment: Judgment normal.      Assessment & Plan:  Essential hypertension, benign Assessment & Plan: Stable. Continue Amlodipine  5mg  daily, and HCTZ 25mg  daily. Refilled medication. Ordered CMP.   Orders: -     Comprehensive metabolic panel with GFR; Future -     amLODIPine  Besylate; Take 1 tablet (5 mg total) by mouth daily.  Dispense: 90 tablet; Refill: 1 -     hydroCHLOROthiazide ; Take 1 tablet (25 mg total) by mouth daily.  Dispense: 90 tablet; Refill: 1  Hyperlipidemia, unspecified hyperlipidemia type Assessment & Plan: Stable. Continue Rosuvastatin  20mg  daily. Refilled medication. Ordered CMP and lipid panel. Scheduling lab  appointment to have labs drawn when fasting.     Orders: -     Comprehensive metabolic panel with GFR; Future -     Lipid panel; Future -     Rosuvastatin  Calcium ; Take 1 tablet (20 mg total) by mouth daily.  Dispense: 90 tablet; Refill: 1  Lumbar facet joint syndrome -     Ambulatory referral to Orthopedic Surgery  Spondylosis of lumbar region without myelopathy or radiculopathy -     Ambulatory referral to Orthopedic Surgery  1.Review health maintenance:  -Cervical cancer screening: Partial hysterectomy; no cervix  -Colonoscopy: Needs to make appt. Last colonoscopy 09/14/2022 poor-reschedule  -Covid booster: Declines  -Tdap vaccine: Unknown, recommend  -Mammogram: [facility]- request records 09/22  -Zoster vaccine: Not had  -PNA vaccine: Not had  -Influenza vaccine: Declines  2 Placed a referral to  orthopedic surgery for  second opinion due to back pain. Please call the office or send a MyChart message if you do not receive a phone call or a MyChart message about appointment in 2 weeks. Return in about 6 months (around 12/21/2024) for physical; lab when fasting .   Kowen Kluth, NP

## 2024-06-23 NOTE — Assessment & Plan Note (Signed)
 Stable. Continue Rosuvastatin  20mg  daily. Refilled medication. Ordered CMP and lipid panel. Scheduling lab appointment to have labs drawn when fasting.

## 2024-06-26 ENCOUNTER — Ambulatory Visit: Admitting: Physical Medicine and Rehabilitation

## 2024-06-29 ENCOUNTER — Encounter: Payer: Self-pay | Admitting: Family Medicine

## 2024-06-30 ENCOUNTER — Telehealth: Payer: Self-pay

## 2024-06-30 ENCOUNTER — Telehealth (INDEPENDENT_AMBULATORY_CARE_PROVIDER_SITE_OTHER): Admitting: Family Medicine

## 2024-06-30 ENCOUNTER — Other Ambulatory Visit (HOSPITAL_COMMUNITY): Payer: Self-pay

## 2024-06-30 ENCOUNTER — Encounter: Payer: Self-pay | Admitting: Family Medicine

## 2024-06-30 VITALS — Ht 64.0 in | Wt 215.0 lb

## 2024-06-30 DIAGNOSIS — E66812 Obesity, class 2: Secondary | ICD-10-CM | POA: Diagnosis not present

## 2024-06-30 DIAGNOSIS — Z6836 Body mass index (BMI) 36.0-36.9, adult: Secondary | ICD-10-CM

## 2024-06-30 MED ORDER — SEMAGLUTIDE-WEIGHT MANAGEMENT 0.25 MG/0.5ML ~~LOC~~ SOAJ: 0.2500 mg | SUBCUTANEOUS | 0 refills | Status: DC

## 2024-06-30 NOTE — Patient Instructions (Signed)
-  It was good to see you.  -Prescribed Semaglutide 0.25mg  injection every 7 days for weeks. Provided general information about Semaglutide injection. -Discussed about common side effects and how to manage.  -Discussed about administering medication.  -Encourage changes in diet and participating in regular exercise to help with weight loss journey.  -Follow up in 1 month either telehealth or in person.

## 2024-06-30 NOTE — Telephone Encounter (Signed)
 Pharmacy Patient Advocate Encounter   Received notification from Onbase that prior authorization for Wegovy 0.25 is required/requested.   Insurance verification completed.   The patient is insured through [facility].   Per test claim: PA required; PA submitted to above mentioned insurance via Latent Key/confirmation #/EOC [id number] Status is pending

## 2024-06-30 NOTE — Telephone Encounter (Signed)
 Pharmacy Patient Advocate Encounter  Received notification from [facility] that Prior Authorization for Wegovy 0.25 has been APPROVED from 06/30/24 to 01/26/25. Ran test claim, Copay is $0.00. This test claim was processed through [facility]- copay amounts may vary at other pharmacies due to pharmacy/plan contracts, or as the patient moves through the different stages of their insurance plan.   PA #/Case ID/Reference #: # [id number]

## 2024-06-30 NOTE — Telephone Encounter (Signed)
 Notified pt.

## 2024-06-30 NOTE — Progress Notes (Signed)
 Virtual Visit via Video Note  I connected with Jackie Reed on 06/30/24 at 3:45 PM by a video enabled telemedicine application and verified that I am speaking with the correct person using two identifiers.   Patient Location: Home Provider Location: office - [place].    I discussed the limitations, risks, security and privacy concerns of performing an evaluation and management service by telephone and the availability of in person appointments. I also discussed with the patient that there may be a patient responsible charge related to this service. The patient expressed understanding and agreed to proceed, consent obtained  Chief Complaint  Patient presents with   Medical Management of Chronic Issues    Weight loss.     History of Present Illness: Jackie Reed is a 58 y.o. female that has been approved for [facility] to help lose weight and fight obesity. She is interested in knowing more about the medication and side effects.    Patient Active Problem List   Diagnosis Date Noted   Abnormal laboratory test 05/09/2024   Stroke-like symptom 05/04/2024   Left-sided weakness 05/04/2024   Hyperlipidemia 03/20/2024   Cognitive impairment 02/04/2024   Encephalopathy 01/28/2024   Encephalopathy, hypertensive 01/20/2024   Medication monitoring encounter 10/01/2023   TB lung, latent 08/27/2023   Sarcoidosis 04/22/2023   Adenopathy 02/09/2023   History of COVID-19 10/03/2020   Hepatitis B carrier (HCC) 07/03/2020   Mass of left hand 10/02/2019   Pre-diabetes 06/14/2019   Low back pain without sciatica 03/07/2019   Back muscle spasm 03/07/2019   History of recent fall 12/31/2018   Sprain of right ankle 11/18/2018   Class 2 severe obesity due to excess calories with serious comorbidity and body mass index (BMI) of 35.0 to 35.9 in adult 10/31/2018   Essential hypertension, benign 11/08/2015   Tendon calcification 11/08/2015   Adjustment disorder with mixed anxiety and depressed mood  11/08/2015   Insomnia 11/08/2015   Past Medical History:  Diagnosis Date   Adenomatous colon polyp 2017   Dyspnea    occasional   E-coli UTI 08/2019   Hepatitis 05/2020   Hepatitis B core antibody positive   Hypertension    Migraine    Plantar fibromatosis    PONV (postoperative nausea and vomiting)    Pre-diabetes    Sarcoidosis    Stroke (HCC)    TB lung, latent    Thyroid  nodule 01/29/2023   seen on PET scan   Vitamin B12 deficiency 06/2019   Vitamin D  deficiency 06/2019   Past Surgical History:  Procedure Laterality Date   ABDOMINAL HYSTERECTOMY     FINE NEEDLE ASPIRATION  02/19/2023   Procedure: FINE NEEDLE ASPIRATION (FNA) LINEAR;  Surgeon: Brenna Adine CROME, DO;  Location: [facility];  Service: Cardiopulmonary;;   FOOT SURGERY Bilateral 1985   MASS EXCISION Right 05/19/2018   Procedure: EXCISION PALMAR NODULES RIGHT HAND;  Surgeon: Murrell Drivers, MD;  Location: [facility];  Service: Orthopedics;  Laterality: Right;   PARTIAL HYSTERECTOMY  1997   VIDEO BRONCHOSCOPY WITH ENDOBRONCHIAL ULTRASOUND Bilateral 02/19/2023   Procedure: VIDEO BRONCHOSCOPY WITH ENDOBRONCHIAL ULTRASOUND;  Surgeon: Brenna Adine CROME, DO;  Location: [facility];  Service: Cardiopulmonary;  Laterality: Bilateral;   Allergies  Allergen Reactions   Amoxicillin  Palpitations and Shortness Of Breath    Other Reaction(s): Unknown   Morphine      Other Reaction(s): Unknown   Morphine  And Codeine Hives and Rash   Tramadol  Nausea And Vomiting    Other Reaction(s):  Unknown   Prior to Admission medications   Medication Sig Start Date End Date Taking? Authorizing Provider  acetaminophen  (TYLENOL ) 325 MG tablet Take 1-2 tablets (325-650 mg total) by mouth every 4 (four) hours as needed for mild pain (pain score 1-3). 02/07/24  Yes Jerilynn Daphne SAILOR, NP  amLODipine  (NORVASC ) 5 MG tablet Take 1 tablet (5 mg total) by mouth daily. 06/23/24  Yes Billy Philippe SAUNDERS, NP  aspirin  EC 81 MG tablet  Take 1 tablet (81 mg total) by mouth daily. Swallow whole. 03/06/24  Yes Raulkar, Sven SQUIBB, MD  baclofen  (LIORESAL ) 10 MG tablet Take 1 tablet (10 mg total) by mouth 3 (three) times daily as needed for muscle spasms. 02/07/24  Yes Jerilynn Daphne SAILOR, NP  diclofenac  Sodium (VOLTAREN ) 1 % GEL Apply 2 g topically 4 (four) times daily. 02/08/24  Yes Jerilynn Daphne SAILOR, NP  fluticasone  furoate-vilanterol (BREO ELLIPTA ) 100-25 MCG/ACT AEPB Inhale 1 puff into the lungs daily. 05/23/24  Yes Kassie Acquanetta Bradley, MD  hydrochlorothiazide  (HYDRODIURIL ) 25 MG tablet Take 1 tablet (25 mg total) by mouth daily. 06/23/24  Yes Billy Philippe SAUNDERS, NP  levalbuterol  (XOPENEX  HFA) 45 MCG/ACT inhaler Prn wheezing, cough  and shortness of breath from a cold 04/22/24  Yes Rodriguez-Southworth, Kyra, PA-C  promethazine -dextromethorphan (PROMETHAZINE -DM) 6.25-15 MG/5ML syrup Take 5 mLs by mouth 4 (four) times daily as needed for cough. 06/14/24  Yes Kassie Acquanetta Bradley, MD  rosuvastatin  (CRESTOR ) 20 MG tablet Take 1 tablet (20 mg total) by mouth daily. 06/23/24 09/21/24 Yes Billy Philippe SAUNDERS, NP   Social History   Socioeconomic History   Marital status: Married    Spouse name: Not on file   Number of children: 2   Years of education: Not on file   Highest education level: High school graduate  Occupational History   Not on file  Tobacco Use   Smoking status: Never   Smokeless tobacco: Never  Vaping Use   Vaping status: Never Used  Substance and Sexual Activity   Alcohol use: No    Alcohol/week: 0.0 standard drinks of alcohol   Drug use: No   Sexual activity: Yes    Birth control/protection: Surgical    Comment: Hysterectomy  Other Topics Concern   Not on file  Social History Narrative   Left handed   Lives at home with husband    Social Drivers of Health   Financial Resource Strain: Low Risk  (09/28/2023)   Received from [facility]   Overall Financial Resource Strain (CARDIA)    Difficulty of Paying  Living Expenses: Not very hard  Food Insecurity: No Food Insecurity (01/19/2024)   Hunger Vital Sign    Worried About Running Out of Food in the Last Year: Never true    Ran Out of Food in the Last Year: Never true  Transportation Needs: No Transportation Needs (01/19/2024)   PRAPARE - Administrator, Civil Service (Medical): No    Lack of Transportation (Non-Medical): No  Physical Activity: Insufficiently Active (03/20/2024)   Exercise Vital Sign    Days of Exercise per Week: 2 days    Minutes of Exercise per Session: 40 min  Stress: No Stress Concern Present (03/20/2024)   [employer] of Occupational [employer] - Occupational Stress Questionnaire    Feeling of Stress: Not at all  Social Connections: Unknown (12/22/2021)   Received from [facility]   Social Network    Social Network: Not on file  Intimate Partner Violence: Not At Risk (  01/19/2024)   Humiliation, Afraid, Rape, and Kick questionnaire    Fear of Current or Ex-Partner: No    Emotionally Abused: No    Physically Abused: No    Sexually Abused: No    Observations/Objective: Today's Vitals   06/30/24 1508  Weight: 215 lb (97.5 kg)  Height: 5' 4 (1.626 m)  Patient is unable to obtain vital signs with no equipment available.  Physical Exam Vitals reviewed.  Constitutional:      General: She is not in acute distress.    Appearance: Normal appearance. She is obese. She is not ill-appearing, toxic-appearing or diaphoretic.  HENT:     Head: Normocephalic and atraumatic.  Eyes:     General:        Right eye: No discharge.        Left eye: No discharge.     Conjunctiva/sclera: Conjunctivae normal.  Pulmonary:     Effort: Pulmonary effort is normal. No respiratory distress.  Skin:    General: Skin is dry.  Neurological:     General: No focal deficit present.     Mental Status: She is alert and oriented to person, place, and time. Mental status is at baseline.  Psychiatric:        Mood and Affect: Mood  normal.        Behavior: Behavior normal.        Thought Content: Thought content normal.        Judgment: Judgment normal.     Assessment and Plan: Class 2 severe obesity due to excess calories with serious comorbidity and body mass index (BMI) of 36.0 to 36.9 in adult -     Semaglutide-Weight Management; Inject 0.25 mg into the skin every 7 (seven) days.  Dispense: 2 mL; Refill: 0  -Prescribed Semaglutide 0.25mg  injection every 7 days for weeks. Provided general information about Semaglutide injection. -Discussed about common side effects and how to manage.  -Discussed about administering medication.  -Encourage changes in diet and participating in regular exercise to help with weight loss journey.   Follow Up Instructions: Return in about 1 month (around 07/30/2024) for follow-up.   I discussed the assessment and treatment plan with the patient. The patient was provided an opportunity to ask questions and all were answered. The patient agreed with the plan and demonstrated an understanding of the instructions.   The patient was advised to call back or seek an in-person evaluation if the symptoms worsen or if the condition fails to improve as anticipated.  Maniya Donovan, NP

## 2024-07-10 ENCOUNTER — Ambulatory Visit: Admitting: Family Medicine

## 2024-08-01 ENCOUNTER — Encounter: Payer: Self-pay | Attending: Physical Medicine and Rehabilitation | Admitting: Physical Medicine and Rehabilitation

## 2024-08-01 ENCOUNTER — Other Ambulatory Visit

## 2024-08-01 ENCOUNTER — Ambulatory Visit: Admitting: Family Medicine

## 2024-08-01 ENCOUNTER — Encounter: Payer: Self-pay | Admitting: Physical Medicine and Rehabilitation

## 2024-08-01 VITALS — BP 158/88 | HR 69 | Ht 64.0 in | Wt 212.4 lb

## 2024-08-01 DIAGNOSIS — I10 Essential (primary) hypertension: Secondary | ICD-10-CM | POA: Insufficient documentation

## 2024-08-01 DIAGNOSIS — Z6835 Body mass index (BMI) 35.0-35.9, adult: Secondary | ICD-10-CM | POA: Diagnosis present

## 2024-08-01 DIAGNOSIS — I639 Cerebral infarction, unspecified: Secondary | ICD-10-CM | POA: Diagnosis present

## 2024-08-01 DIAGNOSIS — E66812 Obesity, class 2: Secondary | ICD-10-CM | POA: Insufficient documentation

## 2024-08-01 DIAGNOSIS — G8929 Other chronic pain: Secondary | ICD-10-CM | POA: Insufficient documentation

## 2024-08-01 DIAGNOSIS — M545 Low back pain, unspecified: Secondary | ICD-10-CM | POA: Diagnosis present

## 2024-08-01 NOTE — Patient Instructions (Signed)
 HTN: Advised checking BP daily at home and logging results to bring into follow-up appointment with PCP and myself. -Reviewed BP meds today.  -Advised regarding healthy foods that can help lower blood pressure and provided with a list: 1) citrus foods- high in vitamins and minerals 2) salmon and other fatty fish - reduces inflammation and oxylipins 3) swiss chard (leafy green)- high level of nitrates 4) pumpkin seeds- one of the best natural sources of magnesium 5) Beans and lentils- high in fiber, magnesium, and potassium 6) Berries- high in flavonoids 7) Amaranth (whole grain, can be cooked similarly to rice and oats)- high in magnesium and fiber 8) Pistachios- even more effective at reducing BP than other nuts 9) Carrots- high in phenolic compounds that relax blood vessels and reduce inflammation 10) Celery- contain phthalides that relax tissues of arterial walls 11) Tomatoes- can also improve cholesterol and reduce risk of heart disease 12) Broccoli- good source of magnesium, calcium, and potassium 13) Greek yogurt: high in potassium and calcium 14) Herbs and spices: Celery seed, cilantro, saffron, lemongrass, black cumin, ginseng, cinnamon, cardamom, sweet basil, and ginger 15) Chia and flax seeds- also help to lower cholesterol and blood sugar 16) Beets- high levels of nitrates that relax blood vessels  17) spinach and bananas- high in potassium  -Provided lise of supplements that can help with hypertension:  1) magnesium: one high quality brand is Bioptemizers since it contains all 7 types of magnesium, otherwise over the counter magnesium gluconate 400mg  is a good option 2) B vitamins 3) vitamin D 4) potassium 5) CoQ10 6) L-arginine 7) Vitamin C 8) Beetroot -Educated that goal BP is 120/80. -Made goal to incorporate some of the above foods into diet.

## 2024-08-01 NOTE — Progress Notes (Signed)
 Subjective:    Patient ID: Jackie Reed, female    DOB: Jan 04, 1966, 58 y.o.   MRN: [id number]  HPI Jackie Reed is a 58 year old woman who presents for hospital follow-up.  1) CVA Graduated from PT and OT, SLP -feels like her memory has improved -continues on statin and aspirin  -has returned to work without issue -no longer needs assistive device  2) HTN: -BP is elevated today -she is not sure why -she does not check BP at home -she I on amlodipine  5mg  and hydrochlorothiazide  25mg   3) Obesity: -she has started on Wegovy  and has lost three pounds since she was last here  4) Low back pain: -she uses baclofen  as needed and just received a refill   Pain Inventory Average Pain 0 Pain Right Now 0 My pain is No pain  LOCATION OF PAIN  No pain  BOWEL Number of stools per week: 2 Oral laxative use Senokot  BLADDER Normal   Mobility walk with assistance use a walker how many minutes can you walk? 10-15 mins ability to climb steps?  yes do you drive?  no Do you have any goals in this area?  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  yes  Neuro/Psych trouble walking  Prior Studies Any changes since last visit?  no  Physicians involved in your care Any changes since last visit?  no   Family History  Problem Relation Age of Onset   Heart failure Mother    Diabetes Mother    Heart failure Father    Hypertension Father    Hyperlipidemia Father    Colon cancer Neg Hx    Colon polyps Neg Hx    Breast cancer Neg Hx    Crohn's disease Neg Hx    Esophageal cancer Neg Hx    Rectal cancer Neg Hx    Stomach cancer Neg Hx    Ulcerative colitis Neg Hx    Social History   Socioeconomic History   Marital status: Married    Spouse name: Not on file   Number of children: 2   Years of education: Not on file   Highest  education level: High school graduate  Occupational History   Not on file  Tobacco Use   Smoking status: Never   Smokeless tobacco: Never  Vaping Use   Vaping status: Never Used  Substance and Sexual Activity   Alcohol use: No    Alcohol/week: 0.0 standard drinks of alcohol   Drug use: No   Sexual activity: Yes    Birth control/protection: Surgical    Comment: Hysterectomy  Other Topics Concern   Not on file  Social History Narrative   Left handed   Lives at home with husband    Social Drivers of Health   Financial Resource Strain: Low Risk (09/28/2023)   Received from [facility]   Overall Financial Resource Strain (CARDIA)    Difficulty of Paying Living Expenses: Not very hard  Food Insecurity: No Food Insecurity (01/19/2024)   Hunger Vital Sign    Worried About Running Out of Food in the Last Year: Never true    Ran Out of Food in the Last Year: Never true  Transportation Needs: No Transportation Needs (01/19/2024)   PRAPARE - Transportation    Lack of Transportation (Medical): No    Lack  of Transportation (Non-Medical): No  Physical Activity: Insufficiently Active (03/20/2024)   Exercise Vital Sign    Days of Exercise per Week: 2 days    Minutes of Exercise per Session: 40 min  Stress: No Stress Concern Present (03/20/2024)   [employer] - Occupational Stress Questionnaire    Feeling of Stress: Not at all  Social Connections: Not on file   Past Surgical History:  Procedure Laterality Date   ABDOMINAL HYSTERECTOMY     FINE NEEDLE ASPIRATION  02/19/2023   Procedure: FINE NEEDLE ASPIRATION (FNA) LINEAR;  Surgeon: Brenna Adine CROME, DO;  Location: [facility];  Service: Cardiopulmonary;;   FOOT SURGERY Bilateral 1985   MASS EXCISION Right 05/19/2018   Procedure: EXCISION PALMAR NODULES RIGHT HAND;  Surgeon: Murrell Drivers, MD;  Location: [facility];  Service: Orthopedics;  Laterality: Right;   PARTIAL HYSTERECTOMY  1997   VIDEO  BRONCHOSCOPY WITH ENDOBRONCHIAL ULTRASOUND Bilateral 02/19/2023   Procedure: VIDEO BRONCHOSCOPY WITH ENDOBRONCHIAL ULTRASOUND;  Surgeon: Brenna Adine CROME, DO;  Location: [facility];  Service: Cardiopulmonary;  Laterality: Bilateral;   Past Medical History:  Diagnosis Date   Adenomatous colon polyp 2017   Dyspnea    occasional   E-coli UTI 08/2019   Hepatitis 05/2020   Hepatitis B core antibody positive   Hypertension    Migraine    Plantar fibromatosis    PONV (postoperative nausea and vomiting)    Pre-diabetes    Sarcoidosis    Stroke (HCC)    TB lung, latent    Thyroid  nodule 01/29/2023   seen on PET scan   Vitamin B12 deficiency 06/2019   Vitamin D  deficiency 06/2019   BP (!) 158/88 (BP Location: Left Arm, Patient Position: Sitting, Cuff Size: Normal)   Pulse 69   Ht 5' 4 (1.626 m)   Wt 212 lb 6.4 oz (96.3 kg)   SpO2 97%   BMI 36.46 kg/m   Opioid Risk Score:   Fall Risk Score:  `1  Depression screen [facility] 2/9     08/01/2024   10:39 AM 06/30/2024    3:08 PM 06/23/2024    3:31 PM 05/09/2024    1:59 PM 03/06/2024    3:35 PM 08/27/2023    9:06 AM 05/27/2020   11:09 AM  Depression screen PHQ 2/9  Decreased Interest 0 0 0 0 0 0 0  Down, Depressed, Hopeless 0 0 0 0 0 0 0  PHQ - 2 Score 0 0 0 0 0 0 0  Altered sleeping 0 0 0  0    Tired, decreased energy 0 0 0  0    Change in appetite 0 0 0  0    Feeling bad or failure about yourself  0 0 0  0    Trouble concentrating 0 0 0  0    Moving slowly or fidgety/restless 0 0 0  0    Suicidal thoughts 0 0 0  0    PHQ-9 Score 0 0 0   0     Difficult doing work/chores  Not difficult at all Not difficult at all         Data saved with a previous flowsheet row definition    Review of Systems  Musculoskeletal:  Positive for gait problem.  Neurological:  Negative for weakness.  All other systems reviewed and are negative.      Objective:   Physical Exam Gen: no distress, normal appearing HEENT: oral mucosa pink and moist,  NCAT Cardio: Reg rate Chest: normal effort, normal rate of breathing Abd: soft, non-distended Ext: no edema Psych: pleasant, normal affect Skin: intact Neuro: Alert and oriented x3, left sided weakness improved     Assessment & Plan:   1) CVA:  -continue aspirin  81 mg daily and statin -discussed that return to work has been going well  2) HTN: BP is 158/88 today -discussed that it would be beneficial to check blood pressure at home, that she can take two of her 5mg  tablets of amlodipine  if it is elevated -Advised checking BP daily at home and logging results to bring into follow-up appointment with PCP and myself. -Reviewed BP meds today.  -Advised regarding healthy foods that can help lower blood pressure and provided with a list: 1) citrus foods- high in vitamins and minerals 2) salmon and other fatty fish - reduces inflammation and oxylipins 3) swiss chard (leafy green)- high level of nitrates 4) pumpkin seeds- one of the best natural sources of magnesium  5) Beans and lentils- high in fiber, magnesium , and potassium 6) Berries- high in flavonoids 7) Amaranth (whole grain, can be cooked similarly to rice and oats)- high in magnesium  and fiber 8) Pistachios- even more effective at reducing BP than other nuts 9) Carrots- high in phenolic compounds that relax blood vessels and reduce inflammation 10) Celery- contain phthalides that relax tissues of arterial walls 11) Tomatoes- can also improve cholesterol and reduce risk of heart disease 12) Broccoli- good source of magnesium , calcium , and potassium 13) Greek yogurt: high in potassium and calcium  14) Herbs and spices: Celery seed, cilantro, saffron, lemongrass, black cumin, ginseng, cinnamon, cardamom, sweet basil, and ginger 15) Chia and flax seeds- also help to lower cholesterol and blood sugar 16) Beets- high levels of nitrates that relax blood vessels  17) spinach and bananas- high in potassium  -Provided lise of  supplements that can help with hypertension:  1) magnesium : one high quality brand is Bioptemizers since it contains all 7 types of magnesium , otherwise over the counter magnesium  gluconate 400mg  is a good option 2) B vitamins 3) vitamin D  4) potassium 5) CoQ10 6) L-arginine 7) Vitamin C 8) Beetroot -Educated that goal BP is 120/80. -Made goal to incorporate some of the above foods into diet.     3) Constipation:  -Provided list of following foods that help with constipation and highlighted a few: 1) prunes- contain high amounts of fiber.  2) apples- has a form of dietary fiber called pectin that accelerates stool movement and increases beneficial gut bacteria 3) pears- in addition to fiber, also high in fructose and sorbitol  which have laxative effect 4) figs- contain an enzyme ficin which helps to speed colonic transit 5) kiwis- contain an enzyme actinidin that improves gut motility and reduces constipation 6) oranges- rich in pectin (like apples) 7) grapefruits- contain a flavanol naringenin which has a laxative effect 8) vegetables- rich in fiber and also great sources of folate, vitamin C, and K 9) artichoke- high in inulin, prebiotic great for the microbiome 10) chicory- increases stool frequency and softness (can be added to coffee) 11) rhubarb- laxative effect 12) sweet potato- high fiber 13) beans, peas, and lentils- contain both soluble and insoluble fiber 14) chia seeds- improves intestinal health and gut flora 15) flaxseeds- laxative effect 16) whole grain rye bread- high in fiber 17) oat bran- high in soluble and insoluble fiber 18) kefir- softens stools -recommended to try at least one of these foods every day.  -drink 6-8 glasses  of water per day -walk regularly, especially after meals.   4) UPJ stenosis: -discussed that urology will follow-up with her on August 1st -discussed that she sometimes has muscle spasms and baclofen  helps  5) Obesity: -continue  Wegovy  -discussed that it is healthier to lose weight at a slow pace, this helps to prevent muscle loss and sagging skin_ -Educated regarding health benefits of weight loss- for pain, general health, chronic disease prevention, immune health, mental health.  -Will monitor weight every visit.  -Consider Roobois tea daily.  -Discussed the benefits of intermittent fasting. -Discussed foods that can assist in weight loss: 1) leafy greens- high in fiber and nutrients 2) dark chocolate- improves metabolism (if prefer sweetened, best to sweeten with honey instead of sugar).  3) cruciferous vegetables- high in fiber and protein 4) full fat yogurt: high in healthy fat, protein, calcium , and probiotics 5) apples- high in a variety of phytochemicals 6) nuts- high in fiber and protein that increase feelings of fullness 7) grapefruit: rich in nutrients, antioxidants, and fiber (not to be taken with anticoagulation) 8) beans- high in protein and fiber 9) salmon- has high quality protein and healthy fats 10) green tea- rich in polyphenols 11) eggs- rich in choline and vitamin D  12) tuna- high protein, boosts metabolism 13) avocado- decreases visceral abdominal fat 14) chicken (pasture raised): high in protein and iron 15) blueberries- reduce abdominal fat and cholesterol 16) whole grains- decreases calories retained during digestion, speeds metabolism 17) chia seeds- curb appetite 18) chilies- increases fat metabolism  -Discussed supplements that can be used:  1) Metatrim 400mg  BID 30 minutes before breakfast and dinner  2) Sphaeranthus indicus and Garcinia mangostana (combinations of these and #1 can be found in capsicum and zychrome  3) green coffee bean extract 400mg  twice per day or Irvingia (african mango) 150 to 300mg  twice per day.  6) Low back pain: -continue baclofen  TID prn

## 2024-08-07 ENCOUNTER — Other Ambulatory Visit: Payer: Self-pay

## 2024-08-07 ENCOUNTER — Other Ambulatory Visit

## 2024-08-07 ENCOUNTER — Encounter: Payer: Self-pay | Admitting: Family Medicine

## 2024-08-07 ENCOUNTER — Ambulatory Visit: Admitting: Family Medicine

## 2024-08-07 ENCOUNTER — Ambulatory Visit: Admitting: Orthopedic Surgery

## 2024-08-07 VITALS — BP 134/80 | HR 70 | Temp 98.2°F | Ht 64.0 in | Wt 210.0 lb

## 2024-08-07 DIAGNOSIS — I10 Essential (primary) hypertension: Secondary | ICD-10-CM | POA: Diagnosis not present

## 2024-08-07 DIAGNOSIS — E66812 Obesity, class 2: Secondary | ICD-10-CM

## 2024-08-07 DIAGNOSIS — E785 Hyperlipidemia, unspecified: Secondary | ICD-10-CM | POA: Diagnosis not present

## 2024-08-07 DIAGNOSIS — G8929 Other chronic pain: Secondary | ICD-10-CM

## 2024-08-07 DIAGNOSIS — Z6836 Body mass index (BMI) 36.0-36.9, adult: Secondary | ICD-10-CM | POA: Diagnosis not present

## 2024-08-07 LAB — COMPREHENSIVE METABOLIC PANEL WITH GFR
ALT: 8 U/L (ref 0–35)
AST: 19 U/L (ref 0–37)
Albumin: 4.1 g/dL (ref 3.5–5.2)
Alkaline Phosphatase: 127 U/L — ABNORMAL HIGH (ref 39–117)
BUN: 12 mg/dL (ref 6–23)
CO2: 23 meq/L (ref 19–32)
Calcium: 9.8 mg/dL (ref 8.4–10.5)
Chloride: 107 meq/L (ref 96–112)
Creatinine, Ser: 0.75 mg/dL (ref 0.40–1.20)
GFR: 87.16 mL/min (ref >60.00)
Glucose, Bld: 82 mg/dL (ref 70–99)
Potassium: 3.7 meq/L (ref 3.5–5.1)
Sodium: 140 meq/L (ref 135–145)
Total Bilirubin: 0.6 mg/dL (ref 0.2–1.2)
Total Protein: 7.7 g/dL (ref 6.0–8.3)

## 2024-08-07 LAB — LIPID PANEL
Cholesterol: 117 mg/dL (ref 0–200)
HDL: 44.4 mg/dL (ref >39.00)
LDL Cholesterol: 56 mg/dL (ref 0–99)
NonHDL: 72.81
Total CHOL/HDL Ratio: 3
Triglycerides: 84 mg/dL (ref 0.0–149.0)
VLDL: 16.8 mg/dL (ref 0.0–40.0)

## 2024-08-07 MED ORDER — CYCLOBENZAPRINE HCL 10 MG PO TABS: 10.0000 mg | ORAL_TABLET | Freq: Three times a day (TID) | ORAL | 0 refills | Status: AC | PRN

## 2024-08-07 MED ORDER — SEMAGLUTIDE-WEIGHT MANAGEMENT 0.5 MG/0.5ML ~~LOC~~ SOAJ: 0.5000 mg | SUBCUTANEOUS | 0 refills | Status: AC

## 2024-08-07 NOTE — Patient Instructions (Addendum)
-  It was great to see you today.  -Congratulations on your 5Ib weight loss. -Increased Semaglutide -Weight Management to 0.5mg  injection every 7 days for 4 doses.  -On the last dose, send a MyChart message with weight loss progress and if able to increase medication until reaching maximum dose of 2.4mg .  -Continue a healthy diet and participating in regular exercise. -Follow up in March for a physical and please be fasting. Discontinue May appointment.

## 2024-08-07 NOTE — Progress Notes (Signed)
° °  Established Patient Office Visit   Subjective:  Patient ID: Jackie Reed, female    DOB: 10-19-1965  Age: 58 y.o. MRN: [id number]  Chief Complaint  Patient presents with   Medical Management of Chronic Issues    1 month follow up    HPI Patient is following up on weight loss medication. Started Semaglutide  0.25mg  injection on 06/30/2024. She reports she has had minimal side effects. She reports when she initially started injections, she had some GI symptoms the first day or two.  Continuing with a healthy diet, a smaller portion amount, and staying active through excercising.  ROS See HPI above     Objective:   BP 134/80   Pulse 70   Temp 98.2 F (36.8 C) (Oral)   Ht 5' 4 (1.626 m)   Wt 210 lb (95.3 kg)   SpO2 96%   BMI 36.05 kg/m  Wt Readings from Last 3 Encounters:  08/07/24 210 lb (95.3 kg)  08/01/24 212 lb 6.4 oz (96.3 kg)  06/30/24 215 lb (97.5 kg)      Physical Exam Vitals reviewed.  Constitutional:      General: She is not in acute distress.    Appearance: Normal appearance. She is obese. She is not ill-appearing, toxic-appearing or diaphoretic.  HENT:     Head: Normocephalic and atraumatic.  Eyes:     General:        Right eye: No discharge.        Left eye: No discharge.     Conjunctiva/sclera: Conjunctivae normal.  Cardiovascular:     Rate and Rhythm: Normal rate and regular rhythm.     Heart sounds: Normal heart sounds. No murmur heard.    No friction rub. No gallop.  Pulmonary:     Effort: Pulmonary effort is normal. No respiratory distress.     Breath sounds: Normal breath sounds.  Musculoskeletal:        General: Normal range of motion.  Skin:    General: Skin is warm and dry.  Neurological:     General: No focal deficit present.     Mental Status: She is alert and oriented to person, place, and time. Mental status is at baseline.  Psychiatric:        Mood and Affect: Mood normal.        Behavior: Behavior normal.        Thought  Content: Thought content normal.        Judgment: Judgment normal.      Assessment & Plan:  Class 2 severe obesity due to excess calories with serious comorbidity and body mass index (BMI) of 36.0 to 36.9 in adult -     Semaglutide -Weight Management; Inject 0.5 mg into the skin every 7 (seven) days for 4 doses.  Dispense: 2 mL; Refill: 0  -Congratulated on her 5Ib weight loss. -Increased Semaglutide -Weight Management to 0.5mg  injection every 7 days for 4 doses.  -On the last dose, send a MyChart message with weight loss progress and if able to increase medication until reaching maximum dose of 2.4mg .  -Continue a healthy diet and participating in regular exercise.  Return in about 3 months (around 11/05/2024) for physical; discontinue May appointment .   Aarian Griffie, NP

## 2024-08-07 NOTE — Progress Notes (Signed)
 Orthopedic Spine Surgery Office Note  Assessment: Patient is a 58 y.o. female with chronic, low back pain.  No radicular symptoms   Plan: -Explained that initially conservative treatment is tried as a significant number of patients may experience relief with these treatment modalities. Discussed that the conservative treatments include:  -activity modification  -physical therapy  -over the counter pain medications  -medrol  dosepak  -lumbar steroid injections -Patient has tried PT, diclofenac , steroid injections -Recommended she continue with the diclofenac  since she is not helpful.  Prescribed Flexeril  for additional pain relief -Encouraged weight loss to potentially help with her pain -If she is not doing any better at her next visit, we will order an MRI of the lumbar spine to evaluate further -Patient should return to office in 6 weeks, x-rays at next visit: none   Patient expressed understanding of the plan and all questions were answered to the patient's satisfaction.   ___________________________________________________________________________   History:  Patient is a 59 y.o. female who presents today for lumbar spine.  Patient has had several years of low back pain.  She feels around the belt line.  She said that this started back in 2020 after she had a bad fall and landed directly on some cement.  Pain is been getting progressively worse with time.  She has no pain radiating into either lower extremity.  She notes the pain if she does any activity for too long.  That activity can be standing, sitting, lying down, doing dishes, etc.  Has tried multiple treatments over the years but has not noticed anything and provided her with relief.  She has seen several other spine surgeons.  Has never had any spine surgery.   Weakness: denies Symptoms of imbalance: denies Paresthesias and numbness: denies Bowel or bladder incontinence: denies Saddle anesthesia: denies  Treatments  tried: PT, diclofenac , steroid injections  Review of systems: Denies fevers and chills, night sweats, unexplained weight loss, history of cancer, pain that wakes her at night  Past medical history: HTN Pre-diabetes Migraines Sarcoidosis  Allergies: amoxicillin , morphine , tramadol   Past surgical history:  Hysterectomy Foot surgery Right hand mass excision  Social history: Denies use of nicotine product (smoking, vaping, patches, smokeless) Alcohol use: denies Denies recreational drug use   Physical Exam:  BMI of 36.1  General: no acute distress, appears stated age Neurologic: alert, answering questions appropriately, following commands Respiratory: unlabored breathing on room air, symmetric chest rise Psychiatric: appropriate affect, normal cadence to speech   MSK (spine):  -Strength exam      Left  Right EHL    5/5  5/5 TA    5/5  5/5 GSC    5/5  5/5 Knee extension  5/5  5/5 Hip flexion   5/5  5/5  -Sensory exam    Sensation intact to light touch in L3-S1 nerve distributions of bilateral lower extremities  -Achilles DTR: 1/4 on the left, 1/4 on the right -Patellar tendon DTR: 1/4 on the left, 1/4 on the right  -Straight leg raise: negative bilaterally -Clonus: no beats bilaterally  -Left hip exam: no pain through range of motion, negative FABER, negative stinchfield, negative SI joint compression test, negative gaenslens  -Right hip exam: no pain through range of motion, negative FABER, negative stinchfield, negative SI joint compression test, negative gaenslens   Imaging: XRs of the lumbar spine from 08/07/2024 were independently reviewed and interpreted, showing grade 1 spondylolisthesis at L4/5.  No other significant degenerative changes seen.  No fracture or dislocation seen.  Lordotic alignment.   MRI of the lumbar spine from 05/21/2024 was independently reviewed and interpreted, showing small central disc herniation at L5/S1. No significant stenosis  seen.    Patient name: Jackie Reed Patient MRN: [id number] Date of visit: 08/07/2024

## 2024-08-08 ENCOUNTER — Ambulatory Visit: Payer: Self-pay | Admitting: Family Medicine

## 2024-08-21 ENCOUNTER — Encounter: Payer: Self-pay | Admitting: Family Medicine

## 2024-08-21 DIAGNOSIS — E66812 Obesity, class 2: Secondary | ICD-10-CM

## 2024-08-21 MED ORDER — WEGOVY 1 MG/0.5ML ~~LOC~~ SOAJ: 1.0000 mg | SUBCUTANEOUS | 0 refills | Status: DC

## 2024-08-25 ENCOUNTER — Other Ambulatory Visit

## 2024-08-28 ENCOUNTER — Ambulatory Visit (HOSPITAL_BASED_OUTPATIENT_CLINIC_OR_DEPARTMENT_OTHER): Admitting: Pulmonary Disease

## 2024-09-18 ENCOUNTER — Encounter: Payer: Self-pay | Admitting: Family Medicine

## 2024-09-18 DIAGNOSIS — Z6836 Body mass index (BMI) 36.0-36.9, adult: Secondary | ICD-10-CM

## 2024-09-19 MED ORDER — WEGOVY 25 MG PO TABS: 25.0000 mg | ORAL_TABLET | Freq: Every day | ORAL | 2 refills | Status: AC

## 2024-09-21 ENCOUNTER — Ambulatory Visit: Admitting: Orthopedic Surgery

## 2024-09-21 DIAGNOSIS — M47816 Spondylosis without myelopathy or radiculopathy, lumbar region: Secondary | ICD-10-CM

## 2024-09-22 NOTE — Progress Notes (Addendum)
 Orthopedic Spine Surgery Office Note   Assessment: Patient is a 59 y.o. female with chronic, low back pain.  No radicular symptoms     Plan: -Patient has tried PT, diclofenac , steroid injections, cyclobenzaprine  -Patient was previously seen by Dr. Waylon and has gotten injections with him in the past.  I cannot see those injections since it is out of the [facility] network.  I saw a CT abdomen pelvis with significant facet arthropathy at L4/5.  Recommended diagnostic/therapeutic L4/5 facet injections.  If she does well with those, could consider RFA in the future -If these injections do not work, would order a SPECT scan to evaluate further -Patient should return to office on an as-needed basis     Patient expressed understanding of the plan and all questions were answered to the patient's satisfaction.    ___________________________________________________________________________     History:   Patient is a 59 y.o. female who presents today for follow-up on her lumbar spine.  Patient continues to have low back pain.  She has no radicular symptoms.  Her symptoms are unchanged since she was last seen in the office.  She feels it in the lower lumbar region.  She has had this pain now for 6 years.  Has not found any treatments to provide her with lasting relief.   Treatments tried: PT, diclofenac , steroid injections, cyclobenzaprine      Physical Exam:   General: no acute distress, appears stated age Neurologic: alert, answering questions appropriately, following commands Respiratory: unlabored breathing on room air, symmetric chest rise Psychiatric: appropriate affect, normal cadence to speech     MSK (spine):   -Strength exam                                                   Left                  Right EHL                              5/5                  5/5 TA                                 5/5                  5/5 GSC                             5/5                  5/5 Knee  extension            5/5                  5/5 Hip flexion                    5/5                  5/5   -Sensory exam  Sensation intact to light touch in L3-S1 nerve distributions of bilateral lower extremities    Imaging: XRs of the lumbar spine from 08/07/2024 were previously independently reviewed and interpreted, showing grade 1 spondylolisthesis at L4/5.  No other significant degenerative changes seen.  No fracture or dislocation seen. Lordotic alignment.     Patient name: Jackie Reed Patient MRN: [id number] Date of visit: 09/21/2024

## 2024-10-23 ENCOUNTER — Other Ambulatory Visit

## 2024-10-23 ENCOUNTER — Encounter: Admitting: Family Medicine

## 2024-12-22 ENCOUNTER — Encounter: Admitting: Family Medicine
